# Patient Record
Sex: Male | Born: 1960 | Race: White | Hispanic: No | Marital: Married | Smoking: Former smoker
Health system: Southern US, Community
[De-identification: ages and names within clinical notes are randomized; demographics above are authoritative.]

## PROBLEM LIST (undated history)

## (undated) DIAGNOSIS — E785 Hyperlipidemia, unspecified: Secondary | ICD-10-CM

## (undated) DIAGNOSIS — Z9049 Acquired absence of other specified parts of digestive tract: Secondary | ICD-10-CM

## (undated) DIAGNOSIS — G894 Chronic pain syndrome: Secondary | ICD-10-CM

## (undated) DIAGNOSIS — K219 Gastro-esophageal reflux disease without esophagitis: Secondary | ICD-10-CM

## (undated) DIAGNOSIS — M51379 Other intervertebral disc degeneration, lumbosacral region without mention of lumbar back pain or lower extremity pain: Secondary | ICD-10-CM

## (undated) DIAGNOSIS — M1990 Unspecified osteoarthritis, unspecified site: Secondary | ICD-10-CM

## (undated) DIAGNOSIS — M5137 Other intervertebral disc degeneration, lumbosacral region: Secondary | ICD-10-CM

## (undated) DIAGNOSIS — G8918 Other acute postprocedural pain: Secondary | ICD-10-CM

## (undated) HISTORY — DX: Other intervertebral disc degeneration, lumbosacral region: M51.37

## (undated) HISTORY — DX: Hyperlipidemia, unspecified: E78.5

## (undated) HISTORY — DX: Acquired absence of other specified parts of digestive tract: Z90.49

## (undated) HISTORY — PX: APPENDECTOMY: SHX54

## (undated) HISTORY — DX: Unspecified osteoarthritis, unspecified site: M19.90

## (undated) HISTORY — PX: OTHER SURGICAL HISTORY: SHX169

## (undated) HISTORY — DX: Gastro-esophageal reflux disease without esophagitis: K21.9

## (undated) HISTORY — DX: Other intervertebral disc degeneration, lumbosacral region without mention of lumbar back pain or lower extremity pain: M51.379

## (undated) HISTORY — PX: SPINAL FUSION: SHX223

## (undated) HISTORY — DX: Other acute postprocedural pain: G89.18

## (undated) HISTORY — DX: Chronic pain syndrome: G89.4

## (undated) HISTORY — PX: SPINAL CORD STIMULATOR IMPLANT: SHX2422

---

## 1998-05-19 ENCOUNTER — Other Ambulatory Visit: Admission: RE | Admit: 1998-05-19 | Discharge: 1998-05-19 | Payer: Self-pay | Admitting: Gastroenterology

## 2000-03-07 ENCOUNTER — Inpatient Hospital Stay (HOSPITAL_COMMUNITY): Admission: RE | Admit: 2000-03-07 | Discharge: 2000-03-10 | Payer: Self-pay | Admitting: Neurosurgery

## 2000-03-07 ENCOUNTER — Encounter: Payer: Self-pay | Admitting: Neurosurgery

## 2000-04-06 ENCOUNTER — Ambulatory Visit (HOSPITAL_COMMUNITY): Admission: RE | Admit: 2000-04-06 | Discharge: 2000-04-06 | Payer: Self-pay | Admitting: Neurosurgery

## 2000-04-06 ENCOUNTER — Encounter: Payer: Self-pay | Admitting: Neurosurgery

## 2000-06-13 ENCOUNTER — Encounter: Payer: Self-pay | Admitting: Neurosurgery

## 2000-06-13 ENCOUNTER — Ambulatory Visit (HOSPITAL_COMMUNITY): Admission: RE | Admit: 2000-06-13 | Discharge: 2000-06-13 | Payer: Self-pay | Admitting: Neurosurgery

## 2000-08-22 ENCOUNTER — Ambulatory Visit (HOSPITAL_COMMUNITY): Admission: RE | Admit: 2000-08-22 | Discharge: 2000-08-22 | Payer: Self-pay | Admitting: Neurosurgery

## 2000-08-22 ENCOUNTER — Encounter: Payer: Self-pay | Admitting: Neurosurgery

## 2001-05-23 ENCOUNTER — Encounter: Payer: Self-pay | Admitting: Gastroenterology

## 2002-04-25 ENCOUNTER — Ambulatory Visit (HOSPITAL_COMMUNITY): Admission: RE | Admit: 2002-04-25 | Discharge: 2002-04-25 | Payer: Self-pay

## 2004-03-04 ENCOUNTER — Ambulatory Visit: Payer: Self-pay | Admitting: Family Medicine

## 2004-05-06 ENCOUNTER — Ambulatory Visit: Payer: Self-pay | Admitting: Family Medicine

## 2004-05-13 ENCOUNTER — Ambulatory Visit: Payer: Self-pay | Admitting: Family Medicine

## 2005-02-09 ENCOUNTER — Ambulatory Visit: Payer: Self-pay | Admitting: Family Medicine

## 2005-03-14 ENCOUNTER — Ambulatory Visit: Payer: Self-pay | Admitting: Family Medicine

## 2005-05-12 ENCOUNTER — Ambulatory Visit: Payer: Self-pay | Admitting: Family Medicine

## 2005-05-19 ENCOUNTER — Ambulatory Visit: Payer: Self-pay | Admitting: Family Medicine

## 2005-11-04 ENCOUNTER — Ambulatory Visit: Payer: Self-pay | Admitting: Family Medicine

## 2006-02-15 ENCOUNTER — Encounter (INDEPENDENT_AMBULATORY_CARE_PROVIDER_SITE_OTHER): Payer: Self-pay | Admitting: Specialist

## 2006-02-15 ENCOUNTER — Ambulatory Visit (HOSPITAL_BASED_OUTPATIENT_CLINIC_OR_DEPARTMENT_OTHER): Admission: RE | Admit: 2006-02-15 | Discharge: 2006-02-15 | Payer: Self-pay | Admitting: Otolaryngology

## 2006-06-07 ENCOUNTER — Ambulatory Visit: Payer: Self-pay | Admitting: Family Medicine

## 2006-06-07 LAB — CONVERTED CEMR LAB
ALT: 19 units/L (ref 0–40)
AST: 25 units/L (ref 0–37)
Albumin: 3.8 g/dL (ref 3.5–5.2)
Alkaline Phosphatase: 85 units/L (ref 39–117)
BUN: 19 mg/dL (ref 6–23)
Basophils Absolute: 0 10*3/uL (ref 0.0–0.1)
Basophils Relative: 0.6 % (ref 0.0–1.0)
Bilirubin, Direct: 0.1 mg/dL (ref 0.0–0.3)
CO2: 31 meq/L (ref 19–32)
Calcium: 9 mg/dL (ref 8.4–10.5)
Chloride: 103 meq/L (ref 96–112)
Cholesterol: 198 mg/dL (ref 0–200)
Creatinine, Ser: 0.8 mg/dL (ref 0.4–1.5)
Eosinophils Absolute: 0.1 10*3/uL (ref 0.0–0.6)
Eosinophils Relative: 3 % (ref 0.0–5.0)
GFR calc Af Amer: 135 mL/min
GFR calc non Af Amer: 112 mL/min
Glucose, Bld: 95 mg/dL (ref 70–99)
HCT: 37.5 % — ABNORMAL LOW (ref 39.0–52.0)
HDL: 35.2 mg/dL — ABNORMAL LOW (ref >39.0)
Hemoglobin: 13.1 g/dL (ref 13.0–17.0)
LDL Cholesterol: 137 mg/dL — ABNORMAL HIGH (ref 0–99)
Lymphocytes Relative: 28.4 % (ref 12.0–46.0)
MCHC: 35 g/dL (ref 30.0–36.0)
MCV: 83.1 fL (ref 78.0–100.0)
Monocytes Absolute: 0.3 10*3/uL (ref 0.2–0.7)
Monocytes Relative: 7.7 % (ref 3.0–11.0)
Neutro Abs: 2.5 10*3/uL (ref 1.4–7.7)
Neutrophils Relative %: 60.3 % (ref 43.0–77.0)
Platelets: 181 10*3/uL (ref 150–400)
Potassium: 3.6 meq/L (ref 3.5–5.1)
RBC: 4.51 M/uL (ref 4.22–5.81)
RDW: 12.9 % (ref 11.5–14.6)
Sodium: 142 meq/L (ref 135–145)
TSH: 2.9 microintl units/mL (ref 0.35–5.50)
Total Bilirubin: 0.6 mg/dL (ref 0.3–1.2)
Total CHOL/HDL Ratio: 5.6
Total Protein: 6.5 g/dL (ref 6.0–8.3)
Triglycerides: 127 mg/dL (ref 0–149)
VLDL: 25 mg/dL (ref 0–40)
WBC: 4.1 10*3/uL — ABNORMAL LOW (ref 4.5–10.5)

## 2006-06-12 ENCOUNTER — Ambulatory Visit: Payer: Self-pay | Admitting: Family Medicine

## 2006-09-07 ENCOUNTER — Encounter: Admission: RE | Admit: 2006-09-07 | Discharge: 2006-09-07 | Payer: Self-pay | Admitting: Otolaryngology

## 2007-03-07 ENCOUNTER — Ambulatory Visit: Payer: Self-pay | Admitting: Gastroenterology

## 2007-03-13 ENCOUNTER — Ambulatory Visit (HOSPITAL_COMMUNITY): Admission: RE | Admit: 2007-03-13 | Discharge: 2007-03-13 | Payer: Self-pay | Admitting: Gastroenterology

## 2007-03-16 ENCOUNTER — Ambulatory Visit: Payer: Self-pay | Admitting: Gastroenterology

## 2007-03-27 DIAGNOSIS — G8929 Other chronic pain: Secondary | ICD-10-CM | POA: Insufficient documentation

## 2007-03-27 DIAGNOSIS — K219 Gastro-esophageal reflux disease without esophagitis: Secondary | ICD-10-CM | POA: Insufficient documentation

## 2007-03-27 DIAGNOSIS — R1310 Dysphagia, unspecified: Secondary | ICD-10-CM | POA: Insufficient documentation

## 2007-03-27 DIAGNOSIS — M5442 Lumbago with sciatica, left side: Secondary | ICD-10-CM

## 2007-03-27 DIAGNOSIS — J309 Allergic rhinitis, unspecified: Secondary | ICD-10-CM | POA: Insufficient documentation

## 2007-03-27 DIAGNOSIS — M545 Low back pain, unspecified: Secondary | ICD-10-CM | POA: Insufficient documentation

## 2007-04-30 DIAGNOSIS — J069 Acute upper respiratory infection, unspecified: Secondary | ICD-10-CM | POA: Insufficient documentation

## 2007-05-04 ENCOUNTER — Ambulatory Visit: Payer: Self-pay | Admitting: Family Medicine

## 2007-11-20 ENCOUNTER — Ambulatory Visit: Payer: Self-pay | Admitting: Family Medicine

## 2007-11-20 DIAGNOSIS — L259 Unspecified contact dermatitis, unspecified cause: Secondary | ICD-10-CM | POA: Insufficient documentation

## 2007-12-07 ENCOUNTER — Telehealth: Payer: Self-pay | Admitting: Family Medicine

## 2007-12-24 ENCOUNTER — Ambulatory Visit: Payer: Self-pay | Admitting: Family Medicine

## 2007-12-24 LAB — CONVERTED CEMR LAB
ALT: 16 units/L (ref 0–53)
AST: 19 units/L (ref 0–37)
Albumin: 4.2 g/dL (ref 3.5–5.2)
Alkaline Phosphatase: 74 units/L (ref 39–117)
BUN: 24 mg/dL — ABNORMAL HIGH (ref 6–23)
Basophils Absolute: 0 10*3/uL (ref 0.0–0.1)
Basophils Relative: 0.7 % (ref 0.0–3.0)
Bilirubin Urine: NEGATIVE
Bilirubin, Direct: 0.1 mg/dL (ref 0.0–0.3)
Blood in Urine, dipstick: NEGATIVE
CO2: 34 meq/L — ABNORMAL HIGH (ref 19–32)
Calcium: 9.1 mg/dL (ref 8.4–10.5)
Chloride: 110 meq/L (ref 96–112)
Cholesterol: 218 mg/dL (ref 0–200)
Creatinine, Ser: 0.9 mg/dL (ref 0.4–1.5)
Direct LDL: 172.8 mg/dL
Eosinophils Absolute: 0.1 10*3/uL (ref 0.0–0.7)
Eosinophils Relative: 2 % (ref 0.0–5.0)
GFR calc Af Amer: 117 mL/min
GFR calc non Af Amer: 97 mL/min
Glucose, Bld: 101 mg/dL — ABNORMAL HIGH (ref 70–99)
Glucose, Urine, Semiquant: NEGATIVE
HCT: 41.2 % (ref 39.0–52.0)
HDL: 43.9 mg/dL (ref >39.0)
Hemoglobin: 14 g/dL (ref 13.0–17.0)
Lymphocytes Relative: 31.2 % (ref 12.0–46.0)
MCHC: 34 g/dL (ref 30.0–36.0)
MCV: 89.1 fL (ref 78.0–100.0)
Monocytes Absolute: 0.2 10*3/uL (ref 0.1–1.0)
Monocytes Relative: 5.2 % (ref 3.0–12.0)
Neutro Abs: 2.8 10*3/uL (ref 1.4–7.7)
Neutrophils Relative %: 60.9 % (ref 43.0–77.0)
Nitrite: NEGATIVE
Platelets: 225 10*3/uL (ref 150–400)
Potassium: 3.9 meq/L (ref 3.5–5.1)
Protein, U semiquant: NEGATIVE
RBC: 4.62 M/uL (ref 4.22–5.81)
RDW: 13.3 % (ref 11.5–14.6)
Sodium: 146 meq/L — ABNORMAL HIGH (ref 135–145)
Specific Gravity, Urine: 1.025
TSH: 3.73 microintl units/mL (ref 0.35–5.50)
Total Bilirubin: 0.4 mg/dL (ref 0.3–1.2)
Total CHOL/HDL Ratio: 5
Total Protein: 7 g/dL (ref 6.0–8.3)
Triglycerides: 88 mg/dL (ref 0–149)
Urobilinogen, UA: 0.2
VLDL: 18 mg/dL (ref 0–40)
WBC Urine, dipstick: NEGATIVE
WBC: 4.5 10*3/uL (ref 4.5–10.5)
pH: 5.5

## 2008-01-02 ENCOUNTER — Ambulatory Visit: Payer: Self-pay | Admitting: Family Medicine

## 2008-01-02 DIAGNOSIS — E785 Hyperlipidemia, unspecified: Secondary | ICD-10-CM | POA: Insufficient documentation

## 2008-01-14 ENCOUNTER — Telehealth: Payer: Self-pay | Admitting: *Deleted

## 2008-02-25 ENCOUNTER — Ambulatory Visit: Payer: Self-pay | Admitting: Family Medicine

## 2008-02-25 LAB — CONVERTED CEMR LAB
ALT: 23 U/L (ref 0–53)
AST: 26 U/L (ref 0–37)
Albumin: 4.1 g/dL (ref 3.5–5.2)
Alkaline Phosphatase: 61 U/L (ref 39–117)
Bilirubin, Direct: 0.1 mg/dL (ref 0.0–0.3)
Cholesterol: 148 mg/dL (ref 0–200)
HDL: 32.3 mg/dL — ABNORMAL LOW (ref >39.0)
LDL Cholesterol: 82 mg/dL (ref 0–99)
Total Bilirubin: 0.5 mg/dL (ref 0.3–1.2)
Total CHOL/HDL Ratio: 4.6
Total Protein: 6.7 g/dL (ref 6.0–8.3)
Triglycerides: 170 mg/dL — ABNORMAL HIGH (ref 0–149)
VLDL: 34 mg/dL (ref 0–40)

## 2008-03-03 ENCOUNTER — Ambulatory Visit: Payer: Self-pay | Admitting: Family Medicine

## 2008-08-11 ENCOUNTER — Emergency Department (HOSPITAL_COMMUNITY): Admission: EM | Admit: 2008-08-11 | Discharge: 2008-08-11 | Payer: Self-pay | Admitting: Emergency Medicine

## 2008-08-14 ENCOUNTER — Ambulatory Visit: Payer: Self-pay | Admitting: Family Medicine

## 2009-01-09 ENCOUNTER — Ambulatory Visit: Payer: Self-pay | Admitting: Family Medicine

## 2009-01-09 LAB — CONVERTED CEMR LAB
ALT: 22 units/L (ref 0–53)
AST: 28 units/L (ref 0–37)
Albumin: 4.3 g/dL (ref 3.5–5.2)
Alkaline Phosphatase: 65 units/L (ref 39–117)
BUN: 18 mg/dL (ref 6–23)
Basophils Absolute: 0 10*3/uL (ref 0.0–0.1)
Basophils Relative: 0.5 % (ref 0.0–3.0)
Bilirubin Urine: NEGATIVE
Bilirubin, Direct: 0 mg/dL (ref 0.0–0.3)
Blood in Urine, dipstick: NEGATIVE
CO2: 30 meq/L (ref 19–32)
Calcium: 9.3 mg/dL (ref 8.4–10.5)
Chloride: 104 meq/L (ref 96–112)
Cholesterol: 132 mg/dL (ref 0–200)
Creatinine, Ser: 0.9 mg/dL (ref 0.4–1.5)
Eosinophils Absolute: 0.1 10*3/uL (ref 0.0–0.7)
Eosinophils Relative: 2.1 % (ref 0.0–5.0)
GFR calc non Af Amer: 96.08 mL/min (ref >60)
Glucose, Bld: 111 mg/dL — ABNORMAL HIGH (ref 70–99)
Glucose, Urine, Semiquant: NEGATIVE
HCT: 38.8 % — ABNORMAL LOW (ref 39.0–52.0)
HDL: 35.5 mg/dL — ABNORMAL LOW (ref >39.00)
Hemoglobin: 13.2 g/dL (ref 13.0–17.0)
LDL Cholesterol: 85 mg/dL (ref 0–99)
Lymphocytes Relative: 29.1 % (ref 12.0–46.0)
Lymphs Abs: 1.2 10*3/uL (ref 0.7–4.0)
MCHC: 33.9 g/dL (ref 30.0–36.0)
MCV: 86.9 fL (ref 78.0–100.0)
Monocytes Absolute: 0.3 10*3/uL (ref 0.1–1.0)
Monocytes Relative: 6.9 % (ref 3.0–12.0)
Neutro Abs: 2.4 10*3/uL (ref 1.4–7.7)
Neutrophils Relative %: 61.4 % (ref 43.0–77.0)
Nitrite: NEGATIVE
Platelets: 168 10*3/uL (ref 150.0–400.0)
Potassium: 4.5 meq/L (ref 3.5–5.1)
Protein, U semiquant: NEGATIVE
RBC: 4.47 M/uL (ref 4.22–5.81)
RDW: 12.8 % (ref 11.5–14.6)
Sodium: 142 meq/L (ref 135–145)
Specific Gravity, Urine: 1.02
TSH: 1.04 microintl units/mL (ref 0.35–5.50)
Total Bilirubin: 0.7 mg/dL (ref 0.3–1.2)
Total CHOL/HDL Ratio: 4
Total Protein: 6.9 g/dL (ref 6.0–8.3)
Triglycerides: 60 mg/dL (ref 0.0–149.0)
Urobilinogen, UA: 1
VLDL: 12 mg/dL (ref 0.0–40.0)
WBC Urine, dipstick: NEGATIVE
WBC: 4 10*3/uL — ABNORMAL LOW (ref 4.5–10.5)
pH: 6.5

## 2009-01-16 ENCOUNTER — Ambulatory Visit: Payer: Self-pay | Admitting: Family Medicine

## 2009-01-22 ENCOUNTER — Telehealth: Payer: Self-pay | Admitting: Family Medicine

## 2010-01-12 ENCOUNTER — Ambulatory Visit: Payer: Self-pay | Admitting: Family Medicine

## 2010-01-12 LAB — CONVERTED CEMR LAB
ALT: 17 units/L (ref 0–53)
AST: 22 units/L (ref 0–37)
Albumin: 4.2 g/dL (ref 3.5–5.2)
Alkaline Phosphatase: 69 units/L (ref 39–117)
BUN: 18 mg/dL (ref 6–23)
Basophils Absolute: 0 10*3/uL (ref 0.0–0.1)
Basophils Relative: 0.8 % (ref 0.0–3.0)
Bilirubin Urine: NEGATIVE
Bilirubin, Direct: 0.1 mg/dL (ref 0.0–0.3)
Blood in Urine, dipstick: NEGATIVE
CO2: 32 meq/L (ref 19–32)
Calcium: 9.4 mg/dL (ref 8.4–10.5)
Chloride: 106 meq/L (ref 96–112)
Cholesterol: 178 mg/dL (ref 0–200)
Creatinine, Ser: 0.8 mg/dL (ref 0.4–1.5)
Eosinophils Absolute: 0.1 10*3/uL (ref 0.0–0.7)
Eosinophils Relative: 2.1 % (ref 0.0–5.0)
GFR calc non Af Amer: 103.59 mL/min (ref >60)
Glucose, Bld: 96 mg/dL (ref 70–99)
Glucose, Urine, Semiquant: NEGATIVE
HCT: 39 % (ref 39.0–52.0)
HDL: 35.7 mg/dL — ABNORMAL LOW (ref >39.00)
Hemoglobin: 13.3 g/dL (ref 13.0–17.0)
LDL Cholesterol: 128 mg/dL — ABNORMAL HIGH (ref 0–99)
Lymphocytes Relative: 31.3 % (ref 12.0–46.0)
Lymphs Abs: 1.1 10*3/uL (ref 0.7–4.0)
MCHC: 34.2 g/dL (ref 30.0–36.0)
MCV: 86.9 fL (ref 78.0–100.0)
Monocytes Absolute: 0.3 10*3/uL (ref 0.1–1.0)
Monocytes Relative: 8.1 % (ref 3.0–12.0)
Neutro Abs: 2.1 10*3/uL (ref 1.4–7.7)
Neutrophils Relative %: 57.7 % (ref 43.0–77.0)
Nitrite: NEGATIVE
PSA: 0.52 ng/mL (ref 0.10–4.00)
Platelets: 171 10*3/uL (ref 150.0–400.0)
Potassium: 4.3 meq/L (ref 3.5–5.1)
Protein, U semiquant: NEGATIVE
RBC: 4.48 M/uL (ref 4.22–5.81)
RDW: 13.7 % (ref 11.5–14.6)
Sodium: 142 meq/L (ref 135–145)
Specific Gravity, Urine: 1.025
TSH: 1.26 microintl units/mL (ref 0.35–5.50)
Total Bilirubin: 0.5 mg/dL (ref 0.3–1.2)
Total CHOL/HDL Ratio: 5
Total Protein: 6.5 g/dL (ref 6.0–8.3)
Triglycerides: 73 mg/dL (ref 0.0–149.0)
Urobilinogen, UA: 0.2
VLDL: 14.6 mg/dL (ref 0.0–40.0)
WBC Urine, dipstick: NEGATIVE
WBC: 3.6 10*3/uL — ABNORMAL LOW (ref 4.5–10.5)
pH: 6.5

## 2010-01-18 ENCOUNTER — Encounter: Payer: Self-pay | Admitting: Family Medicine

## 2010-01-18 ENCOUNTER — Ambulatory Visit: Payer: Self-pay | Admitting: Family Medicine

## 2010-04-29 NOTE — Assessment & Plan Note (Signed)
Summary: gout in toe/mhf   Vital Signs:  Patient profile:   50 year old male Height:      71 inches Weight:      230 pounds BMI:     32.19 Temp:     98.1 degrees F oral BP sitting:   110 / 80  (left arm) Cuff size:   regular  Vitals Entered By: Kern Reap CMA (Aug 14, 2008 12:41 PM)  Reason for Visit gout  History of Present Illness: Lance Roman is a 50 y/o male  who is evaluation of pain in his left great toe.  On May 13 he awoke with pain in his left great toe.  He thinks he stubbed it during the night.  By May, the 18th his pain was worse he couldn't stand the sheets to touch.  It.  A subsequent to urgent care x-rays show no fracture.  Uric acid level was drawn, which comes back 6.4.  He was given indomethacin 50 mg 3 times a day with food and says his pain is about 50 to 75% diminished.  Allergies (verified): No Known Drug Allergies  Past History:  Past medical, surgical, family and social histories (including risk factors) reviewed, and no changes noted (except as noted below).  Past Medical History:    Reviewed history from 01/02/2008 and no changes required:    chronic pain syndrome    osteoarthritis    allergic rhinitis    gerd    appendectomy    lumbosacral degenerative disease    ENT surgery, benign polyp, right maxillary sinus    Hyperlipidemia  Past Surgical History:    Reviewed history from 11/20/2007 and no changes required:    Appendectomy    repairs of bilateral ankle fractures  Family History:    Reviewed history from 05/04/2007 and no changes required:       father and mother both alive and well       two brothers in good health.  No problems  Social History:    Reviewed history from 05/04/2007 and no changes required:       Occupation:       Married       Former Smoker       Alcohol use-no       Drug use-no       Regular exercise-no  Review of Systems      See HPI  Physical Exam  General:  Well-developed,well-nourished,in no acute  distress; alert,appropriate and cooperative throughout examination Msk:  exam normal, except for swelling and redness proximal joint, left great Pulses:  R and L carotid,radial,femoral,dorsalis pedis and posterior tibial pulses are full and equal bilaterally Extremities:  No clubbing, cyanosis, edema, or deformity noted with normal full range of motion of all joints.   Neurologic:  No cranial nerve deficits noted. Station and gait are normal. Plantar reflexes are down-going bilaterally. DTRs are symmetrical throughout. Sensory, motor and coordinative functions appear intact.   Impression & Recommendations:  Problem # 1:  GOUT, UNSPECIFIED (ICD-274.9) Assessment New  Orders: Prescription Created Electronically [id number]  Complete Medication List: 1)  Nexium 40 Mg Cpdr (Esomeprazole magnesium) .... Once daily 2)  Duragesic-75 75 Mcg/hr Pt72 (Fentanyl) .... As dir 3)  Oxyir 5 Mg Caps (Oxycodone hcl) .... Two q8h 4)  Celebrex 200 Mg Caps (Celecoxib) .... Two times a day 5)  Zyrtec Allergy 10 Mg Tabs (Cetirizine hcl) 6)  Claritin-d 12 Hour 5-120 Mg Xr12h-tab (Loratadine-pseudoephedrine) 7)  Flonase 50 Mcg/act Susp (Fluticasone propionate)  8)  Prilosec 10 Mg Cpdr (Omeprazole) 9)  Zocor 20 Mg Tabs (Simvastatin) .Marland Kitchen.. 1 tab @ bedtime 10)  Zovirax 200 Mg Caps (Acyclovir) .... By mouth every 4 hours for ten days for first episode and one tab for  5 days for recurrent episodes 11)  Indomethacin 50 Mg Caps (Indomethacin) .... Take 1 tablet by mouth three times a day  Patient Instructions: 1)   continue the Indocin 3 times a day with food, and to the pain completely goes away.  Return p.r.n. Prescriptions: INDOMETHACIN 50 MG CAPS (INDOMETHACIN) Take 1 tablet by mouth three times a day  #50 x 1   Entered and Authorized by:   Roderick Pee MD   Signed by:   Roderick Pee MD on 08/14/2008   Method used:   [facility] to        [facility] [address] [phone]* (retail)       [address]        [place], [place]  [address]       Ph: [phone] or [phone]       Fax: [phone]   RxID:   [id number]

## 2010-04-29 NOTE — Procedures (Signed)
Summary: Gastroenterology EGD  Gastroenterology EGD   Imported By: Darcey Nora RN 04/06/2007 08:04:14  _____________________________________________________________________  External Attachment:    Type:   Image     Comment:   External Document

## 2010-04-29 NOTE — Progress Notes (Signed)
Summary: rx  Phone Note Call from Patient Call back at [phone]   Caller: pt live Call For: [facility] Summary of Call: forgot to rx for zovriax.  Please print rx and he will mail in.   Initial call taken by: Roselle Locus,  January 14, 2008 8:38 AM  Follow-up for Phone Call        rx ready left message on machine for patient  Follow-up by: Kern Reap CMA,  January 14, 2008 10:01 AM    New/Updated Medications: ZOVIRAX 200 MG CAPS (ACYCLOVIR) by mouth every 4 hours for ten days for first episode and one tab for  5 days for recurrent episodes   Prescriptions: ZOVIRAX 200 MG CAPS (ACYCLOVIR) by mouth every 4 hours for ten days for first episode and one tab for  5 days for recurrent episodes  #90 x 3   Entered by:   Kern Reap CMA   Authorized by:   Roderick Pee MD   Signed by:   Kern Reap CMA on 01/14/2008   Method used:   Print then Give to Patient   RxID:   [id number]

## 2010-04-29 NOTE — Assessment & Plan Note (Signed)
Summary: cpx/njr   Vital Signs:  Patient Profile:   50 Years Old Male Height:     71 inches Weight:      236 pounds Temp:     98.3 degrees F oral Pulse rate:   64 / minute Pulse rhythm:   regular BP sitting:   120 / 80  (left arm) Cuff size:   regular  Vitals Entered By: Kern Reap CMA (January 02, 2008 9:45 AM)                 Chief Complaint:  cpx.  History of Present Illness: Lance Roman is a 50 year old male, who comes in today for physical examination because of underlying allergic rhinitis.  Reflux esophagitis.  Last year.  He had ENT surgery by Dr. Pollyann Kennedy.  They found a polyp in his right maxillary sinus.  Since that time.  He still has allergy symptoms sneezing et Karie Soda.  He has no asthma.  This week.  He is getting rid of his wood stove and getting central heat and air conditioning.  This may have been a big contributor to his allergic rhinitis.  He would also like to see an allergist for further examination  Incursion to wait until the central system is in for a couple months  he also has reflux esophagitis two with Nexium 40 mg daily.  He has chronic pain from her previous back surgery and gets his pain medications at the pain clinic.  Last tetanus booster was 2004, we will give him a flu shot today    Current Allergies: No known allergies   Past Medical History:    Reviewed history from 11/20/2007 and no changes required:       chronic pain syndrome       osteoarthritis       allergic rhinitis       gerd       appendectomy       lumbosacral degenerative disease       ENT surgery, benign polyp, right maxillary sinus       Hyperlipidemia   Family History:    Reviewed history from 05/04/2007 and no changes required:       father and mother both alive and well       two brothers in good health.  No problems  Social History:    Reviewed history from 05/04/2007 and no changes required:       Occupation:       Married       Former Smoker       Alcohol  use-no       Drug use-no       Regular exercise-no    Review of Systems      See HPI   Physical Exam  General:     Well-developed,well-nourished,in no acute distress; alert,appropriate and cooperative throughout examination Head:     Normocephalic and atraumatic without obvious abnormalities. No apparent alopecia or balding. Eyes:     No corneal or conjunctival inflammation noted. EOMI. Perrla. Funduscopic exam benign, without hemorrhages, exudates or papilledema. Vision grossly normal. Ears:     External ear exam shows no significant lesions or deformities.  Otoscopic examination reveals clear canals, tympanic membranes are intact bilaterally without bulging, retraction, inflammation or discharge. Hearing is grossly normal bilaterally. Nose:     External nasal examination shows no deformity or inflammation. Nasal mucosa are pink and moist without lesions or exudates. Mouth:     Oral mucosa and oropharynx without  lesions or exudates.  Teeth in good repair. Neck:     No deformities, masses, or tenderness noted. Chest Wall:     No deformities, masses, tenderness or gynecomastia noted. Breasts:     No masses or gynecomastia noted Lungs:     Normal respiratory effort, chest expands symmetrically. Lungs are clear to auscultation, no crackles or wheezes. Heart:     Normal rate and regular rhythm. S1 and S2 normal without gallop, murmur, click, rub or other extra sounds. Abdomen:     Bowel sounds positive,abdomen soft and non-tender without masses, organomegaly or hernias noted. Rectal:     No external abnormalities noted. Normal sphincter tone. No rectal masses or tenderness. Genitalia:     Testes bilaterally descended without nodularity, tenderness or masses. No scrotal masses or lesions. No penis lesions or urethral discharge. Prostate:     Prostate gland firm and smooth, no enlargement, nodularity, tenderness, mass, asymmetry or induration. Msk:     No deformity or scoliosis  noted of thoracic or lumbar spine.   Pulses:     R and L carotid,radial,femoral,dorsalis pedis and posterior tibial pulses are full and equal bilaterally Extremities:     No clubbing, cyanosis, edema, or deformity noted with normal full range of motion of all joints.   Neurologic:     No cranial nerve deficits noted. Station and gait are normal. Plantar reflexes are down-going bilaterally. DTRs are symmetrical throughout. Sensory, motor and coordinative functions appear intact. Skin:     Intact without suspicious lesions or rashes Cervical Nodes:     No lymphadenopathy noted Axillary Nodes:     No palpable lymphadenopathy Inguinal Nodes:     No significant adenopathy Psych:     Cognition and judgment appear intact. Alert and cooperative with normal attention span and concentration. No apparent delusions, illusions, hallucinations    Impression & Recommendations:  Problem # 1:  ALLERGIC RHINITIS (ICD-477.9) Assessment: Unchanged         Flu Vaccine Consent Questions     Do you have a history of severe allergic reactions to this vaccine? no    Any prior history of allergic reactions to egg and/or gelatin? no    Do you have a sensitivity to the preservative Thimersol? no    Do you have a past history of Guillan-Barre Syndrome? no    Do you currently have an acute febrile illness? no    Have you ever had a severe reaction to latex? no    Vaccine information given and explained to patient? yes    Are you currently pregnant? no    Lot Number:[id number]   Site Given  Right  Deltoid IM  His updated medication list for this problem includes:    Zyrtec Allergy 10 Mg Tabs (Cetirizine hcl)    Flonase 50 Mcg/act Susp (Fluticasone propionate)   Problem # 2:  GERD (ICD-530.81) Assessment: Improved  His updated medication list for this problem includes:    Nexium 40 Mg Cpdr (Esomeprazole magnesium) ..... Once daily    Prilosec 10 Mg Cpdr (Omeprazole)   Problem # 3:  LOW BACK  PAIN, CHRONIC (ICD-724.2) Assessment: Unchanged  His updated medication list for this problem includes:    Duragesic-75 75 Mcg/hr Pt72 (Fentanyl) .Marland Kitchen... As dir    Oxyir 5 Mg Caps (Oxycodone hcl) .Marland Kitchen..Marland Kitchen Two q8h    Celebrex 200 Mg Caps (Celecoxib) .Marland Kitchen..Marland Kitchen Two times a day   Problem # 4:  HYPERLIPIDEMIA (ICD-272.4) Assessment: New  His updated medication list for this  problem includes:    Zocor 20 Mg Tabs (Simvastatin) .Marland Kitchen... 1 tab @ bedtime   Complete Medication List: 1)  Nexium 40 Mg Cpdr (Esomeprazole magnesium) .... Once daily 2)  Duragesic-75 75 Mcg/hr Pt72 (Fentanyl) .... As dir 3)  Oxyir 5 Mg Caps (Oxycodone hcl) .... Two q8h 4)  Celebrex 200 Mg Caps (Celecoxib) .... Two times a day 5)  Zyrtec Allergy 10 Mg Tabs (Cetirizine hcl) 6)  Claritin-d 12 Hour 5-120 Mg Xr12h-tab (Loratadine-pseudoephedrine) 7)  Flonase 50 Mcg/act Susp (Fluticasone propionate) 8)  Prilosec 10 Mg Cpdr (Omeprazole) 9)  Zocor 20 Mg Tabs (Simvastatin) .Marland Kitchen.. 1 tab @ bedtime  Other Orders: Admin 1st Vaccine (16109) Flu Vaccine 73yrs + (60454) EKG w/ Interpretation (93000)   Patient Instructions: 1)  Please schedule a follow-up appointment in 1 year. 2)  Take an Aspirin every day. 3)  [facility] resource 4)  takes Zocor 20 mg a day at bedtime, and a aspirin tablet. 5)  Please schedule a follow-up appointment in 2 months. 6)  Hepatic Panel prior to visit, ICD-9: 7)  Lipid Panel prior to visit, ICD-9:   Prescriptions: FLONASE 50 MCG/ACT SUSP (FLUTICASONE PROPIONATE)   #3 x 3   Entered and Authorized by:   Roderick Pee MD   Signed by:   Roderick Pee MD on 01/02/2008   Method used:   Print then Give to Patient   RxID:   [id number] NEXIUM 40 MG  CPDR (ESOMEPRAZOLE MAGNESIUM) once daily  #100 x 3   Entered and Authorized by:   Roderick Pee MD   Signed by:   Roderick Pee MD on 01/02/2008   Method used:   Print then Give to Patient    RxID:   [id number] ZOCOR 20 MG TABS (SIMVASTATIN) 1 tab @ bedtime  #100 x 3   Entered and Authorized by:   Roderick Pee MD   Signed by:   Roderick Pee MD on 01/02/2008   Method used:   Print then Give to Patient   RxID:   [id number] NEXIUM 40 MG  CPDR (ESOMEPRAZOLE MAGNESIUM) once daily  #100 x 3   Entered and Authorized by:   Roderick Pee MD   Signed by:   Roderick Pee MD on 01/02/2008   Method used:   [facility]        [facility] [address] [phone]* (retail)       [address]       [place], [place]  [address]       Ph: [phone] or [phone]       Fax: [phone]   RxID:   [id number]  ]  Tetanus/Td Immunization History:    Tetanus/Td # 1:  Historical (01/27/2003)

## 2010-04-29 NOTE — Assessment & Plan Note (Signed)
Summary: sinuses/ccm  Medications Added NEXIUM 40 MG  CPDR (ESOMEPRAZOLE MAGNESIUM) once daily DURAGESIC-75 75 MCG/HR  PT72 (FENTANYL) as dir OXYIR 5 MG  CAPS (OXYCODONE HCL) two q8h CELEBREX 200 MG  CAPS (CELECOXIB) two times a day HYCODAN 5-1.5 MG/5ML  SYRP (HYDROCODONE-HOMATROPINE) 1 or 2 tsps. at bedtime      Allergies Added: NKDA  Vital Signs:  Patient Profile:   50 Years Old Male Weight:      238 pounds Temp:     98.6 degrees F BP sitting:   126 / 80  Vitals Entered By: Sindy Guadeloupe RN (May 04, 2007 9:27 AM)                 Chief Complaint:  head congestion x 2weeks--ears stoopping up.  History of Present Illness: Lance Roman is a 50 year old male, who comes in today with a 3 day history of head congestion, runny nose, and nonproductive cough.  He said no, fever, chills, nausea, and little diarrhea.  Last night, but it stopped.  He has stopped his Zyrtec and nasal spray.  Current Allergies (reviewed today): No known allergies   Past Medical History:    Reviewed history and no changes required:       chronic pain syndrome       osteoarthritis       allergic rhinitis       gerd       appendectomy       lumbosacral degenerative disease       fractured left and right ankle surgical repair   Family History:    Reviewed history and no changes required:       father and mother both alive and well       two brothers in good health.  No problems  Social History:    Reviewed history and no changes required:       Occupation:       Married       Former Smoker       Alcohol use-no       Drug use-no       Regular exercise-no   Risk Factors:  Tobacco use:  quit    Year quit:  97 Passive smoke exposure:  no Drug use:  no Alcohol use:  no Exercise:  no   Review of Systems      See HPI   Physical Exam  General:     Well-developed,well-nourished,in no acute distress; alert,appropriate and cooperative throughout examination Head:     Normocephalic  and atraumatic without obvious abnormalities. No apparent alopecia or balding. Eyes:     No corneal or conjunctival inflammation noted. EOMI. Perrla. Funduscopic exam benign, without hemorrhages, exudates or papilledema. Vision grossly normal. Ears:     External ear exam shows no significant lesions or deformities.  Otoscopic examination reveals clear canals, tympanic membranes are intact bilaterally without bulging, retraction, inflammation or discharge. Hearing is grossly normal bilaterally. Nose:     External nasal examination shows no deformity or inflammation. Nasal mucosa are pink and moist without lesions or exudates. Mouth:     Oral mucosa and oropharynx without lesions or exudates.  Teeth in good repair. Neck:     No deformities, masses, or tenderness noted. Lungs:     Normal respiratory effort, chest expands symmetrically. Lungs are clear to auscultation, no crackles or wheezes.    Impression & Recommendations:  Problem # 1:  VIRAL URI (ICD-465.9) Assessment: New  His updated medication list for  this problem includes:    Celebrex 200 Mg Caps (Celecoxib) .Marland Kitchen..Marland Kitchen Two times a day    Hycodan 5-1.5 Mg/63ml Syrp (Hydrocodone-homatropine) .Marland Kitchen... 1 or 2 tsps. at bedtime   Complete Medication List: 1)  Nexium 40 Mg Cpdr (Esomeprazole magnesium) .... Once daily 2)  Duragesic-75 75 Mcg/hr Pt72 (Fentanyl) .... As dir 3)  Oxyir 5 Mg Caps (Oxycodone hcl) .... Two q8h 4)  Celebrex 200 Mg Caps (Celecoxib) .... Two times a day 5)  Hycodan 5-1.5 Mg/3ml Syrp (Hydrocodone-homatropine) .Marland Kitchen.. 1 or 2 tsps. at bedtime   Patient Instructions: 1)  drinks 40 ounces of water a day, when a vaporizer or humidifier in her bedroom at night, take one or 2 teaspoons of the Hycodan cough syrup at bedtime.  You might also want to restart the Flonase nasal spray because of your underlying allergic rhinitis.    Prescriptions: HYCODAN 5-1.5 MG/5ML  SYRP (HYDROCODONE-HOMATROPINE) 1 or 2 tsps. at bedtime  #8 0z.  x 1   Entered and Authorized by:   Roderick Pee MD   Signed by:   Roderick Pee MD on 05/04/2007   Method used:   Print then Give to Patient   RxID:   [id number]  ]

## 2010-04-29 NOTE — Assessment & Plan Note (Signed)
Summary: cpx/njr   Vital Signs:  Patient profile:   50 year old male Height:      69.25 inches Weight:      237 pounds Temp:     98.3 degrees F oral BP sitting:   120 / 80  (left arm) Cuff size:   regular  Vitals Entered By: Kern Reap CMA (AAMA) (January 16, 2009 4:00 PM)  Reason for Visit cpx  History of Present Illness:  Lance Roman is a 50 year old male, nonsmoker, who comes in today for evaluation of multiple issues. he takes Nexium 40 mg daily for reflux esophagitis doing well.  He uses Zyrtec, and steroid nasal spray, and two other nasal sprays via allergy and immunology for his allergic rhinitis, but it doesn't seem to help.  He said surgery by Dr. Pollyann Kennedy at [facility] per [facility].  He continues to have sinus drainage.  He takes Zocor 20 mg nightly for hyperlipidemia with this, our goal with an LDL of 85.  No side effects from medication.  He takes acyclovir 200 mg q.i.d., p.r.n.  His pain medications, which are Duragesic patches, and C5 milligrams two tabs nightly and Celebrex are given to him by the pain clinic.  He continues to have soreness in his left great toe.  He taken 3 rounds of indomethacin if not resolved.  The problem.  Tetanus booster 2004, which 2010.  Allergies: No Known Drug Allergies  Past History:  Past medical, surgical, family and social histories (including risk factors) reviewed, and no changes noted (except as noted below).  Past Medical History: Reviewed history from 01/02/2008 and no changes required. chronic pain syndrome osteoarthritis allergic rhinitis gerd appendectomy lumbosacral degenerative disease ENT surgery, benign polyp, right maxillary sinus Hyperlipidemia  Past Surgical History: Reviewed history from 11/20/2007 and no changes required. Appendectomy repairs of bilateral ankle fractures  Family History: Reviewed history from 05/04/2007 and no changes required. father and mother both alive and well two brothers in good health.  No  problems  Social History: Reviewed history from 05/04/2007 and no changes required. Occupation: Married Former Smoker Alcohol use-no Drug use-no Regular exercise-no  Review of Systems      See HPI  Physical Exam  General:  Well-developed,well-nourished,in no acute distress; alert,appropriate and cooperative throughout examination Head:  Normocephalic and atraumatic without obvious abnormalities. No apparent alopecia or balding. Eyes:  No corneal or conjunctival inflammation noted. EOMI. Perrla. Funduscopic exam benign, without hemorrhages, exudates or papilledema. Vision grossly normal. Ears:  External ear exam shows no significant lesions or deformities.  Otoscopic examination reveals clear canals, tympanic membranes are intact bilaterally without bulging, retraction, inflammation or discharge. Hearing is grossly normal bilaterally. Nose:  External nasal examination shows no deformity or inflammation. Nasal mucosa are pink and moist without lesions or exudates. Mouth:  Oral mucosa and oropharynx without lesions or exudates.  Teeth in good repair. Neck:  No deformities, masses, or tenderness noted. Chest Wall:  No deformities, masses, tenderness or gynecomastia noted. Breasts:  No masses or gynecomastia noted Lungs:  Normal respiratory effort, chest expands symmetrically. Lungs are clear to auscultation, no crackles or wheezes. Heart:  Normal rate and regular rhythm. S1 and S2 normal without gallop, murmur, click, rub or other extra sounds. Abdomen:  Bowel sounds positive,abdomen soft and non-tender without masses, organomegaly or hernias noted. Rectal:  No external abnormalities noted. Normal sphincter tone. No rectal masses or tenderness. Genitalia:  Testes bilaterally descended without nodularity, tenderness or masses. No scrotal masses or lesions. No penis lesions or urethral  discharge. Prostate:  Prostate gland firm and smooth, no enlargement, nodularity, tenderness, mass,  asymmetry or induration. Msk:  No deformity or scoliosis noted of thoracic or lumbar spine.   Pulses:  R and L carotid,radial,femoral,dorsalis pedis and posterior tibial pulses are full and equal bilaterally Extremities:  No clubbing, cyanosis, edema, or deformity noted with normal full range of motion of all joints.   Neurologic:  No cranial nerve deficits noted. Station and gait are normal. Plantar reflexes are down-going bilaterally. DTRs are symmetrical throughout. Sensory, motor and coordinative functions appear intact. Skin:  Intact without suspicious lesions or rashes Cervical Nodes:  No lymphadenopathy noted Axillary Nodes:  No palpable lymphadenopathy Inguinal Nodes:  No significant adenopathy Psych:  Cognition and judgment appear intact. Alert and cooperative with normal attention span and concentration. No apparent delusions, illusions, hallucinations   Impression & Recommendations:  Problem # 1:  HYPERLIPIDEMIA (ICD-272.4) Assessment Improved  His updated medication list for this problem includes:    Zocor 20 Mg Tabs (Simvastatin) .Marland Kitchen... 1 tab @ bedtime  Orders: Prescription Created Electronically [id number])  Problem # 2:  HEALTH MAINTENANCE EXAM (ICD-V70.0) Assessment: Unchanged  Orders: Prescription Created Electronically [phone])  Problem # 3:  DYSPHAGIA UNSPECIFIED (ICD-787.20) Assessment: Improved  Problem # 4:  GERD (ICD-530.81) Assessment: Improved  The following medications were removed from the medication list:    Prilosec 10 Mg Cpdr (Omeprazole) His updated medication list for this problem includes:    Nexium 40 Mg Cpdr (Esomeprazole magnesium) ..... Once daily    Dexilant 60 Mg Cpdr (Dexlansoprazole) .Marland Kitchen... At bedtime  Orders: Prescription Created Electronically [phone])  Complete Medication List: 1)  Nexium 40 Mg Cpdr (Esomeprazole magnesium) .... Once daily 2)  Duragesic-75 75 Mcg/hr Pt72 (Fentanyl) .... As dir 3)  Oxyir 5 Mg Caps (Oxycodone hcl) ....  Two q8h 4)  Celebrex 200 Mg Caps (Celecoxib) .... Two times a day 5)  Zyrtec Allergy 10 Mg Tabs (Cetirizine hcl) 6)  Flonase 50 Mcg/act Susp (Fluticasone propionate) .... One spray per nostril two times a day 7)  Zocor 20 Mg Tabs (Simvastatin) .Marland Kitchen.. 1 tab @ bedtime 8)  Zovirax 200 Mg Caps (Acyclovir) .... By mouth every 4 hours for ten days for first episode and one tab for  5 days for recurrent episodes 9)  Indomethacin 50 Mg Caps (Indomethacin) .... Take 1 tablet by mouth three times a day 10)  Astepro 0.15 % Soln (Azelastine hcl) .... At bedtime 11)  Dexilant 60 Mg Cpdr (Dexlansoprazole) .... At bedtime 12)  Aspirin 325 Mg Tabs (Aspirin) .... At bedtime  Patient Instructions: 1)  Please schedule a follow-up appointment in 1 year 2)  call [facility] orthopedics, and make an appointment to see Dr. Dyanne Iha.  The foot specialist for evaluation of pain in that left great toe pain Prescriptions: FLONASE 50 MCG/ACT SUSP (FLUTICASONE PROPIONATE) one spray per nostril two times a day  #3 x 3   Entered by:   Kern Reap CMA (AAMA)   Authorized by:   Roderick Pee MD   Signed by:   Kern Reap CMA (AAMA) on 01/22/2009   Method used:   Electronically to        [facility]* (mail-order)       [address] [place], [place]  [address]       Ph: [phone]       Fax: [phone]   RxID:   [id number] ZOVIRAX 200 MG CAPS (ACYCLOVIR) by mouth every  4 hours for ten days for first episode and one tab for  5 days for recurrent episodes  #100 x 3   Entered and Authorized by:   Roderick Pee MD   Signed by:   Roderick Pee MD on 01/16/2009   Method used:   Print then Give to Patient   RxID:   [id number] ZOCOR 20 MG TABS (SIMVASTATIN) 1 tab @ bedtime  #100 x 3   Entered and Authorized by:   Roderick Pee MD   Signed by:   Roderick Pee MD on 01/16/2009   Method used:   Print then Give to Patient   RxID:   [id number] XBMWUXL 50 MCG/ACT SUSP (FLUTICASONE  PROPIONATE)   #3 x 3   Entered and Authorized by:   Roderick Pee MD   Signed by:   Roderick Pee MD on 01/16/2009   Method used:   Print then Give to Patient   RxID:   [id number] NEXIUM 40 MG  CPDR (ESOMEPRAZOLE MAGNESIUM) once daily  #100 x 3   Entered and Authorized by:   Roderick Pee MD   Signed by:   Roderick Pee MD on 01/16/2009   Method used:   Print then Give to Patient   RxID:   [id number]

## 2010-04-29 NOTE — Assessment & Plan Note (Signed)
Summary: poison ivy/mhf   Vital Signs:  Patient Profile:   50 Years Old Male Weight:      227 pounds Temp:     98.3 degrees F oral BP sitting:   114 / 66  (left arm) Cuff size:   large  Vitals Entered By: Alfred Levins, CMA (November 20, 2007 9:38 AM)                 Chief Complaint:  poison ivy.  History of Present Illness: 4 days ago developed an itchy rash on arms, legs, and neck. Using topical Benadryl.     Current Allergies (reviewed today): No known allergies   Past Medical History:    Reviewed history from 05/04/2007 and no changes required:       chronic pain syndrome       osteoarthritis       allergic rhinitis       gerd       appendectomy       lumbosacral degenerative disease  Past Surgical History:    Reviewed history from 03/27/2007 and no changes required:       Appendectomy       repairs of bilateral ankle fractures     Review of Systems  The patient denies anorexia, fever, weight loss, weight gain, vision loss, decreased hearing, hoarseness, chest pain, syncope, dyspnea on exertion, peripheral edema, prolonged cough, headaches, hemoptysis, abdominal pain, melena, hematochezia, severe indigestion/heartburn, hematuria, incontinence, genital sores, muscle weakness, suspicious skin lesions, transient blindness, difficulty walking, depression, unusual weight change, abnormal bleeding, enlarged lymph nodes, angioedema, breast masses, and testicular masses.     Physical Exam  General:     Well-developed,well-nourished,in no acute distress; alert,appropriate and cooperative throughout examination Skin:     red vessicular rash as above    Impression & Recommendations:  Problem # 1:  CONTACT DERMATITIS (ICD-692.9)  His updated medication list for this problem includes:    Sterapred Ds 12 Day 10 Mg Tabs (Prednisone) .Marland Kitchen... As directed  Orders: Depo- Medrol 80mg  (J1040) Admin of Therapeutic Inj  intramuscular or subcutaneous (98119)   Complete  Medication List: 1)  Nexium 40 Mg Cpdr (Esomeprazole magnesium) .... Once daily 2)  Duragesic-75 75 Mcg/hr Pt72 (Fentanyl) .... As dir 3)  Oxyir 5 Mg Caps (Oxycodone hcl) .... Two q8h 4)  Celebrex 200 Mg Caps (Celecoxib) .... Two times a day 5)  Hycodan 5-1.5 Mg/95ml Syrp (Hydrocodone-homatropine) .Marland Kitchen.. 1 or 2 tsps. at bedtime 6)  Sterapred Ds 12 Day 10 Mg Tabs (Prednisone) .... As directed   Patient Instructions: 1)  Please schedule a follow-up appointment as needed.   Prescriptions: STERAPRED DS 12 DAY 10 MG TABS (PREDNISONE) as directed  #1 x 0   Entered and Authorized by:   Nelwyn Salisbury MD   Signed by:   Alfred Levins, CMA on 11/20/2007   Method used:   Electronically to        [facility] [id number]* (retail)       [address]       [place], [place]  [address]       Ph: [phone] or [phone]       Fax: [phone]   RxID:   [id number]  ]  Medication Administration  Injection # 1:    Medication: Depo- Medrol 80mg     Diagnosis: CONTACT DERMATITIS (ICD-692.9)    Route: IM    Site: LUOQ gluteus    Exp  Date: 8/10    Lot #: [id number]    Mfr: Sicor    Patient tolerated injection without complications    Given by: Alfred Levins, CMA (November 20, 2007 10:44 AM)  Orders Added: 1)  Est. Patient Level III [75643] 2)  Depo- Medrol 80mg  [J1040] 3)  Admin of Therapeutic Inj  intramuscular or subcutaneous [32951]

## 2010-04-29 NOTE — Assessment & Plan Note (Signed)
Summary: CPX // RS   Vital Signs:  Patient profile:   50 year old male Height:      69 inches Weight:      236 pounds BMI:     34.98 Temp:     98.3 degrees F oral BP sitting:   124 / 84  (left arm) Cuff size:   regular  Vitals Entered By: Kern Reap CMA Duncan Dull) (January 18, 2010 3:02 PM) CC: cpx Is Patient Diabetic? No   CC:  cpx.  History of Present Illness: Lance Roman is a 50 year old, married male, nonsmoker employed full-time.  Also has chronic pain syndrome, treated at the pain clinic, who comes in today for general physical examination  He is a history of reflux esophagitis, for which he takes Nexium 40 mg daily.  He also has allergic rhinitis, for which he uses Zyrtec 10 mg nightly along with steroid nasal spray and antihistamine nasal spray.  The pain clinic now has him on Duragesic 75 as directed along with OxyContin 5 mg dose two tabs Q8 hours and Celebrex 200 mg b.i.d.  He also takes acyclovir p.r.n. indomethacin 50 mg t.i.d. and one aspirin tablet daily.  He gets routine eye care, dental care, he said foot surgeon.  His left foot.  Denies having more pain in that foot.  Referred to Dr. Toni Arthurs for further evaluation.  Tetanus 2004 seasonal flu shot 2011 at work  Allergies: No Known Drug Allergies  Past History:  Past medical, surgical, family and social histories (including risk factors) reviewed, and no changes noted (except as noted below).  Past Medical History: Reviewed history from 01/02/2008 and no changes required. chronic pain syndrome osteoarthritis allergic rhinitis gerd appendectomy lumbosacral degenerative disease ENT surgery, benign polyp, right maxillary sinus Hyperlipidemia  Past Surgical History: Reviewed history from 11/20/2007 and no changes required. Appendectomy repairs of bilateral ankle fractures  Family History: Reviewed history from 05/04/2007 and no changes required. father and mother both alive and well two brothers in good  health.  No problems  Social History: Reviewed history from 05/04/2007 and no changes required. Occupation: Married Former Smoker Alcohol use-no Drug use-no Regular exercise-no  Review of Systems      See HPI  Physical Exam  General:  Well-developed,well-nourished,in no acute distress; alert,appropriate and cooperative throughout examination Head:  Normocephalic and atraumatic without obvious abnormalities. No apparent alopecia or balding. Eyes:  No corneal or conjunctival inflammation noted. EOMI. Perrla. Funduscopic exam benign, without hemorrhages, exudates or papilledema. Vision grossly normal. Ears:  External ear exam shows no significant lesions or deformities.  Otoscopic examination reveals clear canals, tympanic membranes are intact bilaterally without bulging, retraction, inflammation or discharge. Hearing is grossly normal bilaterally. Nose:  External nasal examination shows no deformity or inflammation. Nasal mucosa are pink and moist without lesions or exudates. Mouth:  Oral mucosa and oropharynx without lesions or exudates.  Teeth in good repair. Neck:  No deformities, masses, or tenderness noted. Chest Wall:  No deformities, masses, tenderness or gynecomastia noted. Breasts:  No masses or gynecomastia noted Lungs:  Normal respiratory effort, chest expands symmetrically. Lungs are clear to auscultation, no crackles or wheezes. Heart:  Normal rate and regular rhythm. S1 and S2 normal without gallop, murmur, click, rub or other extra sounds. Abdomen:  Bowel sounds positive,abdomen soft and non-tender without masses, organomegaly or hernias noted. Rectal:  No external abnormalities noted. Normal sphincter tone. No rectal masses or tenderness. Genitalia:  Testes bilaterally descended without nodularity, tenderness or masses. No scrotal masses  or lesions. No penis lesions or urethral discharge. Prostate:  Prostate gland firm and smooth, no enlargement, nodularity, tenderness,  mass, asymmetry or induration. Msk:  No deformity or scoliosis noted of thoracic or lumbar spine.   Pulses:  R and L carotid,radial,femoral,dorsalis pedis and posterior tibial pulses are full and equal bilaterally Extremities:  No clubbing, cyanosis, edema, or deformity noted with normal full range of motion of all joints.   Neurologic:  No cranial nerve deficits noted. Station and gait are normal. Plantar reflexes are down-going bilaterally. DTRs are symmetrical throughout. Sensory, motor and coordinative functions appear intact. Skin:  Intact without suspicious lesions or rashes.............scar left foot.  Previous surgery Cervical Nodes:  No lymphadenopathy noted Axillary Nodes:  No palpable lymphadenopathy Inguinal Nodes:  No significant adenopathy Psych:  Cognition and judgment appear intact. Alert and cooperative with normal attention span and concentration. No apparent delusions, illusions, hallucinations   Impression & Recommendations:  Problem # 1:  HEALTH MAINTENANCE EXAM (ICD-V70.0) Assessment Unchanged  Orders: Prescription Created Electronically [phone]) EKG w/ Interpretation (93000)  Problem # 2:  DYSPHAGIA UNSPECIFIED (ICD-787.20) Assessment: Improved  Orders: Prescription Created Electronically [phone])  Problem # 3:  ALLERGIC RHINITIS (ICD-477.9) Assessment: Improved  His updated medication list for this problem includes:    Zyrtec Allergy 10 Mg Tabs (Cetirizine hcl)    Flonase 50 Mcg/act Susp (Fluticasone propionate) ..... One spray per nostril two times a day    Astepro 0.15 % Soln (Azelastine hcl) .Marland Kitchen... At bedtime  Orders: Prescription Created Electronically [phone])  Problem # 4:  GERD (ICD-530.81) Assessment: Improved  The following medications were removed from the medication list:    Dexilant 60 Mg Cpdr (Dexlansoprazole) .Marland Kitchen... At bedtime His updated medication list for this problem includes:    Nexium 40 Mg Cpdr (Esomeprazole magnesium) ..... Once  daily  Orders: Prescription Created Electronically [phone])  Complete Medication List: 1)  Nexium 40 Mg Cpdr (Esomeprazole magnesium) .... Once daily 2)  Duragesic-75 75 Mcg/hr Pt72 (Fentanyl) .... As dir 3)  Oxyir 5 Mg Caps (Oxycodone hcl) .... Two q8h 4)  Celebrex 200 Mg Caps (Celecoxib) .... Two times a day 5)  Zyrtec Allergy 10 Mg Tabs (Cetirizine hcl) 6)  Flonase 50 Mcg/act Susp (Fluticasone propionate) .... One spray per nostril two times a day 7)  Zovirax 200 Mg Caps (Acyclovir) .... By mouth every 4 hours for ten days for first episode and one tab for  5 days for recurrent episodes 8)  Indomethacin 50 Mg Caps (Indomethacin) .... Take 1 tablet by mouth three times a day 9)  Astepro 0.15 % Soln (Azelastine hcl) .... At bedtime 10)  Aspirin 325 Mg Tabs (Aspirin) .... At bedtime  Patient Instructions: 1)  Please schedule a follow-up appointment in 1 year. 2)  I would recommend Dr. Toni Arthurs, for evaluation of his foot pain Prescriptions: ASTEPRO 0.15 % SOLN (AZELASTINE HCL) at bedtime  #3 units x 4   Entered and Authorized by:   Roderick Pee MD   Signed by:   Roderick Pee MD on 01/18/2010   Method used:   Faxed to ...       [facility] (mail-order)             , [place]         Ph: [phone]       Fax: [phone]   RxID:   [id number] INDOMETHACIN 50 MG CAPS (INDOMETHACIN) Take 1 tablet by mouth three times a day  #100 x 3  Entered and Authorized by:   Roderick Pee MD   Signed by:   Roderick Pee MD on 01/18/2010   Method used:   Faxed to ...       [facility] (mail-order)             , [place]         Ph: [phone]       Fax: [phone]   RxID:   [id number] ZOVIRAX 200 MG CAPS (ACYCLOVIR) by mouth every 4 hours for ten days for first episode and one tab for  5 days for recurrent episodes  #100 x 3   Entered and Authorized by:   Roderick Pee MD   Signed by:   Roderick Pee MD on 01/18/2010   Method used:   Faxed to ...       [facility] (mail-order)              , [place]         Ph: [phone]       Fax: [phone]   RxID:   [id number] FLONASE 50 MCG/ACT SUSP (FLUTICASONE PROPIONATE) one spray per nostril two times a day  #3 x 3   Entered and Authorized by:   Roderick Pee MD   Signed by:   Roderick Pee MD on 01/18/2010   Method used:   Faxed to ...       [facility] (mail-order)             , [place]         Ph: [phone]       Fax: [phone]   RxID:   [id number] NEXIUM 40 MG  CPDR (ESOMEPRAZOLE MAGNESIUM) once daily  #100 x 3   Entered and Authorized by:   Roderick Pee MD   Signed by:   Roderick Pee MD on 01/18/2010   Method used:   Faxed to ...       [facility] (mail-order)             , [place]         Ph: [phone]       Fax: [phone]   RxID:   [id number]    Orders Added: 1)  Prescription Created Electronically [G8553] 2)  Est. Patient 40-64 years [99396] 3)  EKG w/ Interpretation [93000]

## 2010-04-29 NOTE — Assessment & Plan Note (Signed)
Summary: 31monthrov/mm   Vital Signs:  Patient Profile:   50 Years Old Male Height:     71 inches Weight:      244 pounds Temp:     98.6 degrees F oral BP sitting:   120 / 90  (left arm) Cuff size:   regular  Vitals Entered By: Kern Reap CMA (March 03, 2008 9:06 AM)                 Chief Complaint:  follow up labs.  History of Present Illness: Lance Roman is a 50 year old male, who comes in today for evaluation of hyperlipidemia.  At his last physical exam was October his LDL was 172.  He does have a family history of coronary artery disease.  He is a nonsmoker and has had normal blood pressure and blood sugar in the past.  We start him on Zocor 20 mg a day at bedtime.  He is back today for follow-up saying he feels well.  He has no side effects from medication.  LDL dropped from 172 to 82.  Also this total dropped 70 points.    Prior Medication List:  NEXIUM 40 MG  CPDR (ESOMEPRAZOLE MAGNESIUM) once daily DURAGESIC-75 75 MCG/HR  PT72 (FENTANYL) as dir OXYIR 5 MG  CAPS (OXYCODONE HCL) two q8h CELEBREX 200 MG  CAPS (CELECOXIB) two times a day ZYRTEC ALLERGY 10 MG TABS (CETIRIZINE HCL)  CLARITIN-D 12 HOUR 5-120 MG XR12H-TAB (LORATADINE-PSEUDOEPHEDRINE)  FLONASE 50 MCG/ACT SUSP (FLUTICASONE PROPIONATE)  PRILOSEC 10 MG CPDR (OMEPRAZOLE)  ZOCOR 20 MG TABS (SIMVASTATIN) 1 tab @ bedtime ZOVIRAX 200 MG CAPS (ACYCLOVIR) by mouth every 4 hours for ten days for first episode and one tab for  5 days for recurrent episodes   Current Allergies: No known allergies   Past Medical History:    Reviewed history from 01/02/2008 and no changes required:       chronic pain syndrome       osteoarthritis       allergic rhinitis       gerd       appendectomy       lumbosacral degenerative disease       ENT surgery, benign polyp, right maxillary sinus       Hyperlipidemia   Social History:    Reviewed history from 05/04/2007 and no changes required:       Occupation:       Married     Former Smoker       Alcohol use-no       Drug use-no       Regular exercise-no    Review of Systems      See HPI   Physical Exam  General:     Well-developed,well-nourished,in no acute distress; alert,appropriate and cooperative throughout examination    Impression & Recommendations:  Problem # 1:  HYPERLIPIDEMIA (ICD-272.4) Assessment: Improved  His updated medication list for this problem includes:    Zocor 20 Mg Tabs (Simvastatin) .Marland Kitchen... 1 tab @ bedtime   Complete Medication List: 1)  Nexium 40 Mg Cpdr (Esomeprazole magnesium) .... Once daily 2)  Duragesic-75 75 Mcg/hr Pt72 (Fentanyl) .... As dir 3)  Oxyir 5 Mg Caps (Oxycodone hcl) .... Two q8h 4)  Celebrex 200 Mg Caps (Celecoxib) .... Two times a day 5)  Zyrtec Allergy 10 Mg Tabs (Cetirizine hcl) 6)  Claritin-d 12 Hour 5-120 Mg Xr12h-tab (Loratadine-pseudoephedrine) 7)  Flonase 50 Mcg/act Susp (Fluticasone propionate) 8)  Prilosec 10 Mg Cpdr (Omeprazole) 9)  Zocor 20 Mg Tabs (Simvastatin) .Marland Kitchen.. 1 tab @ bedtime 10)  Zovirax 200 Mg Caps (Acyclovir) .... By mouth every 4 hours for ten days for first episode and one tab for  5 days for recurrent episodes   Patient Instructions: 1)  continue current medications return in October 2010 for your annualphysical   ]

## 2010-06-18 ENCOUNTER — Ambulatory Visit (INDEPENDENT_AMBULATORY_CARE_PROVIDER_SITE_OTHER): Payer: BC Managed Care – PPO | Admitting: Internal Medicine

## 2010-06-18 ENCOUNTER — Encounter: Payer: Self-pay | Admitting: Internal Medicine

## 2010-06-18 DIAGNOSIS — M109 Gout, unspecified: Secondary | ICD-10-CM

## 2010-06-18 MED ORDER — METHYLPREDNISOLONE (PAK) 4 MG PO TABS: ORAL_TABLET | ORAL | Status: AC

## 2010-06-18 MED ORDER — METHYLPREDNISOLONE ACETATE 40 MG/ML IJ SUSP
40.0000 mg | Freq: Once | INTRAMUSCULAR | Status: AC
  Administered 2010-06-18: 40 mg via INTRAMUSCULAR

## 2010-06-19 ENCOUNTER — Encounter: Payer: Self-pay | Admitting: Internal Medicine

## 2010-06-19 NOTE — Progress Notes (Signed)
  Subjective:    Patient ID: Lance Roman, male    DOB: September 04, 1960, 50 y.o.   MRN: [id number]  HPI Pt presents to clinic for evaluation of foot pain. Notes intermittent left medial foot pain at base of first toe including recent several day hx of pain at the location. No injury/trauma. States h/o gout with sx's cw previous attacks. Attempted indocin without significant improvement. Takes narcotic pain medication under direction of pain clinic. Walk with a limp.  Reviewed pmh, medications and allergies.    Review of Systems  Constitutional: Negative for fever and chills.  Musculoskeletal: Positive for joint swelling, arthralgias and gait problem.       Objective:   Physical Exam  Nursing note and vitals reviewed. Constitutional: He appears well-developed and well-nourished. No distress.  HENT:  Head: Normocephalic and atraumatic.  Musculoskeletal:       Left medial 1st toe mild erythema, swelling and warmth. +tenderness  Neurological: He is alert.  Skin: Skin is warm and dry. No rash noted. He is not diaphoretic. There is erythema.          Assessment & Plan:

## 2010-06-19 NOTE — Assessment & Plan Note (Signed)
Attempt depomedrol 80mg  IM today and begin medrol dosepak. Followup if no improvement or worsening.

## 2010-07-06 LAB — URIC ACID: Uric Acid, Serum: 6.4 mg/dL (ref 4.0–7.8)

## 2010-08-10 NOTE — Assessment & Plan Note (Signed)
[facility]                         GASTROENTEROLOGY OFFICE NOTE   Lance Roman, REHFELDT                      MRN:          [id number]  DATE:03/07/2007                            DOB:          07-Aug-1960    REASON FOR CONSULTATION:  Dysphagia.   HISTORY OF PRESENT ILLNESS:  Lance Roman is a 50 year old white male that  I have evaluated in the past. He has a history of gastroesophageal  reflux disease with dysphagia. He underwent an upper endoscopy in  February of 2003, which did not show a stricture. He was dilated  empirically with [facility] dilators with relief of his dysphagia. He  generally takes Nexium at bedtime and has recently started Prilosec  b.i.d. in addition to Nexium. He has had frequent burning and  indigestion over the past few months. He notes dysphagia particularly to  pills over the past two years. He only has rare episodes of dysphagia  with solid foods. His problems are generally confined to pills and he  localizes his symptoms to the base of his neck. He relates problems with  mild constipation and intestinal gas. He notes no odynophagia, nausea,  vomiting or weight loss, abdominal pain, melena, or hematochezia.   PAST MEDICAL HISTORY:  1. Gastroesophageal reflux disease.  2. Allergic rhinitis.  3. Chronic lower back pain.  4. Status post appendectomy in 1969.   MEDICATION ALLERGIES:  None known.   CURRENT MEDICATIONS:  1. Nexium 40 mg p.o. q.h.s.  2. Prilosec 40 mg p.o. b.i.d.  3. Zyrtec daily.  4. Flonase two sprays each nostril q.h.s.  5. Oxy IR 10 mg q8 hours p.r.n.  6. Duragesic patch 75 mcg changed q48 hours.  7. Celebrex 200 mg b.i.d.  8. Fish oil one daily.   SOCIAL HISTORY:  Per the handwritten form.   REVIEW OF SYSTEMS:  Per the handwritten form.   PHYSICAL EXAMINATION:  Overweight white male in no acute distress.  Height 5 feet, 8 inches. Weight 237.2 pounds. Blood pressure 118/68,  pulse 70 and regular.  HEENT: Anicteric sclerae. Oropharynx clear.  CHEST: Clear to auscultation bilaterally.  CARDIAC: Regular rate and rhythm without murmurs appreciated.  ABDOMEN: Soft and nontender. Nondistended. Normoactive bowel sounds. No  palpable organomegaly, masses or hernias.  EXTREMITIES: Without clubbing, cyanosis or edema.  NEUROLOGIC: Alert and oriented x3. Grossly nonfocal.   ASSESSMENT/PLAN:  Gastroesophageal reflux disease with dysphagia. His  dysphagia is mainly to pills. Rule out peptic strictures and other  esophageal disorders. Change PPI therapy to Nexium 40 mg p.o. q a.m.  taken 30 minutes before breakfast and Prilosec 40 mg taken 40 minutes  before dinner. Re-intensify all anti-reflux measures. Risks, benefits  and alternatives to upper endoscopy with possible biopsy and possible  dilation discussed with the patient and he consents to proceed. This  will be scheduled electively.     Lance Lick. Russella Dar, MD, [facility]  Electronically Signed    MTS/MedQ  DD: 03/08/2007  DT: 03/08/2007  Job #: [id number]   cc:   Tinnie Gens A. Tawanna Cooler, MD

## 2010-08-13 NOTE — Op Note (Signed)
[place]  Patient:    Lance Roman, Lance Roman                      MRN: [id number] Proc. Date: 03/07/00 Adm. Date:  [id number] Attending:  Donn Pierini                           Operative Report  SERVICE:  Neurosurgery.  PREOPERATIVE DIAGNOSES:  L4-5 and L5-S1 degenerative disk disease with chronic intractable back pain.  POSTOPERATIVE DIAGNOSES:  L4-5 and L5-S1 degenerative disk disease with intractable back pain.  PROCEDURE: 1. L4-5 and L5-S1 decompressive laminectomies with bilateral L4, L5, and S1    foraminotomies. 2. L4-5 and L5-S1 posterior lumbar antibody fusion utilizing tangent wedges    and local autograft. 3. Posterolateral fusion with segmental pedicle screw instrumentation from L4    to S1 utilizing local autografts.  SURGEON:  Julio Sicks, M.D.  ASSISTANT:  Reinaldo Meeker, M.D.  ANESTHESIA:  General endotracheal.  INDICATIONS:  [name] is a 50 year old white male with a history of chronic intractable back and left lower extremity pain which has failed all efforts of conservative management.  In 1999, the patient underwent lumbar discography which demonstrated annular disruption at the L4-5 and L5-S1 level.  The patient was referred to the [facility], where he underwent IDET at both levels.  The patient had transient improvement for approximately 3-6 months, however, his symptoms have now returned, and they are just as bad if not worse than ever.  At this point the patients only foreseeable option is a lumbar fusion.  He has been recommended fusion by a number of doctors, he comes to me for this procedure to be performed.  We have discussed the risks and benefits with a great deal of emphasis on the possibility of non-benefit.  The patient wishes to proceed.  We will proceed with a 2 level decompression and fusion surgery.  DESCRIPTION OF PROCEDURE:  The patient was taken to the operating room  and placed on the operating table in the supine position.  After an adequate level of anesthesia was achieved, the patient was positioned prone on the Wilson frame and appropriately padded.  The patients lumbar region was shaved and prepped sterilely.  A 10 blade is used to make a linear skin incision extending from L3 down to S1.  This is carried down sharply in the midline.  A subperiosteal dissection was performed bilaterally exposing the lamina and facet joints of L3, L4, L5 and S1.  The transverse processes of L4 and L5 as well as the ala of the sacrum were then exposed bilaterally.  A deep self-retaining retractor was placed.  Intraoperative fluoroscopy was used and the level was confirmed.  Decompressive laminectomy was then performed at the L4, L5 and superior aspect of the S1 levels.  All bone was used for later use as an autograft.  A wide facetectomy was then performed at the L4-5 and L5-S1 levels.  Once again all bone was saved.  The ligamentum of flavum was then elevated and resected in a piecemeal fashion.  The underlying thecal sac and exiting L4, L5, and S1 nerve roots were identified and decompressed well out into their neuroforamen.  At this point attention was then placed at the disk space.  Diskectomies were performed at the L4-5 and L5-S1 levels bilaterally.  After aggressive diskectomy had been performed starting first at the  L4-5 level the disk space was then distracted up to 12 mm.  Starting on the patients left side with the distractor on the right side and the nerve roots and thecal sac protected, the disk space was entered with a reamer and then subsequently with a 12 mm chisel.  A 12 x 20 mm tangent wedge was then impacted into place under fluoroscopic guidance.  This was found to be well positioned.  The procedure was then repeated on the contralateral side.  Prior to insertion of the second wedge, morcellized autograft was packed in the interspace. Images  with intraoperative fluoroscopy revealed good position of the bone graft at the proper optimum level with normal alignment of the spine.  The procedure was then repeated at the L5-S1 level utilizing 10 x 22 mm wedges bilaterally.  Once again intraoperative fluoroscopy revealed good position of bone grafts and autograft at the proper optimum level with normal alignment of the spine.  Attention was then placed at placing segmental pedicle screws for instrumentation.  The pedicles of L4, L5 and S1 were identified bilaterally. Superficial bone was removed overlying the pedicles using the high speed drill.  The pedicles were then probed utilizing a pedicle awl.  Each pedicle track was then tapped using a 5.25 mm tap.  All maneuvers were performed under fluoroscopic guidance.  Between each maneuver the holes were probed with a blunt probe to ensure placement solidly within the pedicle.  At the L4 and L5 levels bilaterally 6.75 x 40 mm ______ variable headed screws were placed.  At the S1 level a 6.75 x 35 mm ______ screws were placed bilaterally.  A 80 mm section of titanium rod was then contoured appropriately bilaterally. It was then placed over the screw heads at L4, L5 and S1.  This was then locked down to the rod, by placing locking caps and the caps were tightened in a sequential fashion to place the construct under compression.  The spinal canal was inspected and found to be well decompressed.  There was no evidence of any compression of the nerve rods.   A blunt probe was passed easily along the course of the nerve roots.  Final images with fluoroscopy revealed good position of the bone graft and hardware at the proper optimum level in both the AP and lateral planes.  The wound was irrigated copiously with antibiotic solution.  The transverse processes and sacral ala were decorticated using the high-speed drill bilaterally.  Morcellized autograft was packed  posterolaterally.  Gelfoam was placed over the thecal sac.  A medium Hemovac drain was left in the epidural space.  The wound was then closed in layers.  Steri-Strips were  applied to the surface.   There were no apparent complications.  The patient tolerated the procedure well and he returned to the recovery room postoperatively. DD:  03/07/00 TD:  03/07/00 Job: [id number]

## 2010-08-13 NOTE — Op Note (Signed)
NAME:  Lance Roman, Lance Roman               ACCOUNT NO.:  [id number]   MEDICAL RECORD NO.:  [id number]          PATIENT TYPE:  AMB   LOCATION:  DSC                          FACILITY:  [facility]   PHYSICIAN:  Jefry H. Pollyann Kennedy, MD     DATE OF BIRTH:  05-11-1960   DATE OF PROCEDURE:  02/15/2006  DATE OF DISCHARGE:                               OPERATIVE REPORT   PREOP DIAGNOSIS:  Inverting papilloma right maxillary sinus.   POSTOPERATIVE DIAGNOSIS:  Inverting papilloma right maxillary sinus.   PROCEDURE:  Caldwell-Luc with endoscopic assistance for removal of  maxillary sinus mass.   SURGEON:  Jefry H. Pollyann Kennedy, MD   ANESTHESIA:  General endotracheal anesthesia was used.   COMPLICATIONS:  None.   FINDINGS:  Diffuse papillomatous-type growth throughout the antrum,  mainly inferior and lateral aspect of the antrum.  There was no bony  invasion or any bony dehiscence in these two areas or in the orbital  floor.  There was no extension into the ethmoid cavity.   REFERRING PHYSICIAN:  Dr. Alonza Smoker .   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  Office follow-up revealed increased tissue  swelling within the maxillary sinus; that was of concern for recurrence.  Risks, benefits, alternatives, and complications of the procedure were  explained to the patient, seemed to understand, and agreed to surgery.   DESCRIPTION OF PROCEDURE:  The patient was taken to the operating room,  placed on the operating table in the supine position.  Following  induction of general endotracheal anesthesia, the face was prepped and  draped in the standard fashion.  Afrin spray was used preoperatively in  the nasal cavities.  Then 1% Xylocaine with epinephrine was infiltrated  into the middle and inferior turbinates, and the lateral nasal wall, as  well as the right canine fossa.  Endoscopic  inspection of the antrostomy  revealed an open antrostomy with a soft tissue mass within the antrum.  Multiple biopsies were taken and sent for frozen section evaluation.  Frozen section revealed benign appearing polyps with one area that was  suspicious for inverting papilloma.  With the 30-degree endoscope, I was  able to visualize more laterally in the sinus, but was not able to  retrieve that tissue as well; but that tissue was very suspicious  looking for a papilloma.   Decision was then made to perform the Caldwell-Luc approach.  Electrocautery was used to incise the mucosa of the canine fossa down to  the periosteum.  Periosteum was elevated, exposing the face of the  maxilla.  A 4-mm osteotome was used to enter through the bone and  Kerrison rongeurs were used to enlarge the opening superiorly staying  below the inferior orbital nerve medially, staying away from the  piriform aperture, and inferiorly staying away from the roots of the  teeth.  Laterally I was able to go all the way to the lateral buttress.   I was unable to enter the sinus; and using direct vision and endoscopic  assistance,  I was able to clean out all of the abnormal tissue and  remove all of the lining from the sinus.  There did not appear to be any  growth on the medial wall so the inferior turbinate was not removed.  The antrostomy was enlarged, anteriorly and posteriorly, to facilitate  later inspection and cleaning.  The middle turbinate was resected as  well, also to facilitate later inspection of the cavity. A separate  specimen was sent from the lateral extent of the antrum to rule out  papilloma in that area.  After adequate dissection was completed the  mucosal incision in the oral cavity was reapproximated with a running  interlocking 3-0 Vicryl suture.  The sinus was packed from the nasal  side with a Adaptic gauze coated with bacitracin.  A long Merocel was  placed in the nasal cavity and inflated  with local anesthetic solution.  The pharynx was suctioned of blood and secretions.  The patient was then  awakened, extubated, and transferred to recovery in stable condition.      Jefry H. Pollyann Kennedy, MD  Electronically Signed     JHR/MEDQ  D:  02/15/2006  T:  02/15/2006  Job:  [id number]   cc:   Tinnie Gens A. Tawanna Cooler, MD

## 2010-11-02 ENCOUNTER — Ambulatory Visit (INDEPENDENT_AMBULATORY_CARE_PROVIDER_SITE_OTHER): Payer: BC Managed Care – PPO | Admitting: Family Medicine

## 2010-11-02 ENCOUNTER — Encounter: Payer: Self-pay | Admitting: Family Medicine

## 2010-11-02 DIAGNOSIS — G43919 Migraine, unspecified, intractable, without status migrainosus: Secondary | ICD-10-CM

## 2010-11-02 DIAGNOSIS — R197 Diarrhea, unspecified: Secondary | ICD-10-CM

## 2010-11-02 MED ORDER — HYOSCYAMINE SULFATE 0.125 MG SL SUBL: 0.1250 mg | SUBLINGUAL_TABLET | SUBLINGUAL | Status: AC | PRN

## 2010-11-02 MED ORDER — PREDNISONE 20 MG PO TABS: ORAL_TABLET | ORAL | Status: DC

## 2010-11-02 NOTE — Patient Instructions (Signed)
Rest at home.  Clear liquid diet.  Levsin one every 4 to 6 hours as needed for cramps.  Once the diarrhea has stopped and then begin the prednisone as outlined to stop the cluster migraine.  If the abdominal cramping or diarrhea.  Does not improve in the next two to 3 days and call GI for further evaluation

## 2010-11-02 NOTE — Progress Notes (Signed)
  Subjective:    Patient ID: Lance Roman, male    DOB: 05-Apr-1960, 50 y.o.   MRN: [id number]  HPI Lance Roman is a 50 year old male, nonsmoker, who comes in today for evaluation of two problems.  For the last 4, days he's had diarrhea.  Nausea no vomiting, or fever .  Since midnight.  He said 6 bowel movements.  Review of systems otherwise negative.  GI-wise.  Also for the last 4 days has had a constant migraine headache.  He takes over-the-counter medication to no avail.  Personal and family history negative for GI disease   Review of Systems General GI and neurologic review of systems otherwise negative   Objective:   Physical Exam  Well-developed well-nourished, male in no acute distress.  Examination the abdomen shows the abdomen is flat.  The bowel sounds are normal.  Diffuse abdominal tenderness.  No rebound nor masses.      Assessment & Plan:  Viral gastroenteritis.  Plan bed rest, clear liquids.  GI consult is symptoms persist.  Migraine headache cluster type once diarrhea stops take a short course of prednisone

## 2011-03-03 ENCOUNTER — Other Ambulatory Visit (INDEPENDENT_AMBULATORY_CARE_PROVIDER_SITE_OTHER): Payer: BC Managed Care – PPO

## 2011-03-03 DIAGNOSIS — Z0000 Encounter for general adult medical examination without abnormal findings: Secondary | ICD-10-CM

## 2011-03-03 LAB — CBC WITH DIFFERENTIAL/PLATELET
Basophils Absolute: 0 10*3/uL (ref 0.0–0.1)
Basophils Relative: 0.6 % (ref 0.0–3.0)
Eosinophils Absolute: 0.1 10*3/uL (ref 0.0–0.7)
Eosinophils Relative: 3.2 % (ref 0.0–5.0)
HCT: 38.5 % — ABNORMAL LOW (ref 39.0–52.0)
Hemoglobin: 13.1 g/dL (ref 13.0–17.0)
Lymphocytes Relative: 33.6 % (ref 12.0–46.0)
Lymphs Abs: 1.2 10*3/uL (ref 0.7–4.0)
MCHC: 34 g/dL (ref 30.0–36.0)
MCV: 86.7 fl (ref 78.0–100.0)
Monocytes Absolute: 0.3 10*3/uL (ref 0.1–1.0)
Monocytes Relative: 7.2 % (ref 3.0–12.0)
Neutro Abs: 2.1 10*3/uL (ref 1.4–7.7)
Neutrophils Relative %: 55.4 % (ref 43.0–77.0)
Platelets: 167 10*3/uL (ref 150.0–400.0)
RBC: 4.44 Mil/uL (ref 4.22–5.81)
RDW: 14.4 % (ref 11.5–14.6)
WBC: 3.7 10*3/uL — ABNORMAL LOW (ref 4.5–10.5)

## 2011-03-03 LAB — BASIC METABOLIC PANEL
BUN: 20 mg/dL (ref 6–23)
CO2: 28 mEq/L (ref 19–32)
Calcium: 8.9 mg/dL (ref 8.4–10.5)
Chloride: 104 mEq/L (ref 96–112)
Creatinine, Ser: 0.9 mg/dL (ref 0.4–1.5)
GFR: 100.34 mL/min (ref >60.00)
Glucose, Bld: 102 mg/dL — ABNORMAL HIGH (ref 70–99)
Potassium: 4.1 mEq/L (ref 3.5–5.1)
Sodium: 141 mEq/L (ref 135–145)

## 2011-03-03 LAB — POCT URINALYSIS DIPSTICK
Bilirubin, UA: NEGATIVE
Blood, UA: NEGATIVE
Glucose, UA: NEGATIVE
Leukocytes, UA: NEGATIVE
Nitrite, UA: NEGATIVE
Protein, UA: NEGATIVE
Spec Grav, UA: 1.025
Urobilinogen, UA: 0.2
pH, UA: 6

## 2011-03-03 LAB — LIPID PANEL
Cholesterol: 171 mg/dL (ref 0–200)
HDL: 38.7 mg/dL — ABNORMAL LOW (ref >39.00)
LDL Cholesterol: 117 mg/dL — ABNORMAL HIGH (ref 0–99)
Total CHOL/HDL Ratio: 4
Triglycerides: 78 mg/dL (ref 0.0–149.0)
VLDL: 15.6 mg/dL (ref 0.0–40.0)

## 2011-03-03 LAB — HEPATIC FUNCTION PANEL
ALT: 18 U/L (ref 0–53)
AST: 23 U/L (ref 0–37)
Albumin: 4.2 g/dL (ref 3.5–5.2)
Alkaline Phosphatase: 69 U/L (ref 39–117)
Bilirubin, Direct: 0 mg/dL (ref 0.0–0.3)
Total Bilirubin: 0.3 mg/dL (ref 0.3–1.2)
Total Protein: 6.7 g/dL (ref 6.0–8.3)

## 2011-03-03 LAB — TSH: TSH: 1.69 u[IU]/mL (ref 0.35–5.50)

## 2011-03-10 ENCOUNTER — Encounter: Payer: Self-pay | Admitting: Family Medicine

## 2011-03-10 ENCOUNTER — Ambulatory Visit (INDEPENDENT_AMBULATORY_CARE_PROVIDER_SITE_OTHER): Payer: BC Managed Care – PPO | Admitting: Family Medicine

## 2011-03-10 DIAGNOSIS — B009 Herpesviral infection, unspecified: Secondary | ICD-10-CM

## 2011-03-10 DIAGNOSIS — A609 Anogenital herpesviral infection, unspecified: Secondary | ICD-10-CM

## 2011-03-10 DIAGNOSIS — K219 Gastro-esophageal reflux disease without esophagitis: Secondary | ICD-10-CM

## 2011-03-10 DIAGNOSIS — R1310 Dysphagia, unspecified: Secondary | ICD-10-CM

## 2011-03-10 DIAGNOSIS — J309 Allergic rhinitis, unspecified: Secondary | ICD-10-CM

## 2011-03-10 MED ORDER — AZELASTINE HCL 0.15 % NA SOLN: 1.0000 | Freq: Every day | NASAL | Status: DC

## 2011-03-10 MED ORDER — ESOMEPRAZOLE MAGNESIUM 40 MG PO CPDR: 40.0000 mg | DELAYED_RELEASE_CAPSULE | Freq: Three times a day (TID) | ORAL | Status: DC

## 2011-03-10 MED ORDER — FLUTICASONE PROPIONATE 50 MCG/ACT NA SUSP: 2.0000 | Freq: Two times a day (BID) | NASAL | Status: DC

## 2011-03-10 MED ORDER — ACYCLOVIR 200 MG PO CAPS: 200.0000 mg | ORAL_CAPSULE | Freq: Every day | ORAL | Status: DC

## 2011-03-10 NOTE — Progress Notes (Signed)
  Subjective:    Patient ID: Lance Roman, male    DOB: 03/14/1961, 50 y.o.   MRN: [id number]  HPI Lance Roman is a 50 year old male, nonsmoker, who comes in today for general physical examination because of a history of HSV, allergic rhinitis, reflux, esophagitis, chronic pain syndrome.  We treat all his medical problems except for the chronic pain.  He goes to the pain clinic.  Medicines reviewed and there been no changes.  He gets routine eye care, dental care, tetanus, 2004, seasonal flu shot 2012.   Review of Systems  Constitutional: Negative.   HENT: Negative.   Eyes: Negative.   Respiratory: Negative.   Cardiovascular: Negative.   Gastrointestinal: Negative.   Genitourinary: Negative.   Musculoskeletal: Negative.   Skin: Negative.   Neurological: Negative.   Hematological: Negative.   Psychiatric/Behavioral: Negative.        Objective:   Physical Exam  Constitutional: He is oriented to person, place, and time. He appears well-developed and well-nourished.  HENT:  Head: Normocephalic and atraumatic.  Right Ear: External ear normal.  Left Ear: External ear normal.  Nose: Nose normal.  Mouth/Throat: Oropharynx is clear and moist.  Eyes: Conjunctivae and EOM are normal. Pupils are equal, round, and reactive to light.  Neck: Normal range of motion. Neck supple. No JVD present. No tracheal deviation present. No thyromegaly present.  Cardiovascular: Normal rate, regular rhythm, normal heart sounds and intact distal pulses.  Exam reveals no gallop and no friction rub.   No murmur heard. Pulmonary/Chest: Effort normal and breath sounds normal. No stridor. No respiratory distress. He has no wheezes. He has no rales. He exhibits no tenderness.  Abdominal: Soft. Bowel sounds are normal. He exhibits no distension and no mass. There is no tenderness. There is no rebound and no guarding.  Genitourinary: Rectum normal, prostate normal and penis normal. Guaiac negative stool. No penile  tenderness.  Musculoskeletal: Normal range of motion. He exhibits no edema and no tenderness.  Lymphadenopathy:    He has no cervical adenopathy.  Neurological: He is alert and oriented to person, place, and time. He has normal reflexes. No cranial nerve deficit. He exhibits normal muscle tone.  Skin: Skin is warm and dry. No rash noted. No erythema. No pallor.  Psychiatric: He has a normal mood and affect. His behavior is normal. Judgment and thought content normal.          Assessment & Plan:  Healthy male.  History recurrent HSV Zovirax p.r.n.  Allergic rhinitis continues nasal spray and Flonase.  Reflux esophagitis.  Continue Nexium.  Chronic pain followed in the pain clinic.  Occasional joint pain, responsive to prednisone with normal uric acid level

## 2011-03-10 NOTE — Patient Instructions (Signed)
Continue her current medications.  Follow-up in one year, sooner if any problem

## 2011-04-12 ENCOUNTER — Other Ambulatory Visit: Payer: Self-pay | Admitting: *Deleted

## 2011-04-12 DIAGNOSIS — R1310 Dysphagia, unspecified: Secondary | ICD-10-CM

## 2011-04-12 DIAGNOSIS — K219 Gastro-esophageal reflux disease without esophagitis: Secondary | ICD-10-CM

## 2011-04-12 MED ORDER — ESOMEPRAZOLE MAGNESIUM 40 MG PO CPDR: 40.0000 mg | DELAYED_RELEASE_CAPSULE | Freq: Three times a day (TID) | ORAL | Status: DC

## 2011-04-12 NOTE — Telephone Encounter (Signed)
Patient requesting refill of nexium

## 2011-08-16 ENCOUNTER — Encounter: Payer: Self-pay | Admitting: Gastroenterology

## 2012-07-12 ENCOUNTER — Encounter: Payer: Self-pay | Admitting: Gastroenterology

## 2014-12-30 ENCOUNTER — Other Ambulatory Visit (INDEPENDENT_AMBULATORY_CARE_PROVIDER_SITE_OTHER): Payer: BLUE CROSS/BLUE SHIELD

## 2014-12-30 ENCOUNTER — Ambulatory Visit (INDEPENDENT_AMBULATORY_CARE_PROVIDER_SITE_OTHER): Payer: BLUE CROSS/BLUE SHIELD | Admitting: Internal Medicine

## 2014-12-30 ENCOUNTER — Encounter: Payer: Self-pay | Admitting: Internal Medicine

## 2014-12-30 VITALS — BP 128/62 | HR 56 | Temp 98.4°F | Resp 16 | Ht 70.0 in | Wt 200.8 lb

## 2014-12-30 DIAGNOSIS — G8929 Other chronic pain: Secondary | ICD-10-CM

## 2014-12-30 DIAGNOSIS — Z23 Encounter for immunization: Secondary | ICD-10-CM

## 2014-12-30 DIAGNOSIS — M545 Low back pain: Secondary | ICD-10-CM

## 2014-12-30 DIAGNOSIS — Z0000 Encounter for general adult medical examination without abnormal findings: Secondary | ICD-10-CM | POA: Diagnosis not present

## 2014-12-30 DIAGNOSIS — Z79899 Other long term (current) drug therapy: Secondary | ICD-10-CM | POA: Insufficient documentation

## 2014-12-30 LAB — COMPREHENSIVE METABOLIC PANEL
ALT: 12 U/L (ref 0–53)
AST: 17 U/L (ref 0–37)
Albumin: 4.4 g/dL (ref 3.5–5.2)
Alkaline Phosphatase: 78 U/L (ref 39–117)
BUN: 20 mg/dL (ref 6–23)
CO2: 29 mEq/L (ref 19–32)
Calcium: 9.8 mg/dL (ref 8.4–10.5)
Chloride: 103 mEq/L (ref 96–112)
Creatinine, Ser: 0.88 mg/dL (ref 0.40–1.50)
GFR: 96.24 mL/min (ref >60.00)
Glucose, Bld: 108 mg/dL — ABNORMAL HIGH (ref 70–99)
Potassium: 4.9 mEq/L (ref 3.5–5.1)
Sodium: 142 mEq/L (ref 135–145)
Total Bilirubin: 0.4 mg/dL (ref 0.2–1.2)
Total Protein: 7 g/dL (ref 6.0–8.3)

## 2014-12-30 LAB — HEPATITIS C ANTIBODY: HCV Ab: NEGATIVE

## 2014-12-30 LAB — LIPID PANEL
Cholesterol: 200 mg/dL (ref 0–200)
HDL: 55.6 mg/dL (ref >39.00)
LDL Cholesterol: 128 mg/dL — ABNORMAL HIGH (ref 0–99)
NonHDL: 144.09
Total CHOL/HDL Ratio: 4
Triglycerides: 78 mg/dL (ref 0.0–149.0)
VLDL: 15.6 mg/dL (ref 0.0–40.0)

## 2014-12-30 NOTE — Progress Notes (Signed)
   Subjective:    Patient ID: Lance Roman, male    DOB: 01-Apr-1960, 54 y.o.   MRN: [id number]  HPI The patient is a new 54 YO man coming in for wellness. No concerns. Has chronic pain in his back and goes to pain clinic for management of that.   PMH, [facility], social history reviewed and updated.   Review of Systems  Constitutional: Negative for fever, activity change, appetite change, fatigue and unexpected weight change.  HENT: Negative.   Eyes: Negative.   Respiratory: Negative for cough, chest tightness, shortness of breath and wheezing.   Cardiovascular: Negative for chest pain, palpitations and leg swelling.  Gastrointestinal: Negative for nausea, vomiting, abdominal pain, diarrhea, blood in stool and abdominal distention.  Musculoskeletal: Positive for back pain. Negative for myalgias and arthralgias.  Skin: Negative.   Neurological: Negative.   Psychiatric/Behavioral: Negative.       Objective:   Physical Exam  Constitutional: He is oriented to person, place, and time. He appears well-developed and well-nourished.  HENT:  Head: Normocephalic and atraumatic.  Right Ear: External ear normal.  Left Ear: External ear normal.  Eyes: EOM are normal.  Neck: Normal range of motion.  Cardiovascular: Normal rate and regular rhythm.   Pulmonary/Chest: Effort normal and breath sounds normal. No respiratory distress. He has no wheezes. He has no rales.  Abdominal: Soft. Bowel sounds are normal. He exhibits no distension. There is no tenderness. There is no rebound.  Musculoskeletal: He exhibits no edema.  Neurological: He is alert and oriented to person, place, and time.  Skin: Skin is warm and dry.  Psychiatric: He has a normal mood and affect.   Filed Vitals:   12/30/14 0806  BP: 128/62  Pulse: 56  Temp: 98.4 F (36.9 C)  TempSrc: Oral  Resp: 16  Height:  (1.778 m)  Weight: 200 lb 12.8 oz (91.082 kg)  SpO2: 98%      Assessment & Plan:  Tdap and flu shot given at  visit.

## 2014-12-30 NOTE — Assessment & Plan Note (Signed)
Checking labs today, exercises. Former smoker and counseled on staying smoke free. Tdap and flu shot given at visit. Cologuard ordered today since he does not want colonoscopy. Agrees to hep c screening but does not want HIV screening.

## 2014-12-30 NOTE — Progress Notes (Signed)
Pre visit review using our clinic review tool, if applicable. No additional management support is needed unless otherwise documented below in the visit note. 

## 2014-12-30 NOTE — Patient Instructions (Signed)
We are checking the labs today and have given you the tetanus and the flu shot.   We will get the cologuard which should come in the mail. We will call you back about the results (it generally takes a couple of weeks to process).  Come back in about 1 year for a check up.  Health Maintenance A healthy lifestyle and preventative care can promote health and wellness.  Maintain regular health, dental, and eye exams.  Eat a healthy diet. Foods like vegetables, fruits, whole grains, low-fat dairy products, and lean protein foods contain the nutrients you need and are low in calories. Decrease your intake of foods high in solid fats, added sugars, and salt. Get information about a proper diet from your health care provider, if necessary.  Regular physical exercise is one of the most important things you can do for your health. Most adults should get at least 150 minutes of moderate-intensity exercise (any activity that increases your heart rate and causes you to sweat) each week. In addition, most adults need muscle-strengthening exercises on 2 or more days a week.   Maintain a healthy weight. The body mass index (BMI) is a screening tool to identify possible weight problems. It provides an estimate of body fat based on height and weight. Your health care provider can find your BMI and can help you achieve or maintain a healthy weight. For males 20 years and older:  A BMI below 18.5 is considered underweight.  A BMI of 18.5 to 24.9 is normal.  A BMI of 25 to 29.9 is considered overweight.  A BMI of 30 and above is considered obese.  Maintain normal blood lipids and cholesterol by exercising and minimizing your intake of saturated fat. Eat a balanced diet with plenty of fruits and vegetables. Blood tests for lipids and cholesterol should begin at age 72 and be repeated every 5 years. If your lipid or cholesterol levels are high, you are over age 80, or you are at high risk for heart disease, you  may need your cholesterol levels checked more frequently.Ongoing high lipid and cholesterol levels should be treated with medicines if diet and exercise are not working.  If you smoke, find out from your health care provider how to quit. If you do not use tobacco, do not start.  Lung cancer screening is recommended for adults aged 55-80 years who are at high risk for developing lung cancer because of a history of smoking. A yearly low-dose CT scan of the lungs is recommended for people who have at least a 30-pack-year history of smoking and are current smokers or have quit within the past 15 years. A pack year of smoking is smoking an average of 1 pack of cigarettes a day for 1 year (for example, a 30-pack-year history of smoking could mean smoking 1 pack a day for 30 years or 2 packs a day for 15 years). Yearly screening should continue until the smoker has stopped smoking for at least 15 years. Yearly screening should be stopped for people who develop a health problem that would prevent them from having lung cancer treatment.  If you choose to drink alcohol, do not have more than 2 drinks per day. One drink is considered to be 12 oz (360 mL) of beer, 5 oz (150 mL) of wine, or 1.5 oz (45 mL) of liquor.  Avoid the use of street drugs. Do not share needles with anyone. Ask for help if you need support or instructions about  stopping the use of drugs.  High blood pressure causes heart disease and increases the risk of stroke. Blood pressure should be checked at least every 1-2 years. Ongoing high blood pressure should be treated with medicines if weight loss and exercise are not effective.  If you are 53-85 years old, ask your health care provider if you should take aspirin to prevent heart disease.  Diabetes screening involves taking a blood sample to check your fasting blood sugar level. This should be done once every 3 years after age 53 if you are at a normal weight and without risk factors for  diabetes. Testing should be considered at a younger age or be carried out more frequently if you are overweight and have at least 1 risk factor for diabetes.  Colorectal cancer can be detected and often prevented. Most routine colorectal cancer screening begins at the age of 66 and continues through age 63. However, your health care provider may recommend screening at an earlier age if you have risk factors for colon cancer. On a yearly basis, your health care provider may provide home test kits to check for hidden blood in the stool. A small camera at the end of a tube may be used to directly examine the colon (sigmoidoscopy or colonoscopy) to detect the earliest forms of colorectal cancer. Talk to your health care provider about this at age 49 when routine screening begins. A direct exam of the colon should be repeated every 5-10 years through age 21, unless early forms of precancerous polyps or small growths are found.  People who are at an increased risk for hepatitis B should be screened for this virus. You are considered at high risk for hepatitis B if:  You were born in a country where hepatitis B occurs often. Talk with your health care provider about which countries are considered high risk.  Your parents were born in a high-risk country and you have not received a shot to protect against hepatitis B (hepatitis B vaccine).  You have HIV or AIDS.  You use needles to inject street drugs.  You live with, or have sex with, someone who has hepatitis B.  You are a man who has sex with other men (MSM).  You get hemodialysis treatment.  You take certain medicines for conditions like cancer, organ transplantation, and autoimmune conditions.  Hepatitis C blood testing is recommended for all people born from [birth year] through 1964 and any individual with known risk factors for hepatitis C.  Healthy men should no longer receive prostate-specific antigen (PSA) blood tests as part of routine cancer  screening. Talk to your health care provider about prostate cancer screening.  Testicular cancer screening is not recommended for adolescents or adult males who have no symptoms. Screening includes self-exam, a health care provider exam, and other screening tests. Consult with your health care provider about any symptoms you have or any concerns you have about testicular cancer.  Practice safe sex. Use condoms and avoid high-risk sexual practices to reduce the spread of sexually transmitted infections (STIs).  You should be screened for STIs, including gonorrhea and chlamydia if:  You are sexually active and are younger than 24 years.  You are older than 24 years, and your health care provider tells you that you are at risk for this type of infection.  Your sexual activity has changed since you were last screened, and you are at an increased risk for chlamydia or gonorrhea. Ask your health care provider if you are  at risk.  If you are at risk of being infected with HIV, it is recommended that you take a prescription medicine daily to prevent HIV infection. This is called pre-exposure prophylaxis (PrEP). You are considered at risk if:  You are a man who has sex with other men (MSM).  You are a heterosexual man who is sexually active with multiple partners.  You take drugs by injection.  You are sexually active with a partner who has HIV.  Talk with your health care provider about whether you are at high risk of being infected with HIV. If you choose to begin PrEP, you should first be tested for HIV. You should then be tested every 3 months for as long as you are taking PrEP.  Use sunscreen. Apply sunscreen liberally and repeatedly throughout the day. You should seek shade when your shadow is shorter than you. Protect yourself by wearing long sleeves, pants, a wide-brimmed hat, and sunglasses year round whenever you are outdoors.  Tell your health care provider of new moles or changes in  moles, especially if there is a change in shape or color. Also, tell your health care provider if a mole is larger than the size of a pencil eraser.  A one-time screening for abdominal aortic aneurysm (AAA) and surgical repair of large AAAs by ultrasound is recommended for men aged 65-75 years who are current or former smokers.  Stay current with your vaccines (immunizations). Document Released: 09/10/2007 Document Revised: 03/19/2013 Document Reviewed: 08/09/2010 [facility] Patient Information 2015 [place], [place]. This information is not intended to replace advice given to you by your health care provider. Make sure you discuss any questions you have with your health care provider.

## 2014-12-30 NOTE — Assessment & Plan Note (Signed)
Seeing pain management

## 2015-02-09 LAB — COLOGUARD: Cologuard: NEGATIVE

## 2015-02-23 ENCOUNTER — Encounter: Payer: Self-pay | Admitting: Internal Medicine

## 2015-02-23 ENCOUNTER — Ambulatory Visit (INDEPENDENT_AMBULATORY_CARE_PROVIDER_SITE_OTHER): Payer: BLUE CROSS/BLUE SHIELD | Admitting: Internal Medicine

## 2015-02-23 VITALS — BP 130/80 | HR 64 | Wt 199.0 lb

## 2015-02-23 DIAGNOSIS — L723 Sebaceous cyst: Secondary | ICD-10-CM

## 2015-02-23 DIAGNOSIS — L089 Local infection of the skin and subcutaneous tissue, unspecified: Secondary | ICD-10-CM

## 2015-02-23 MED ORDER — DOXYCYCLINE HYCLATE 100 MG PO TABS: 100.0000 mg | ORAL_TABLET | Freq: Two times a day (BID) | ORAL | Status: DC

## 2015-02-23 MED ORDER — SILVER SULFADIAZINE 1 % EX CREA: 1.0000 "application " | TOPICAL_CREAM | Freq: Two times a day (BID) | CUTANEOUS | Status: DC

## 2015-02-23 NOTE — Patient Instructions (Signed)
   Wound instructions : change dressing once a day or twice a day is needed. Change dressing after  shower in the morning.  Pat dry the wound with gauze. Pull out one inch of packing everyday and cut it off. Re-dress wound with antibiotic ointment and Telfa pad or a Band-Aid of appropriate size.   Please contact us if you notice a recollection of pus in the abscess fever and chills increased pain redness red streaks near the abscess increased swelling in the area.     Please contact us if you notice a recollection of pus in the abscess fever and chills increased pain redness red streaks near the abscess increased swelling in the area.

## 2015-02-23 NOTE — Progress Notes (Signed)
Subjective:  Patient ID: Lance Roman, male    DOB: January 26, 1961  Age: 54 y.o. MRN: [id number]  CC: No chief complaint on file.   HPI SHANTI EICHEL presents for a painful swelling on the lower back x several days  Outpatient Prescriptions Prior to Visit  Medication Sig Dispense Refill  . acyclovir (ZOVIRAX) 200 MG capsule Take 1 capsule (200 mg total) by mouth 5 (five) times daily. 50 capsule 4  . Azelastine HCl (ASTEPRO) 0.15 % SOLN Place 1 Act into the nose at bedtime. 30 mL 11  . celecoxib (CELEBREX) 200 MG capsule Take 200 mg by mouth 2 (two) times daily.      Marland Kitchen esomeprazole (NEXIUM) 40 MG capsule Take 1 capsule (40 mg total) by mouth every 8 (eight) hours. 100 capsule 3  . fentaNYL (DURAGESIC) 75 MCG/HR Place 1 patch onto the skin. As directed     . fluticasone (FLONASE) 50 MCG/ACT nasal spray Place 2 sprays into the nose 2 (two) times daily. 16 g 11  . Oxycodone HCl 10 MG TABS TK 1 T PO  Q 8 H  0   No facility-administered medications prior to visit.    ROS Review of Systems  Constitutional: Negative for appetite change, fatigue and unexpected weight change.  HENT: Negative for congestion, nosebleeds, sneezing, sore throat and trouble swallowing.   Eyes: Negative for itching and visual disturbance.  Respiratory: Negative for cough.   Cardiovascular: Negative for chest pain, palpitations and leg swelling.  Gastrointestinal: Negative for nausea, diarrhea, blood in stool and abdominal distention.  Genitourinary: Negative for frequency and hematuria.  Musculoskeletal: Negative for back pain, joint swelling, gait problem and neck pain.  Skin: Negative for rash.  Neurological: Negative for dizziness, tremors, speech difficulty and weakness.  Psychiatric/Behavioral: Negative for sleep disturbance, dysphoric mood and agitation. The patient is not nervous/anxious.     Objective:  BP 130/80 mmHg  Pulse 64  Wt 199 lb (90.266 kg)  SpO2 97%  BP Readings from Last 3  Encounters:  02/23/15 130/80  12/30/14 128/62  03/10/11 110/74    Wt Readings from Last 3 Encounters:  02/23/15 199 lb (90.266 kg)  12/30/14 200 lb 12.8 oz (91.082 kg)  03/10/11 220 lb (99.791 kg)    Physical Exam  Constitutional: He is oriented to person, place, and time. He appears well-developed. No distress.  NAD  HENT:  Mouth/Throat: Oropharynx is clear and moist.  Eyes: Conjunctivae are normal. Pupils are equal, round, and reactive to light.  Neck: Normal range of motion. No JVD present. No thyromegaly present.  Cardiovascular: Normal rate, regular rhythm, normal heart sounds and intact distal pulses.  Exam reveals no gallop and no friction rub.   No murmur heard. Pulmonary/Chest: Effort normal and breath sounds normal. No respiratory distress. He has no wheezes. He has no rales. He exhibits no tenderness.  Abdominal: Soft. Bowel sounds are normal. He exhibits no distension and no mass. There is no tenderness. There is no rebound and no guarding.  Musculoskeletal: Normal range of motion. He exhibits tenderness. He exhibits no edema.  Lymphadenopathy:    He has no cervical adenopathy.  Neurological: He is alert and oriented to person, place, and time. He has normal reflexes. No cranial nerve deficit. He exhibits normal muscle tone. He displays a negative Romberg sign. Coordination and gait normal.  Skin: Skin is warm and dry. No rash noted. There is erythema.  Psychiatric: He has a normal mood and affect. His behavior is normal.  Judgment and thought content normal.   3x4 cm eryth tender swelling on the central lower back    Procedure note:  Incision and Drainage of an Abscess   Indication : a localized collection of pus that is tender and not spontaneously resolving.    Risks including unsuccessful procedure , possible need for a repeat procedure due to pus accumulation, scar formation, and others as well as benefits were explained to the patient in detail. Written  consent was obtained/signed.    The patient was placed in a decubitus position. The area of an abscess was prepped with povidone-iodine and draped in a sterile fashion. Local anesthesia with   2    cc of 2% lidocaine and epinephrine  was administered.  1.5 cm incision with #11strait blade was made. About 3 cc of purulent material was expressed. The abscess cavity was explored with a sterile hemostat and the walled- off pockets and septae were broken down bluntly. The cavity was irrigated with the rest of the anesthetic in the syringe and packed with 3 inches of  the iodoform gauze.   The wound was dressed with Silvadineand Telfa pad.  Tolerated well. Complications: None.   Wound instructions provided.   Wound instructions : change dressing once a day or twice a day is needed. Change dressing after  shower in the morning.  Pat dry the wound with gauze. Pull out one inch of packing everyday and cut it off. Re-dress wound with antibiotic ointment and Telfa pad or a Band-Aid of appropriate size.   Please contact us if you notice a recollection of pus in the abscess fever and chills increased pain redness red streaks near the abscess increased swelling in the area.     Please contact us if you notice a recollection of pus in the abscess fever and chills increased pain redness red streaks near the abscess increased swelling in the area.     Lab Results  Component Value Date   WBC 3.7* 03/03/2011   HGB 13.1 03/03/2011   HCT 38.5* 03/03/2011   PLT 167.0 03/03/2011   GLUCOSE 108* 12/30/2014   CHOL 200 12/30/2014   TRIG 78.0 12/30/2014   HDL 55.60 12/30/2014   LDLDIRECT 172.8 12/24/2007   LDLCALC 128* 12/30/2014   ALT 12 12/30/2014   AST 17 12/30/2014   NA 142 12/30/2014   K 4.9 12/30/2014   CL 103 12/30/2014   CREATININE 0.88 12/30/2014   BUN 20 12/30/2014   CO2 29 12/30/2014   TSH 1.69 03/03/2011   PSA 0.52 01/12/2010    No results found.  Assessment & Plan:   There are no  diagnoses linked to this encounter. I am having Mr. Pocius start on silver sulfADIAZINE and doxycycline. I am also having him maintain his celecoxib, fentaNYL, acyclovir, Azelastine HCl, fluticasone, esomeprazole, and Oxycodone HCl.  Meds ordered this encounter  Medications  . silver sulfADIAZINE (SILVADENE) 1 % cream    Sig: Apply 1 application topically 2 (two) times daily. With dressing changes    Dispense:  20 g    Refill:  0  . doxycycline (VIBRA-TABS) 100 MG tablet    Sig: Take 1 tablet (100 mg total) by mouth 2 (two) times daily.    Dispense:  20 tablet    Refill:  0     Follow-up: No Follow-up on file.  Sonda PrimesAlex Plotnikov, MD

## 2015-02-23 NOTE — Progress Notes (Signed)
Pre visit review using our clinic review tool, if applicable. No additional management support is needed unless otherwise documented below in the visit note. 

## 2015-02-23 NOTE — Assessment & Plan Note (Signed)
See procedure Silvadene cream Doxy x 10 d

## 2015-06-29 DIAGNOSIS — H40013 Open angle with borderline findings, low risk, bilateral: Secondary | ICD-10-CM | POA: Diagnosis not present

## 2015-06-29 DIAGNOSIS — H35412 Lattice degeneration of retina, left eye: Secondary | ICD-10-CM | POA: Diagnosis not present

## 2015-08-07 ENCOUNTER — Encounter: Payer: Self-pay | Admitting: Geriatric Medicine

## 2015-09-09 ENCOUNTER — Ambulatory Visit (INDEPENDENT_AMBULATORY_CARE_PROVIDER_SITE_OTHER): Payer: BLUE CROSS/BLUE SHIELD | Admitting: Family

## 2015-09-09 ENCOUNTER — Encounter: Payer: Self-pay | Admitting: Family

## 2015-09-09 VITALS — BP 110/80 | HR 56 | Temp 98.0°F | Ht 70.0 in | Wt 208.2 lb

## 2015-09-09 DIAGNOSIS — B353 Tinea pedis: Secondary | ICD-10-CM | POA: Diagnosis not present

## 2015-09-09 MED ORDER — CLOTRIMAZOLE 1 % EX OINT: 1.0000 "application " | TOPICAL_OINTMENT | Freq: Two times a day (BID) | CUTANEOUS | Status: DC

## 2015-09-09 NOTE — Progress Notes (Signed)
Subjective:    Patient ID: Lance Roman, male    DOB: 09/23/1960, 54 y.o.   MRN: [id number]   Lance Roman is a 55 y.o. male who presents today for an acute visit.    HPI Comments: Patient here for evaluation of right foot pain located between the fourth and fifth toe for past couple of weeks, worsening. Has been taking ibuprofen and applying over-the-counter athlete's foot cream with little improvement. He wears steel-toe work boots and states his feet sweat during the day. No injury.   History of gout.  Past Medical History  Diagnosis Date  . Chronic pain syndrome   . Osteoarthritis   . Allergic rhinitis   . GERD (gastroesophageal reflux disease)   . S/P appendectomy   . DDD (degenerative disc disease), lumbosacral   . Hyperlipidemia    Allergies: Review of patient's allergies indicates no known allergies. Current Outpatient Prescriptions on File Prior to Visit  Medication Sig Dispense Refill  . Azelastine HCl (ASTEPRO) 0.15 % SOLN Place 1 Act into the nose at bedtime. 30 mL 11  . celecoxib (CELEBREX) 200 MG capsule Take 200 mg by mouth 2 (two) times daily.      Marland Kitchen esomeprazole (NEXIUM) 40 MG capsule Take 1 capsule (40 mg total) by mouth every 8 (eight) hours. 100 capsule 3  . fluticasone (FLONASE) 50 MCG/ACT nasal spray Place 2 sprays into the nose 2 (two) times daily. 16 g 11  . Oxycodone HCl 10 MG TABS TK 1 T PO  Q 8 H  0  . acyclovir (ZOVIRAX) 200 MG capsule Take 1 capsule (200 mg total) by mouth 5 (five) times daily. (Patient not taking: Reported on 09/09/2015) 50 capsule 4  . fentaNYL (DURAGESIC) 75 MCG/HR Place 1 patch onto the skin. As directed     . silver sulfADIAZINE (SILVADENE) 1 % cream Apply 1 application topically 2 (two) times daily. With dressing changes (Patient not taking: Reported on 09/09/2015) 20 g 0   No current facility-administered medications on file prior to visit.    Social History  Substance Use Topics  . Smoking status: Former Games developer  .  Smokeless tobacco: None  . Alcohol Use: No    Review of Systems  Constitutional: Negative for fever and chills.  Respiratory: Negative for cough.   Cardiovascular: Negative for chest pain and palpitations.  Gastrointestinal: Negative for nausea and vomiting.  Skin: Positive for rash.      Objective:    BP 110/80 mmHg  Pulse 56  Temp(Src) 98 F (36.7 C) (Oral)  Ht  (1.778 m)  Wt 208 lb 3 oz (94.433 kg)  BMI 29.87 kg/m2  SpO2 98%   Physical Exam  Constitutional: He appears well-developed and well-nourished.  Cardiovascular: Regular rhythm and normal heart sounds.   Pulmonary/Chest: Effort normal and breath sounds normal. No respiratory distress. He has no wheezes. He has no rhonchi. He has no rales.  Lymphadenopathy:       Head (left side): No submandibular and no preauricular adenopathy present.  Neurological: He is alert.  Skin: Skin is warm and dry.     Psychiatric: He has a normal mood and affect. His speech is normal and behavior is normal.  Vitals reviewed.      Assessment & Plan:  1. Tinea pedis, right Suspect tinea pedis. Trial of topical antifungal.   .- Clotrimazole 1 % OINT; Apply 1 application topically 2 (two) times daily.  Dispense: 1 Tube; Refill: 1  I have discontinued Lance Roman's doxycycline. I am also having him maintain his celecoxib, fentaNYL, acyclovir, Azelastine HCl, fluticasone, esomeprazole, Oxycodone HCl, silver sulfADIAZINE, fentaNYL, fentaNYL, and olopatadine.   Meds ordered this encounter  Medications  . fentaNYL (DURAGESIC - DOSED MCG/HR) 12 MCG/HR    Sig: UNWRAP AND PLACE 1 PATCH ON SKIN AND CHANGE Q 48 H    Refill:  0  . fentaNYL (DURAGESIC - DOSED MCG/HR) 50 MCG/HR    Sig: UNWRAP AND PLACE ONE PATCH ON SKIN AND CHANGE Q 48 H    Refill:  0  . olopatadine (PATANOL) 0.1 % ophthalmic solution    Sig:      Start medications as prescribed and explained to patient on After Visit Summary ( AVS). Risks, benefits, and  alternatives of the medications and treatment plan prescribed today were discussed, and patient expressed understanding.   Education regarding symptom management and diagnosis given to patient.   Follow-up:Plan follow-up and return precautions given if any worsening symptoms or change in condition.   Continue to follow with Myrlene BrokerElizabeth A Crawford, MD for routine health maintenance.   Darlina RumpfSamuel C Boccio and I agreed with plan.   Rennie PlowmanMargaret Aiyonna Lucado, FNP

## 2015-09-09 NOTE — Patient Instructions (Signed)

## 2015-09-09 NOTE — Progress Notes (Signed)
Pre visit review using our clinic review tool, if applicable. No additional management support is needed unless otherwise documented below in the visit note. 

## 2015-09-10 ENCOUNTER — Encounter: Payer: Self-pay | Admitting: Family

## 2015-12-31 ENCOUNTER — Encounter: Payer: BLUE CROSS/BLUE SHIELD | Admitting: Internal Medicine

## 2016-01-05 DIAGNOSIS — H40023 Open angle with borderline findings, high risk, bilateral: Secondary | ICD-10-CM | POA: Diagnosis not present

## 2016-01-05 DIAGNOSIS — H25013 Cortical age-related cataract, bilateral: Secondary | ICD-10-CM | POA: Diagnosis not present

## 2016-01-05 DIAGNOSIS — H35432 Paving stone degeneration of retina, left eye: Secondary | ICD-10-CM | POA: Diagnosis not present

## 2016-01-05 DIAGNOSIS — H2513 Age-related nuclear cataract, bilateral: Secondary | ICD-10-CM | POA: Diagnosis not present

## 2016-07-07 DIAGNOSIS — H01029 Squamous blepharitis unspecified eye, unspecified eyelid: Secondary | ICD-10-CM | POA: Diagnosis not present

## 2016-07-07 DIAGNOSIS — H40023 Open angle with borderline findings, high risk, bilateral: Secondary | ICD-10-CM | POA: Diagnosis not present

## 2016-12-02 ENCOUNTER — Ambulatory Visit (INDEPENDENT_AMBULATORY_CARE_PROVIDER_SITE_OTHER): Payer: BLUE CROSS/BLUE SHIELD | Admitting: Nurse Practitioner

## 2016-12-02 ENCOUNTER — Encounter: Payer: Self-pay | Admitting: Nurse Practitioner

## 2016-12-02 VITALS — BP 130/90 | HR 63 | Temp 98.3°F | Ht 70.0 in | Wt 190.0 lb

## 2016-12-02 DIAGNOSIS — G8929 Other chronic pain: Secondary | ICD-10-CM

## 2016-12-02 DIAGNOSIS — M545 Low back pain: Secondary | ICD-10-CM

## 2016-12-02 MED ORDER — MORPHINE SULFATE 15 MG PO TABS: 15.0000 mg | ORAL_TABLET | Freq: Four times a day (QID) | ORAL | 0 refills | Status: DC | PRN

## 2016-12-02 MED ORDER — MORPHINE SULFATE ER 30 MG PO TBCR: 30.0000 mg | EXTENDED_RELEASE_TABLET | Freq: Two times a day (BID) | ORAL | 0 refills | Status: DC

## 2016-12-02 NOTE — Patient Instructions (Signed)
You will need to get further refill from Pain clinic.  Contact case manager and/or [facility] for another appointment prior to running out of medication.

## 2016-12-02 NOTE — Progress Notes (Signed)
Subjective:  Patient ID: Lance Roman, male    DOB: [birth year]  Age: 56 y.o. MRN: [id number]  CC: Follow-up (medication consult:morphine sul 15mg , morphine 30 mg er/ has chronic back pain--old doc gave theses med--went to see the new back doctor yesterday refuse to see pt- social worker will call with appt with another doctor. )   HPI  Indication for chronic opioid: back pain related to work injury. Rate Pain (1-10 scale): 10/10without medication Medication and dose: MS contin 30mg  and Morphine IR 15mg  # pills per month: 90 and 120 respectively, last dispense 10/30/2016 Date narcotic database last reviewed (include red flags): 12/02/2016, no red flags. Pharm[place] [facility], [place].  He needs medication refilled because [facility]2016. He is waiting for case manager with Worker's Comp to schedule appt with another pain clinic. States he has medication for today only, and has not yet heard from case Production designer, theatre/television/filmmanager.  Outpatient Medications Prior to Visit  Medication Sig Dispense Refill  . esomeprazole (NEXIUM) 40 MG capsule Take 1 capsule (40 mg total) by mouth every 8 (eight) hours. 100 capsule 3  . olopatadine (PATANOL) 0.1 % ophthalmic solution     . celecoxib (CELEBREX) 200 MG capsule Take 200 mg by mouth 2 (two) times daily.      Marland Kitchen. acyclovir (ZOVIRAX) 200 MG capsule Take 1 capsule (200 mg total) by mouth 5 (five) times daily. (Patient not taking: Reported on 09/09/2015) 50 capsule 4  . Azelastine HCl (ASTEPRO) 0.15 % SOLN Place 1 Act into the nose at bedtime. (Patient not taking: Reported on 12/02/2016) 30 mL 11  . Clotrimazole 1 % OINT Apply 1 application topically 2 (two) times daily. (Patient not taking: Reported on 12/02/2016) 1 Tube 1  . fentaNYL (DURAGESIC - DOSED MCG/HR) 12 MCG/HR UNWRAP AND PLACE 1 PATCH ON SKIN AND CHANGE Q 48 H  0  . fentaNYL (DURAGESIC - DOSED MCG/HR) 50 MCG/HR UNWRAP AND PLACE ONE PATCH ON SKIN AND CHANGE Q 48 H  0  .  fentaNYL (DURAGESIC) 75 MCG/HR Place 1 patch onto the skin. As directed     . fluticasone (FLONASE) 50 MCG/ACT nasal spray Place 2 sprays into the nose 2 (two) times daily. (Patient not taking: Reported on 12/02/2016) 16 g 11  . Oxycodone HCl 10 MG TABS TK 1 T PO  Q 8 H  0  . silver sulfADIAZINE (SILVADENE) 1 % cream Apply 1 application topically 2 (two) times daily. With dressing changes (Patient not taking: Reported on 09/09/2015) 20 g 0   No facility-administered medications prior to visit.     ROS See HPI  Objective:  BP 130/90   Pulse 63   Temp 98.3 F (36.8 C)   Ht 5\' 10"  (1.778 m)   Wt 190 lb (86.2 kg)   SpO2 99%   BMI 27.26 kg/m   BP Readings from Last 3 Encounters:  12/02/16 130/90  09/09/15 110/80  02/23/15 130/80    Wt Readings from Last 3 Encounters:  12/02/16 190 lb (86.2 kg)  09/09/15 208 lb 3 oz (94.4 kg)  02/23/15 199 lb (90.3 kg)    Physical Exam  Constitutional: He is oriented to person, place, and time. No distress.  Cardiovascular: Normal rate.   Pulmonary/Chest: Effort normal.  Neurological: He is alert and oriented to person, place, and time.  Psychiatric: He has a normal mood and affect. His behavior is normal.  Vitals reviewed.   Lab Results  Component Value Date  WBC 3.7 (L) 03/03/2011   HGB 13.1 03/03/2011   HCT 38.5 (L) 03/03/2011   PLT 167.0 03/03/2011   GLUCOSE 108 (H) 12/30/2014   CHOL 200 12/30/2014   TRIG 78.0 12/30/2014   HDL 55.60 12/30/2014   LDLDIRECT 172.8 12/24/2007   LDLCALC 128 (H) 12/30/2014   ALT 12 12/30/2014   AST 17 12/30/2014   NA 142 12/30/2014   K 4.9 12/30/2014   CL 103 12/30/2014   CREATININE 0.88 12/30/2014   BUN 20 12/30/2014   CO2 29 12/30/2014   TSH 1.69 03/03/2011   PSA 0.52 01/12/2010    No results found.  Assessment & Plan:   Lance Roman was seen today for follow-up.  Diagnoses and all orders for this visit:  Chronic bilateral low back pain, with sciatica presence unspecified -     morphine  (MS CONTIN) 30 MG 12 hr tablet; Take 1 tablet (30 mg total) by mouth every 12 (twelve) hours. -     morphine (MSIR) 15 MG tablet; Take 1 tablet (15 mg total) by mouth every 6 (six) hours as needed for severe pain.   I have discontinued Lance Roman's celecoxib, fentaNYL, acyclovir, Azelastine HCl, fluticasone, Oxycodone HCl, silver sulfADIAZINE, fentaNYL, fentaNYL, and Clotrimazole. I have also changed his morphine and morphine. Additionally, I am having him maintain his esomeprazole and olopatadine.  Meds ordered this encounter  Medications  . DISCONTD: morphine (MSIR) 15 MG tablet    Sig: TK 1 T PO QID PRN    Refill:  0  . DISCONTD: morphine (MS CONTIN) 30 MG 12 hr tablet    Sig: TK 1 T PO Q 8 H    Refill:  0  . morphine (MS CONTIN) 30 MG 12 hr tablet    Sig: Take 1 tablet (30 mg total) by mouth every 12 (twelve) hours.    Dispense:  14 tablet    Refill:  0    Order Specific Question:   Supervising Provider    Answer:   Tresa Garter [[id number]]  . morphine (MSIR) 15 MG tablet    Sig: Take 1 tablet (15 mg total) by mouth every 6 (six) hours as needed for severe pain.    Dispense:  28 tablet    Refill:  0    Order Specific Question:   Supervising Provider    Answer:   Tresa Garter [[id number]]    Follow-up: No Follow-up on file.  Alysia Penna, NP

## 2016-12-08 ENCOUNTER — Encounter: Payer: Self-pay | Admitting: Internal Medicine

## 2016-12-08 ENCOUNTER — Ambulatory Visit (INDEPENDENT_AMBULATORY_CARE_PROVIDER_SITE_OTHER): Payer: BLUE CROSS/BLUE SHIELD | Admitting: Internal Medicine

## 2016-12-08 VITALS — BP 130/80 | HR 55 | Temp 98.0°F | Ht 70.0 in | Wt 192.0 lb

## 2016-12-08 DIAGNOSIS — G8929 Other chronic pain: Secondary | ICD-10-CM

## 2016-12-08 DIAGNOSIS — Z23 Encounter for immunization: Secondary | ICD-10-CM

## 2016-12-08 DIAGNOSIS — M545 Low back pain: Secondary | ICD-10-CM

## 2016-12-08 MED ORDER — MORPHINE SULFATE 15 MG PO TABS: 15.0000 mg | ORAL_TABLET | Freq: Four times a day (QID) | ORAL | 0 refills | Status: DC | PRN

## 2016-12-08 MED ORDER — MORPHINE SULFATE ER 30 MG PO TBCR: 30.0000 mg | EXTENDED_RELEASE_TABLET | Freq: Two times a day (BID) | ORAL | 0 refills | Status: DC

## 2016-12-08 NOTE — Progress Notes (Signed)
   Subjective:    Patient ID: Lance Roman, male    DOB: [birth year], 56 y.o.   MRN: [id number]  HPI The patient is a 56 YO man coming in for pain management problem. His pain management specialist was not covered by workman's comp and they are setting him up with another. He has run out of the 5 day supply given at the last visit and wonders if we could prescribe until he is in with them. Denies overuse, sedation. No dose changes in some time. Denies new or worsening pain. 10/10 pain without his medication.   Review of Systems  Constitutional: Negative for activity change, appetite change, fatigue, fever and unexpected weight change.  Respiratory: Negative.   Cardiovascular: Negative.   Gastrointestinal: Negative.   Musculoskeletal: Positive for arthralgias, back pain and myalgias. Negative for gait problem.  Skin: Negative.   Neurological: Negative.       Objective:   Physical Exam  Constitutional: He is oriented to person, place, and time. He appears well-developed and well-nourished.  HENT:  Head: Normocephalic and atraumatic.  Eyes: EOM are normal.  Neck: Normal range of motion.  Cardiovascular: Normal rate and regular rhythm.   Pulmonary/Chest: Effort normal and breath sounds normal.  Abdominal: Soft.  Musculoskeletal: Normal range of motion.  Neurological: He is alert and oriented to person, place, and time.   Vitals:   12/08/16 1054  BP: 130/80  Pulse: (!) 55  Temp: 98 F (36.7 C)  TempSrc: Oral  SpO2: 100%  Weight: 192 lb (87.1 kg)  Height: 5\' 10"  (1.778 m)      Assessment & Plan:  Flu shot given at visit.

## 2016-12-08 NOTE — Patient Instructions (Signed)
We have given you the medicine today to cover you until you get in to the pain medicine doctor.

## 2016-12-08 NOTE — Assessment & Plan Note (Signed)
Rx for morphine IR and mscontin for 1 month supply until he can get in with new pain management doctor. No red flags and [place] narcotic registry without red flags. Stable dosing.

## 2016-12-11 NOTE — Progress Notes (Signed)
Patient's Name: Lance Roman  MRN: [id number]  Referring Provider: [name], MD  DOB: 09/08/1960  PCP: Hoyt Koch, MD  DOS: 12/12/2016  Note by: Gaspar Cola, MD  Service setting: Ambulatory outpatient  Specialty: Interventional Pain Management  Location: [facility] (AMB) Pain Management Facility  Visit type: Initial Patient Evaluation  Patient type: New Patient   Primary Reason(s) for Visit: Encounter for initial evaluation of one or more chronic problems (new to examiner) potentially causing chronic pain, and posing a threat to normal musculoskeletal function. (Level of risk: High) CC: Back Pain (lower)  HPI  Lance Roman is a 56 y.o. year old, male patient, who comes today to see [place] for the first time for an initial evaluation of his chronic pain. He has GERD; Chronic low back pain (Primary Area of Pain) (Bilateral) (L>R); Pharmacologic therapy; Infected sebaceous cyst; Disorder of skeletal system; Problems influencing health status; Opiate use (150 MME/day); Long term prescription opiate use; Chronic pain syndrome; Muscle cramps; Long-term current use of opiate analgesic; Chronic lower extremity pain (Secondary Area of Pain) (Left); and Chronic lumbar radicular pain (L5) (Left) on his problem list. Today he comes in for evaluation of his Back Pain (lower)  Pain Assessment: Location: Lower Back Radiating: left leg, down to the foot Onset: More than a month ago Duration: Chronic pain Quality: Aching Severity: 5 /10 (self-reported pain score)  Note: Reported level is inconsistent with clinical observations. Clinically the patient looks like a 2/10 Information on the proper use of the pain scale provided to the patient today Effect on ADL: it usually doesn't effect daily activities Timing: Constant Modifying factors: exercise, rest, medications  Onset and Duration: Sudden and Date of onset: 08-07-1998 Cause of pain: Worker's Compensation Injury Severity: No change since  onset, NAS-11 at its worse: 10/10, NAS-11 at its best: 1/10, NAS-11 now: 7/10 and NAS-11 on the average: 2/10 Timing: Morning, Not influenced by the time of the day and After a period of immobility Aggravating Factors: Kneeling, Lifiting, Prolonged sitting and Prolonged standing Alleviating Factors: Stretching, Lying down, Medications, Resting and Standing Associated Problems: Fatigue, Numbness, Spasms, Weakness, Pain that wakes patient up and Pain that does not allow patient to sleep Quality of Pain: Aching, Agonizing, Cramping, Exhausting, Tender, Throbbing and Tingling Previous Examinations or Tests: CT scan, Discogram, Nerve block, X-rays, Nerve conduction test, Neurological evaluation, Neurosurgical evaluation, Orthopedic evaluation, Chiropractic evaluation and Psychiatric evaluation Previous Treatments: Narcotic medications, Physical Therapy, Spinal cord stimulator, Stretching exercises and Trigger point injections  The patient comes into the clinics today for the first time for a chronic pain management evaluation. According to the patient his primary pain is that of the lower back with the left side being worse than the right. He indicates having had one back surgery by Dr. Deri Fuelling, around October 2000. At the time his symptoms included low back pain and left lower extremity pain all the way down to his heel but at the time it was not getting into his foot. He indicates that the surgery did not help his pain at all. He admits to having participated in physical therapy on a daily basis for approximately 2 weeks after the surgery and this type of exercising seems to have helped. He denies any recent x-rays or MRIs that he indicates having had nerve blocks done before and after the surgery. He mentioned having had some blocks done by Dr. Darryl Nestle.  His secondary area of pain is that of the left lower extremity with  the pain going down to the heel. This pain starts in the back of the leg and  travels to the lateral portion of the as it goes down following what seems to be a dermatomal distribution. He does indicate that the pain occasionally will go to the top of the foot and the area between the big toe and the second toe in what seems to be an L5 dermatomal distribution. He denies any lower extremity surgery, nerve blocks, recent x-rays, physical therapy, but does indicate having had some nerve conduction test at [facility] neurological before the surgery.  Currently the patient describes treating his pain using Celebrex, extended release morphine 30 mg 3 times a day, immediate release morphine 15 mg 4 times a day, and he indicates that he has been on this particular regimen for approximately 4 months. However, the patient also indicates that he has been on opioids for over 20 years, starting before his low back surgery. He also indicates that he has taken those opioids continuously and has never had a "drug holiday" in the past 20 years.  The patient describes having being sent here due to the fact that [facility] closed their doors in [place] and he was referred here. He also indicates having a nonfunctional lumbar spinal cord stimulator that was initially placed by Dr. Darryl Nestle. According to the patient the first one did not provide him with any significant relief of the pain and this was subsequently replaced by a second one that at this point is completely nonfunctional and he also indicates that he cannot have an MRI due to the implant. In addition to have pain in a patient no [employer], he also describes to have gone to a another pain clinic in [facility]. He describes having had multiple procedures including an idea that by Dr. Darryl Nestle which apparently helped for a short period of time. He also having indicates having had several nerve blocks that seemed to help for a couple weeks but the pain always returned. One is his biggest problems is muscle spasms and muscle pain.  Today I took  the time to provide the patient with information regarding my pain practice. The patient was informed that my practice is divided into two sections: an interventional pain management section, as well as a completely separate and distinct medication management section. I explained that I have procedure days for my interventional therapies, and evaluation days for follow-ups and medication management. Because of the amount of documentation required during both, they are kept separated. This means that there is the possibility that he may be scheduled for a procedure on one day, and medication management the next. I have also informed him that because of staffing and facility limitations, I no longer take patients for medication management only. To illustrate the reasons for this, I gave the patient the example of surgeons, and how inappropriate it would be to refer a patient to his/her care, just to write for the post-surgical antibiotics on a surgery done by a different surgeon.   Because interventional pain management is my board-certified specialty, the patient was informed that joining my practice means that they are open to any and all interventional therapies. I made it clear that this does not mean that they will be forced to have any procedures done. What this means is that I believe interventional therapies to be essential part of the diagnosis and proper management of chronic pain conditions. Therefore, patients not interested in these interventional alternatives will be better  served under the care of a different practitioner.  The patient was also made aware of my Comprehensive Pain Management Safety Guidelines where by joining my practice, they limit all of their nerve blocks and joint injections to those done by our practice, for as long as we are retained to manage their care.   Historic Controlled Substance Pharmacotherapy Review  PMP and historical list of controlled substances: Morphine IR 15  mg; morphine ER 30 mg; Nucynta ER 150 mg; oxycodone 10 mg; fentanyl 50 g, 12 g, 75 g; oxycodone 5 mg Highest opioid analgesic regimen found: Duragesic 75 g per hour + oxycodone IR 10 mg every 8 hours (315 MME/day)  Most recent opioid analgesic: Morphine ER 30 mg every 8 hours + morphine IR 15 mg every 6 hours (150 MME/day)  Current opioid analgesics: Morphine ER 30 mg every 8 hours + morphine IR 15 mg every 6 hours (150 MME/day)   Highest recorded MME/day: 315 mg/day MME/day: 150 mg/day Medications: The patient did not bring the medication(s) to the appointment, as requested in our "New Patient Package" Pharmacodynamics: Desired effects: Analgesia: The patient reports >50% benefit. Reported improvement in function: The patient reports medication allows him to accomplish basic ADLs. Clinically meaningful improvement in function (CMIF): Sustained CMIF goals met Perceived effectiveness: Described as relatively effective, allowing for increase in activities of daily living (ADL) Undesirable effects: Side-effects or Adverse reactions: None reported Historical Monitoring: The patient  reports that he does not use drugs. List of all UDS Test(s): No results found for: MDMA, COCAINSCRNUR, PCPSCRNUR, PCPQUANT, CANNABQUANT, THCU, [place] List of all Serum Drug Screening Test(s):  No results found for: AMPHSCRSER, BARBSCRSER, BENZOSCRSER, COCAINSCRSER, PCPSCRSER, PCPQUANT, THCSCRSER, CANNABQUANT, OPIATESCRSER, OXYSCRSER, PROPOXSCRSER Historical Background Evaluation: [place] PDMP: Six (6) year initial data search conducted.             [place] Department of public safety, offender search: Editor, commissioning Information) Non-contributory Risk Assessment Profile: Aberrant behavior: None observed or detected today Risk factors for fatal opioid overdose: None identified today Fatal overdose hazard ratio (HR): 2.04 for doses equal to, or higher than 100 MME/day Non-fatal overdose hazard ratio (HR): 2.88 for doses equal to, or  higher than 200 MME/day Risk of opioid abuse or dependence: 0.7-3.0% with doses ? 36 MME/day and 6.1-26% with doses ? 120 MME/day. Substance use disorder (SUD) risk level: Pending results of Medical Psychology Evaluation for SUD Opioid risk tool (ORT) (Total Score): 0     Opioid Risk Tool - 12/12/16 1041      Family History of Substance Abuse   Alcohol Negative   Illegal Drugs Negative   Rx Drugs Negative     Personal History of Substance Abuse   Alcohol Negative   Illegal Drugs Negative   Rx Drugs Negative     Age   Age between 24-45 years  No     History of Preadolescent Sexual Abuse   History of Preadolescent Sexual Abuse Negative or Male     Psychological Disease   Psychological Disease Negative   Depression Negative     Total Score   Opioid Risk Tool Scoring 0   Opioid Risk Interpretation Low Risk     ORT Scoring interpretation table:  Score <3 = Low Risk for SUD  Score between 4-7 = Moderate Risk for SUD  Score >8 = High Risk for Opioid Abuse   PHQ-2 Depression Scale:  Total score: 0  PHQ-2 Scoring interpretation table: (Score and probability of major depressive disorder)  Score 0 =  No depression  Score 1 = 15.4% Probability  Score 2 = 21.1% Probability  Score 3 = 38.4% Probability  Score 4 = 45.5% Probability  Score 5 = 56.4% Probability  Score 6 = 78.6% Probability   PHQ-9 Depression Scale:  Total score: 0  PHQ-9 Scoring interpretation table:  Score 0-4 = No depression  Score 5-9 = Mild depression  Score 10-14 = Moderate depression  Score 15-19 = Moderately severe depression  Score 20-27 = Severe depression (2.4 times higher risk of SUD and 2.89 times higher risk of overuse)   Pharmacologic Plan: Pending ordered tests and/or consults            Initial impression: Pending review of available data and ordered tests.  Meds   Current Outpatient Prescriptions:  .  celecoxib (CELEBREX) 200 MG capsule, Take 200 mg by mouth 2 (two) times daily., Disp:  , Rfl:  .  esomeprazole (NEXIUM) 40 MG capsule, Take 1 capsule (40 mg total) by mouth every 8 (eight) hours., Disp: 100 capsule, Rfl: 3 .  morphine (MS CONTIN) 30 MG 12 hr tablet, Take 1 tablet (30 mg total) by mouth every 12 (twelve) hours., Disp: 90 tablet, Rfl: 0 .  morphine (MSIR) 15 MG tablet, Take 1 tablet (15 mg total) by mouth every 6 (six) hours as needed for severe pain., Disp: 120 tablet, Rfl: 0 .  olopatadine (PATANOL) 0.1 % ophthalmic solution, , Disp: , Rfl:   Imaging Review  Thoracic Imaging: Thoracic DG 2-3 views:  Results for orders placed in visit on 04/25/02  DG Thoracic Spine 1 View   Narrative FINDINGS * CORRECTED ORDER/REFERRING PHYSICIAN ** DR. Nicole Kindred MELOY  [address] [place] [address]       Adrian [place], [place] CLINICAL DATA:  SPINAL CORD STIMULATOR LEAD PLACEMENT. TWO VIEW, THORACIC SPINE THERE ARE TWO LEADS SEEN DORSALLY LOCATED WITHIN THE SPINAL CANAL.  THESE EXTEND TO THE T9 VERTEBRAL LEVEL.  THE THORACIC VERTEBRAE SHOW MINOR DEGENERATIVE CHANGE BUT OTHERWISE HAVE A NORMAL APPEARANCE. IMPRESSION SPINAL CORD STIMULATOR WITH LEADS PRESENT WITHIN THE SPINAL CANAL DORSALLY AS DISCUSSED ABOVE. OTHERWISE, NORMAL VIEWS OF THE THORACIC SPINE ASIDE FROM MINOR DEGENERATIVE CHANGE.   Lumbosacral Imaging: Lumbar DG 2-3 views:  Results for orders placed in visit on 08/22/00  DG Lumbar Spine 2-3 Views   Narrative FINDINGS CLINICAL DATA:  STATUS-POST L4-5 FUSION, NOW FOR CHECK-UP. LUMBAR SPINE, TWO VIEWS: COMPARISON IS MADE TO June 13, 2000. THE PATIENT IS STATUS-POST POSTERIOR FUSION FROM L-4 THROUGH S-1. THE DISC SPACES REMAIN IN PLACE. THERE IS NORMAL ALIGNMENT. NO HARDWARE COMPLICATING FEATURE IS SEEN. NO CHANGE SINCE THE PRIOR EXAM. IMPRESSION NO SIGNIFICANT CHANGE IN THE POST-OP APPEARANCE OF THE LUMBOSACRAL SPINE.   Complexity Note: Imaging results reviewed. Results discussed using Layman's terms.               ROS  Cardiovascular History: No reported cardiovascular signs  or symptoms such as High blood pressure, coronary artery disease, abnormal heart rate or rhythm, heart attack, blood thinner therapy or heart weakness and/or failure Pulmonary or Respiratory History: Snoring  Neurological History: No reported neurological signs or symptoms such as seizures, abnormal skin sensations, urinary and/or fecal incontinence, being born with an abnormal open spine and/or a tethered spinal cord Review of Past Neurological Studies: No results found for this or any previous visit. Psychological-Psychiatric History: No reported psychological or psychiatric signs or symptoms such as difficulty sleeping, anxiety, depression, delusions or hallucinations (schizophrenial), mood swings (bipolar disorders) or suicidal ideations or attempts  Gastrointestinal History: No reported gastrointestinal signs or symptoms such as vomiting or evacuating blood, reflux, heartburn, alternating episodes of diarrhea and constipation, inflamed or scarred liver, or pancreas or irrregular and/or infrequent bowel movements Genitourinary History: No reported renal or genitourinary signs or symptoms such as difficulty voiding or producing urine, peeing blood, non-functioning kidney, kidney stones, difficulty emptying the bladder, difficulty controlling the flow of urine, or chronic kidney disease Hematological History: No reported hematological signs or symptoms such as prolonged bleeding, low or poor functioning platelets, bruising or bleeding easily, hereditary bleeding problems, low energy levels due to low hemoglobin or being anemic Endocrine History: No reported endocrine signs or symptoms such as high or low blood sugar, rapid heart rate due to high thyroid levels, obesity or weight gain due to slow thyroid or thyroid disease Rheumatologic History: No reported rheumatological signs and symptoms such as fatigue, joint pain, tenderness, swelling, redness, heat, stiffness, decreased range of motion, with or  without associated rash Musculoskeletal History: Negative for myasthenia gravis, muscular dystrophy, multiple sclerosis or malignant hyperthermia Work History: Working full time  Allergies  Lance Roman has No Known Allergies.  Laboratory Chemistry  Inflammation Markers (CRP: Acute Phase) (ESR: Chronic Phase) Lab Results  Component Value Date   CRP 2.5 12/12/2016   ESRSEDRATE 10 12/12/2016                 Renal Function Markers Lab Results  Component Value Date   BUN 20 12/12/2016   CREATININE 0.74 (L) 12/12/2016   GFRAA 120 12/12/2016   GFRNONAA 104 12/12/2016                 Hepatic Function Markers Lab Results  Component Value Date   AST 21 12/12/2016   ALT 12 12/30/2014   ALBUMIN 4.5 12/12/2016   ALKPHOS 66 12/12/2016   HCVAB NEGATIVE 12/30/2014                 Electrolytes Lab Results  Component Value Date   NA 140 12/12/2016   K 4.5 12/12/2016   CL 99 12/12/2016   CALCIUM 9.7 12/12/2016   MG 1.9 12/12/2016                 Neuropathy Markers Lab Results  Component Value Date   GBTDVVOH60 737 12/12/2016                 Bone Pathology Markers Lab Results  Component Value Date   ALKPHOS 66 12/12/2016   25OHVITD1 WILL FOLLOW 12/12/2016   25OHVITD2 WILL FOLLOW 12/12/2016   25OHVITD3 WILL FOLLOW 12/12/2016   CALCIUM 9.7 12/12/2016                 Coagulation Parameters Lab Results  Component Value Date   PLT 167.0 03/03/2011                 Cardiovascular Markers Lab Results  Component Value Date   HGB 13.1 03/03/2011   HCT 38.5 (L) 03/03/2011                 Note: Lab results reviewed.  [place]  Drug: Lance Roman  reports that he does not use drugs. Alcohol:  reports that he drinks alcohol. Tobacco:  reports that he has quit smoking. He uses smokeless tobacco. Medical:  has a past medical history of Allergic rhinitis; Chronic pain syndrome; DDD (degenerative disc disease), lumbosacral; GERD (gastroesophageal reflux disease); Hyperlipidemia;  Osteoarthritis; and S/P appendectomy. Family: family history includes Diabetes in his  maternal grandmother; Heart disease in his father.  Past Surgical History:  Procedure Laterality Date  . APPENDECTOMY    . repairs of bilateral ankle fractures    . SPINAL CORD STIMULATOR IMPLANT N/A   . SPINAL FUSION N/A    Active Ambulatory Problems    Diagnosis Date Noted  . GERD 03/27/2007  . Chronic low back pain (Primary Area of Pain) (Bilateral) (L>R) 03/27/2007  . Pharmacologic therapy 12/30/2014  . Infected sebaceous cyst 02/23/2015  . Disorder of skeletal system 12/12/2016  . Problems influencing health status 12/12/2016  . Opiate use (150 MME/day) 12/12/2016  . Long term prescription opiate use 12/12/2016  . Chronic pain syndrome 12/12/2016  . Muscle cramps 12/12/2016  . Long-term current use of opiate analgesic 12/12/2016  . Chronic lower extremity pain (Secondary Area of Pain) (Left) 12/12/2016  . Chronic lumbar radicular pain (L5) (Left) 12/12/2016   Resolved Ambulatory Problems    Diagnosis Date Noted  . HYPERLIPIDEMIA 01/02/2008  . VIRAL URI 04/30/2007  . ALLERGIC RHINITIS 03/27/2007  . CONTACT DERMATITIS 11/20/2007  . DYSPHAGIA UNSPECIFIED 03/27/2007  . Diarrhea 11/02/2010  . Headache, migraine, intractable 11/02/2010   Past Medical History:  Diagnosis Date  . Allergic rhinitis   . Chronic pain syndrome   . DDD (degenerative disc disease), lumbosacral   . GERD (gastroesophageal reflux disease)   . Hyperlipidemia   . Osteoarthritis   . S/P appendectomy    Constitutional Exam  General appearance: Well nourished, well developed, and well hydrated. In no apparent acute distress Vitals:   12/12/16 1033  BP: 118/77  Pulse: 65  Resp: 16  Temp: 98.8 F (37.1 C)  TempSrc: Oral  SpO2: 100%  Weight: 192 lb (87.1 kg)  Height: _0  (1.778 m)   BMI Assessment: Estimated body mass index is 27.55 kg/m as calculated from the following:   Height as of this encounter: 5'  10" (1.778 m).   Weight as of this encounter: 192 lb (87.1 kg).  BMI interpretation table: BMI level Category Range association with higher incidence of chronic pain  <18 kg/m2 Underweight   18.5-24.9 kg/m2 Ideal body weight   25-29.9 kg/m2 Overweight Increased incidence by 20%  30-34.9 kg/m2 Obese (Class I) Increased incidence by 68%  35-39.9 kg/m2 Severe obesity (Class II) Increased incidence by 136%  >40 kg/m2 Extreme obesity (Class III) Increased incidence by 254%   BMI Readings from Last 4 Encounters:  12/12/16 27.55 kg/m  12/08/16 27.55 kg/m  12/02/16 27.26 kg/m  09/09/15 29.87 kg/m   Wt Readings from Last 4 Encounters:  12/12/16 192 lb (87.1 kg)  12/08/16 192 lb (87.1 kg)  12/02/16 190 lb (86.2 kg)  09/09/15 208 lb 3 oz (94.4 kg)  Psych/Mental status: Alert, oriented x 3 (person, place, & time)       Eyes: PERLA Respiratory: No evidence of acute respiratory distress  Cervical Spine Area Exam  Skin & Axial Inspection: No masses, redness, edema, swelling, or associated skin lesions Alignment: Symmetrical Functional ROM: Unrestricted ROM      Stability: No instability detected Muscle Tone/Strength: Functionally intact. No obvious neuro-muscular anomalies detected. Sensory (Neurological): Unimpaired Palpation: No palpable anomalies              Upper Extremity (UE) Exam    Side: Right upper extremity  Side: Left upper extremity  Skin & Extremity Inspection: Skin color, temperature, and hair growth are WNL. No peripheral edema or cyanosis. No masses, redness, swelling, asymmetry, or associated skin lesions. No contractures.  Skin & Extremity Inspection: Skin color, temperature, and hair growth are WNL. No peripheral edema or cyanosis. No masses, redness, swelling, asymmetry, or associated skin lesions. No contractures.  Functional ROM: Unrestricted ROM          Functional ROM: Unrestricted ROM          Muscle Tone/Strength: Functionally intact. No obvious neuro-muscular  anomalies detected.  Muscle Tone/Strength: Functionally intact. No obvious neuro-muscular anomalies detected.  Sensory (Neurological): Unimpaired          Sensory (Neurological): Unimpaired          Palpation: No palpable anomalies              Palpation: No palpable anomalies              Specialized Test(s): Deferred         Specialized Test(s): Deferred          Thoracic Spine Area Exam  Skin & Axial Inspection: No masses, redness, or swelling Alignment: Symmetrical Functional ROM: Unrestricted ROM Stability: No instability detected Muscle Tone/Strength: Functionally intact. No obvious neuro-muscular anomalies detected. Sensory (Neurological): Unimpaired Muscle strength & Tone: No palpable anomalies  Lumbar Spine Area Exam  Skin & Axial Inspection: Well healed scar from previous spine surgery detected Alignment: Symmetrical Functional ROM: Decreased ROM      Stability: No instability detected Muscle Tone/Strength: Functionally intact. No obvious neuro-muscular anomalies detected. Sensory (Neurological): Movement-associated pain Palpation: Complains of area being tender to palpation       Provocative Tests: Lumbar Hyperextension and rotation test: Positive bilaterally for facet joint pain. Lumbar Lateral bending test: evaluation deferred today       Patrick's Maneuver: evaluation deferred today                    Gait & Posture Assessment  Ambulation: Unassisted Gait: Relatively normal for age and body habitus Posture: WNL   Lower Extremity Exam    Side: Right lower extremity  Side: Left lower extremity  Skin & Extremity Inspection: Skin color, temperature, and hair growth are WNL. No peripheral edema or cyanosis. No masses, redness, swelling, asymmetry, or associated skin lesions. No contractures.  Skin & Extremity Inspection: Skin color, temperature, and hair growth are WNL. No peripheral edema or cyanosis. No masses, redness, swelling, asymmetry, or associated skin lesions. No  contractures.  Functional ROM: Unrestricted ROM          Functional ROM: Unrestricted ROM          Muscle Tone/Strength: Able to Toe-walk & Heel-walk without problems  Muscle Tone/Strength: Able to Toe-walk & Heel-walk without problems  Sensory (Neurological): Unimpaired  Sensory (Neurological): Unimpaired  Palpation: No palpable anomalies  Palpation: No palpable anomalies   Assessment  Primary Diagnosis & Pertinent Problem List: The primary encounter diagnosis was Chronic pain syndrome. Diagnoses of Chronic bilateral low back pain with left-sided sciatica, Chronic pain of left lower extremity, Chronic radicular pain of lower extremity, Disorder of skeletal system, Problems influencing health status, Pharmacologic therapy, Muscle cramps, Long-term current use of opiate analgesic, Long term prescription opiate use, and Opiate use were also pertinent to this visit.  Visit Diagnosis (New problems to examiner): 1. Chronic pain syndrome   2. Chronic bilateral low back pain with left-sided sciatica   3. Chronic pain of left lower extremity   4. Chronic radicular pain of lower extremity   5. Disorder of skeletal system   6. Problems influencing health status   7. Pharmacologic therapy  8. Muscle cramps   9. Long-term current use of opiate analgesic   10. Long term prescription opiate use   11. Opiate use    Plan of Care (Initial workup plan)  Note: Lance Roman was reminded that as per protocol, today's visit has been an evaluation only. We have not taken over the patient's controlled substance management.  Problem-specific plan: No problem-specific Assessment & Plan notes found for this encounter.   Lab Orders     Compliance Drug Analysis, Ur     Comp. Metabolic Panel (12)     C-reactive protein     Sedimentation rate     Magnesium     25-Hydroxyvitamin D Lcms D2+D3     Vitamin B12  Imaging Orders     DG Lumbar Spine Complete W/Bend  Referral Orders     Ambulatory referral to  Psychology Procedure Orders    No procedure(s) ordered today   Pharmacotherapy (current): Medications ordered:  No orders of the defined types were placed in this encounter.  Medications administered during this visit: Lance Roman had no medications administered during this visit.   Pharmacological management options:  Opioid Analgesics: The patient was informed that there is no guarantee that he would be a candidate for opioid analgesics. The decision will be made following CDC guidelines. This decision will be based on the results of diagnostic studies, as well as Lance Roman's risk profile. The patient was rating form that should he join our practice, part of the plan will include to taper his opioids down until we can stop them for at least a period of 2 weeks for the purpose of a "Drug Holiday" to eliminate his opioid tolerance.  Membrane stabilizer: To be determined at a later time  Muscle relaxant: To be determined at a later time  NSAID: To be determined at a later time  Other analgesic(s): To be determined at a later time   Interventional management options: Lance Roman was informed that there is no guarantee that he would be a candidate for interventional therapies. The decision will be based on the results of diagnostic studies, as well as Lance Roman's risk profile.  Procedure(s) under consideration:  Removal of nonfunctional spinal cord stimulator followed by an MRI of the lumbar spine with and without contrast. Diagnostic bilateral lumbar facet block  Possible bilateral lumbar facet RFA Diagnostic caudal epidural steroid injection plus diagnostic epidurogram  Possible Racz procedure    Provider-requested follow-up: Return in about 3 weeks (around 01/02/2017) for F/U eval with Dr. Dossie Arbour after test completion.  Future Appointments Date Time Provider [place]  01/09/2017 9:15 AM Milinda Pointer, MD [facility] None    Primary Care Physician: Hoyt Koch,  MD Location: [facility] Note by: Gaspar Cola, MD Date: 12/12/2016; Time: 12:01 PM

## 2016-12-12 ENCOUNTER — Ambulatory Visit
Admission: RE | Admit: 2016-12-12 | Discharge: 2016-12-12 | Disposition: A | Discharge status: Home / Self Care | Payer: Worker's Compensation | Source: Ambulatory Visit | Attending: Pain Medicine | Admitting: Pain Medicine

## 2016-12-12 ENCOUNTER — Encounter: Payer: Self-pay | Admitting: Pain Medicine

## 2016-12-12 ENCOUNTER — Ambulatory Visit: Payer: Worker's Compensation | Attending: Pain Medicine | Admitting: Pain Medicine

## 2016-12-12 VITALS — BP 118/77 | HR 65 | Temp 98.8°F | Resp 16 | Ht 70.0 in | Wt 192.0 lb

## 2016-12-12 DIAGNOSIS — M5136 Other intervertebral disc degeneration, lumbar region: Secondary | ICD-10-CM | POA: Diagnosis not present

## 2016-12-12 DIAGNOSIS — M4327 Fusion of spine, lumbosacral region: Secondary | ICD-10-CM | POA: Diagnosis not present

## 2016-12-12 DIAGNOSIS — K219 Gastro-esophageal reflux disease without esophagitis: Secondary | ICD-10-CM | POA: Insufficient documentation

## 2016-12-12 DIAGNOSIS — M5410 Radiculopathy, site unspecified: Secondary | ICD-10-CM

## 2016-12-12 DIAGNOSIS — M899 Disorder of bone, unspecified: Secondary | ICD-10-CM | POA: Diagnosis not present

## 2016-12-12 DIAGNOSIS — M79605 Pain in left leg: Secondary | ICD-10-CM | POA: Diagnosis not present

## 2016-12-12 DIAGNOSIS — Z9889 Other specified postprocedural states: Secondary | ICD-10-CM | POA: Insufficient documentation

## 2016-12-12 DIAGNOSIS — Z87891 Personal history of nicotine dependence: Secondary | ICD-10-CM | POA: Diagnosis not present

## 2016-12-12 DIAGNOSIS — E785 Hyperlipidemia, unspecified: Secondary | ICD-10-CM | POA: Insufficient documentation

## 2016-12-12 DIAGNOSIS — M4320 Fusion of spine, site unspecified: Secondary | ICD-10-CM | POA: Diagnosis not present

## 2016-12-12 DIAGNOSIS — Z8249 Family history of ischemic heart disease and other diseases of the circulatory system: Secondary | ICD-10-CM | POA: Diagnosis not present

## 2016-12-12 DIAGNOSIS — M5442 Lumbago with sciatica, left side: Principal | ICD-10-CM

## 2016-12-12 DIAGNOSIS — L723 Sebaceous cyst: Secondary | ICD-10-CM | POA: Diagnosis not present

## 2016-12-12 DIAGNOSIS — G8929 Other chronic pain: Secondary | ICD-10-CM

## 2016-12-12 DIAGNOSIS — Z789 Other specified health status: Secondary | ICD-10-CM | POA: Diagnosis not present

## 2016-12-12 DIAGNOSIS — Z833 Family history of diabetes mellitus: Secondary | ICD-10-CM | POA: Diagnosis not present

## 2016-12-12 DIAGNOSIS — G894 Chronic pain syndrome: Secondary | ICD-10-CM | POA: Diagnosis not present

## 2016-12-12 DIAGNOSIS — Z79891 Long term (current) use of opiate analgesic: Secondary | ICD-10-CM | POA: Insufficient documentation

## 2016-12-12 DIAGNOSIS — M5137 Other intervertebral disc degeneration, lumbosacral region: Secondary | ICD-10-CM | POA: Diagnosis not present

## 2016-12-12 DIAGNOSIS — R252 Cramp and spasm: Secondary | ICD-10-CM

## 2016-12-12 DIAGNOSIS — Z79899 Other long term (current) drug therapy: Secondary | ICD-10-CM

## 2016-12-12 DIAGNOSIS — F1190 Opioid use, unspecified, uncomplicated: Secondary | ICD-10-CM | POA: Diagnosis not present

## 2016-12-12 DIAGNOSIS — J309 Allergic rhinitis, unspecified: Secondary | ICD-10-CM | POA: Insufficient documentation

## 2016-12-12 NOTE — Progress Notes (Signed)
Nursing Pain Medication Assessment:  Safety precautions to be maintained throughout the outpatient stay will include: orient to surroundings, keep bed in low position, maintain call bell within reach at all times, provide assistance with transfer out of bed and ambulation.  Medication Inspection Compliance: Pill count conducted under aseptic conditions, in front of the patient. Neither the pills nor the bottle was removed from the patient's sight at any time. Once count was completed pills were immediately returned to the patient in their original bottle.  Medication #1: Morphine ER (MSContin) Pill/Patch Count: 75 of 90 pills remain Pill/Patch Appearance: Markings consistent with prescribed medication Bottle Appearance: Standard pharmacy container. Clearly labeled. Filled Date: 09/13 / 2018 Last Medication intake:  Today  Medication #2: Morphine IR Pill/Patch Count: 105 of 120 pills remain Pill/Patch Appearance: Markings consistent with prescribed medication Bottle Appearance: Standard pharmacy container. Clearly labeled. Filled Date:09/13 / 2018 Last Medication intake:  Today

## 2016-12-12 NOTE — Patient Instructions (Signed)
____________________________________________________________________________________________  Pain Scale  Introduction: The pain score used by this practice is the Verbal Numerical Rating Scale (VNRS-11). This is an 11-point scale. It is for adults and children 10 years or older. There are significant differences in how the pain score is reported, used, and applied. Forget everything you learned in the past and learn this scoring system.  General Information: The scale should reflect your current level of pain. Unless you are specifically asked for the level of your worst pain, or your average pain. If you are asked for one of these two, then it should be understood that it is over the past 24 hours.  Basic Activities of Daily Living (ADL): Personal hygiene, dressing, eating, transferring, and using restroom.  Instructions: Most patients tend to report their level of pain as a combination of two factors, their physical pain and their psychosocial pain. This last one is also known as suffering and it is reflection of how physical pain affects you socially and psychologically. From now on, report them separately. From this point on, when asked to report your pain level, report only your physical pain. Use the following table for reference.  Pain Clinic Pain Levels (0-5/10)  Pain Level Score  Description  No Pain 0   Mild pain 1 Nagging, annoying, but does not interfere with basic activities of daily living (ADL). Patients are able to eat, bathe, get dressed, toileting (being able to get on and off the toilet and perform personal hygiene functions), transfer (move in and out of bed or a chair without assistance), and maintain continence (able to control bladder and bowel functions). Blood pressure and heart rate are unaffected. A normal heart rate for a healthy adult ranges from 60 to 100 bpm (beats per minute).   Mild to moderate pain 2 Noticeable and distracting. Impossible to hide from other  people. More frequent flare-ups. Still possible to adapt and function close to normal. It can be very annoying and may have occasional stronger flare-ups. With discipline, patients may get used to it and adapt.   Moderate pain 3 Interferes significantly with activities of daily living (ADL). It becomes difficult to feed, bathe, get dressed, get on and off the toilet or to perform personal hygiene functions. Difficult to get in and out of bed or a chair without assistance. Very distracting. With effort, it can be ignored when deeply involved in activities.   Moderately severe pain 4 Impossible to ignore for more than a few minutes. With effort, patients may still be able to manage work or participate in some social activities. Very difficult to concentrate. Signs of autonomic nervous system discharge are evident: dilated pupils (mydriasis); mild sweating (diaphoresis); sleep interference. Heart rate becomes elevated (>115 bpm). Diastolic blood pressure (lower number) rises above 100 mmHg. Patients find relief in laying down and not moving.   Severe pain 5 Intense and extremely unpleasant. Associated with frowning face and frequent crying. Pain overwhelms the senses.  Ability to do any activity or maintain social relationships becomes significantly limited. Conversation becomes difficult. Pacing back and forth is common, as getting into a comfortable position is nearly impossible. Pain wakes you up from deep sleep. Physical signs will be obvious: pupillary dilation; increased sweating; goosebumps; brisk reflexes; cold, clammy hands and feet; nausea, vomiting or dry heaves; loss of appetite; significant sleep disturbance with inability to fall asleep or to remain asleep. When persistent, significant weight loss is observed due to the complete loss of appetite and sleep deprivation.  Blood  pressure and heart rate becomes significantly elevated. Caution: If elevated blood pressure triggers a pounding headache  associated with blurred vision, then the patient should immediately seek attention at an urgent or emergency care unit, as these may be signs of an impending stroke.    Emergency Department Pain Levels (6-10/10)  Emergency Room Pain 6 Severely limiting. Requires emergency care and should not be seen or managed at an outpatient pain management facility. Communication becomes difficult and requires great effort. Assistance to reach the emergency department may be required. Facial flushing and profuse sweating along with potentially dangerous increases in heart rate and blood pressure will be evident.   Distressing pain 7 Self-care is very difficult. Assistance is required to transport, or use restroom. Assistance to reach the emergency department will be required. Tasks requiring coordination, such as bathing and getting dressed become very difficult.   Disabling pain 8 Self-care is no longer possible. At this level, pain is disabling. The individual is unable to do even the most basic activities such as walking, eating, bathing, dressing, transferring to a bed, or toileting. Fine motor skills are lost. It is difficult to think clearly.   Incapacitating pain 9 Pain becomes incapacitating. Thought processing is no longer possible. Difficult to remember your own name. Control of movement and coordination are lost.   The worst pain imaginable 10 At this level, most patients pass out from pain. When this level is reached, collapse of the autonomic nervous system occurs, leading to a sudden drop in blood pressure and heart rate. This in turn results in a temporary and dramatic drop in blood flow to the brain, leading to a loss of consciousness. Fainting is one of the bodys self defense mechanisms. Passing out puts the brain in a calmed state and causes it to shut down for a while, in order to begin the healing process.    Summary: 1. Refer to this scale when providing [place] with your pain level. 2. Be  accurate and careful when reporting your pain level. This will help with your care. 3. Over-reporting your pain level will lead to loss of credibility. 4. Even a level of 1/10 means that there is pain and will be treated at our facility. 5. High, inaccurate reporting will be documented as Symptom Exaggeration, leading to loss of credibility and suspicions of possible secondary gains such as obtaining more narcotics, or wanting to appear disabled, for fraudulent reasons. 6. Only pain levels of 5 or below will be seen at our facility. 7. Pain levels of 6 and above will be sent to the Emergency Department and the appointment cancelled. ____________________________________________________________________________________________   ____________________________________________________________________________________________  Appointment Policy Summary  It is our goal and responsibility to provide the medical community with assistance in the evaluation and management of patients with chronic pain. Unfortunately our resources are limited. Because we do not have an unlimited amount of time, or available appointments, we are required to closely monitor and manage their use. The following rules exist to maximize their use:  Patient's responsibilities: 1. Punctuality:  At what time should I arrive? You should be physically present in our office 30 minutes before your scheduled appointment. Your scheduled appointment is with your assigned healthcare provider. However, it takes 5-10 minutes to be "checked-in", and another 15 minutes for the nurses to do the admission. If you arrive to our office at the time you were given for your appointment, you will end up being at least 20-25 minutes late to your appointment with the  provider. 2. Tardiness:  What happens if I arrive only a few minutes after my scheduled appointment time? You will need to reschedule your appointment. The cutoff is your appointment time.  This is why it is so important that you arrive at least 30 minutes before that appointment. If you have an appointment scheduled for 10:00 AM and you arrive at 10:01, you will be required to reschedule your appointment.  3. Plan ahead:  Always assume that you will encounter traffic on your way in. Plan for it. If you are dependent on a driver, make sure they understand these rules and the need to arrive early. 4. Other appointments and responsibilities:  Avoid scheduling any other appointments before or after your pain clinic appointments.  5. Be prepared:  Write down everything that you need to discuss with your healthcare provider and give this information to the admitting nurse. Write down the medications that you will need refilled. Bring your pills and bottles (even the empty ones), to all of your appointments, except for those where a procedure is scheduled. 6. No children or pets:  Find someone to take care of them. It is not appropriate to bring them in. 7. Scheduling changes:  We request "advanced notification" of any changes or cancellations. 8. Advanced notification:  Defined as a time period of more than 24 hours prior to the originally scheduled appointment. This allows for the appointment to be offered to other patients. 9. Rescheduling:  When a visit is rescheduled, it will require the cancellation of the original appointment. For this reason they both fall within the category of "Cancellations".  10. Cancellations:  They require advanced notification. Any cancellation less than 24 hours before the  appointment will be recorded as a "No Show". 11. No Show:  Defined as an unkept appointment where the patient failed to notify or declare to the practice their intention or inability to keep the appointment.  Corrective process for repeat offenders:  1. Tardiness: Three (3) episodes of rescheduling due to late arrivals will be recorded as one (1) "No Show". 2. Cancellation or  reschedule: Three (3) cancellations or rescheduling will be recorded as one (1) "No Show". 3. "No Shows": Three (3) "No Shows" within a 12 month period will result in discharge from the practice.  ____________________________________________________________________________________________  ____________________________________________________________________________________________  DRUG HOLIDAYS  Definitions Tolerance: defined as the progressively decreased responsiveness to a drug. Occurs when the drug is used repeatedly and the body adapts to the continued presence of the drug. As a result, a larger dose of the drug is needed to achieve the effect originally obtained by a smaller dose. It is thought to be due to the formation of excess opioid receptors.  Drug Holiday: is when a patient stops taking a medication(s) for a period of time; anywhere from a few days to several weeks.  Withdrawals: refers to the wide range of symptoms that occur after stopping or dramatically reducing opiate drugs after heavy and prolonged use. Withdrawal symptoms do not occur to patients that use low dose opioids, or those who take the medication sporadically. Contrary to benzodiazepine (example: Valium, Xanax, etc.) or alcohol withdrawals (Delirium Tremens), opioid withdrawals are not lethal. Withdrawals are the physical manifestation of the body getting rid of the excess receptors.  Purpose To eliminate tolerance.  Duration of Holiday 14 consecutive days. (2 weeks)  Expected Symptoms Early symptoms of withdrawal include:  Agitation  Anxiety  Muscle aches  Increased tearing  Insomnia  Runny nose  Sweating  Yawning  Late symptoms of withdrawal include:  Abdominal cramping  Diarrhea  Dilated pupils  Goose bumps  Nausea  Vomiting  Opioid withdrawal reactions are very uncomfortable but are not life-threatening. Symptoms usually start within 12 hours of last opioid dose and within 30  hours of last methadone exposure.  Duration of Symptoms 48 to 72 hours for short acting medications and 2 to 14 days for methadone.  Treatment  Clonidine (Catapres) or tizanidine (Zanaflex) for agitation, sweating, tearing, runny nose.  Promethazine (Phenergan) for nausea, vomiting.  NSAIDs for pain.  Benefits  Improved effectiveness of opioids.  Decreased opioid dose needed to achieve benefits.  Improved pain with lesser dose. ____________________________________________________________________________________________

## 2016-12-13 ENCOUNTER — Telehealth: Payer: Self-pay

## 2016-12-13 NOTE — Telephone Encounter (Signed)
The workers Designer, industrial/product needs a written order for med pysc eval and labs and xrays. I have to fax them to her. Thanks

## 2016-12-16 LAB — COMP. METABOLIC PANEL (12)
AST: 21 IU/L (ref 0–40)
Albumin/Globulin Ratio: 2 (ref 1.2–2.2)
Albumin: 4.5 g/dL (ref 3.5–5.5)
Alkaline Phosphatase: 66 IU/L (ref 39–117)
BUN/Creatinine Ratio: 27 — ABNORMAL HIGH (ref 9–20)
BUN: 20 mg/dL (ref 6–24)
Bilirubin Total: 0.2 mg/dL (ref 0.0–1.2)
Calcium: 9.7 mg/dL (ref 8.7–10.2)
Chloride: 99 mmol/L (ref 96–106)
Creatinine, Ser: 0.74 mg/dL — ABNORMAL LOW (ref 0.76–1.27)
GFR calc Af Amer: 120 mL/min/{1.73_m2} (ref >59)
GFR calc non Af Amer: 104 mL/min/{1.73_m2} (ref >59)
Globulin, Total: 2.2 g/dL (ref 1.5–4.5)
Glucose: 92 mg/dL (ref 65–99)
Potassium: 4.5 mmol/L (ref 3.5–5.2)
Sodium: 140 mmol/L (ref 134–144)
Total Protein: 6.7 g/dL (ref 6.0–8.5)

## 2016-12-16 LAB — 25-HYDROXY VITAMIN D LCMS D2+D3
25-Hydroxy, Vitamin D-2: <1 ng/mL
25-Hydroxy, Vitamin D-3: 43 ng/mL
25-Hydroxy, Vitamin D: 44 ng/mL

## 2016-12-16 LAB — COMPLIANCE DRUG ANALYSIS, UR

## 2016-12-16 LAB — MAGNESIUM: Magnesium: 1.9 mg/dL (ref 1.6–2.3)

## 2016-12-16 LAB — SEDIMENTATION RATE: Sed Rate: 10 mm/hr (ref 0–30)

## 2016-12-16 LAB — VITAMIN B12: Vitamin B-12: 632 pg/mL (ref 232–1245)

## 2016-12-16 LAB — C-REACTIVE PROTEIN: CRP: 2.5 mg/L (ref 0.0–4.9)

## 2017-01-08 NOTE — Progress Notes (Addendum)
Patient's Name: Lance Roman  MRN: [id number]  Referring Provider: Hoyt Koch, *  DOB: 11-19-1960  PCP: Hoyt Koch, MD  DOS: 01/09/2017  Note by: Gaspar Cola, MD  Service setting: Ambulatory outpatient  Specialty: Interventional Pain Management  Location: [facility] (AMB) Pain Management Facility    Patient type: Established   Primary Reason(s) for Visit: Encounter for evaluation before starting new chronic pain management plan of care (Level of risk: moderate) CC: Back Pain (lower)  HPI  Lance Roman is a 56 y.o. year old, male patient, who comes today for a follow-up evaluation to review the test results and decide on a treatment plan. He has GERD; Chronic low back pain (Primary Area of Pain) (Bilateral) (L>R); Pharmacologic therapy; Infected sebaceous cyst; Disorder of skeletal system; Problems influencing health status; Opiate use (150 MME/day); Long term prescription opiate use; Chronic pain syndrome; Muscle cramps; Long-term current use of opiate analgesic; Chronic lower extremity pain (Secondary Area of Pain) (Left); Chronic lumbar radicular pain (L5) (Left); Retrolisthesis (4-5 mm) (L3 over L4) (L2 over L3; Failed back surgical syndrome (Fusion L4-S1); Lumbar facet hypertrophy (Bilateral); Lumbar facet syndrome (Bilateral) (L>R); Spinal cord stimulator dysfunction (fractured leads); Lumbar facet osteoarthritis (Bilateral); and Osteoarthritis of lumbar spine on his problem list. His primarily concern today is the Back Pain (lower)  Pain Assessment: Location:   Back Radiating: entire left leg Onset: More than a month ago Duration: Chronic pain Quality: Sharp Severity: 6 /10 (self-reported pain score)  Note: Reported level is inconsistent with clinical observations. Clinically the patient looks like a 2/10 Information on the proper use of the pain scale provided to the patient today. When using our objective Pain Scale, levels between 6 and 10/10 are said to belong in  an emergency room, as it progressively worsens from a 6/10, described as severely limiting, requiring emergency care not usually available at an outpatient pain management facility. At a 6/10 level, communication becomes difficult and requires great effort. Assistance to reach the emergency department may be required. Facial flushing and profuse sweating along with potentially dangerous increases in heart rate and blood pressure will be evident. Timing: Intermittent Modifying factors: exercise, stretching, medications  Lance Roman comes in today for a follow-up visit after his initial evaluation on 12/13/2016. Today we went over the results of his tests. These were explained in "Layman's terms". During today's appointment we went over my diagnostic impression, as well as the proposed treatment plan.  According to the patient his primary pain is that of the lower back with the left side being worse than the right. He indicates having had one back surgery by Dr. Deri Fuelling, around October 2000. At the time his symptoms included low back pain and left lower extremity pain all the way down to his heel but at the time it was not getting into his foot. He indicates that the surgery did not help his pain at all. He admits to having participated in physical therapy on a daily basis for approximately 2 weeks after the surgery and this type of exercising seems to have helped. He denies any recent x-rays or MRIs that he indicates having had nerve blocks done before and after the surgery. He mentioned having had some blocks done by Dr. Wendee Copp (Pain Physician in [place], [place]). In addition, the patient indicated that Dr. Wendee Copp did his spinal cord stimulator implant around the year 2000 and approximately 6 months later he had to revise it due to fractured  leads. The patient indicates that the spinal cord stimulator has not worked since the year 2004. The patient also added that even when he was  given him stimulation he would go down the leg without any benefit to his back.  His secondary area of pain is that of the left lower extremity with the pain going down to the heel. This pain starts in the back of the leg and travels to the lateral portion of the as it goes down following what seems to be a dermatomal distribution. He does indicate that the pain occasionally will go to the top of the foot and the area between the big toe and the second toe in what seems to be an L5 dermatomal distribution. He denies any lower extremity surgery, nerve blocks, recent x-rays, physical therapy, but does indicate having had some nerve conduction test at [facility] neurological before the surgery.  Currently the patient describes treating his pain using Celebrex, extended release morphine 30 mg 3 times a day, immediate release morphine 15 mg 4 times a day, and he indicates that he has been on this particular regimen for approximately 4 months. However, the patient also indicates that he has been on opioids for over 20 years, starting before his low back surgery. He also indicates that he has taken those opioids continuously and has never had a "drug holiday" in the past 20 years.  The patient describes having being sent here due to the fact that [facility] closed their doors in [place] and he was referred here. He also indicates having a nonfunctional lumbar spinal cord stimulator that was initially placed by Dr. [name]. According to the patient the first one did not provide him with any significant relief of the pain and this was subsequently replaced by a second one that at this point is completely nonfunctional and he also indicates that he cannot have an MRI due to the implant. In addition to have pain in a patient no [employer], he also describes to have gone to a another pain clinic in [facility]. He describes having had multiple procedures including an idea that by Dr. Darryl Nestle which apparently helped for  a short period of time. He also having indicates having had several nerve blocks that seemed to help for a couple weeks but the pain always returned. One is his biggest problems is muscle spasms and muscle pain.  In considering the treatment plan options, Lance Roman was reminded that I no longer take patients for medication management only. I asked him to let me know if he had no intention of taking advantage of the interventional therapies, so that we could make arrangements to provide this space to someone interested. I also made it clear that undergoing interventional therapies for the purpose of getting pain medications is very inappropriate on the part of a patient, and it will not be tolerated in this practice. This type of behavior would suggest true addiction and therefore it requires referral to an addiction specialist.   Further details on both, my assessment(s), as well as the proposed treatment plan, please see below. Today the patient was clearly informed that should he decide to join the practice, the goal is going to be to decrease his morphine use by slowly tapering the opioids down until we can do a "Drug Holiday".  Topics covered today: the appropriate use of the pain scale, opioid tolerance, "Drug Holidays", Lance Roman primary cause of pain, the results of his recent test(s), the significance of  each one oth the test(s) anomalies and it's corresponding characteristic pain pattern(s), the treatment plan, treatment alternatives, the risks and possible complications of proposed treatment, medication side effects, the opioid analgesic risks and possible complications, the medication agreement and the need to collect and read the AVS material.  Controlled Substance Pharmacotherapy Assessment REMS (Risk Evaluation and Mitigation Strategy)  Analgesic: Morphine ER 30 mg every 8 hours + morphine IR 15 mg every 6 hours (150 MME/day)   Highest recorded MME/day: 315 mg/day MME/day: 150  mg/day Pill Count: None expected due to no prior prescriptions written by our practice. Landis Martins, RN  01/09/2017  9:24 AM  Sign at close encounter Nursing Pain Medication Assessment:  Safety precautions to be maintained throughout the outpatient stay will include: orient to surroundings, keep bed in low position, maintain call bell within reach at all times, provide assistance with transfer out of bed and ambulation.  Medication Inspection Compliance: Pill count conducted under aseptic conditions, in front of the patient. Neither the pills nor the bottle was removed from the patient's sight at any time. Once count was completed pills were immediately returned to the patient in their original bottle.  Medication #1: Morphine ER (MSContin) Pill/Patch Count: 1 of 90 pills remain Pill/Patch Appearance: Markings consistent with prescribed medication Bottle Appearance: Standard pharmacy container. Clearly labeled. Filled Date:09/13/ 2018 Last Medication intake:  Today  Medication #2: Morphine IR Pill/Patch Count: 2 of 120 pills remain Pill/Patch Appearance: Markings consistent with prescribed medication Bottle Appearance: Standard pharmacy container. Clearly labeled. Filled Date: 09/13 / 2018 Last Medication intake:  Today   Pharmacokinetics: Liberation and absorption (onset of action): WNL Distribution (time to peak effect): WNL Metabolism and excretion (duration of action): WNL         Pharmacodynamics: Desired effects: Analgesia: Lance Roman reports >50% benefit. Functional ability: Patient reports that medication allows him to accomplish basic ADLs Clinically meaningful improvement in function (CMIF): Sustained CMIF goals met Perceived effectiveness: Described as relatively effective, allowing for increase in activities of daily living (ADL) Undesirable effects: Side-effects or Adverse reactions: None reported Monitoring: [place] PMP: Online review of the past 20-monthperiod  previously conducted. Not applicable at this point since we have not taken over the patient's medication management yet. List of all Serum Drug Screening Test(s):  No results found for: AMPHSCRSER, BARBSCRSER, BENZOSCRSER, COCAINSCRSER, PCPSCRSER, THCSCRSER, OPIATESCRSER, [place] PCambriaList of all UDS test(s) done:  Lab Results  Component Value Date   SUMMARY FINAL 12/12/2016   Last UDS on record: Summary  Date Value Ref Range Status  12/12/2016 FINAL  Final    Comment:    ==================================================================== TOXASSURE COMP DRUG ANALYSIS,UR ==================================================================== Test                             Result       Flag       Units Drug Present and Declared for Prescription Verification   Morphine                       >6135        EXPECTED   ng/mg creat   Normorphine                    1126         EXPECTED   ng/mg creat    Potential sources of large amounts of morphine in the absence of    codeine  include administration of morphine or use of heroin.    Normorphine is an expected metabolite of morphine.   Hydromorphone                  447          EXPECTED   ng/mg creat    Hydromorphone may be present as a metabolite of morphine;    concentrations of hydromorphone rarely exceed 5% of the morphine    concentration when this is the source of hydromorphone. Drug Present not Declared for Prescription Verification   Ibuprofen                      PRESENT      UNEXPECTED   Naproxen                       PRESENT      UNEXPECTED ==================================================================== Test                      Result    Flag   Units      Ref Range   Creatinine              163              mg/dL      >=20 ==================================================================== Declared Medications:  The flagging and interpretation on this report are based on the  following declared medications.   Unexpected results may arise from  inaccuracies in the declared medications.  **Note: The testing scope of this panel includes these medications:  Morphine (MSIR)  Morphine (Morphine Sulfate)  **Note: The testing scope of this panel does not include following  reported medications:  Celecoxib  Esomeprazole  Olopatadine ==================================================================== For clinical consultation, please call [phone]. ====================================================================    UDS interpretation: Unexpected findings not considered significantly abnormal.          Medication Assessment Form: Patient introduced to form today Treatment compliance: Treatment may start today if patient agrees with proposed plan. Evaluation of compliance is not applicable at this point Risk Assessment Profile: Aberrant behavior: See initial evaluations. None observed or detected today Comorbid factors increasing risk of overdose: See initial evaluation. No additional risks detected today Medical Psychology Evaluation: Please see scanned results in medical record.     Opioid Risk Tool - 12/12/16 1041      Family History of Substance Abuse   Alcohol Negative   Illegal Drugs Negative   Rx Drugs Negative     Personal History of Substance Abuse   Alcohol Negative   Illegal Drugs Negative   Rx Drugs Negative     Age   Age between 40-45 years  No     History of Preadolescent Sexual Abuse   History of Preadolescent Sexual Abuse Negative or Male     Psychological Disease   Psychological Disease Negative   Depression Negative     Total Score   Opioid Risk Tool Scoring 0   Opioid Risk Interpretation Low Risk     ORT Scoring interpretation table:  Score <3 = Low Risk for SUD  Score between 4-7 = Moderate Risk for SUD  Score >8 = High Risk for Opioid Abuse   Risk Mitigation Strategies:  Patient opioid safety counseling: Completed today. Counseling provided to  patient as per "Patient Counseling Document". Document signed by patient, attesting to counseling and understanding Patient-Prescriber Agreement (PPA): Obtained today.  Controlled substance notification to other providers:  Written and sent today.  Pharmacologic Plan: Today we may be taking over the patient's pharmacological regimen. See below From this point on, Lance Roman medication agreement should be considered active.  Laboratory Chemistry  Inflammation Markers (CRP: Acute Phase) (ESR: Chronic Phase) Lab Results  Component Value Date   CRP 2.5 12/12/2016   ESRSEDRATE 10 12/12/2016                 Renal Function Markers Lab Results  Component Value Date   BUN 20 12/12/2016   CREATININE 0.74 (L) 12/12/2016   GFRAA 120 12/12/2016   GFRNONAA 104 12/12/2016                 Hepatic Function Markers Lab Results  Component Value Date   AST 21 12/12/2016   ALT 12 12/30/2014   ALBUMIN 4.5 12/12/2016   ALKPHOS 66 12/12/2016   HCVAB NEGATIVE 12/30/2014                 Electrolytes Lab Results  Component Value Date   NA 140 12/12/2016   K 4.5 12/12/2016   CL 99 12/12/2016   CALCIUM 9.7 12/12/2016   MG 1.9 12/12/2016                 Neuropathy Markers Lab Results  Component Value Date   XNTZGYFV49 449 12/12/2016                 Bone Pathology Markers Lab Results  Component Value Date   ALKPHOS 66 12/12/2016   25OHVITD1 44 12/12/2016   25OHVITD2 <1.0 12/12/2016   25OHVITD3 43 12/12/2016   CALCIUM 9.7 12/12/2016                 Coagulation Parameters Lab Results  Component Value Date   PLT 167.0 03/03/2011                 Cardiovascular Markers Lab Results  Component Value Date   HGB 13.1 03/03/2011   HCT 38.5 (L) 03/03/2011                 Note: Lab results reviewed.  Recent Diagnostic Imaging Review  Thoracic Imaging: Thoracic DG 2-3 views:  Results for orders placed in visit on 04/25/02  DG Thoracic Spine 1 View   Narrative FINDINGS *  CORRECTED ORDER/REFERRING PHYSICIAN ** DR. Nicole Kindred MELOY  [place] [address]       Adrian [place], [place] CLINICAL DATA:  SPINAL CORD STIMULATOR LEAD PLACEMENT. TWO VIEW, THORACIC SPINE THERE ARE TWO LEADS SEEN DORSALLY LOCATED WITHIN THE SPINAL CANAL.  THESE EXTEND TO THE T9 VERTEBRAL LEVEL.  THE THORACIC VERTEBRAE SHOW MINOR DEGENERATIVE CHANGE BUT OTHERWISE HAVE A NORMAL APPEARANCE. IMPRESSION SPINAL CORD STIMULATOR WITH LEADS PRESENT WITHIN THE SPINAL CANAL DORSALLY AS DISCUSSED ABOVE. OTHERWISE, NORMAL VIEWS OF THE THORACIC SPINE ASIDE FROM MINOR DEGENERATIVE CHANGE.   Lumbosacral Imaging: Lumbar DG 2-3 views:  Results for orders placed in visit on 08/22/00  DG Lumbar Spine 2-3 Views   Narrative FINDINGS CLINICAL DATA:  STATUS-POST L4-5 FUSION, NOW FOR CHECK-UP. LUMBAR SPINE, TWO VIEWS: COMPARISON IS MADE TO June 13, 2000. THE PATIENT IS STATUS-POST POSTERIOR FUSION FROM L-4 THROUGH S-1. THE DISC SPACES REMAIN IN PLACE. THERE IS NORMAL ALIGNMENT. NO HARDWARE COMPLICATING FEATURE IS SEEN. NO CHANGE SINCE THE PRIOR EXAM. IMPRESSION NO SIGNIFICANT CHANGE IN THE POST-OP APPEARANCE OF THE LUMBOSACRAL SPINE.   Lumbar DG Bending views:  Results for orders placed during the hospital encounter of 12/12/16  DG Lumbar Spine Complete W/Bend   Narrative CLINICAL DATA:  56 year old male with chronic lumbar back pain. Prior surgery and spinal stimulator.  EXAM: LUMBAR SPINE - COMPLETE WITH BENDING VIEWS  COMPARISON:  Report of lumbar radiographs 08/22/2000 (no images available).  FINDINGS: Normal lumbar segmentation. Mild retrolisthesis of L3 on L4 measuring 4-5 mm. Lesser retrolisthesis of L2 on L3. Otherwise relatively preserved lumbar lordosis. Previous L4-L5 through L5-S1 fusion and decompression. Solid-appearing arthrodesis. Bilateral transpedicular hardware at those levels appears intact. Mild to moderate associated posterior L3-L4 disc space loss and endplate spurring. Lesser disc  space loss and endplate degeneration elsewhere. Mild to moderate facet hypertrophy at L3-L4, minimal elsewhere.  Posterior approach spinal stimulator device with fractured leads (most notably the right hand side lead near situated between the T12 and L1 spinous processes). The intraspinal component is centered at the T12 level.  SI joints and the sacrum appear to be within normal limits. Visible lower thoracic levels appear intact.  Weight-bearing views in neutral flexion and extension positioning. There is motion at the L3-L4 level, with regressed spondylolisthesis in flexion and exaggerated listhesis in extension. No other abnormal motion identified.  Negative visible bowel gas pattern.  IMPRESSION: 1. Previous L4 through S1 fusion and decompression with solid appearing arthrodesis. Hardware appears intact. 2. 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 3. Spinal stimulator device with fracture of the electrical leads.   Electronically Signed   By: Genevie Ann M.D.   On: 12/12/2016 13:43    Complexity Note: Imaging results reviewed. Results shared with Lance Roman, using Layman's terms. Today I personally and independently reviewed the study images pertinent to Mr. Steedley's problem.       Images reviewed using the PACS system hyperlink. I have personally examined the images and I agree with the reported  findings. I find no additional pain-related pathology to add to the report.            Meds   Current Outpatient Prescriptions:  .  ibuprofen (ADVIL,MOTRIN) 200 MG tablet, Take 200 mg by mouth every 6 (six) hours as needed. 2-3 tablets 2-3 times per day, Disp: , Rfl:  .  Multiple Vitamin (MULTIVITAMIN) capsule, Take 1 capsule by mouth daily., Disp: , Rfl:  .  naproxen (NAPROSYN) 250 MG tablet, Take 250 mg by mouth 3 (three) times daily., Disp: , Rfl:  .  olopatadine  (PATANOL) 0.1 % ophthalmic solution, , Disp: , Rfl:  .  celecoxib (CELEBREX) 200 MG capsule, Take 1 capsule (200 mg total) by mouth 2 (two) times daily., Disp: 60 capsule, Rfl: 0 .  esomeprazole (NEXIUM) 40 MG capsule, Take 1 capsule (40 mg total) by mouth 2 (two) times daily before a meal., Disp: 60 capsule, Rfl: 0 .  morphine (MS CONTIN) 30 MG 12 hr tablet, Take 1 tablet (30 mg total) by mouth every 8 (eight) hours., Disp: 90 tablet, Rfl: 0 .  morphine (MSIR) 15 MG tablet, Take 1 tablet (15 mg total) by mouth every 6 (six) hours as needed for severe pain., Disp: 120 tablet, Rfl: 0  ROS  Constitutional: Denies any fever or chills Gastrointestinal: No reported hemesis, hematochezia, vomiting, or acute GI distress Musculoskeletal: Denies any acute onset joint swelling, redness, loss of ROM, or weakness Neurological: No reported episodes of acute onset apraxia, aphasia, dysarthria, agnosia, amnesia, paralysis, loss of coordination, or loss of consciousness  Allergies  Lance Roman has No Known Allergies.  PFSH  Drug:  Lance Roman  reports that he does not use drugs. Alcohol:  reports that he drinks alcohol. Tobacco:  reports that he has quit smoking. He uses smokeless tobacco. Medical:  has a past medical history of Allergic rhinitis; Chronic pain syndrome; DDD (degenerative disc disease), lumbosacral; GERD (gastroesophageal reflux disease); Hyperlipidemia; Osteoarthritis; and S/P appendectomy. Surgical: Lance Roman  has a past surgical history that includes Appendectomy; repairs of bilateral ankle fractures; Spinal fusion (N/A); and Spinal cord stimulator implant (N/A). Family: family history includes Diabetes in his maternal grandmother; Heart disease in his father.  Constitutional Exam  General appearance: Well nourished, well developed, and well hydrated. In no apparent acute distress Vitals:   01/09/17 0918  BP: 132/80  Pulse: (!) 55  Resp: 16  Temp: 98.3 F (36.8 C)  TempSrc: Oral   SpO2: 100%  Weight: 190 lb (86.2 kg)  Height: 5' 10" (1.778 m)   BMI Assessment: Estimated body mass index is 27.26 kg/m as calculated from the following:   Height as of this encounter: 5' 10" (1.778 m).   Weight as of this encounter: 190 lb (86.2 kg).  BMI interpretation table: BMI level Category Range association with higher incidence of chronic pain  <18 kg/m2 Underweight   18.5-24.9 kg/m2 Ideal body weight   25-29.9 kg/m2 Overweight Increased incidence by 20%  30-34.9 kg/m2 Obese (Class I) Increased incidence by 68%  35-39.9 kg/m2 Severe obesity (Class II) Increased incidence by 136%  >40 kg/m2 Extreme obesity (Class III) Increased incidence by 254%   BMI Readings from Last 4 Encounters:  01/09/17 27.26 kg/m  12/12/16 27.55 kg/m  12/08/16 27.55 kg/m  12/02/16 27.26 kg/m   Wt Readings from Last 4 Encounters:  01/09/17 190 lb (86.2 kg)  12/12/16 192 lb (87.1 kg)  12/08/16 192 lb (87.1 kg)  12/02/16 190 lb (86.2 kg)  Psych/Mental status: Alert, oriented x 3 (person, place, & time)       Eyes: PERLA Respiratory: No evidence of acute respiratory distress  Cervical Spine Area Exam  Skin & Axial Inspection: No masses, redness, edema, swelling, or associated skin lesions Alignment: Symmetrical Functional ROM: Unrestricted ROM      Stability: No instability detected Muscle Tone/Strength: Functionally intact. No obvious neuro-muscular anomalies detected. Sensory (Neurological): Unimpaired Palpation: No palpable anomalies              Upper Extremity (UE) Exam    Side: Right upper extremity  Side: Left upper extremity  Skin & Extremity Inspection: Skin color, temperature, and hair growth are WNL. No peripheral edema or cyanosis. No masses, redness, swelling, asymmetry, or associated skin lesions. No contractures.  Skin & Extremity Inspection: Skin color, temperature, and hair growth are WNL. No peripheral edema or cyanosis. No masses, redness, swelling, asymmetry, or  associated skin lesions. No contractures.  Functional ROM: Unrestricted ROM          Functional ROM: Unrestricted ROM          Muscle Tone/Strength: Functionally intact. No obvious neuro-muscular anomalies detected.  Muscle Tone/Strength: Functionally intact. No obvious neuro-muscular anomalies detected.  Sensory (Neurological): Unimpaired          Sensory (Neurological): Unimpaired          Palpation: No palpable anomalies              Palpation: No palpable anomalies              Specialized Test(s): Deferred         Specialized Test(s): Deferred  Thoracic Spine Area Exam  Skin & Axial Inspection: No masses, redness, or swelling Alignment: Symmetrical Functional ROM: Unrestricted ROM Stability: No instability detected Muscle Tone/Strength: Functionally intact. No obvious neuro-muscular anomalies detected. Sensory (Neurological): Unimpaired Muscle strength & Tone: No palpable anomalies  Lumbar Spine Area Exam  Skin & Axial Inspection: Well healed scar from previous spine surgery detected Alignment: Symmetrical Functional ROM: Decreased ROM      Stability: No instability detected Muscle Tone/Strength: Functionally intact. No obvious neuro-muscular anomalies detected. Sensory (Neurological): Movement-associated pain Palpation: Complains of area being tender to palpation       Provocative Tests: Lumbar Hyperextension and rotation test: Positive bilaterally for facet joint pain. Lumbar Lateral bending test: evaluation deferred today       Patrick's Maneuver: evaluation deferred today                    Gait & Posture Assessment  Ambulation: Unassisted Gait: Relatively normal for age and body habitus Posture: WNL   Lower Extremity Exam    Side: Right lower extremity  Side: Left lower extremity  Skin & Extremity Inspection: Skin color, temperature, and hair growth are WNL. No peripheral edema or cyanosis. No masses, redness, swelling, asymmetry, or associated skin lesions. No  contractures.  Skin & Extremity Inspection: Skin color, temperature, and hair growth are WNL. No peripheral edema or cyanosis. No masses, redness, swelling, asymmetry, or associated skin lesions. No contractures.  Functional ROM: Unrestricted ROM          Functional ROM: Unrestricted ROM          Muscle Tone/Strength: Able to Toe-walk & Heel-walk without problems  Muscle Tone/Strength: Able to Toe-walk & Heel-walk without problems  Sensory (Neurological): Unimpaired  Sensory (Neurological): Unimpaired  Palpation: No palpable anomalies  Palpation: No palpable anomalies   Assessment & Plan  Primary Diagnosis & Pertinent Problem List: The primary encounter diagnosis was Chronic low back pain (Primary Area of Pain) (Bilateral) (L>R). Diagnoses of Lumbar facet syndrome (Bilateral) (L>R), Lumbar facet osteoarthritis (Bilateral), Lumbar facet hypertrophy (Bilateral), Retrolisthesis (4-5 mm) (L3 over L4) (L2 over L3, Osteoarthritis of lumbar spine, Spinal cord stimulator dysfunction (fractured leads), Failed back surgical syndrome (Fusion L4-S1), Chronic lower extremity pain (Secondary Area of Pain) (Left), Chronic lumbar radicular pain (L5) (Left), Chronic pain syndrome, Long term prescription opiate use, Opiate use (150 MME/day), and Gastroesophageal reflux disease without esophagitis were also pertinent to this visit.  Visit Diagnosis: 1. Chronic low back pain (Primary Area of Pain) (Bilateral) (L>R)   2. Lumbar facet syndrome (Bilateral) (L>R)   3. Lumbar facet osteoarthritis (Bilateral)   4. Lumbar facet hypertrophy (Bilateral)   5. Retrolisthesis (4-5 mm) (L3 over L4) (L2 over L3   6. Osteoarthritis of lumbar spine   7. Spinal cord stimulator dysfunction (fractured leads)   8. Failed back surgical syndrome (Fusion L4-S1)   9. Chronic lower extremity pain (Secondary Area of Pain) (Left)   10. Chronic lumbar radicular pain (L5) (Left)   11. Chronic pain syndrome   12. Long term prescription opiate  use   13. Opiate use (150 MME/day)   14. Gastroesophageal reflux disease without esophagitis    Problems updated and reviewed during this visit: Problem  Retrolisthesis (4-5 mm) (L3 over L4) (L2 over L3  Failed back surgical syndrome (Fusion L4-S1)  Lumbar facet hypertrophy (Bilateral)  Lumbar facet syndrome (Bilateral) (L>R)  Spinal cord stimulator dysfunction (fractured leads)   Posterior approach spinal stimulator device with fractured leads (most notably the right  hand side lead near situated between the T12 and L1 spinous processes). The intraspinal component is centered at the T12 level. Not working since 2004. Surgery done twice (Oct 2001, due to broken leads, about 6 months after initial placement.) (Implant by Orland Penman, MD - both surgeries)   Lumbar facet osteoarthritis (Bilateral)  Osteoarthritis of lumbar spine   Time Note: Greater than 50% of the 40 minute(s) of face-to-face time spent with Lance Roman, was spent in counseling/coordination of care regarding: the appropriate use of the pain scale, opioid tolerance, "Drug Holidays", Lance Roman primary cause of pain, the results of his recent test(s), the significance of each one oth the test(s) anomalies and it's corresponding characteristic pain pattern(s), the treatment plan, treatment alternatives, the risks and possible complications of proposed treatment, medication side effects, the opioid analgesic risks and possible complications, the medication agreement and the need to collect and read the AVS material.  Plan of Care  Pharmacotherapy (Medications Ordered): Meds ordered this encounter  Medications  . morphine (MSIR) 15 MG tablet    Sig: Take 1 tablet (15 mg total) by mouth every 6 (six) hours as needed for severe pain.    Dispense:  120 tablet    Refill:  0    Fill one day early if pharmacy is closed on scheduled refill date. Do not fill until: 01/09/17 To last until: 02/08/17  . morphine (MS CONTIN) 30 MG 12 hr  tablet    Sig: Take 1 tablet (30 mg total) by mouth every 8 (eight) hours.    Dispense:  90 tablet    Refill:  0    Fill one day early if pharmacy is closed on scheduled refill date. Do not fill until: 01/09/17 To last until: 02/08/17  . celecoxib (CELEBREX) 200 MG capsule    Sig: Take 1 capsule (200 mg total) by mouth 2 (two) times daily.    Dispense:  60 capsule    Refill:  0    Do not place medication on "Automatic Refill". Fill one day early if pharmacy is closed on scheduled refill date.  . esomeprazole (NEXIUM) 40 MG capsule    Sig: Take 1 capsule (40 mg total) by mouth 2 (two) times daily before a meal.    Dispense:  60 capsule    Refill:  0    Do not place medication on "Automatic Refill". Fill one day early if pharmacy is closed on scheduled refill date.    Procedure Orders     LUMBAR FACET(MEDIAL BRANCH NERVE BLOCK) [facility] Lab Orders  No laboratory test(s) ordered today    Imaging Orders     CT LUMBAR SPINE WO CONTRAST  Referral Orders     Ambulatory referral to Neurosurgery: According to the flexion and extension x-rays of the lumbar spine, the patient apparently has some instability at the L3-4 level where he percent with a 4-5 mm retrolisthesis. In addition, the x-rays reveal that his spinal cord stimulator leads are fractured and the patient describes that he has had no benefit from the spinal cord stimulator since it quit working for him around 2004. This stimulator device was implanted by Dr. Wendee Copp (Pain Physician in [place], [place]). Apparently the initial implant was around the year 2000 and approximate 6 months later it had to be replaced due to lead fractures. At this point the patient would like the device removed since it is not MRI compatible and therefore he is not able to have an MRI to further detect  what, problems sees having in the lumbar spine. With this referral, were asking the neurosurgeons to evaluate and need for treatment at the L3-4,  perhaps with extension of the fusion, as well as removal of the fractured leads and spinal cord stimulator battery.  Pharmacological management options:  Opioid Analgesics: We'll take over management today. See above orders Membrane stabilizer: The patient indicates that in the past he had a gabapentin trial where it "messed his mind". The patient was unable to tell me the dose at which he was started but he indicates that he was started with taking the medicine during the day. This particular schedule caused him to be unable to work and therefore he stopped it. Muscle relaxant: We have discussed the possibility of a trial NSAID: We have discussed the possibility of a trial Other analgesic(s): To be determined at a later time   Interventional management options: Planned, scheduled, and/or pending:    Arrange for Lumbar CT due to unstable L3-4 retrolisthesis with symptoms. Unable to do MRI secondary to spinal cord stimulator. In addition he needs a Neurosurgery referral to [facility] for evaluation of instability and possible removal of SCS and fractured leads. Have him sign the medication agreement and provide copy to him. Schedule him for diagnostic bilateral lumbar facet block under fluoro and sedation.   Considering:   Removal of nonfunctional spinal cord stimulator followed by an MRI of the lumbar spine with and without contrast. Diagnostic bilateral lumbar facet block  Possible bilateral lumbar facet RFA Diagnostic caudal epidural steroid injection plus diagnostic epidurogram  Possible Racz procedure    PRN Procedures:   None at this time   Provider-requested follow-up: Return for Procedure (w/ sedation): (B) L-FCT Blk.  Future Appointments Date Time Provider [place]  01/10/2017 1:00 PM Ursula Alert, MD [facility] None    Primary Care Physician: Hoyt Koch, MD Location: [facility] Note by: Gaspar Cola, MD Date: 01/09/2017;  Time: 1:21 PM

## 2017-01-09 ENCOUNTER — Ambulatory Visit: Payer: Worker's Compensation | Attending: Pain Medicine | Admitting: Pain Medicine

## 2017-01-09 ENCOUNTER — Encounter: Payer: Self-pay | Admitting: Pain Medicine

## 2017-01-09 VITALS — BP 132/80 | HR 55 | Temp 98.3°F | Resp 16 | Ht 70.0 in | Wt 190.0 lb

## 2017-01-09 DIAGNOSIS — M47816 Spondylosis without myelopathy or radiculopathy, lumbar region: Secondary | ICD-10-CM | POA: Diagnosis not present

## 2017-01-09 DIAGNOSIS — M4310 Spondylolisthesis, site unspecified: Secondary | ICD-10-CM | POA: Diagnosis not present

## 2017-01-09 DIAGNOSIS — M5410 Radiculopathy, site unspecified: Secondary | ICD-10-CM | POA: Diagnosis not present

## 2017-01-09 DIAGNOSIS — Z87891 Personal history of nicotine dependence: Secondary | ICD-10-CM | POA: Insufficient documentation

## 2017-01-09 DIAGNOSIS — M47896 Other spondylosis, lumbar region: Secondary | ICD-10-CM

## 2017-01-09 DIAGNOSIS — F329 Major depressive disorder, single episode, unspecified: Secondary | ICD-10-CM | POA: Diagnosis not present

## 2017-01-09 DIAGNOSIS — M5117 Intervertebral disc disorders with radiculopathy, lumbosacral region: Secondary | ICD-10-CM | POA: Insufficient documentation

## 2017-01-09 DIAGNOSIS — M79605 Pain in left leg: Secondary | ICD-10-CM | POA: Diagnosis not present

## 2017-01-09 DIAGNOSIS — G8929 Other chronic pain: Secondary | ICD-10-CM | POA: Diagnosis present

## 2017-01-09 DIAGNOSIS — M4726 Other spondylosis with radiculopathy, lumbar region: Secondary | ICD-10-CM | POA: Insufficient documentation

## 2017-01-09 DIAGNOSIS — Z79891 Long term (current) use of opiate analgesic: Secondary | ICD-10-CM | POA: Diagnosis not present

## 2017-01-09 DIAGNOSIS — K219 Gastro-esophageal reflux disease without esophagitis: Secondary | ICD-10-CM | POA: Diagnosis not present

## 2017-01-09 DIAGNOSIS — M1990 Unspecified osteoarthritis, unspecified site: Secondary | ICD-10-CM | POA: Diagnosis not present

## 2017-01-09 DIAGNOSIS — G894 Chronic pain syndrome: Secondary | ICD-10-CM | POA: Insufficient documentation

## 2017-01-09 DIAGNOSIS — M5442 Lumbago with sciatica, left side: Secondary | ICD-10-CM | POA: Diagnosis not present

## 2017-01-09 DIAGNOSIS — Z981 Arthrodesis status: Secondary | ICD-10-CM | POA: Insufficient documentation

## 2017-01-09 DIAGNOSIS — E785 Hyperlipidemia, unspecified: Secondary | ICD-10-CM | POA: Insufficient documentation

## 2017-01-09 DIAGNOSIS — M545 Low back pain: Secondary | ICD-10-CM | POA: Insufficient documentation

## 2017-01-09 DIAGNOSIS — F1190 Opioid use, unspecified, uncomplicated: Secondary | ICD-10-CM

## 2017-01-09 DIAGNOSIS — M961 Postlaminectomy syndrome, not elsewhere classified: Secondary | ICD-10-CM | POA: Diagnosis not present

## 2017-01-09 DIAGNOSIS — Z9889 Other specified postprocedural states: Secondary | ICD-10-CM | POA: Diagnosis not present

## 2017-01-09 DIAGNOSIS — T85192S Other mechanical complication of implanted electronic neurostimulator (electrode) of spinal cord, sequela: Secondary | ICD-10-CM | POA: Diagnosis not present

## 2017-01-09 DIAGNOSIS — M4316 Spondylolisthesis, lumbar region: Secondary | ICD-10-CM | POA: Diagnosis not present

## 2017-01-09 MED ORDER — ESOMEPRAZOLE MAGNESIUM 40 MG PO CPDR: 40.0000 mg | DELAYED_RELEASE_CAPSULE | Freq: Two times a day (BID) | ORAL | 0 refills | Status: DC

## 2017-01-09 MED ORDER — CELECOXIB 200 MG PO CAPS: 200.0000 mg | ORAL_CAPSULE | Freq: Two times a day (BID) | ORAL | 0 refills | Status: DC

## 2017-01-09 MED ORDER — MORPHINE SULFATE 15 MG PO TABS: 15.0000 mg | ORAL_TABLET | Freq: Four times a day (QID) | ORAL | 0 refills | Status: DC | PRN

## 2017-01-09 MED ORDER — MORPHINE SULFATE ER 30 MG PO TBCR: 30.0000 mg | EXTENDED_RELEASE_TABLET | Freq: Three times a day (TID) | ORAL | 0 refills | Status: DC

## 2017-01-09 NOTE — Patient Instructions (Addendum)
____________________________________________________________________________________________  Pain Scale  Introduction: The pain score used by this practice is the Verbal Numerical Rating Scale (VNRS-11). This is an 11-point scale. It is for adults and children 10 years or older. There are significant differences in how the pain score is reported, used, and applied. Forget everything you learned in the past and learn this scoring system.  General Information: The scale should reflect your current level of pain. Unless you are specifically asked for the level of your worst pain, or your average pain. If you are asked for one of these two, then it should be understood that it is over the past 24 hours.  Basic Activities of Daily Living (ADL): Personal hygiene, dressing, eating, transferring, and using restroom.  Instructions: Most patients tend to report their level of pain as a combination of two factors, their physical pain and their psychosocial pain. This last one is also known as "suffering" and it is reflection of how physical pain affects you socially and psychologically. From now on, report them separately. From this point on, when asked to report your pain level, report only your physical pain. Use the following table for reference.  Pain Clinic Pain Levels (0-5/10)  Pain Level Score  Description  No Pain 0   Mild pain 1 Nagging, annoying, but does not interfere with basic activities of daily living (ADL). Patients are able to eat, bathe, get dressed, toileting (being able to get on and off the toilet and perform personal hygiene functions), transfer (move in and out of bed or a chair without assistance), and maintain continence (able to control bladder and bowel functions). Blood pressure and heart rate are unaffected. A normal heart rate for a healthy adult ranges from 60 to 100 bpm (beats per minute).   Mild to moderate pain 2 Noticeable and distracting. Impossible to hide from other  people. More frequent flare-ups. Still possible to adapt and function close to normal. It can be very annoying and may have occasional stronger flare-ups. With discipline, patients may get used to it and adapt.   Moderate pain 3 Interferes significantly with activities of daily living (ADL). It becomes difficult to feed, bathe, get dressed, get on and off the toilet or to perform personal hygiene functions. Difficult to get in and out of bed or a chair without assistance. Very distracting. With effort, it can be ignored when deeply involved in activities.   Moderately severe pain 4 Impossible to ignore for more than a few minutes. With effort, patients may still be able to manage work or participate in some social activities. Very difficult to concentrate. Signs of autonomic nervous system discharge are evident: dilated pupils (mydriasis); mild sweating (diaphoresis); sleep interference. Heart rate becomes elevated (>115 bpm). Diastolic blood pressure (lower number) rises above 100 mmHg. Patients find relief in laying down and not moving.   Severe pain 5 Intense and extremely unpleasant. Associated with frowning face and frequent crying. Pain overwhelms the senses.  Ability to do any activity or maintain social relationships becomes significantly limited. Conversation becomes difficult. Pacing back and forth is common, as getting into a comfortable position is nearly impossible. Pain wakes you up from deep sleep. Physical signs will be obvious: pupillary dilation; increased sweating; goosebumps; brisk reflexes; cold, clammy hands and feet; nausea, vomiting or dry heaves; loss of appetite; significant sleep disturbance with inability to fall asleep or to remain asleep. When persistent, significant weight loss is observed due to the complete loss of appetite and sleep deprivation.  Blood   pressure and heart rate becomes significantly elevated. Caution: If elevated blood pressure triggers a pounding headache  associated with blurred vision, then the patient should immediately seek attention at an urgent or emergency care unit, as these may be signs of an impending stroke.    Emergency Department Pain Levels (6-10/10)  Emergency Room Pain 6 Severely limiting. Requires emergency care and should not be seen or managed at an outpatient pain management facility. Communication becomes difficult and requires great effort. Assistance to reach the emergency department may be required. Facial flushing and profuse sweating along with potentially dangerous increases in heart rate and blood pressure will be evident.   Distressing pain 7 Self-care is very difficult. Assistance is required to transport, or use restroom. Assistance to reach the emergency department will be required. Tasks requiring coordination, such as bathing and getting dressed become very difficult.   Disabling pain 8 Self-care is no longer possible. At this level, pain is disabling. The individual is unable to do even the most "basic" activities such as walking, eating, bathing, dressing, transferring to a bed, or toileting. Fine motor skills are lost. It is difficult to think clearly.   Incapacitating pain 9 Pain becomes incapacitating. Thought processing is no longer possible. Difficult to remember your own name. Control of movement and coordination are lost.   The worst pain imaginable 10 At this level, most patients pass out from pain. When this level is reached, collapse of the autonomic nervous system occurs, leading to a sudden drop in blood pressure and heart rate. This in turn results in a temporary and dramatic drop in blood flow to the brain, leading to a loss of consciousness. Fainting is one of the body's self defense mechanisms. Passing out puts the brain in a calmed state and causes it to shut down for a while, in order to begin the healing process.    Summary: 1. Refer to this scale when providing us with your pain level. 2. Be  accurate and careful when reporting your pain level. This will help with your care. 3. Over-reporting your pain level will lead to loss of credibility. 4. Even a level of 1/10 means that there is pain and will be treated at our facility. 5. High, inaccurate reporting will be documented as "Symptom Exaggeration", leading to loss of credibility and suspicions of possible secondary gains such as obtaining more narcotics, or wanting to appear disabled, for fraudulent reasons. 6. Only pain levels of 5 or below will be seen at our facility. 7. Pain levels of 6 and above will be sent to the Emergency Department and the appointment cancelled. ____________________________________________________________________________________________  ____________________________________________________________________________________________  Preparing for Procedure with Sedation Instructions: . Oral Intake: Do not eat or drink anything for at least 8 hours prior to your procedure. . Transportation: Public transportation is not allowed. Bring an adult driver. The driver must be physically present in our waiting room before any procedure can be started. . Physical Assistance: Bring an adult physically capable of assisting you, in the event you need help. This adult should keep you company at home for at least 6 hours after the procedure. . Blood Pressure Medicine: Take your blood pressure medicine with a sip of water the morning of the procedure. . Blood thinners:  . Diabetics on insulin: Notify the staff so that you can be scheduled 1st case in the morning. If your diabetes requires high dose insulin, take only  of your normal insulin dose the morning of the procedure and notify the   staff that you have done so. . Preventing infections: Shower with an antibacterial soap the morning of your procedure. . Build-up your immune system: Take 1000 mg of Vitamin C with every meal (3 times a day) the day prior to your  procedure. Marland Kitchen Antibiotics: Inform the staff if you have a condition or reason that requires you to take antibiotics before dental procedures. . Pregnancy: If you are pregnant, call and cancel the procedure. . Sickness: If you have a cold, fever, or any active infections, call and cancel the procedure. . Arrival: You must be in the facility at least 30 minutes prior to your scheduled procedure. . Children: Do not bring children with you. . Dress appropriately: Bring dark clothing that you would not mind if they get stained. . Valuables: Do not bring any jewelry or valuables. Procedure appointments are reserved for interventional treatments only. Marland Kitchen No Prescription Refills. . No medication changes will be discussed during procedure appointments. . No disability issues will be discussed. ____________________________________________________________________________________________   ____________________________________________________________________________________________  Medication Rules  Applies to: All patients receiving prescriptions (written or electronic).  Pharmacy of record: Pharmacy where electronic prescriptions will be sent. If written prescriptions are taken to a different pharmacy, please inform the nursing staff. The pharmacy listed in the electronic medical record should be the one where you would like electronic prescriptions to be sent.  Prescription refills: Only during scheduled appointments. Applies to both, written and electronic prescriptions.  NOTE: The following applies primarily to controlled substances (Opioid* Pain Medications).   Patient's responsibilities: 1. Pain Pills: Bring all pain pills to every appointment (except for procedure appointments). 2. Pill Bottles: Bring pills in original pharmacy bottle. Always bring newest bottle. Bring bottle, even if empty. 3. Medication refills: You are responsible for knowing and keeping track of what medications you need  refilled. The day before your appointment, write a list of all prescriptions that need to be refilled. Bring that list to your appointment and give it to the admitting nurse. Prescriptions will be written only during appointments. If you forget a medication, it will not be "Called in", "Faxed", or "electronically sent". You will need to get another appointment to get these prescribed. 4. Prescription Accuracy: You are responsible for carefully inspecting your prescriptions before leaving our office. Have the discharge nurse carefully go over each prescription with you, before taking them home. Make sure that your name is accurately spelled, that your address is correct. Check the name and dose of your medication to make sure it is accurate. Check the number of pills, and the written instructions to make sure they are clear and accurate. Make sure that you are given enough medication to last until your next medication refill appointment. 5. Taking Medication: Take medication as prescribed. Never take more pills than instructed. Never take medication more frequently than prescribed. Taking less pills or less frequently is permitted and encouraged, when it comes to controlled substances (written prescriptions).  6. Inform other Doctors: Always inform, all of your healthcare providers, of all the medications you take. 7. Pain Medication from other Providers: You are not allowed to accept any additional pain medication from any other Doctor or Healthcare provider. There are two exceptions to this rule. (see below) In the event that you require additional pain medication, you are responsible for notifying us, as stated below. 8. Medication Agreement: You are responsible for carefully reading and following our Medication Agreement. This must be signed before receiving any prescriptions from our practice. Safely store a  copy of your signed Agreement. Violations to the Agreement will result in no further prescriptions.  (Additional copies of our Medication Agreement are available upon request.) 9. Laws, Rules, & Regulations: All patients are expected to follow all [address] and [employer], Rules,  [place]. Ignorance of the Laws does not constitute a valid excuse. The use of any illegal substances is prohibited. 10. Adopted CDC guidelines & recommendations: Target dosing levels will be at or below 60 MME/day. Use of benzodiazepines** is not recommended.  Exceptions: There are only two exceptions to the rule of not receiving pain medications from other Healthcare Providers. 1. Exception #1 (Emergencies): In the event of an emergency (i.e.: accident requiring emergency care), you are allowed to receive additional pain medication. However, you are responsible for: As soon as you are able, call our office [phone], at any time of the day or night, and leave a message stating your name, the date and nature of the emergency, and the name and dose of the medication prescribed. In the event that your call is answered by a member of our staff, make sure to document and save the date, time, and the name of the person that took your information.  2. Exception #2 (Planned Surgery): In the event that you are scheduled by another doctor or dentist to have any type of surgery or procedure, you are allowed (for a period no longer than 30 days), to receive additional pain medication, for the acute post-op pain. However, in this case, you are responsible for picking up a copy of our "Post-op Pain Management for Surgeons" handout, and giving it to your surgeon or dentist. This document is available at our office, and does not require an appointment to obtain it. Simply go to our office during business hours (Monday-Thursday from 8:00 AM to 4:00 PM) (Friday 8:00 AM to 12:00 Noon) or if you have a scheduled appointment with [place], prior to your surgery, and ask for it by name. In addition, you will need to provide [place] with your  name, name of your surgeon, type of surgery, and date of procedure or surgery.  *Opioid medications include: morphine, codeine, oxycodone, oxymorphone, hydrocodone, hydromorphone, meperidine, tramadol, tapentadol, buprenorphine, fentanyl, methadone. **Benzodiazepine medications include: diazepam (Valium), alprazolam (Xanax), clonazepam (Klonopine), lorazepam (Ativan), clorazepate (Tranxene), chlordiazepoxide (Librium), estazolam (Prosom), oxazepam (Serax), temazepam (Restoril), triazolam (Halcion)  ____________________________________________________________________________________________ GENERAL RISKS AND COMPLICATIONS  What are the risk, side effects and possible complications? Generally speaking, most procedures are safe.  However, with any procedure there are risks, side effects, and the possibility of complications.  The risks and complications are dependent upon the sites that are lesioned, or the type of nerve block to be performed.  The closer the procedure is to the spine, the more serious the risks are.  Great care is taken when placing the radio frequency needles, block needles or lesioning probes, but sometimes complications can occur. 1. Infection: Any time there is an injection through the skin, there is a risk of infection.  This is why sterile conditions are used for these blocks.  There are four possible types of infection. 1. Localized skin infection. 2. Central Nervous System Infection-This can be in the form of Meningitis, which can be deadly. 3. Epidural Infections-This can be in the form of an epidural abscess, which can cause pressure inside of the spine, causing compression of the spinal cord with subsequent paralysis. This would require an emergency surgery to decompress, and there are no guarantees that the patient would  recover from the paralysis. 4. Discitis-This is an infection of the intervertebral discs.  It occurs in about 1% of discography procedures.  It is difficult  to treat and it may lead to surgery.        2. Pain: the needles have to go through skin and soft tissues, will cause soreness.       3. Damage to internal structures:  The nerves to be lesioned may be near blood vessels or    other nerves which can be potentially damaged.       4. Bleeding: Bleeding is more common if the patient is taking blood thinners such as  aspirin, Coumadin, Ticiid, Plavix, etc., or if he/she have some genetic predisposition  such as hemophilia. Bleeding into the spinal canal can cause compression of the spinal  cord with subsequent paralysis.  This would require an emergency surgery to  decompress and there are no guarantees that the patient would recover from the  paralysis.       5. Pneumothorax:  Puncturing of a lung is a possibility, every time a needle is introduced in  the area of the chest or upper back.  Pneumothorax refers to free air around the  collapsed lung(s), inside of the thoracic cavity (chest cavity).  Another two possible  complications related to a similar event would include: Hemothorax and Chylothorax.   These are variations of the Pneumothorax, where instead of air around the collapsed  lung(s), you may have blood or chyle, respectively.       6. Spinal headaches: They may occur with any procedures in the area of the spine.       7. Persistent CSF (Cerebro-Spinal Fluid) leakage: This is a rare problem, but may occur  with prolonged intrathecal or epidural catheters either due to the formation of a fistulous  track or a dural tear.       8. Nerve damage: By working so close to the spinal cord, there is always a possibility of  nerve damage, which could be as serious as a permanent spinal cord injury with  paralysis.       9. Death:  Although rare, severe deadly allergic reactions known as "Anaphylactic  reaction" can occur to any of the medications used.      10. Worsening of the symptoms:  We can always make thing worse.  What are the chances of something  like this happening? Chances of any of this occuring are extremely low.  By statistics, you have more of a chance of getting killed in a motor vehicle accident: while driving to the hospital than any of the above occurring .  Nevertheless, you should be aware that they are possibilities.  In general, it is similar to taking a shower.  Everybody knows that you can slip, hit your head and get killed.  Does that mean that you should not shower again?  Nevertheless always keep in mind that statistics do not mean anything if you happen to be on the wrong side of them.  Even if a procedure has a 1 (one) in a 1,000,000 (million) chance of going wrong, it you happen to be that one..Also, keep in mind that by statistics, you have more of a chance of having something go wrong when taking medications.  Who should not have this procedure? If you are on a blood thinning medication (e.g. Coumadin, Plavix, see list of "Blood Thinners"), or if you have an active infection going on, you should not have  the procedure.  If you are taking any blood thinners, please inform your physician.  How should I prepare for this procedure?  Do not eat or drink anything at least six hours prior to the procedure.  Bring a driver with you .  It cannot be a taxi.  Come accompanied by an adult that can drive you back, and that is strong enough to help you if your legs get weak or numb from the local anesthetic.  Take all of your medicines the morning of the procedure with just enough water to swallow them.  If you have diabetes, make sure that you are scheduled to have your procedure done first thing in the morning, whenever possible.  If you have diabetes, take only half of your insulin dose and notify our nurse that you have done so as soon as you arrive at the clinic.  If you are diabetic, but only take blood sugar pills (oral hypoglycemic), then do not take them on the morning of your procedure.  You may take them after you  have had the procedure.  Do not take aspirin or any aspirin-containing medications, at least eleven (11) days prior to the procedure.  They may prolong bleeding.  Wear loose fitting clothing that may be easy to take off and that you would not mind if it got stained with Betadine or blood.  Do not wear any jewelry or perfume  Remove any nail coloring.  It will interfere with some of our monitoring equipment.  NOTE: Remember that this is not meant to be interpreted as a complete list of all possible complications.  Unforeseen problems may occur.  BLOOD THINNERS The following drugs contain aspirin or other products, which can cause increased bleeding during surgery and should not be taken for 2 weeks prior to and 1 week after surgery.  If you should need take something for relief of minor pain, you may take acetaminophen which is found in Tylenol,m Datril, Anacin-3 and Panadol. It is not blood thinner. The products listed below are.  Do not take any of the products listed below in addition to any listed on your instruction sheet.  A.P.C or A.P.C with Codeine Codeine Phosphate Capsules #3 Ibuprofen Ridaura  ABC compound Congesprin Imuran rimadil  Advil Cope Indocin Robaxisal  Alka-Seltzer Effervescent Pain Reliever and Antacid Coricidin or Coricidin-D  Indomethacin Rufen  Alka-Seltzer plus Cold Medicine Cosprin Ketoprofen S-A-C Tablets  Anacin Analgesic Tablets or Capsules Coumadin Korlgesic Salflex  Anacin Extra Strength Analgesic tablets or capsules CP-2 Tablets Lanoril Salicylate  Anaprox Cuprimine Capsules Levenox Salocol  Anexsia-D Dalteparin Magan Salsalate  Anodynos Darvon compound Magnesium Salicylate Sine-off  Ansaid Dasin Capsules Magsal Sodium Salicylate  Anturane Depen Capsules Marnal Soma  APF Arthritis pain formula Dewitt's Pills Measurin Stanback  Argesic Dia-Gesic Meclofenamic Sulfinpyrazone  Arthritis Bayer Timed Release Aspirin Diclofenac Meclomen Sulindac  Arthritis pain  formula Anacin Dicumarol Medipren Supac  Analgesic (Safety coated) Arthralgen Diffunasal Mefanamic Suprofen  Arthritis Strength Bufferin Dihydrocodeine Mepro Compound Suprol  Arthropan liquid Dopirydamole Methcarbomol with Aspirin Synalgos  ASA tablets/Enseals Disalcid Micrainin Tagament  Ascriptin Doan's Midol Talwin  Ascriptin A/D Dolene Mobidin Tanderil  Ascriptin Extra Strength Dolobid Moblgesic Ticlid  Ascriptin with Codeine Doloprin or Doloprin with Codeine Momentum Tolectin  Asperbuf Duoprin Mono-gesic Trendar  Aspergum Duradyne Motrin or Motrin IB Triminicin  Aspirin plain, buffered or enteric coated Durasal Myochrisine Trigesic  Aspirin Suppositories Easprin Nalfon Trillsate  Aspirin with Codeine Ecotrin Regular or Extra Strength Naprosyn Uracel  Atromid-S Efficin  Naproxen Ursinus  Auranofin Capsules Elmiron Neocylate Vanquish  Axotal Emagrin Norgesic Verin  Azathioprine Empirin or Empirin with Codeine Normiflo Vitamin E  Azolid Emprazil Nuprin Voltaren  Bayer Aspirin plain, buffered or children's or timed BC Tablets or powders Encaprin Orgaran Warfarin Sodium  Buff-a-Comp Enoxaparin Orudis Zorpin  Buff-a-Comp with Codeine Equegesic Os-Cal-Gesic   Buffaprin Excedrin plain, buffered or Extra Strength Oxalid   Bufferin Arthritis Strength Feldene Oxphenbutazone   Bufferin plain or Extra Strength Feldene Capsules Oxycodone with Aspirin   Bufferin with Codeine Fenoprofen Fenoprofen Pabalate or Pabalate-SF   Buffets II Flogesic Panagesic   Buffinol plain or Extra Strength Florinal or Florinal with Codeine Panwarfarin   Buf-Tabs Flurbiprofen Penicillamine   Butalbital Compound Four-way cold tablets Penicillin   Butazolidin Fragmin Pepto-Bismol   Carbenicillin Geminisyn Percodan   Carna Arthritis Reliever Geopen Persantine   Carprofen Gold's salt Persistin   Chloramphenicol Goody's Phenylbutazone   Chloromycetin Haltrain Piroxlcam   Clmetidine heparin Plaquenil   Cllnoril  Hyco-pap Ponstel   Clofibrate Hydroxy chloroquine Propoxyphen         Before stopping any of these medications, be sure to consult the physician who ordered them.  Some, such as Coumadin (Warfarin) are ordered to prevent or treat serious conditions such as "deep thrombosis", "pumonary embolisms", and other heart problems.  The amount of time that you may need off of the medication may also vary with the medication and the reason for which you were taking it.  If you are taking any of these medications, please make sure you notify your pain physician before you undergo any procedures.          Facet Joint Block The facet joints connect the bones of the spine (vertebrae). They make it possible for you to bend, twist, and make other movements with your spine. They also keep you from bending too far, twisting too far, and making other excessive movements. A facet joint block is a procedure where a numbing medicine (anesthetic) is injected into a facet joint. Often, a type of anti-inflammatory medicine called a steroid is also injected. A facet joint block may be done to diagnose neck or back pain. If the pain gets better after a facet joint block, it means the pain is probably coming from the facet joint. If the pain does not get better, it means the pain is probably not coming from the facet joint. A facet joint block may also be done to relieve neck or back pain caused by an inflamed facet joint. A facet joint block is only done to relieve pain if the pain does not improve with other methods, such as medicine, exercise programs, and physical therapy. Tell a health care provider about:  Any allergies you have.  All medicines you are taking, including vitamins, herbs, eye drops, creams, and over-the-counter medicines.  Any problems you or family members have had with anesthetic medicines.  Any blood disorders you have.  Any surgeries you have had.  Any medical conditions you have.  Whether  you are pregnant or may be pregnant. What are the risks? Generally, this is a safe procedure. However, problems may occur, including:  Bleeding.  Injury to a nerve near the injection site.  Pain at the injection site.  Weakness or numbness in areas controlled by nerves near the injection site.  Infection.  Temporary fluid retention.  Allergic reactions to medicines or dyes.  Injury to other structures or organs near the injection site.  What happens before the procedure?  Follow instructions from your health care provider about eating or drinking restrictions.  Ask your health care provider about: ? Changing or stopping your regular medicines. This is especially important if you are taking diabetes medicines or blood thinners. ? Taking medicines such as aspirin and ibuprofen. These medicines can thin your blood. Do not take these medicines before your procedure if your health care provider instructs you not to.  Do not take any new dietary supplements or medicines without asking your health care provider first.  Plan to have someone take you home after the procedure. What happens during the procedure?  You may need to remove your clothing and dress in an open-back gown.  The procedure will be done while you are lying on an X-ray table. You will most likely be asked to lie on your stomach, but you may be asked to lie in a different position if an injection will be made in your neck.  Machines will be used to monitor your oxygen levels, heart rate, and blood pressure.  If an injection will be made in your neck, an IV tube will be inserted into one of your veins. Fluids and medicine will flow directly into your body through the IV tube.  The area over the facet joint where the injection will be made will be cleaned with soap. The surrounding skin will be covered with clean drapes.  A numbing medicine (local anesthetic) will be applied to your skin. Your skin may sting or burn  for a moment.  A video X-ray machine (fluoroscopy) will be used to locate the joint. In some cases, a CT scan may be used.  A contrast dye may be injected into the facet joint area to help locate the joint.  When the joint is located, an anesthetic will be injected into the joint through the needle.  Your health care provider will ask you whether you feel pain relief. If you do feel relief, a steroid may be injected to provide pain relief for a longer period of time. If you do not feel relief or feel only partial relief, additional injections of an anesthetic may be made in other facet joints.  The needle will be removed.  Your skin will be cleaned.  A bandage (dressing) will be applied over each injection site. The procedure may vary among health care providers and hospitals. What happens after the procedure?  You will be observed for 15-30 minutes before being allowed to go home. This information is not intended to replace advice given to you by your health care provider. Make sure you discuss any questions you have with your health care provider. Document Released: 08/03/2006 Document Revised: 04/15/2015 Document Reviewed: 12/08/2014 Elsevier Interactive Patient Education  [employer].

## 2017-01-09 NOTE — Progress Notes (Signed)
Nursing Pain Medication Assessment:  Safety precautions to be maintained throughout the outpatient stay will include: orient to surroundings, keep bed in low position, maintain call bell within reach at all times, provide assistance with transfer out of bed and ambulation.  Medication Inspection Compliance: Pill count conducted under aseptic conditions, in front of the patient. Neither the pills nor the bottle was removed from the patient's sight at any time. Once count was completed pills were immediately returned to the patient in their original bottle.  Medication #1: Morphine ER (MSContin) Pill/Patch Count: 1 of 90 pills remain Pill/Patch Appearance: Markings consistent with prescribed medication Bottle Appearance: Standard pharmacy container. Clearly labeled. Filled Date:09/13/ 2018 Last Medication intake:  Today  Medication #2: Morphine IR Pill/Patch Count: 2 of 120 pills remain Pill/Patch Appearance: Markings consistent with prescribed medication Bottle Appearance: Standard pharmacy container. Clearly labeled. Filled Date: 09/13 / 2018 Last Medication intake:  Today

## 2017-01-10 ENCOUNTER — Encounter: Payer: Self-pay | Admitting: Psychiatry

## 2017-01-10 ENCOUNTER — Ambulatory Visit (INDEPENDENT_AMBULATORY_CARE_PROVIDER_SITE_OTHER): Payer: PRIVATE HEALTH INSURANCE | Admitting: Psychiatry

## 2017-01-10 VITALS — BP 110/70 | HR 54 | Temp 98.3°F | Wt 189.4 lb

## 2017-01-10 DIAGNOSIS — G4701 Insomnia due to medical condition: Secondary | ICD-10-CM

## 2017-01-10 DIAGNOSIS — H40023 Open angle with borderline findings, high risk, bilateral: Secondary | ICD-10-CM | POA: Diagnosis not present

## 2017-01-10 DIAGNOSIS — G8929 Other chronic pain: Secondary | ICD-10-CM

## 2017-01-10 DIAGNOSIS — F1120 Opioid dependence, uncomplicated: Secondary | ICD-10-CM

## 2017-01-10 DIAGNOSIS — H40033 Anatomical narrow angle, bilateral: Secondary | ICD-10-CM | POA: Diagnosis not present

## 2017-01-10 DIAGNOSIS — H3589 Other specified retinal disorders: Secondary | ICD-10-CM | POA: Diagnosis not present

## 2017-01-10 DIAGNOSIS — H18413 Arcus senilis, bilateral: Secondary | ICD-10-CM | POA: Diagnosis not present

## 2017-01-10 NOTE — Progress Notes (Signed)
Psychiatric Initial Adult Assessment   Patient Identification: Lance Roman MRN:  [id number] Date of Evaluation:  01/10/2017 Referral Source: Delano Metz MD Chief Complaint:   Chief Complaint    Establish Care; Other     Visit Diagnosis:    ICD-10-CM   1. Opioid use disorder, severe, in controlled environment, dependence (HCC) F11.20   2. 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  Lance Roman presents as pleasant , cheerful, denies any concerns.  He denies any sadness, crying spells, appetite changes or SI.  He does report some on and off sleep issues , reports it as secondary to pain. He reports that he gets a good solid 6-8 hrs of sleep on good days . He has tried sleep aids , OTC , prescribed ( does not know the names) . He reports he does not want any help with sleep at this time .  He denies any anxiety sx.  He denies mood lability, perceptual disturbances .  He denies hx of trauma.  He denies any current stressors except for his pain issues. He however reports he is active , works in his garden , is employed.  He has support system from his wife and children.    Associated Signs/Symptoms: Depression Symptoms:  insomnia, (Hypo) Manic Symptoms:  denies Anxiety Symptoms:  denies Psychotic Symptoms:  denies PTSD Symptoms: Negative  Past Psychiatric History: Denies past hx of mental illness, denies IP admissions, denies suicide attempts.  Previous Psychotropic Medications: Yes - tried sleep aids like OTC melatonin  Substance Abuse History in the last 12 months:  No.  Consequences of Substance Abuse: Negative  Past Medical History:  Past Medical History:  Diagnosis Date  . Allergic rhinitis   . Chronic pain syndrome   .  DDD (degenerative disc disease), lumbosacral   . GERD (gastroesophageal reflux disease)   . Hyperlipidemia   . Osteoarthritis   . S/P appendectomy     Past Surgical History:  Procedure Laterality Date  . APPENDECTOMY    . repairs of bilateral ankle fractures    . SPINAL CORD STIMULATOR IMPLANT N/A   . SPINAL FUSION N/A     Family Psychiatric History: Denies hx of mental illness in family. Denies hx of suicide or substance abuse issues in family.  Family History:  Family History  Problem Relation Age of Onset  . Heart disease Father   . Diabetes Maternal Grandmother     Social History:   Social History   Social History  . Marital status: Married    Spouse name: N/A  . Number of children: N/A  . Years of education: N/A   Social History Main Topics  . Smoking status: Former Games developer  . Smokeless tobacco: Current User    Types: Chew  . Alcohol use 1.8 oz/week    3 Cans of beer per week     Comment: occaswional  . Drug use: No  . Sexual activity: Not Currently   Other Topics Concern  . None   Social History Narrative  . None    Additional Social History: Born in [place]. Graduated HS. Married. Lives in [place] .Has three children, adults. The youngest one lives with him. 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 He enjoys working in the garden .   Allergies:  No Known Allergies  Metabolic Disorder Labs: No results found for: HGBA1C, MPG No results found for: PROLACTIN Lab Results  Component Value Date   CHOL 200 12/30/2014   TRIG 78.0 12/30/2014   HDL 55.60 12/30/2014   CHOLHDL 4 12/30/2014   VLDL 15.6 12/30/2014   LDLCALC 128 (H) 12/30/2014   LDLCALC 117 (H) 03/03/2011     Current Medications: Current Outpatient Prescriptions  Medication Sig Dispense Refill  . celecoxib (CELEBREX) 200 MG capsule Take 1 capsule (200 mg total) by mouth 2 (two) times daily. 60 capsule 0  . esomeprazole (NEXIUM) 40 MG capsule Take 1 capsule (40 mg total)  by mouth 2 (two) times daily before a meal. 60 capsule 0  . ibuprofen (ADVIL,MOTRIN) 200 MG tablet Take 200 mg by mouth every 6 (six) hours as needed. 2-3 tablets 2-3 times per day    . morphine (MS CONTIN) 30 MG 12 hr tablet Take 1 tablet (30 mg total) by mouth every 8 (eight) hours. 90 tablet 0  . morphine (MSIR) 15 MG tablet Take 1 tablet (15 mg total) by mouth every 6 (six) hours as needed for severe pain. 120 tablet 0  . Multiple Vitamin (MULTIVITAMIN) capsule Take 1 capsule by mouth daily.    . naproxen (NAPROSYN) 250 MG tablet Take 250 mg by mouth 3 (three) times daily.    Marland Kitchen olopatadine (PATANOL) 0.1 % ophthalmic solution      No current facility-administered medications for this visit.     Neurologic: Headache: Negative Seizure: No Paresthesias:No  Musculoskeletal: Strength & Muscle Tone: within normal limits Gait & Station: normal Patient leans: N/A  Psychiatric Specialty Exam: Review of Systems  Musculoskeletal: Positive for back pain.  Psychiatric/Behavioral: Negative for depression, hallucinations, substance abuse and suicidal ideas. The patient has insomnia. The patient is not nervous/anxious.   All other systems reviewed and are negative.   Blood pressure 110/70, pulse (!) 54, temperature 98.3 F (36.8 C), temperature source Oral, weight 189 lb 6.4 oz (85.9 kg).Body mass index is 27.18 kg/m.  General Appearance: Casual  Eye Contact:  Fair  Speech:  Clear and Coherent  Volume:  Normal  Mood:  Euthymic  Affect:  Congruent  Thought Process:  Goal Directed and Descriptions of Associations: Intact  Orientation:  Full (Time, Place, and Person)  Thought Content:  Logical  Suicidal Thoughts:  No  Homicidal Thoughts:  No  Memory:  Immediate;   Fair Recent;   Fair Remote;   Fair  Judgement:  Fair  Insight:  Fair  Psychomotor Activity:  Normal  Concentration:  Concentration: Fair and Attention Span: Fair  Recall:  [employer] of Knowledge:Fair  Language: Fair   Akathisia:  No  Handed:  Right  AIMS (if indicated):  na  Assets:  Communication Skills Desire for Improvement Housing Intimacy Social Support Talents/Skills Transportation  ADL's:  Intact  Cognition: WNL  Sleep:  Has sleep issues on and off     Treatment Plan Summary:Lance Roman is a 60 y old CM who is married , employed, denies hx of mental illness or treatment . He presents to [facility] today referred by his pain management provider Dr.Naveira . Pt today denies any significant depressive, anxiety sx . He does have sleep issues which he attributes to his pain issues. He currently denies the need for treatment for the same. He agrees to return if he feels he needs any help  with his sleep or has other concerns.  PHQ 9 - 1   Medication management and Plan see below  Discussed to follow up if he needs help in the future. His sleep may likely improve if his pain is better managed. Reviewed labs in EHR . Reviewed medical records in EHR as well as records from Dr.Naveiro . At this time he is psychiatrically cleared and does not need any treatment.   Jomarie Longs, MD 10/16/20181:44 PM

## 2017-01-19 ENCOUNTER — Ambulatory Visit (HOSPITAL_BASED_OUTPATIENT_CLINIC_OR_DEPARTMENT_OTHER): Payer: Worker's Compensation | Admitting: Pain Medicine

## 2017-01-19 ENCOUNTER — Encounter: Payer: Self-pay | Admitting: Pain Medicine

## 2017-01-19 ENCOUNTER — Ambulatory Visit
Admission: RE | Admit: 2017-01-19 | Discharge: 2017-01-19 | Disposition: A | Discharge status: Home / Self Care | Payer: Worker's Compensation | Source: Ambulatory Visit | Attending: Pain Medicine | Admitting: Pain Medicine

## 2017-01-19 VITALS — BP 109/64 | HR 54 | Temp 97.6°F | Resp 16 | Ht 70.0 in | Wt 190.0 lb

## 2017-01-19 DIAGNOSIS — M5442 Lumbago with sciatica, left side: Secondary | ICD-10-CM

## 2017-01-19 DIAGNOSIS — G8929 Other chronic pain: Secondary | ICD-10-CM | POA: Insufficient documentation

## 2017-01-19 DIAGNOSIS — M47816 Spondylosis without myelopathy or radiculopathy, lumbar region: Secondary | ICD-10-CM | POA: Diagnosis not present

## 2017-01-19 DIAGNOSIS — Z981 Arthrodesis status: Secondary | ICD-10-CM | POA: Diagnosis not present

## 2017-01-19 DIAGNOSIS — M545 Low back pain: Secondary | ICD-10-CM | POA: Insufficient documentation

## 2017-01-19 MED ORDER — TRIAMCINOLONE ACETONIDE 40 MG/ML IJ SUSP
INTRAMUSCULAR | Status: AC
  Filled 2017-01-19: qty 2

## 2017-01-19 MED ORDER — ROPIVACAINE HCL 2 MG/ML IJ SOLN
9.0000 mL | Freq: Once | INTRAMUSCULAR | Status: AC
  Administered 2017-01-19: 10 mL via PERINEURAL

## 2017-01-19 MED ORDER — LACTATED RINGERS IV SOLN
1000.0000 mL | Freq: Once | INTRAVENOUS | Status: AC
  Administered 2017-01-19: 1000 mL via INTRAVENOUS

## 2017-01-19 MED ORDER — ROPIVACAINE HCL 2 MG/ML IJ SOLN
INTRAMUSCULAR | Status: AC
  Filled 2017-01-19: qty 20

## 2017-01-19 MED ORDER — FENTANYL CITRATE (PF) 100 MCG/2ML IJ SOLN
25.0000 ug | INTRAMUSCULAR | Status: DC | PRN
  Administered 2017-01-19: 100 ug via INTRAVENOUS

## 2017-01-19 MED ORDER — LIDOCAINE HCL 2 % IJ SOLN
10.0000 mL | Freq: Once | INTRAMUSCULAR | Status: AC
  Administered 2017-01-19: 400 mg

## 2017-01-19 MED ORDER — TRIAMCINOLONE ACETONIDE 40 MG/ML IJ SUSP
40.0000 mg | Freq: Once | INTRAMUSCULAR | Status: AC
  Administered 2017-01-19: 40 mg

## 2017-01-19 MED ORDER — MIDAZOLAM HCL 5 MG/5ML IJ SOLN
INTRAMUSCULAR | Status: AC
  Filled 2017-01-19: qty 5

## 2017-01-19 MED ORDER — FENTANYL CITRATE (PF) 100 MCG/2ML IJ SOLN
INTRAMUSCULAR | Status: AC
  Filled 2017-01-19: qty 2

## 2017-01-19 MED ORDER — MIDAZOLAM HCL 5 MG/5ML IJ SOLN
1.0000 mg | INTRAMUSCULAR | Status: DC | PRN
  Administered 2017-01-19: 2 mg via INTRAVENOUS

## 2017-01-19 MED ORDER — LIDOCAINE HCL 2 % IJ SOLN
INTRAMUSCULAR | Status: AC
  Filled 2017-01-19: qty 20

## 2017-01-19 NOTE — Progress Notes (Signed)
Patient's Name: Lance Roman  MRN: [id number]  Referring Provider: Delano MetzNaveira, Henri Baumler, MD  DOB: Nov 16, 1960  PCP: Myrlene Brokerrawford, Elizabeth A, MD  DOS: 01/19/2017  Note by: Oswaldo DoneFrancisco A Josefine Fuhr, MD  Service setting: Ambulatory outpatient  Specialty: Interventional Pain Management  Patient type: Established  Location: [facility] (AMB) Pain Management Facility  Visit type: Interventional Procedure   Primary Reason for Visit: Interventional Pain Management Treatment. CC: Back Pain (lower)  Procedure:  Anesthesia, Analgesia, Anxiolysis:  Type: Diagnostic Medial Branch Facet Block Region: Lumbar Level: L2, L3, L4, L5, & S1 Medial Branch Level(s) Laterality: Bilateral  Type: Local Anesthesia with Moderate (Conscious) Sedation Local Anesthetic: Lidocaine 1% Route: Intravenous (IV) IV Access: Secured Sedation: Meaningful verbal contact was maintained at all times during the procedure  Indication(s): Analgesia and Anxiety   Indications: 1. Lumbar facet syndrome (Bilateral) (L>R)   2. Lumbar facet hypertrophy (Bilateral)   3. Lumbar facet osteoarthritis (Bilateral)   4. Osteoarthritis of lumbar spine   5. Chronic low back pain (Primary Area of Pain) (Bilateral) (L>R)    Pain Score: Pre-procedure: 4 /10 Post-procedure: 0-No pain/10  Pre-op Assessment:  Lance Roman is a 56 y.o. (year old), male patient, seen today for interventional treatment. He  has a past surgical history that includes Appendectomy; repairs of bilateral ankle fractures; Spinal fusion (N/A); and Spinal cord stimulator implant (N/A). Lance Roman has a current medication list which includes the following prescription(s): celecoxib, esomeprazole, morphine, morphine, multivitamin, naproxen, and olopatadine, and the following Facility-Administered Medications: fentanyl and midazolam. His primarily concern today is the Back Pain (lower)  Initial Vital Signs: There were no vitals taken for this visit. BMI: Estimated body mass index is 27.26  kg/m as calculated from the following:   Height as of this encounter: 5\' 10"  (1.778 m).   Weight as of this encounter: 190 lb (86.2 kg).  Risk Assessment: Allergies: Reviewed. He has No Known Allergies.  Allergy Precautions: None required Coagulopathies: Reviewed. None identified.  Blood-thinner therapy: None at this time Active Infection(s): Reviewed. None identified. Lance Roman is afebrile  Site Confirmation: Lance Roman was asked to confirm the procedure and laterality before marking the site Procedure checklist: Completed Consent: Before the procedure and under the influence of no sedative(s), amnesic(s), or anxiolytics, the patient was informed of the treatment options, risks and possible complications. To fulfill our ethical and legal obligations, as recommended by the American Medical Association's Code of Ethics, I have informed the patient of my clinical impression; the nature and purpose of the treatment or procedure; the risks, benefits, and possible complications of the intervention; the alternatives, including doing nothing; the risk(s) and benefit(s) of the alternative treatment(s) or procedure(s); and the risk(s) and benefit(s) of doing nothing. The patient was provided information about the general risks and possible complications associated with the procedure. These may include, but are not limited to: failure to achieve desired goals, infection, bleeding, organ or nerve damage, allergic reactions, paralysis, and death. In addition, the patient was informed of those risks and complications associated to Spine-related procedures, such as failure to decrease pain; infection (i.e.: Meningitis, epidural or intraspinal abscess); bleeding (i.e.: epidural hematoma, subarachnoid hemorrhage, or any other type of intraspinal or peri-dural bleeding); organ or nerve damage (i.e.: Any type of peripheral nerve, nerve root, or spinal cord injury) with subsequent damage to sensory, motor, and/or  autonomic systems, resulting in permanent pain, numbness, and/or weakness of one or several areas of the body; allergic reactions; (i.e.: anaphylactic reaction); and/or death. Furthermore, the patient was  informed of those risks and complications associated with the medications. These include, but are not limited to: allergic reactions (i.e.: anaphylactic or anaphylactoid reaction(s)); adrenal axis suppression; blood sugar elevation that in diabetics may result in ketoacidosis or comma; water retention that in patients with history of congestive heart failure may result in shortness of breath, pulmonary edema, and decompensation with resultant heart failure; weight gain; swelling or edema; medication-induced neural toxicity; particulate matter embolism and blood vessel occlusion with resultant organ, and/or nervous system infarction; and/or aseptic necrosis of one or more joints. Finally, the patient was informed that Medicine is not an exact science; therefore, there is also the possibility of unforeseen or unpredictable risks and/or possible complications that may result in a catastrophic outcome. The patient indicated having understood very clearly. We have given the patient no guarantees and we have made no promises. Enough time was given to the patient to ask questions, all of which were answered to the patient's satisfaction. Mr. Hutmacher has indicated that he wanted to continue with the procedure. Attestation: I, the ordering provider, attest that I have discussed with the patient the benefits, risks, side-effects, alternatives, likelihood of achieving goals, and potential problems during recovery for the procedure that I have provided informed consent. Date: 01/19/2017; Time: 7:12 AM  Pre-Procedure Preparation:  Monitoring: As per clinic protocol. Respiration, ETCO2, SpO2, BP, heart rate and rhythm monitor placed and checked for adequate function Safety Precautions: Patient was assessed for positional  comfort and pressure points before starting the procedure. Time-out: I initiated and conducted the "Time-out" before starting the procedure, as per protocol. The patient was asked to participate by confirming the accuracy of the "Time Out" information. Verification of the correct person, site, and procedure were performed and confirmed by me, the nursing staff, and the patient. "Time-out" conducted as per Joint Commission's Universal Protocol (UP.01.01.01). "Time-out" Date & Time: 01/19/2017; 0909 hrs.  Description of Procedure Process:   Position: Prone Target Area: For Lumbar Facet blocks, the target is the groove formed by the junction of the transverse process and superior articular process. For the L5 dorsal ramus, the target is the notch between superior articular process and sacral ala. For the S1 dorsal ramus, the target is the superior and lateral edge of the posterior S1 Sacral foramen. Approach: Paramedial approach. Area Prepped: Entire Posterior Lumbosacral Region Prepping solution: ChloraPrep (2% chlorhexidine gluconate and 70% isopropyl alcohol) Safety Precautions: Aspiration looking for blood return was conducted prior to all injections. At no point did we inject any substances, as a needle was being advanced. No attempts were made at seeking any paresthesias. Safe injection practices and needle disposal techniques used. Medications properly checked for expiration dates. SDV (single dose vial) medications used. Description of the Procedure: Protocol guidelines were followed. The patient was placed in position over the fluoroscopy table. The target area was identified and the area prepped in the usual manner. Skin desensitized using vapocoolant spray. Skin & deeper tissues infiltrated with local anesthetic. Appropriate amount of time allowed to pass for local anesthetics to take effect. The procedure needle was introduced through the skin, ipsilateral to the reported pain, and advanced to  the target area. Employing the "Medial Branch Technique", the needles were advanced to the angle made by the superior and medial portion of the transverse process, and the lateral and inferior portion of the superior articulating process of the targeted vertebral bodies. This area is known as "Burton's Eye" or the "Eye of the [place] Dog". A procedure needle was  introduced through the skin, and this time advanced to the angle made by the superior and medial border of the sacral ala, and the lateral border of the S1 vertebral body. This last needle was later repositioned at the superior and lateral border of the posterior S1 foramen. Negative aspiration confirmed. Solution injected in intermittent fashion, asking for systemic symptoms every 0.5cc of injectate. The needles were then removed and the area cleansed, making sure to leave some of the prepping solution back to take advantage of its long term bactericidal properties.   Illustration of the posterior view of the lumbar spine and the posterior neural structures. Laminae of L2 through S1 are labeled. DPRL5, dorsal primary ramus of L5; DPRS1, dorsal primary ramus of S1; DPR3, dorsal primary ramus of L3; FJ, facet (zygapophyseal) joint L3-L4; I, inferior articular process of L4; LB1, lateral branch of dorsal primary ramus of L1; IAB, inferior articular branches from L3 medial branch (supplies L4-L5 facet joint); IBP, intermediate branch plexus; MB3, medial branch of dorsal primary ramus of L3; NR3, third lumbar nerve root; S, superior articular process of L5; SAB, superior articular branches from L4 (supplies L4-5 facet joint also); TP3, transverse process of L3.  Vitals:   01/19/17 0925 01/19/17 0934 01/19/17 0944 01/19/17 0954  BP: 124/65 114/72 102/64 109/64  Pulse:      Resp: 10 10 10 16   Temp:  97.6 F (36.4 C)    TempSrc:  Oral    SpO2: 100% 98% 100% 100%  Weight:      Height:        Start Time: 0909 hrs. End Time: 0924 hrs. Materials:    Needle(s) Type: Regular needle Gauge: 22G Length: 3.5-in Medication(s): We administered lactated ringers, midazolam, fentaNYL, lidocaine, triamcinolone acetonide, ropivacaine (PF) 2 mg/mL (0.2%), triamcinolone acetonide, and ropivacaine (PF) 2 mg/mL (0.2%). Please see chart orders for dosing details.  Imaging Guidance (Spinal):  Type of Imaging Technique: Fluoroscopy Guidance (Spinal) Indication(s): Assistance in needle guidance and placement for procedures requiring needle placement in or near specific anatomical locations not easily accessible without such assistance. Exposure Time: Please see nurses notes. Contrast: None used. Fluoroscopic Guidance: I was personally present during the use of fluoroscopy. "Tunnel Vision Technique" used to obtain the best possible view of the target area. Parallax error corrected before commencing the procedure. "Direction-depth-direction" technique used to introduce the needle under continuous pulsed fluoroscopy. Once target was reached, antero-posterior, oblique, and lateral fluoroscopic projection used confirm needle placement in all planes. Images permanently stored in EMR. Interpretation: No contrast injected. I personally interpreted the imaging intraoperatively. Adequate needle placement confirmed in multiple planes. Permanent images saved into the patient's record.  Antibiotic Prophylaxis:  Indication(s): None identified Antibiotic given: None  Post-operative Assessment:  EBL: None Complications: No immediate post-treatment complications observed by team, or reported by patient. Note: The patient tolerated the entire procedure well. A repeat set of vitals were taken after the procedure and the patient was kept under observation following institutional policy, for this type of procedure. Post-procedural neurological assessment was performed, showing return to baseline, prior to discharge. The patient was provided with post-procedure discharge  instructions, including a section on how to identify potential problems. Should any problems arise concerning this procedure, the patient was given instructions to immediately contact us, at any time, without hesitation. In any case, we plan to contact the patient by telephone for a follow-up status report regarding this interventional procedure. Comments:  No additional relevant information.  Plan of Care  Imaging Orders     DG C-Arm 1-60 Min-No Report  Procedure Orders     LUMBAR FACET(MEDIAL BRANCH NERVE BLOCK) MBNB  Medications ordered for procedure: Meds ordered this encounter  Medications  . lactated ringers infusion 1,000 mL  . midazolam (VERSED) 5 MG/5ML injection 1-2 mg    Make sure Flumazenil is available in the pyxis when using this medication. If oversedation occurs, administer 0.2 mg IV over 15 sec. If after 45 sec no response, administer 0.2 mg again over 1 min; may repeat at 1 min intervals; not to exceed 4 doses (1 mg)  . fentaNYL (SUBLIMAZE) injection 25-50 mcg    Make sure Narcan is available in the pyxis when using this medication. In the event of respiratory depression (RR< 8/min): Titrate NARCAN (naloxone) in increments of 0.1 to 0.2 mg IV at 2-3 minute intervals, until desired degree of reversal.  . lidocaine (XYLOCAINE) 2 % (with pres) injection 200 mg  . triamcinolone acetonide (KENALOG-40) injection 40 mg  . ropivacaine (PF) 2 mg/mL (0.2%) (NAROPIN) injection 9 mL  . triamcinolone acetonide (KENALOG-40) injection 40 mg  . ropivacaine (PF) 2 mg/mL (0.2%) (NAROPIN) injection 9 mL   Medications administered: We administered lactated ringers, midazolam, fentaNYL, lidocaine, triamcinolone acetonide, ropivacaine (PF) 2 mg/mL (0.2%), triamcinolone acetonide, and ropivacaine (PF) 2 mg/mL (0.2%).  See the medical record for exact dosing, route, and time of administration.  New Prescriptions   No medications on file   Disposition: Discharge home  Discharge Date &  Time: 01/19/2017; 1000 hrs.   Physician-requested Follow-up: Return for post-procedure eval by Dr. Laban Emperor in 2 wks. No future appointments. Primary Care Physician: Myrlene Broker, MD Location: [facility] Note by: Oswaldo Done, MD Date: 01/19/2017; Time: 10:48 AM  Disclaimer:  Medicine is not an exact science. The only guarantee in medicine is that nothing is guaranteed. It is important to note that the decision to proceed with this intervention was based on the information collected from the patient. The Data and conclusions were drawn from the patient's questionnaire, the interview, and the physical examination. Because the information was provided in large part by the patient, it cannot be guaranteed that it has not been purposely or unconsciously manipulated. Every effort has been made to obtain as much relevant data as possible for this evaluation. It is important to note that the conclusions that lead to this procedure are derived in large part from the available data. Always take into account that the treatment will also be dependent on availability of resources and existing treatment guidelines, considered by other Pain Management Practitioners as being common knowledge and practice, at the time of the intervention. For Medico-Legal purposes, it is also important to point out that variation in procedural techniques and pharmacological choices are the acceptable norm. The indications, contraindications, technique, and results of the above procedure should only be interpreted and judged by a Board-Certified Interventional Pain Specialist with extensive familiarity and expertise in the same exact procedure and technique.

## 2017-01-19 NOTE — Patient Instructions (Signed)

## 2017-01-19 NOTE — Progress Notes (Signed)
Safety precautions to be maintained throughout the outpatient stay will include: orient to surroundings, keep bed in low position, maintain call bell within reach at all times, provide assistance with transfer out of bed and ambulation.  

## 2017-01-20 ENCOUNTER — Telehealth: Payer: Self-pay

## 2017-01-20 NOTE — Telephone Encounter (Signed)
Post procedure phone call.  Left message.  

## 2017-01-30 ENCOUNTER — Other Ambulatory Visit: Payer: Self-pay | Admitting: Nurse Practitioner

## 2017-01-30 ENCOUNTER — Encounter: Payer: Self-pay | Admitting: Nurse Practitioner

## 2017-01-30 ENCOUNTER — Ambulatory Visit: Payer: Worker's Compensation | Attending: Nurse Practitioner | Admitting: Nurse Practitioner

## 2017-01-30 VITALS — BP 133/75 | HR 51 | Temp 98.8°F | Resp 16 | Ht 70.0 in | Wt 190.0 lb

## 2017-01-30 DIAGNOSIS — Z87891 Personal history of nicotine dependence: Secondary | ICD-10-CM | POA: Insufficient documentation

## 2017-01-30 DIAGNOSIS — M79605 Pain in left leg: Secondary | ICD-10-CM

## 2017-01-30 DIAGNOSIS — M5410 Radiculopathy, site unspecified: Secondary | ICD-10-CM | POA: Diagnosis not present

## 2017-01-30 DIAGNOSIS — M5137 Other intervertebral disc degeneration, lumbosacral region: Secondary | ICD-10-CM | POA: Diagnosis not present

## 2017-01-30 DIAGNOSIS — M5442 Lumbago with sciatica, left side: Secondary | ICD-10-CM

## 2017-01-30 DIAGNOSIS — Z79891 Long term (current) use of opiate analgesic: Secondary | ICD-10-CM | POA: Insufficient documentation

## 2017-01-30 DIAGNOSIS — E785 Hyperlipidemia, unspecified: Secondary | ICD-10-CM | POA: Insufficient documentation

## 2017-01-30 DIAGNOSIS — G894 Chronic pain syndrome: Secondary | ICD-10-CM | POA: Diagnosis not present

## 2017-01-30 DIAGNOSIS — G8929 Other chronic pain: Secondary | ICD-10-CM

## 2017-01-30 DIAGNOSIS — K219 Gastro-esophageal reflux disease without esophagitis: Secondary | ICD-10-CM | POA: Insufficient documentation

## 2017-01-30 DIAGNOSIS — Z5181 Encounter for therapeutic drug level monitoring: Secondary | ICD-10-CM | POA: Diagnosis not present

## 2017-01-30 DIAGNOSIS — M47816 Spondylosis without myelopathy or radiculopathy, lumbar region: Secondary | ICD-10-CM | POA: Insufficient documentation

## 2017-01-30 MED ORDER — ESOMEPRAZOLE MAGNESIUM 40 MG PO CPDR: 40.0000 mg | DELAYED_RELEASE_CAPSULE | Freq: Two times a day (BID) | ORAL | 0 refills | Status: DC

## 2017-01-30 MED ORDER — MORPHINE SULFATE 15 MG PO TABS: 15.0000 mg | ORAL_TABLET | Freq: Four times a day (QID) | ORAL | 0 refills | Status: DC | PRN

## 2017-01-30 MED ORDER — MORPHINE SULFATE ER 30 MG PO TBCR: 30.0000 mg | EXTENDED_RELEASE_TABLET | Freq: Three times a day (TID) | ORAL | 0 refills | Status: DC

## 2017-01-30 MED ORDER — CELECOXIB 200 MG PO CAPS: 200.0000 mg | ORAL_CAPSULE | Freq: Two times a day (BID) | ORAL | 0 refills | Status: DC

## 2017-01-30 NOTE — Progress Notes (Signed)
Nursing Pain Medication Assessment:  Safety precautions to be maintained throughout the outpatient stay will include: orient to surroundings, keep bed in low position, maintain call bell within reach at all times, provide assistance with transfer out of bed and ambulation.  Medication Inspection Compliance: Pill count conducted under aseptic conditions, in front of the patient. Neither the pills nor the bottle was removed from the patient's sight at any time. Once count was completed pills were immediately returned to the patient in their original bottle.  Medication #1: Morphine IR Pill/Patch Count: 36 of 120 pills remain Pill/Patch Appearance: Markings consistent with prescribed medication Bottle Appearance: Standard pharmacy container. Clearly labeled. Filled Date: 10/ 15/ 2018 Last Medication intake:  Today  Medication #2: Morphine ER (MSContin) Pill/Patch Count: 29 of 90 pills remain Pill/Patch Appearance: Markings consistent with prescribed medication Bottle Appearance: Standard pharmacy container. Clearly labeled. Filled Date: 10/ 15 2018 Last Medication intake:  Today

## 2017-01-30 NOTE — Progress Notes (Signed)
Patient's Name: Lance Roman  MRN: [id number]  Referring Provider: Hoyt Koch, *  DOB: [birth year]  PCP: Hoyt Koch, MD  DOS: 01/30/2017  Note by: Vevelyn Francois NP  Service setting: Ambulatory outpatient  Specialty: Interventional Pain Management  Location: [facility] (AMB) Pain Management Facility    Patient type: Established    Primary Reason(s) for Visit: Encounter for prescription drug management & post-procedure evaluation of chronic illness with mild to moderate exacerbation(Level of risk: moderate) CC: Back Pain (low and mid)  HPI  Lance Roman is a 56 y.o. year old, male patient, who comes today for a post-procedure evaluation and medication management. He has GERD; Chronic low back pain (Primary Area of Pain) (Bilateral) (L>R); Pharmacologic therapy; Infected sebaceous cyst; Disorder of skeletal system; Problems influencing health status; Opiate use (150 MME/day); Long term prescription opiate use; Chronic pain syndrome; Muscle cramps; Long-term current use of opiate analgesic; Chronic lower extremity pain (Secondary Area of Pain) (Left); Chronic lumbar radicular pain (L5) (Left); Retrolisthesis (4-5 mm) (L3 over L4) (L2 over L3; Failed back surgical syndrome (Fusion L4-S1); Lumbar facet hypertrophy (Bilateral); Lumbar facet syndrome (Bilateral) (L>R); Spinal cord stimulator dysfunction (fractured leads); Lumbar facet osteoarthritis (Bilateral); and Osteoarthritis of lumbar spine on their problem list. His primarily concern today is the Back Pain (low and mid)  Pain Assessment: Location: Lower Back Radiating: radiates down left leg to heel in the back Onset: More than a month ago Duration: Chronic pain Quality: Constant, Radiating Severity: 2 /10 (self-reported pain score)  Note: Reported level is compatible with observation.                          Effect on ADL:   Timing: Constant Modifying factors: medications and exercise  [name] was last seen on Visit date  not found for a procedure. During today's appointment we reviewed [name]'s post-procedure results, as well as his outpatient medication regimen. He denies any numbness and tingling. He denies any weakness. He had his CT scan on Friday at [facility]; disc in hand. He denies appointment with the surgeon  Further details on both, my assessment(s), as well as the proposed treatment plan, please see below.  Controlled Substance Pharmacotherapy Assessment REMS (Risk Evaluation and Mitigation Strategy)  Analgesic: Morphine ER 30 mg every 8 hours + morphine IR 15 mg every 6 hours (150MME/day) MME/day: 143m/day  Tice, Kori M, RN  01/30/2017 10:17 AM  Signed Nursing Pain Medication Assessment:  Safety precautions to be maintained throughout the outpatient stay will include: orient to surroundings, keep bed in low position, maintain call bell within reach at all times, provide assistance with transfer out of bed and ambulation.  Medication Inspection Compliance: Pill count conducted under aseptic conditions, in front of the patient. Neither the pills nor the bottle was removed from the patient's sight at any time. Once count was completed pills were immediately returned to the patient in their original bottle.  Medication #1: Morphine IR Pill/Patch Count: 36 of 120 pills remain Pill/Patch Appearance: Markings consistent with prescribed medication Bottle Appearance: Standard pharmacy container. Clearly labeled. Filled Date: 10/ 15/ 2018 Last Medication intake:  Today  Medication #2: Morphine ER (MSContin) Pill/Patch Count: 29 of 90 pills remain Pill/Patch Appearance: Markings consistent with prescribed medication Bottle Appearance: Standard pharmacy container. Clearly labeled. Filled Date: 10/ 15 2018 Last Medication intake:  Today   Pharmacokinetics: Liberation and absorption (onset of action): WNL Distribution (time to peak effect): WNL Metabolism  and excretion (duration of action): WNL          Pharmacodynamics: Desired effects: Analgesia: Lance Roman reports >50% benefit. Functional ability: Patient reports that medication allows him to accomplish basic ADLs Clinically meaningful improvement in function (CMIF): Sustained CMIF goals met Perceived effectiveness: Described as relatively effective, allowing for increase in activities of daily living (ADL) Undesirable effects: Side-effects or Adverse reactions: None reported Monitoring: [place] PMP: Online review of the past 35-monthperiod conducted. Compliant with practice rules and regulations Last UDS on record: Summary  Date Value Ref Range Status  12/12/2016 FINAL  Final    Comment:    ==================================================================== TOXAS[facility]RE COMP DRUG ANALYSIS,UR ==================================================================== Test                             Result       Flag       Units Drug Present and Declared for Prescription Verification   Morphine                       >6135        EXPECTED   ng/mg creat   Normorphine                    1126         EXPECTED   ng/mg creat    Potential sources of large amounts of morphine in the absence of    codeine include administration of morphine or use of heroin.    Normorphine is an expected metabolite of morphine.   Hydromorphone                  447          EXPECTED   ng/mg creat    Hydromorphone may be present as a metabolite of morphine;    concentrations of hydromorphone rarely exceed 5% of the morphine    concentration when this is the source of hydromorphone. Drug Present not Declared for Prescription Verification   Ibuprofen                      PRESENT      UNEXPECTED   Naproxen                       PRESENT      UNEXPECTED ==================================================================== Test                      Result    Flag   Units      Ref Range   Creatinine              163              mg/dL       >=20 ==================================================================== Declared Medications:  The flagging and interpretation on this report are based on the  following declared medications.  Unexpected results may arise from  inaccuracies in the declared medications.  **Note: The testing scope of this panel includes these medications:  Morphine (MSIR)  Morphine (Morphine Sulfate)  **Note: The testing scope of this panel does not include following  reported medications:  Celecoxib  Esomeprazole  Olopatadine ==================================================================== For clinical consultation, please call ([phone] ====================================================================    UDS interpretation: Compliant          Medication Assessment Form: Reviewed. Patient indicates being compliant with therapy Treatment compliance: Compliant Risk  Assessment Profile: Aberrant behavior: See prior evaluations. None observed or detected today Comorbid factors increasing risk of overdose: See prior notes. No additional risks detected today Risk of substance use disorder (SUD): Low Opioid Risk Tool - 01/19/17 0802      Family History of Substance Abuse   Alcohol  Negative    Illegal Drugs  Negative    Rx Drugs  Negative      Personal History of Substance Abuse   Alcohol  Negative    Illegal Drugs  Negative    Rx Drugs  Negative      Age   Age between 44-45 years   No      History of Preadolescent Sexual Abuse   History of Preadolescent Sexual Abuse  Negative or Male      Psychological Disease   Psychological Disease  Negative    Depression  Negative      Total Score   Opioid Risk Tool Scoring  0    Opioid Risk Interpretation  Low Risk      ORT Scoring interpretation table:  Score <3 = Low Risk for SUD  Score between 4-7 = Moderate Risk for SUD  Score >8 = High Risk for Opioid Abuse   Risk Mitigation Strategies:  Patient Counseling:  Covered Patient-Prescriber Agreement (PPA): Present and active  Notification to other healthcare providers: Done  Pharmacologic Plan: No change in therapy, at this time  Post-Procedure Assessment  01/19/17 not found Procedure: lumbar facet Pre-procedure pain score:  4/10 Post-procedure pain score: 0/10         Influential Factors: BMI: 27.26 kg/m Intra-procedural challenges: None observed.         Assessment challenges: None detected.              Reported side-effects: None.        Post-procedural adverse reactions or complications: None reported         Sedation: Please see nurses note. When no sedatives are used, the analgesic levels obtained are directly associated to the effectiveness of the local anesthetics. However, when sedation is provided, the level of analgesia obtained during the initial 1 hour following the intervention, is believed to be the result of a combination of factors. These factors may include, but are not limited to: 1. The effectiveness of the local anesthetics used. 2. The effects of the analgesic(s) and/or anxiolytic(s) used. 3. The degree of discomfort experienced by the patient at the time of the procedure. 4. The patients ability and reliability in recalling and recording the events. 5. The presence and influence of possible secondary gains and/or psychosocial factors. Reported result: Relief experienced during the 1st hour after the procedure: 50 % (Ultra-Short Term Relief)            Interpretative annotation: Clinically appropriate result. Analgesia during this period is likely to be Local Anesthetic and/or IV Sedative (Analgesic/Anxiolytic) related.          Effects of local anesthetic: The analgesic effects attained during this period are directly associated to the localized infiltration of local anesthetics and therefore cary significant diagnostic value as to the etiological location, or anatomical origin, of the pain. Expected duration of relief is  directly dependent on the pharmacodynamics of the local anesthetic used. Long-acting (4-6 hours) anesthetics used.  Reported result: Relief during the next 4 to 6 hour after the procedure: 50 % (Short-Term Relief)            Interpretative annotation: Clinically appropriate result. Analgesia during this  period is likely to be Local Anesthetic-related.          Long-term benefit: Defined as the period of time past the expected duration of local anesthetics (1 hour for short-acting and 4-6 hours for long-acting). With the possible exception of prolonged sympathetic blockade from the local anesthetics, benefits during this period are typically attributed to, or associated with, other factors such as analgesic sensory neuropraxia, antiinflammatory effects, or beneficial biochemical changes provided by agents other than the local anesthetics.  Reported result: Extended relief following procedure: 0 % (Long-Term Relief)            Interpretative annotation: Clinically appropriate result. Good relief. No permanent benefit expected. Inflammation plays a part in the etiology to the pain.          Current benefits: Defined as reported results that persistent at this point in time.   Analgesia: 0 %            Function: Somewhat improved ROM: Somewhat improved Interpretative annotation: Recurrence of symptoms. No permanent benefit expected.             Interpretation: Results would suggest failure of therapy in achieving desired goal(s).                  Plan:  Please see "Plan of Care" for details.        Laboratory Chemistry  Inflammation Markers (CRP: Acute Phase) (ESR: Chronic Phase) Lab Results  Component Value Date   CRP 2.5 12/12/2016   ESRSEDRATE 10 12/12/2016                 Renal Function Markers Lab Results  Component Value Date   BUN 20 12/12/2016   CREATININE 0.74 (L) 12/12/2016   GFRAA 120 12/12/2016   GFRNONAA 104 12/12/2016                 Hepatic Function Markers Lab Results   Component Value Date   AST 21 12/12/2016   ALT 12 12/30/2014   ALBUMIN 4.5 12/12/2016   ALKPHOS 66 12/12/2016   HCVAB NEGATIVE 12/30/2014                 Electrolytes Lab Results  Component Value Date   NA 140 12/12/2016   K 4.5 12/12/2016   CL 99 12/12/2016   CALCIUM 9.7 12/12/2016   MG 1.9 12/12/2016                 Neuropathy Markers Lab Results  Component Value Date   GBMSXJDB52 080 12/12/2016                 Bone Pathology Markers Lab Results  Component Value Date   ALKPHOS 66 12/12/2016   25OHVITD1 44 12/12/2016   25OHVITD2 <1.0 12/12/2016   25OHVITD3 43 12/12/2016   CALCIUM 9.7 12/12/2016                 Coagulation Parameters Lab Results  Component Value Date   PLT 167.0 03/03/2011                 Cardiovascular Markers Lab Results  Component Value Date   HGB 13.1 03/03/2011   HCT 38.5 (L) 03/03/2011                 Note: Lab results reviewed.  Recent Diagnostic Imaging Results  DG C-Arm 1-60 Min-No Report Fluoroscopy was utilized by the requesting physician.  No radiographic  interpretation.   Complexity Note: Imaging results  reviewed. Results shared with Mr. Speelman, using Layman's terms.                         Meds   Current Outpatient Medications:  .  celecoxib (CELEBREX) 200 MG capsule, Take 1 capsule (200 mg total) 2 (two) times daily by mouth., Disp: 60 capsule, Rfl: 0 .  esomeprazole (NEXIUM) 40 MG capsule, Take 1 capsule (40 mg total) 2 (two) times daily before a meal by mouth., Disp: 60 capsule, Rfl: 0 .  [START ON 02/08/2017] morphine (MS CONTIN) 30 MG 12 hr tablet, Take 1 tablet (30 mg total) every 8 (eight) hours by mouth., Disp: 90 tablet, Rfl: 0 .  [START ON 02/08/2017] morphine (MSIR) 15 MG tablet, Take 1 tablet (15 mg total) every 6 (six) hours as needed by mouth for severe pain., Disp: 120 tablet, Rfl: 0 .  Multiple Vitamin (MULTIVITAMIN) capsule, Take 1 capsule by mouth daily., Disp: , Rfl:  .  olopatadine (PATANOL) 0.1 %  ophthalmic solution, Place 1 drop into both eyes as needed. , Disp: , Rfl:   ROS  Constitutional: Denies any fever or chills Gastrointestinal: No reported hemesis, hematochezia, vomiting, or acute GI distress Musculoskeletal: Denies any acute onset joint swelling, redness, loss of ROM, or weakness Neurological: No reported episodes of acute onset apraxia, aphasia, dysarthria, agnosia, amnesia, paralysis, loss of coordination, or loss of consciousness  Allergies  Mr. Urbieta has No Known Allergies.  [place]  Drug: Mr. Schumpert  reports that he does not use drugs. Alcohol:  reports that he drinks about 1.8 oz of alcohol per week. Tobacco:  reports that he has quit smoking. His smokeless tobacco use includes chew. Medical:  has a past medical history of Allergic rhinitis, Chronic pain syndrome, DDD (degenerative disc disease), lumbosacral, GERD (gastroesophageal reflux disease), Hyperlipidemia, Osteoarthritis, and S/P appendectomy. Surgical: Mr. Tilly  has a past surgical history that includes Appendectomy; repairs of bilateral ankle fractures; Spinal fusion (N/A); and Spinal cord stimulator implant (N/A). Family: family history includes Diabetes in his maternal grandmother; Heart disease in his father.  Constitutional Exam  General appearance: Well nourished, well developed, and well hydrated. In no apparent acute distress Vitals:   01/30/17 1005  BP: 133/75  Pulse: (!) 51  Resp: 16  Temp: 98.8 F (37.1 C)  SpO2: 100%  Weight: 190 lb (86.2 kg)  Height: 5' 10" (1.778 m)  Psych/Mental status: Alert, oriented x 3 (person, place, & time)       Eyes: PERLA Respiratory: No evidence of acute respiratory distress  Cervical Spine Area Exam  Skin & Axial Inspection: No masses, redness, edema, swelling, or associated skin lesions Alignment: Symmetrical Functional ROM: Unrestricted ROM      Stability: No instability detected Muscle Tone/Strength: Functionally intact. No obvious neuro-muscular  anomalies detected. Sensory (Neurological): Unimpaired Palpation: No palpable anomalies              Upper Extremity (UE) Exam    Side: Right upper extremity  Side: Left upper extremity  Skin & Extremity Inspection: Skin color, temperature, and hair growth are WNL. No peripheral edema or cyanosis. No masses, redness, swelling, asymmetry, or associated skin lesions. No contractures.  Skin & Extremity Inspection: Skin color, temperature, and hair growth are WNL. No peripheral edema or cyanosis. No masses, redness, swelling, asymmetry, or associated skin lesions. No contractures.  Functional ROM: Unrestricted ROM          Functional ROM: Unrestricted ROM  Muscle Tone/Strength: Functionally intact. No obvious neuro-muscular anomalies detected.  Muscle Tone/Strength: Functionally intact. No obvious neuro-muscular anomalies detected.  Sensory (Neurological): Unimpaired          Sensory (Neurological): Unimpaired          Palpation: No palpable anomalies              Palpation: No palpable anomalies              Specialized Test(s): Deferred         Specialized Test(s): Deferred          Thoracic Spine Area Exam  Skin & Axial Inspection: No masses, redness, or swelling Alignment: Symmetrical Functional ROM: Unrestricted ROM Stability: No instability detected Muscle Tone/Strength: Functionally intact. No obvious neuro-muscular anomalies detected. Sensory (Neurological): Unimpaired Muscle strength & Tone: No palpable anomalies  Lumbar Spine Area Exam  Skin & Axial Inspection: Well healed scar from previous spine surgery detected SCS patent Alignment: Symmetrical Functional ROM: Unrestricted ROM      Stability: No instability detected Muscle Tone/Strength: Functionally intact. No obvious neuro-muscular anomalies detected. Sensory (Neurological): Unimpaired Palpation: No palpable anomalies       Provocative Tests: Lumbar Hyperextension and rotation test: evaluation deferred today        Lumbar Lateral bending test: evaluation deferred today       Patrick's Maneuver: evaluation deferred today                    Gait & Posture Assessment  Ambulation: Unassisted Gait: Relatively normal for age and body habitus Posture: WNL   Lower Extremity Exam    Side: Right lower extremity  Side: Left lower extremity  Skin & Extremity Inspection: Skin color, temperature, and hair growth are WNL. No peripheral edema or cyanosis. No masses, redness, swelling, asymmetry, or associated skin lesions. No contractures.  Skin & Extremity Inspection: Skin color, temperature, and hair growth are WNL. No peripheral edema or cyanosis. No masses, redness, swelling, asymmetry, or associated skin lesions. No contractures.  Functional ROM: Unrestricted ROM          Functional ROM: Unrestricted ROM          Muscle Tone/Strength: Functionally intact. No obvious neuro-muscular anomalies detected.  Muscle Tone/Strength: Functionally intact. No obvious neuro-muscular anomalies detected.  Sensory (Neurological): Unimpaired  Sensory (Neurological): Unimpaired  Palpation: No palpable anomalies  Palpation: No palpable anomalies   Assessment  Primary Diagnosis & Pertinent Problem List: The primary encounter diagnosis was Chronic low back pain (Primary Area of Pain) (Bilateral) (L>R). Diagnoses of Chronic lower extremity pain (Secondary Area of Pain) (Left), Chronic lumbar radicular pain (L5) (Left), Chronic pain syndrome, Long-term current use of opiate analgesic, Gastroesophageal reflux disease without esophagitis, Lumbar facet osteoarthritis (Bilateral), and Osteoarthritis of lumbar spine were also pertinent to this visit.  Status Diagnosis  Controlled Controlled Controlled 1. Chronic low back pain (Primary Area of Pain) (Bilateral) (L>R)   2. Chronic lower extremity pain (Secondary Area of Pain) (Left)   3. Chronic lumbar radicular pain (L5) (Left)   4. Chronic pain syndrome   5. Long-term current use of  opiate analgesic   6. Gastroesophageal reflux disease without esophagitis   7. Lumbar facet osteoarthritis (Bilateral)   8. Osteoarthritis of lumbar spine     Problems updated and reviewed during this visit: No problems updated. Plan of Care  Pharmacotherapy (Medications Ordered): Meds ordered this encounter  Medications  . morphine (MS CONTIN) 30 MG 12 hr tablet    Sig:  Take 1 tablet (30 mg total) every 8 (eight) hours by mouth.    Dispense:  90 tablet    Refill:  0    Fill one day early if pharmacy is closed on scheduled refill date. Do not fill until: 02/08/2017 To last until:03/10/2017    Order Specific Question:   Supervising Provider    Answer:   Milinda Pointer [phone]  . morphine (MSIR) 15 MG tablet    Sig: Take 1 tablet (15 mg total) every 6 (six) hours as needed by mouth for severe pain.    Dispense:  120 tablet    Refill:  0    Fill one day early if pharmacy is closed on scheduled refill date. Do not fill until: 02/08/2017 To last until: 03/10/2017    Order Specific Question:   Supervising Provider    Answer:   Milinda Pointer [phone]  . esomeprazole (NEXIUM) 40 MG capsule    Sig: Take 1 capsule (40 mg total) 2 (two) times daily before a meal by mouth.    Dispense:  60 capsule    Refill:  0    Do not place medication on "Automatic Refill". Fill one day early if pharmacy is closed on scheduled refill date.    Order Specific Question:   Supervising Provider    Answer:   Milinda Pointer [phone]  . celecoxib (CELEBREX) 200 MG capsule    Sig: Take 1 capsule (200 mg total) 2 (two) times daily by mouth.    Dispense:  60 capsule    Refill:  0    Do not place medication on "Automatic Refill". Fill one day early if pharmacy is closed on scheduled refill date.    Order Specific Question:   Supervising Provider    Answer:   Milinda Pointer [[id number]]  This SmartLink is deprecated. Use AVSMEDLIST instead to display the medication list for a patient. Medications  administered today: Kharson Rasmusson. Rosendahl had no medications administered during this visit. Lab-work, procedure(s), and/or referral(s): Orders Placed This Encounter  Procedures  . ToxASSURE Select 13 (MW), Urine   Imaging and/or referral(s): None  Interventional therapies: Planned, scheduled, and/or pending:   Not at this time.   Considering:   Removal of nonfunctional spinal cord stimulatorfollowed by an MRI of the lumbar spine with and without contrast. Diagnostic bilateral lumbar facet block Possible bilateral lumbar facet RFA Diagnostic caudal epidural steroid injection plus diagnostic epidurogram Possible Racz procedure   PRN Procedures:   None at this time   Provider-requested follow-up: Return in about 4 weeks (around 02/27/2017) for MedMgmt.  Future Appointments  Date Time Provider [place]  02/27/2017  8:45 AM Vevelyn Francois, NP [facility] None   Primary Care Physician: Hoyt Koch, MD Location: [facility] Note by: Vevelyn Francois NP Date: 01/30/2017; Time: 1:00 PM  Pain Score Disclaimer: We use the NRS-11 scale. This is a self-reported, subjective measurement of pain severity with only modest accuracy. It is used primarily to identify changes within a particular patient. It must be understood that outpatient pain scales are significantly less accurate that those used for research, where they can be applied under ideal controlled circumstances with minimal exposure to variables. In reality, the score is likely to be a combination of pain intensity and pain affect, where pain affect describes the degree of emotional arousal or changes in action readiness caused by the sensory experience of pain. Factors such as social and work situation, setting, emotional state, anxiety levels, expectation,  and prior pain experience may influence pain perception and show large inter-individual differences that may also be affected by time  variables.  Patient instructions provided during this appointment: Patient Instructions    _You have been given 1 script of Morphine 15 mg and 1 script for Morphine 30 mg_________________________________________________________________________________________  Medication Rules  Applies to: All patients receiving prescriptions (written or electronic).  Pharmacy of record: Pharmacy where electronic prescriptions will be sent. If written prescriptions are taken to a different pharmacy, please inform the nursing staff. The pharmacy listed in the electronic medical record should be the one where you would like electronic prescriptions to be sent.  Prescription refills: Only during scheduled appointments. Applies to both, written and electronic prescriptions.  NOTE: The following applies primarily to controlled substances (Opioid* Pain Medications).   Patient's responsibilities: 1. Pain Pills: Bring all pain pills to every appointment (except for procedure appointments). 2. Pill Bottles: Bring pills in original pharmacy bottle. Always bring newest bottle. Bring bottle, even if empty. 3. Medication refills: You are responsible for knowing and keeping track of what medications you need refilled. The day before your appointment, write a list of all prescriptions that need to be refilled. Bring that list to your appointment and give it to the admitting nurse. Prescriptions will be written only during appointments. If you forget a medication, it will not be "Called in", "Faxed", or "electronically sent". You will need to get another appointment to get these prescribed. 4. Prescription Accuracy: You are responsible for carefully inspecting your prescriptions before leaving our office. Have the discharge nurse carefully go over each prescription with you, before taking them home. Make sure that your name is accurately spelled, that your address is correct. Check the name and dose of your medication to make  sure it is accurate. Check the number of pills, and the written instructions to make sure they are clear and accurate. Make sure that you are given enough medication to last until your next medication refill appointment. 5. Taking Medication: Take medication as prescribed. Never take more pills than instructed. Never take medication more frequently than prescribed. Taking less pills or less frequently is permitted and encouraged, when it comes to controlled substances (written prescriptions).  6. Inform other Doctors: Always inform, all of your healthcare providers, of all the medications you take. 7. Pain Medication from other Providers: You are not allowed to accept any additional pain medication from any other Doctor or Healthcare provider. There are two exceptions to this rule. (see below) In the event that you require additional pain medication, you are responsible for notifying us, as stated below. 8. Medication Agreement: You are responsible for carefully reading and following our Medication Agreement. This must be signed before receiving any prescriptions from our practice. Safely store a copy of your signed Agreement. Violations to the Agreement will result in no further prescriptions. (Additional copies of our Medication Agreement are available upon request.) 9. Laws, Rules, & Regulations: All patients are expected to follow all Federal and [employer], [employer], Rules, [facility]. Ignorance of the Laws does not constitute a valid excuse. The use of any illegal substances is prohibited. 10. Adopted CDC guidelines & recommendations: Target dosing levels will be at or below 60 MME/day. Use of benzodiazepines** is not recommended.  Exceptions: There are only two exceptions to the rule of not receiving pain medications from other Healthcare Providers. 1. Exception #1 (Emergencies): In the event of an emergency (i.e.: accident requiring emergency care), you are allowed to receive  additional pain  medication. However, you are responsible for: As soon as you are able, call our office [phone], at any time of the day or night, and leave a message stating your name, the date and nature of the emergency, and the name and dose of the medication prescribed. In the event that your call is answered by a member of our staff, make sure to document and save the date, time, and the name of the person that took your information.  2. Exception #2 (Planned Surgery): In the event that you are scheduled by another doctor or dentist to have any type of surgery or procedure, you are allowed (for a period no longer than 30 days), to receive additional pain medication, for the acute post-op pain. However, in this case, you are responsible for picking up a copy of our "Post-op Pain Management for Surgeons" handout, and giving it to your surgeon or dentist. This document is available at our office, and does not require an appointment to obtain it. Simply go to our office during business hours (Monday-Thursday from 8:00 AM to 4:00 PM) (Friday 8:00 AM to 12:00 Noon) or if you have a scheduled appointment with [place], prior to your surgery, and ask for it by name. In addition, you will need to provide [place] with your name, name of your surgeon, type of surgery, and date of procedure or surgery.  *Opioid medications include: morphine, codeine, oxycodone, oxymorphone, hydrocodone, hydromorphone, meperidine, tramadol, tapentadol, buprenorphine, fentanyl, methadone. **Benzodiazepine medications include: diazepam (Valium), alprazolam (Xanax), clonazepam (Klonopine), lorazepam (Ativan), clorazepate (Tranxene), chlordiazepoxide (Librium), estazolam (Prosom), oxazepam (Serax), temazepam (Restoril), triazolam (Halcion)  ____________________________________________________________________________________________  BMI Assessment: Estimated body mass index is 27.26 kg/m as calculated from the following:   Height as of this encounter: 5'  10" (1.778 m).   Weight as of this encounter: 190 lb (86.2 kg).  BMI interpretation table: BMI level Category Range association with higher incidence of chronic pain  <18 kg/m2 Underweight   18.5-24.9 kg/m2 Ideal body weight   25-29.9 kg/m2 Overweight Increased incidence by 20%  30-34.9 kg/m2 Obese (Class I) Increased incidence by 68%  35-39.9 kg/m2 Severe obesity (Class II) Increased incidence by 136%  >40 kg/m2 Extreme obesity (Class III) Increased incidence by 254%   BMI Readings from Last 4 Encounters:  01/30/17 27.26 kg/m  01/19/17 27.26 kg/m  01/09/17 27.26 kg/m  12/12/16 27.55 kg/m   Wt Readings from Last 4 Encounters:  01/30/17 190 lb (86.2 kg)  01/19/17 190 lb (86.2 kg)  01/09/17 190 lb (86.2 kg)  12/12/16 192 lb (87.1 kg)

## 2017-01-30 NOTE — Patient Instructions (Addendum)
_You have been given 1 script of Morphine 15 mg and 1 script for Morphine 30 mg_________________________________________________________________________________________  Medication Rules  Applies to: All patients receiving prescriptions (written or electronic).  Pharmacy of record: Pharmacy where electronic prescriptions will be sent. If written prescriptions are taken to a different pharmacy, please inform the nursing staff. The pharmacy listed in the electronic medical record should be the one where you would like electronic prescriptions to be sent.  Prescription refills: Only during scheduled appointments. Applies to both, written and electronic prescriptions.  NOTE: The following applies primarily to controlled substances (Opioid* Pain Medications).   Patient's responsibilities: 1. Pain Pills: Bring all pain pills to every appointment (except for procedure appointments). 2. Pill Bottles: Bring pills in original pharmacy bottle. Always bring newest bottle. Bring bottle, even if empty. 3. Medication refills: You are responsible for knowing and keeping track of what medications you need refilled. The day before your appointment, write a list of all prescriptions that need to be refilled. Bring that list to your appointment and give it to the admitting nurse. Prescriptions will be written only during appointments. If you forget a medication, it will not be "Called in", "Faxed", or "electronically sent". You will need to get another appointment to get these prescribed. 4. Prescription Accuracy: You are responsible for carefully inspecting your prescriptions before leaving our office. Have the discharge nurse carefully go over each prescription with you, before taking them home. Make sure that your name is accurately spelled, that your address is correct. Check the name and dose of your medication to make sure it is accurate. Check the number of pills, and the written instructions to make sure they  are clear and accurate. Make sure that you are given enough medication to last until your next medication refill appointment. 5. Taking Medication: Take medication as prescribed. Never take more pills than instructed. Never take medication more frequently than prescribed. Taking less pills or less frequently is permitted and encouraged, when it comes to controlled substances (written prescriptions).  6. Inform other Doctors: Always inform, all of your healthcare providers, of all the medications you take. 7. Pain Medication from other Providers: You are not allowed to accept any additional pain medication from any other Doctor or Healthcare provider. There are two exceptions to this rule. (see below) In the event that you require additional pain medication, you are responsible for notifying us, as stated below. 8. Medication Agreement: You are responsible for carefully reading and following our Medication Agreement. This must be signed before receiving any prescriptions from our practice. Safely store a copy of your signed Agreement. Violations to the Agreement will result in no further prescriptions. (Additional copies of our Medication Agreement are available upon request.) 9. Laws, Rules, & Regulations: All patients are expected to follow all [address] and [employer] Laws, [employer], Rules, [place]& Regulations. Ignorance of the Laws does not constitute a valid excuse. The use of any illegal substances is prohibited. 10. Adopted CDC guidelines & recommendations: Target dosing levels will be at or below 60 MME/day. Use of benzodiazepines** is not recommended.  Exceptions: There are only two exceptions to the rule of not receiving pain medications from other Healthcare Providers. 1. Exception #1 (Emergencies): In the event of an emergency (i.e.: accident requiring emergency care), you are allowed to receive additional pain medication. However, you are responsible for: As soon as you are able, call our office [phone]  [phone], at any time of the day or night, and leave a message stating your  name, the date and nature of the emergency, and the name and dose of the medication prescribed. In the event that your call is answered by a member of our staff, make sure to document and save the date, time, and the name of the person that took your information.  2. Exception #2 (Planned Surgery): In the event that you are scheduled by another doctor or dentist to have any type of surgery or procedure, you are allowed (for a period no longer than 30 days), to receive additional pain medication, for the acute post-op pain. However, in this case, you are responsible for picking up a copy of our "Post-op Pain Management for Surgeons" handout, and giving it to your surgeon or dentist. This document is available at our office, and does not require an appointment to obtain it. Simply go to our office during business hours (Monday-Thursday from 8:00 AM to 4:00 PM) (Friday 8:00 AM to 12:00 Noon) or if you have a scheduled appointment with [place], prior to your surgery, and ask for it by name. In addition, you will need to provide [place] with your name, name of your surgeon, type of surgery, and date of procedure or surgery.  *Opioid medications include: morphine, codeine, oxycodone, oxymorphone, hydrocodone, hydromorphone, meperidine, tramadol, tapentadol, buprenorphine, fentanyl, methadone. **Benzodiazepine medications include: diazepam (Valium), alprazolam (Xanax), clonazepam (Klonopine), lorazepam (Ativan), clorazepate (Tranxene), chlordiazepoxide (Librium), estazolam (Prosom), oxazepam (Serax), temazepam (Restoril), triazolam (Halcion)  ____________________________________________________________________________________________  BMI Assessment: Estimated body mass index is 27.26 kg/m as calculated from the following:   Height as of this encounter: 5\' 10"  (1.778 m).   Weight as of this encounter: 190 lb (86.2 kg).  BMI interpretation  table: BMI level Category Range association with higher incidence of chronic pain  <18 kg/m2 Underweight   18.5-24.9 kg/m2 Ideal body weight   25-29.9 kg/m2 Overweight Increased incidence by 20%  30-34.9 kg/m2 Obese (Class I) Increased incidence by 68%  35-39.9 kg/m2 Severe obesity (Class II) Increased incidence by 136%  >40 kg/m2 Extreme obesity (Class III) Increased incidence by 254%   BMI Readings from Last 4 Encounters:  01/30/17 27.26 kg/m  01/19/17 27.26 kg/m  01/09/17 27.26 kg/m  12/12/16 27.55 kg/m   Wt Readings from Last 4 Encounters:  01/30/17 190 lb (86.2 kg)  01/19/17 190 lb (86.2 kg)  01/09/17 190 lb (86.2 kg)  12/12/16 192 lb (87.1 kg)

## 2017-01-31 ENCOUNTER — Ambulatory Visit: Payer: Worker's Compensation

## 2017-02-03 LAB — TOXASSURE SELECT 13 (MW), URINE

## 2017-02-27 ENCOUNTER — Ambulatory Visit: Payer: Worker's Compensation | Attending: Nurse Practitioner | Admitting: Nurse Practitioner

## 2017-02-27 ENCOUNTER — Encounter: Payer: Self-pay | Admitting: Nurse Practitioner

## 2017-02-27 ENCOUNTER — Other Ambulatory Visit: Payer: Self-pay

## 2017-02-27 VITALS — BP 120/72 | HR 59 | Temp 98.9°F | Resp 16 | Ht 70.0 in | Wt 190.0 lb

## 2017-02-27 DIAGNOSIS — F17220 Nicotine dependence, chewing tobacco, uncomplicated: Secondary | ICD-10-CM | POA: Insufficient documentation

## 2017-02-27 DIAGNOSIS — E785 Hyperlipidemia, unspecified: Secondary | ICD-10-CM | POA: Insufficient documentation

## 2017-02-27 DIAGNOSIS — M79605 Pain in left leg: Secondary | ICD-10-CM | POA: Diagnosis not present

## 2017-02-27 DIAGNOSIS — M5442 Lumbago with sciatica, left side: Secondary | ICD-10-CM | POA: Diagnosis not present

## 2017-02-27 DIAGNOSIS — Z5181 Encounter for therapeutic drug level monitoring: Secondary | ICD-10-CM | POA: Diagnosis present

## 2017-02-27 DIAGNOSIS — G894 Chronic pain syndrome: Secondary | ICD-10-CM | POA: Diagnosis not present

## 2017-02-27 DIAGNOSIS — Z79891 Long term (current) use of opiate analgesic: Secondary | ICD-10-CM | POA: Diagnosis not present

## 2017-02-27 DIAGNOSIS — M5410 Radiculopathy, site unspecified: Secondary | ICD-10-CM | POA: Diagnosis not present

## 2017-02-27 DIAGNOSIS — M5137 Other intervertebral disc degeneration, lumbosacral region: Secondary | ICD-10-CM | POA: Insufficient documentation

## 2017-02-27 DIAGNOSIS — G8929 Other chronic pain: Secondary | ICD-10-CM

## 2017-02-27 DIAGNOSIS — K219 Gastro-esophageal reflux disease without esophagitis: Secondary | ICD-10-CM

## 2017-02-27 DIAGNOSIS — M47816 Spondylosis without myelopathy or radiculopathy, lumbar region: Secondary | ICD-10-CM | POA: Diagnosis not present

## 2017-02-27 MED ORDER — CELECOXIB 200 MG PO CAPS: 200.0000 mg | ORAL_CAPSULE | Freq: Two times a day (BID) | ORAL | 0 refills | Status: DC

## 2017-02-27 MED ORDER — MORPHINE SULFATE ER 30 MG PO TBCR: 30.0000 mg | EXTENDED_RELEASE_TABLET | Freq: Three times a day (TID) | ORAL | 0 refills | Status: DC

## 2017-02-27 MED ORDER — ESOMEPRAZOLE MAGNESIUM 40 MG PO CPDR: 40.0000 mg | DELAYED_RELEASE_CAPSULE | Freq: Two times a day (BID) | ORAL | 0 refills | Status: DC

## 2017-02-27 MED ORDER — MORPHINE SULFATE 15 MG PO TABS: 15.0000 mg | ORAL_TABLET | Freq: Four times a day (QID) | ORAL | 0 refills | Status: DC | PRN

## 2017-02-27 NOTE — Progress Notes (Signed)
Safety precautions to be maintained throughout the outpatient stay will include: orient to surroundings, keep bed in low position, maintain call bell within reach at all times, provide assistance with transfer out of bed and ambulation.  

## 2017-02-27 NOTE — Progress Notes (Signed)
Patient's Name: Lance Roman  MRN: [id number]  Referring Provider: Hoyt Koch, *  DOB: [birth year]  PCP: Hoyt Koch, MD  DOS: 02/27/2017  Note by: Vevelyn Francois NP  Service setting: Ambulatory outpatient  Specialty: Interventional Pain Management  Location: [facility] (AMB) Pain Management Facility    Patient type: Established    Primary Reason(s) for Visit: Encounter for prescription drug management. (Level of risk: moderate)  CC: Back Pain (low back down the left leg.)  HPI  [name] is a 55 y.o. year old, male patient, who comes today for a medication management evaluation. He has GERD; Chronic low back pain (Primary Area of Pain) (Bilateral) (L>R); Pharmacologic therapy; Infected sebaceous cyst; Disorder of skeletal system; Problems influencing health status; Opiate use (150 MME/day); Long term prescription opiate use; Chronic pain syndrome; Muscle cramps; Long-term current use of opiate analgesic; Chronic lower extremity pain (Secondary Area of Pain) (Left); Chronic lumbar radicular pain (L5) (Left); Retrolisthesis (4-5 mm) (L3 over L4) (L2 over L3; Failed back surgical syndrome (Fusion L4-S1); Lumbar facet hypertrophy (Bilateral); Lumbar facet syndrome (Bilateral) (L>R); Spinal cord stimulator dysfunction (fractured leads); Lumbar facet osteoarthritis (Bilateral); and Osteoarthritis of lumbar spine on their problem list. His primarily concern today is the Back Pain (low back down the left leg.)  Pain Assessment: Location:   Back Radiating: left leg to the heel Onset: More than a month ago Duration: Chronic pain Quality: Aching Severity: 2 /10 (self-reported pain score)  Note: Reported level is compatible with observation.                          Effect on ADL: pace self Timing: Intermittent Modifying factors: medications and moving around.  [name] was last scheduled for an appointment on 01/30/2017 for medication management. During today's appointment we  reviewed [name]'s chronic pain status, as well as his outpatient medication regimen. He states that he did follow up with Dr Lacinda Axon. He states that he was encouraged to have an CTscan with resolution. He states that he would call to have this completed. He states that his pain is stable. He has occasional numbness and tingling with increased activity. He does try to work daily.   The patient  reports that he does not use drugs. His body mass index is 27.26 kg/m.  Further details on both, my assessment(s), as well as the proposed treatment plan, please see below.  Controlled Substance Pharmacotherapy Assessment REMS (Risk Evaluation and Mitigation Strategy)  Analgesic:Morphine ER 30 mg every 8 hours + morphine IR 15 mg every 6 hours (150MME/day) MME/day: 141m/day  Wheatley, Dena L, RN  02/27/2017  9:55 AM  Sign at close encounter Nursing Pain Medication Assessment:  Safety precautions to be maintained throughout the outpatient stay will include: orient to surroundings, keep bed in low position, maintain call bell within reach at all times, provide assistance with transfer out of bed and ambulation.  Medication Inspection Compliance: Pill count conducted under aseptic conditions, in front of the patient. Neither the pills nor the bottle was removed from the patient's sight at any time. Once count was completed pills were immediately returned to the patient in their original bottle.  Medication #1: Morphine IR Pill/Patch Count: 65 of 120 pills remain Pill/Patch Appearance: Markings consistent with prescribed medication Bottle Appearance: Standard pharmacy container. Clearly labeled. Filled Date:11/05/ 2018 Last Medication intake:  Today  Medication #2: Morphine ER (MSContin) Pill/Patch Count: 51 of 90 pills remain Pill/Patch Appearance:  Markings consistent with prescribed medication Bottle Appearance: Standard pharmacy container. Clearly labeled. Filled Date:11/05/ 2018 Last  Medication intake:  Today  Charna Busman, NT  02/27/2017  9:21 AM  Sign at close encounter Safety precautions to be maintained throughout the outpatient stay will include: orient to surroundings, keep bed in low position, maintain call bell within reach at all times, provide assistance with transfer out of bed and ambulation.    Pharmacokinetics: Liberation and absorption (onset of action): WNL Distribution (time to peak effect): WNL Metabolism and excretion (duration of action): WNL         Pharmacodynamics: Desired effects: Analgesia: [name] reports >50% benefit. Functional ability: Patient reports that medication allows him to accomplish basic ADLs Clinically meaningful improvement in function (CMIF): Sustained CMIF goals met Perceived effectiveness: Described as relatively effective, allowing for increase in activities of daily living (ADL) Undesirable effects: Side-effects or Adverse reactions: None reported Monitoring:  PMP: Online review of the past 81-monthperiod conducted. Compliant with practice rules and regulations Last UDS on record: Summary  Date Value Ref Range Status  01/30/2017 FINAL  Final    Comment:    ==================================================================== TOXASSURE SELECT 13 (MW) ==================================================================== Test                             Result       Flag       Units Drug Present and Declared for Prescription Verification   Morphine                       >20000       EXPECTED   ng/mg creat   Normorphine                    1152         EXPECTED   ng/mg creat    Potential sources of large amounts of morphine in the absence of    codeine include administration of morphine or use of heroin.    Normorphine is an expected metabolite of morphine.   Hydromorphone                  318          EXPECTED   ng/mg creat    Hydromorphone may be present as a metabolite of morphine;    concentrations of  hydromorphone rarely exceed 5% of the morphine    concentration when this is the source of hydromorphone. ==================================================================== Test                      Result    Flag   Units      Ref Range   Creatinine              50               mg/dL      >=20 ==================================================================== Declared Medications:  The flagging and interpretation on this report are based on the  following declared medications.  Unexpected results may arise from  inaccuracies in the declared medications.  **Note: The testing scope of this panel includes these medications:  Morphine (MS Contin)  Morphine (MSIR)  **Note: The testing scope of this panel does not include following  reported medications:  Celecoxib (Celebrex)  Multivitamin  Olopatadine (patanol)  Omeprazole (Nexium) ==================================================================== For clinical consultation, please call ([phone] ====================================================================    UDS interpretation: Compliant  Medication Assessment Form: Reviewed. Patient indicates being compliant with therapy Treatment compliance: Compliant Risk Assessment Profile: Aberrant behavior: See prior evaluations. None observed or detected today Comorbid factors increasing risk of overdose: See prior notes. No additional risks detected today Risk of substance use disorder (SUD): Low Opioid Risk Tool - 01/19/17 0802      Family History of Substance Abuse   Alcohol  Negative    Illegal Drugs  Negative    Rx Drugs  Negative      Personal History of Substance Abuse   Alcohol  Negative    Illegal Drugs  Negative    Rx Drugs  Negative      Age   Age between 2-45 years   No      History of Preadolescent Sexual Abuse   History of Preadolescent Sexual Abuse  Negative or Male      Psychological Disease   Psychological Disease  Negative     Depression  Negative      Total Score   Opioid Risk Tool Scoring  0    Opioid Risk Interpretation  Low Risk      ORT Scoring interpretation table:  Score <3 = Low Risk for SUD  Score between 4-7 = Moderate Risk for SUD  Score >8 = High Risk for Opioid Abuse   Risk Mitigation Strategies:  Patient Counseling: Covered Patient-Prescriber Agreement (PPA): Present and active  Notification to other healthcare providers: Done  Pharmacologic Plan: No change in therapy, at this time  Laboratory Chemistry  Inflammation Markers (CRP: Acute Phase) (ESR: Chronic Phase) Lab Results  Component Value Date   CRP 2.5 12/12/2016   ESRSEDRATE 10 12/12/2016                 Rheumatology Markers Lab Results  Component Value Date   LABURIC 6.4 08/11/2008                Renal Function Markers Lab Results  Component Value Date   BUN 20 12/12/2016   CREATININE 0.74 (L) 12/12/2016   GFRAA 120 12/12/2016   GFRNONAA 104 12/12/2016                 Hepatic Function Markers Lab Results  Component Value Date   AST 21 12/12/2016   ALT 12 12/30/2014   ALBUMIN 4.5 12/12/2016   ALKPHOS 66 12/12/2016   HCVAB NEGATIVE 12/30/2014                 Electrolytes Lab Results  Component Value Date   NA 140 12/12/2016   K 4.5 12/12/2016   CL 99 12/12/2016   CALCIUM 9.7 12/12/2016   MG 1.9 12/12/2016                 Neuropathy Markers Lab Results  Component Value Date   VITAMINB12 632 12/12/2016                 Bone Pathology Markers Lab Results  Component Value Date   25OHVITD1 44 12/12/2016   25OHVITD2 <1.0 12/12/2016   25OHVITD3 43 12/12/2016                 Coagulation Parameters Lab Results  Component Value Date   PLT 167.0 03/03/2011                 Cardiovascular Markers Lab Results  Component Value Date   HGB 13.1 03/03/2011   HCT 38.5 (L) 03/03/2011  CA Markers No results found for: CEA, CA125, LABCA2               Note: Lab results  reviewed.  Recent Diagnostic Imaging Results  DG C-Arm 1-60 Min-No Report Fluoroscopy was utilized by the requesting physician.  No radiographic  interpretation.   Complexity Note: Imaging results reviewed. Results shared with Mr. Holmes, using Layman's terms.                         Meds   Current Outpatient Medications:  .  celecoxib (CELEBREX) 200 MG capsule, Take 1 capsule (200 mg total) by mouth 2 (two) times daily., Disp: 180 capsule, Rfl: 0 .  esomeprazole (NEXIUM) 40 MG capsule, Take 1 capsule (40 mg total) by mouth 2 (two) times daily before a meal., Disp: 180 capsule, Rfl: 0 .  [START ON 03/10/2017] morphine (MS CONTIN) 30 MG 12 hr tablet, Take 1 tablet (30 mg total) by mouth every 8 (eight) hours., Disp: 90 tablet, Rfl: 0 .  [START ON 03/10/2017] morphine (MSIR) 15 MG tablet, Take 1 tablet (15 mg total) by mouth every 6 (six) hours as needed for severe pain., Disp: 120 tablet, Rfl: 0 .  Multiple Vitamin (MULTIVITAMIN) capsule, Take 1 capsule by mouth daily., Disp: , Rfl:  .  olopatadine (PATANOL) 0.1 % ophthalmic solution, Place 1 drop into both eyes as needed. , Disp: , Rfl:   ROS  Constitutional: Denies any fever or chills Gastrointestinal: No reported hemesis, hematochezia, vomiting, or acute GI distress Musculoskeletal: Denies any acute onset joint swelling, redness, loss of ROM, or weakness Neurological: No reported episodes of acute onset apraxia, aphasia, dysarthria, agnosia, amnesia, paralysis, loss of coordination, or loss of consciousness  Allergies  Mr. Heckmann has No Known Allergies.  [place]  Drug: Mr. Delpilar  reports that he does not use drugs. Alcohol:  reports that he drinks about 1.8 oz of alcohol per week. Tobacco:  reports that he has quit smoking. His smokeless tobacco use includes chew. Medical:  has a past medical history of Allergic rhinitis, Chronic pain syndrome, DDD (degenerative disc disease), lumbosacral, GERD (gastroesophageal reflux disease),  Hyperlipidemia, Osteoarthritis, and S/P appendectomy. Surgical: Mr. Vilar  has a past surgical history that includes Appendectomy; repairs of bilateral ankle fractures; Spinal fusion (N/A); and Spinal cord stimulator implant (N/A). Family: family history includes Diabetes in his maternal grandmother; Heart disease in his father.  Constitutional Exam  General appearance: Well nourished, well developed, and well hydrated. In no apparent acute distress Vitals:   02/27/17 0913  BP: 120/72  Pulse: (!) 59  Resp: 16  Temp: 98.9 F (37.2 C)  TempSrc: Oral  SpO2: 100%  Weight: 190 lb (86.2 kg)  Height: 5' 10"  (1.778 m)   BMI Assessment: Estimated body mass index is 27.26 kg/m as calculated from the following:   Height as of this encounter: 5' 10"  (1.778 m).   Weight as of this encounter: 190 lb (86.2 kg). Psych/Mental status: Alert, oriented x 3 (person, place, & time)       Eyes: PERLA Respiratory: No evidence of acute respiratory distress  Lumbar Spine Area Exam  Skin & Axial Inspection: Well healed scar from previous spine surgery detected Alignment: Symmetrical Functional ROM: Unrestricted ROM      Stability: No instability detected Muscle Tone/Strength: Functionally intact. No obvious neuro-muscular anomalies detected. Sensory (Neurological): Unimpaired Palpation: No palpable anomalies       Provocative Tests: Lumbar Hyperextension and rotation test: evaluation  deferred today       Lumbar Lateral bending test: evaluation deferred today       Patrick's Maneuver: evaluation deferred today                    Gait & Posture Assessment  Ambulation: Unassisted Gait: Relatively normal for age and body habitus Posture: WNL   Lower Extremity Exam    Side: Right lower extremity  Side: Left lower extremity  Skin & Extremity Inspection: Skin color, temperature, and hair growth are WNL. No peripheral edema or cyanosis. No masses, redness, swelling, asymmetry, or associated skin lesions.  No contractures.  Skin & Extremity Inspection: Skin color, temperature, and hair growth are WNL. No peripheral edema or cyanosis. No masses, redness, swelling, asymmetry, or associated skin lesions. No contractures.  Functional ROM: Unrestricted ROM          Functional ROM: Unrestricted ROM          Muscle Tone/Strength: Functionally intact. No obvious neuro-muscular anomalies detected.  Muscle Tone/Strength: Functionally intact. No obvious neuro-muscular anomalies detected.  Sensory (Neurological): Unimpaired  Sensory (Neurological): Unimpaired  Palpation: No palpable anomalies  Palpation: No palpable anomalies   Assessment  Primary Diagnosis & Pertinent Problem List: The primary encounter diagnosis was Chronic low back pain (Primary Area of Pain) (Bilateral) (L>R). Diagnoses of Chronic lower extremity pain (Secondary Area of Pain) (Left), Chronic lumbar radicular pain (L5) (Left), Chronic pain syndrome, Gastroesophageal reflux disease without esophagitis, Lumbar facet osteoarthritis (Bilateral), and Osteoarthritis of lumbar spine were also pertinent to this visit.  Status Diagnosis  Controlled Controlled Controlled 1. Chronic low back pain (Primary Area of Pain) (Bilateral) (L>R)   2. Chronic lower extremity pain (Secondary Area of Pain) (Left)   3. Chronic lumbar radicular pain (L5) (Left)   4. Chronic pain syndrome   5. Gastroesophageal reflux disease without esophagitis   6. Lumbar facet osteoarthritis (Bilateral)   7. Osteoarthritis of lumbar spine     Problems updated and reviewed during this visit: No problems updated. Plan of Care  Pharmacotherapy (Medications Ordered): Meds ordered this encounter  Medications  . morphine (MS CONTIN) 30 MG 12 hr tablet    Sig: Take 1 tablet (30 mg total) by mouth every 8 (eight) hours.    Dispense:  90 tablet    Refill:  0    Fill one day early if pharmacy is closed on scheduled refill date. Do not fill until: 03/10/2017 To last  until:04/09/2017    Order Specific Question:   Supervising Provider    Answer:   Milinda Pointer [phone]  . morphine (MSIR) 15 MG tablet    Sig: Take 1 tablet (15 mg total) by mouth every 6 (six) hours as needed for severe pain.    Dispense:  120 tablet    Refill:  0    Fill one day early if pharmacy is closed on scheduled refill date. Do not fill until: 03/10/2017 To last until: 04/09/2017    Order Specific Question:   Supervising Provider    Answer:   Milinda Pointer [phone]  . esomeprazole (NEXIUM) 40 MG capsule    Sig: Take 1 capsule (40 mg total) by mouth 2 (two) times daily before a meal.    Dispense:  180 capsule    Refill:  0    Do not place medication on "Automatic Refill". Fill one day early if pharmacy is closed on scheduled refill date.    Order Specific Question:   Supervising Provider  AnswerMilinda Pointer [[id number]]  . celecoxib (CELEBREX) 200 MG capsule    Sig: Take 1 capsule (200 mg total) by mouth 2 (two) times daily.    Dispense:  180 capsule    Refill:  0    Do not place medication on "Automatic Refill". Fill one day early if pharmacy is closed on scheduled refill date.    Order Specific Question:   Supervising Provider    Answer:   Milinda Pointer [[id number]]  This SmartLink is deprecated. Use AVSMEDLIST instead to display the medication list for a patient. Medications administered today: Cha Gomillion. Arismendez had no medications administered during this visit. Lab-work, procedure(s), and/or referral(s): No orders of the defined types were placed in this encounter.  Imaging and/or referral(s): None  Interventional therapies: Planned, scheduled, and/or pending:   Not at this time.   Considering: Removal of nonfunctional spinal cord stimulatorfollowed by an MRI of the lumbar spine with and without contrast. Diagnostic bilateral lumbar facet block Possible bilateral lumbar facet RFA Diagnostic caudal epidural steroid injection plus diagnostic  epidurogram Possible Racz procedure   PRN Procedures: None at this time   Provider-requested follow-up: Return in 4 weeks (on 03/30/2017).  Future Appointments  Date Time Provider [place]  03/30/2017  8:45 AM Vevelyn Francois, NP [facility] None   Primary Care Physician: Hoyt Koch, MD Location: [facility] Outpatient [facility] Management Facility Note by: Vevelyn Francois NP Date: 02/27/2017; Time: 11:06 AM  Pain Score Disclaimer: We use the NRS-11 scale. This is a self-reported, subjective measurement of pain severity with only modest accuracy. It is used primarily to identify changes within a particular patient. It must be understood that outpatient pain scales are significantly less accurate that those used for research, where they can be applied under ideal controlled circumstances with minimal exposure to variables. In reality, the score is likely to be a combination of pain intensity and pain affect, where pain affect describes the degree of emotional arousal or changes in action readiness caused by the sensory experience of pain. Factors such as social and work situation, setting, emotional state, anxiety levels, expectation, and prior pain experience may influence pain perception and show large inter-individual differences that may also be affected by time variables.  Patient instructions provided during this appointment: Patient Instructions   ____________________________________________________________________________________________  Medication Rules  Applies to: All patients receiving prescriptions (written or electronic).  Pharmacy of record: Pharmacy where electronic prescriptions will be sent. If written prescriptions are taken to a different pharmacy, please inform the nursing staff. The pharmacy listed in the electronic medical record should be the one where you would like electronic prescriptions to be sent.  Prescription refills: Only during scheduled  appointments. Applies to both, written and electronic prescriptions.  NOTE: The following applies primarily to controlled substances (Opioid* Pain Medications).   Patient's responsibilities: 1. Pain Pills: Bring all pain pills to every appointment (except for procedure appointments). 2. Pill Bottles: Bring pills in original pharmacy bottle. Always bring newest bottle. Bring bottle, even if empty. 3. Medication refills: You are responsible for knowing and keeping track of what medications you need refilled. The day before your appointment, write a list of all prescriptions that need to be refilled. Bring that list to your appointment and give it to the admitting nurse. Prescriptions will be written only during appointments. If you forget a medication, it will not be "Called in", "Faxed", or "electronically sent". You will need to get another appointment to get these prescribed. 4.  Prescription Accuracy: You are responsible for carefully inspecting your prescriptions before leaving our office. Have the discharge nurse carefully go over each prescription with you, before taking them home. Make sure that your name is accurately spelled, that your address is correct. Check the name and dose of your medication to make sure it is accurate. Check the number of pills, and the written instructions to make sure they are clear and accurate. Make sure that you are given enough medication to last until your next medication refill appointment. 5. Taking Medication: Take medication as prescribed. Never take more pills than instructed. Never take medication more frequently than prescribed. Taking less pills or less frequently is permitted and encouraged, when it comes to controlled substances (written prescriptions).  6. Inform other Doctors: Always inform, all of your healthcare providers, of all the medications you take. 7. Pain Medication from other Providers: You are not allowed to accept any additional pain  medication from any other Doctor or Healthcare provider. There are two exceptions to this rule. (see below) In the event that you require additional pain medication, you are responsible for notifying us, as stated below. 8. Medication Agreement: You are responsible for carefully reading and following our Medication Agreement. This must be signed before receiving any prescriptions from our practice. Safely store a copy of your signed Agreement. Violations to the Agreement will result in no further prescriptions. (Additional copies of our Medication Agreement are available upon request.) 9. Laws, Rules, & Regulations: All patients are expected to follow all Federal and [employer], Rules, [employer]. Ignorance of the Laws does not constitute a valid excuse. The use of any illegal substances is prohibited. 10. Adopted CDC guidelines & recommendations: Target dosing levels will be at or below 60 MME/day. Use of benzodiazepines** is not recommended.  Exceptions: There are only two exceptions to the rule of not receiving pain medications from other Healthcare Providers. 1. Exception #1 (Emergencies): In the event of an emergency (i.e.: accident requiring emergency care), you are allowed to receive additional pain medication. However, you are responsible for: As soon as you are able, call our office [phone], at any time of the day or night, and leave a message stating your name, the date and nature of the emergency, and the name and dose of the medication prescribed. In the event that your call is answered by a member of our staff, make sure to document and save the date, time, and the name of the person that took your information.  2. Exception #2 (Planned Surgery): In the event that you are scheduled by another doctor or dentist to have any type of surgery or procedure, you are allowed (for a period no longer than 30 days), to receive additional pain medication, for the acute post-op pain.  However, in this case, you are responsible for picking up a copy of our "Post-op Pain Management for Surgeons" handout, and giving it to your surgeon or dentist. This document is available at our office, and does not require an appointment to obtain it. Simply go to our office during business hours (Monday-Thursday from 8:00 AM to 4:00 PM) (Friday 8:00 AM to 12:00 Noon) or if you have a scheduled appointment with [place], prior to your surgery, and ask for it by name. In addition, you will need to provide [place] with your name, name of your surgeon, type of surgery, and date of procedure or surgery.  *Opioid medications include: morphine, codeine, oxycodone, oxymorphone, hydrocodone, hydromorphone, meperidine, tramadol, tapentadol,  buprenorphine, fentanyl, methadone. **Benzodiazepine medications include: diazepam (Valium), alprazolam (Xanax), clonazepam (Klonopine), lorazepam (Ativan), clorazepate (Tranxene), chlordiazepoxide (Librium), estazolam (Prosom), oxazepam (Serax), temazepam (Restoril), triazolam (Halcion)  Prescriptions for Nexium and Celebrex were sent to your pharmacy. You were given 1 prescription each for Morphine IR and Morphine ER         ____________________________________________________________________________________________

## 2017-02-27 NOTE — Addendum Note (Signed)
Addended by: Barbette MerinoKING, CRYSTAL M on: 02/27/2017 11:16 AM   Modules accepted: Level of Service

## 2017-02-27 NOTE — Patient Instructions (Addendum)
____________________________________________________________________________________________  Medication Rules  Applies to: All patients receiving prescriptions (written or electronic).  Pharmacy of record: Pharmacy where electronic prescriptions will be sent. If written prescriptions are taken to a different pharmacy, please inform the nursing staff. The pharmacy listed in the electronic medical record should be the one where you would like electronic prescriptions to be sent.  Prescription refills: Only during scheduled appointments. Applies to both, written and electronic prescriptions.  NOTE: The following applies primarily to controlled substances (Opioid* Pain Medications).   Patient's responsibilities: 1. Pain Pills: Bring all pain pills to every appointment (except for procedure appointments). 2. Pill Bottles: Bring pills in original pharmacy bottle. Always bring newest bottle. Bring bottle, even if empty. 3. Medication refills: You are responsible for knowing and keeping track of what medications you need refilled. The day before your appointment, write a list of all prescriptions that need to be refilled. Bring that list to your appointment and give it to the admitting nurse. Prescriptions will be written only during appointments. If you forget a medication, it will not be "Called in", "Faxed", or "electronically sent". You will need to get another appointment to get these prescribed. 4. Prescription Accuracy: You are responsible for carefully inspecting your prescriptions before leaving our office. Have the discharge nurse carefully go over each prescription with you, before taking them home. Make sure that your name is accurately spelled, that your address is correct. Check the name and dose of your medication to make sure it is accurate. Check the number of pills, and the written instructions to make sure they are clear and accurate. Make sure that you are given enough medication to  last until your next medication refill appointment. 5. Taking Medication: Take medication as prescribed. Never take more pills than instructed. Never take medication more frequently than prescribed. Taking less pills or less frequently is permitted and encouraged, when it comes to controlled substances (written prescriptions).  6. Inform other Doctors: Always inform, all of your healthcare providers, of all the medications you take. 7. Pain Medication from other Providers: You are not allowed to accept any additional pain medication from any other Doctor or Healthcare provider. There are two exceptions to this rule. (see below) In the event that you require additional pain medication, you are responsible for notifying us, as stated below. 8. Medication Agreement: You are responsible for carefully reading and following our Medication Agreement. This must be signed before receiving any prescriptions from our practice. Safely store a copy of your signed Agreement. Violations to the Agreement will result in no further prescriptions. (Additional copies of our Medication Agreement are available upon request.) 9. Laws, Rules, & Regulations: All patients are expected to follow all Federal and State Laws, Statutes, Rules, & Regulations. Ignorance of the Laws does not constitute a valid excuse. The use of any illegal substances is prohibited. 10. Adopted CDC guidelines & recommendations: Target dosing levels will be at or below 60 MME/day. Use of benzodiazepines** is not recommended.  Exceptions: There are only two exceptions to the rule of not receiving pain medications from other Healthcare Providers. 1. Exception #1 (Emergencies): In the event of an emergency (i.e.: accident requiring emergency care), you are allowed to receive additional pain medication. However, you are responsible for: As soon as you are able, call our office [phone], at any time of the day or night, and leave a message stating your  name, the date and nature of the emergency, and the name and dose of the medication   prescribed. In the event that your call is answered by a member of our staff, make sure to document and save the date, time, and the name of the person that took your information.  2. Exception #2 (Planned Surgery): In the event that you are scheduled by another doctor or dentist to have any type of surgery or procedure, you are allowed (for a period no longer than 30 days), to receive additional pain medication, for the acute post-op pain. However, in this case, you are responsible for picking up a copy of our "Post-op Pain Management for Surgeons" handout, and giving it to your surgeon or dentist. This document is available at our office, and does not require an appointment to obtain it. Simply go to our office during business hours (Monday-Thursday from 8:00 AM to 4:00 PM) (Friday 8:00 AM to 12:00 Noon) or if you have a scheduled appointment with us, prior to your surgery, and ask for it by name. In addition, you will need to provide us with your name, name of your surgeon, type of surgery, and date of procedure or surgery.  *Opioid medications include: morphine, codeine, oxycodone, oxymorphone, hydrocodone, hydromorphone, meperidine, tramadol, tapentadol, buprenorphine, fentanyl, methadone. **Benzodiazepine medications include: diazepam (Valium), alprazolam (Xanax), clonazepam (Klonopine), lorazepam (Ativan), clorazepate (Tranxene), chlordiazepoxide (Librium), estazolam (Prosom), oxazepam (Serax), temazepam (Restoril), triazolam (Halcion)  Prescriptions for Nexium and Celebrex were sent to your pharmacy. You were given 1 prescription each for Morphine IR and Morphine ER         ____________________________________________________________________________________________

## 2017-02-27 NOTE — Progress Notes (Signed)
Nursing Pain Medication Assessment:  Safety precautions to be maintained throughout the outpatient stay will include: orient to surroundings, keep bed in low position, maintain call bell within reach at all times, provide assistance with transfer out of bed and ambulation.  Medication Inspection Compliance: Pill count conducted under aseptic conditions, in front of the patient. Neither the pills nor the bottle was removed from the patient's sight at any time. Once count was completed pills were immediately returned to the patient in their original bottle.  Medication #1: Morphine IR Pill/Patch Count: 65 of 120 pills remain Pill/Patch Appearance: Markings consistent with prescribed medication Bottle Appearance: Standard pharmacy container. Clearly labeled. Filled Date:11/05/ 2018 Last Medication intake:  Today  Medication #2: Morphine ER (MSContin) Pill/Patch Count: 51 of 90 pills remain Pill/Patch Appearance: Markings consistent with prescribed medication Bottle Appearance: Standard pharmacy container. Clearly labeled. Filled Date:11/05/ 2018 Last Medication intake:  Today

## 2017-03-09 ENCOUNTER — Other Ambulatory Visit: Payer: Self-pay | Admitting: Pain Medicine

## 2017-03-09 DIAGNOSIS — M47816 Spondylosis without myelopathy or radiculopathy, lumbar region: Secondary | ICD-10-CM

## 2017-03-13 ENCOUNTER — Other Ambulatory Visit: Payer: Self-pay | Admitting: Nurse Practitioner

## 2017-03-13 ENCOUNTER — Telehealth: Payer: Self-pay

## 2017-03-13 DIAGNOSIS — M47816 Spondylosis without myelopathy or radiculopathy, lumbar region: Secondary | ICD-10-CM

## 2017-03-13 MED ORDER — CELECOXIB 200 MG PO CAPS: 200.0000 mg | ORAL_CAPSULE | Freq: Two times a day (BID) | ORAL | 0 refills | Status: DC

## 2017-03-13 NOTE — Telephone Encounter (Signed)
Spoke with patient to let him know that his celebrex was re escribed today and receipt confirmed by pharmacy @ [phone].  Patient verbalizes u/o information

## 2017-03-13 NOTE — Telephone Encounter (Signed)
According to the MR it was both sent and received by Pharmacy. I will send in again Please make pt aware

## 2017-03-17 NOTE — Progress Notes (Signed)
Results were reviewed and found to be: mildly abnormal  No acute injury or pathology identified  Review would suggest interventional pain management techniques may be of benefit 

## 2017-03-30 ENCOUNTER — Encounter: Payer: Self-pay | Admitting: Nurse Practitioner

## 2017-03-30 ENCOUNTER — Ambulatory Visit: Payer: Worker's Compensation | Attending: Nurse Practitioner | Admitting: Nurse Practitioner

## 2017-03-30 ENCOUNTER — Other Ambulatory Visit: Payer: Self-pay

## 2017-03-30 VITALS — BP 128/73 | HR 60 | Temp 97.6°F | Ht 70.0 in | Wt 190.0 lb

## 2017-03-30 DIAGNOSIS — K219 Gastro-esophageal reflux disease without esophagitis: Secondary | ICD-10-CM | POA: Insufficient documentation

## 2017-03-30 DIAGNOSIS — M1990 Unspecified osteoarthritis, unspecified site: Secondary | ICD-10-CM | POA: Diagnosis not present

## 2017-03-30 DIAGNOSIS — Z5181 Encounter for therapeutic drug level monitoring: Secondary | ICD-10-CM | POA: Insufficient documentation

## 2017-03-30 DIAGNOSIS — M5442 Lumbago with sciatica, left side: Secondary | ICD-10-CM

## 2017-03-30 DIAGNOSIS — G8929 Other chronic pain: Secondary | ICD-10-CM

## 2017-03-30 DIAGNOSIS — M5137 Other intervertebral disc degeneration, lumbosacral region: Secondary | ICD-10-CM | POA: Insufficient documentation

## 2017-03-30 DIAGNOSIS — M79605 Pain in left leg: Secondary | ICD-10-CM | POA: Diagnosis not present

## 2017-03-30 DIAGNOSIS — Z87891 Personal history of nicotine dependence: Secondary | ICD-10-CM | POA: Insufficient documentation

## 2017-03-30 DIAGNOSIS — G894 Chronic pain syndrome: Secondary | ICD-10-CM | POA: Insufficient documentation

## 2017-03-30 DIAGNOSIS — M47816 Spondylosis without myelopathy or radiculopathy, lumbar region: Secondary | ICD-10-CM | POA: Diagnosis not present

## 2017-03-30 DIAGNOSIS — Z79891 Long term (current) use of opiate analgesic: Secondary | ICD-10-CM | POA: Diagnosis not present

## 2017-03-30 DIAGNOSIS — E785 Hyperlipidemia, unspecified: Secondary | ICD-10-CM | POA: Insufficient documentation

## 2017-03-30 DIAGNOSIS — M5410 Radiculopathy, site unspecified: Secondary | ICD-10-CM

## 2017-03-30 MED ORDER — MORPHINE SULFATE ER 30 MG PO TBCR: 30.0000 mg | EXTENDED_RELEASE_TABLET | Freq: Three times a day (TID) | ORAL | 0 refills | Status: DC

## 2017-03-30 MED ORDER — MORPHINE SULFATE 15 MG PO TABS: 15.0000 mg | ORAL_TABLET | Freq: Four times a day (QID) | ORAL | 0 refills | Status: DC | PRN

## 2017-03-30 NOTE — Progress Notes (Signed)
Patient's Name: Lance Roman  MRN: [id number]  Referring Provider: Crawford, Elizabeth A, *  DOB: 04/04/1960  PCP: Crawford, Elizabeth A, MD  DOS: 03/30/2017  Note by: Crystal M. King NP  Service setting: Ambulatory outpatient  Specialty: Interventional Pain Management  Location: [facility] (AMB) Pain Management Facility    Patient type: Established    Primary Reason(s) for Visit: Encounter for prescription drug management. (Level of risk: moderate)  CC: Back Pain (lower)  HPI  Mr. Imbert is a 57 y.o. year old, male patient, who comes today for a medication management evaluation. He has GERD; Chronic low back pain (Primary Area of Pain) (Bilateral) (L>R); Pharmacologic therapy; Infected sebaceous cyst; Disorder of skeletal system; Problems influencing health status; Opiate use (150 MME/day); Long term prescription opiate use; Chronic pain syndrome; Muscle cramps; Long-term current use of opiate analgesic; Chronic lower extremity pain (Secondary Area of Pain) (Left); Chronic lumbar radicular pain (L5) (Left); Retrolisthesis (4-5 mm) (L3 over L4) (L2 over L3; Failed back surgical syndrome (Fusion L4-S1); Lumbar facet hypertrophy (Bilateral); Lumbar facet syndrome (Bilateral) (L>R); Spinal cord stimulator dysfunction (fractured leads); Lumbar facet osteoarthritis (Bilateral); and Osteoarthritis of lumbar spine on their problem list. His primarily concern today is the Back Pain (lower)  Pain Assessment: Location: Lower Back Radiating: radates down left leg to heel Onset: More than a month ago Duration: Chronic pain Quality: Cramping, Aching, Discomfort, Spasm, Constant Severity: 1 /10 (self-reported pain score)  Note: Reported level is compatible with observation.                          Timing: Constant Modifying factors: exercise , meds  Mr. Abalos was last scheduled for an appointment on 02/27/2017 for medication management. During today's appointment we reviewed Mr. Flinn's chronic pain status, as  well as his outpatient medication regimen. He states that he is to have CT scan with contrast. He has not heard anything about this he states it is sceondary to this being a Workers Compensation claim. He amdits that he has problems getting approval for his medications. He denies any pain related concerns today. He admits that his pain is stable.   The patient  reports that he does not use drugs. His body mass index is 27.26 kg/m.  Further details on both, my assessment(s), as well as the proposed treatment plan, please see below.  Controlled Substance Pharmacotherapy Assessment REMS (Risk Evaluation and Mitigation Strategy)  Analgesic:Morphine ER 30 mg every 8 hours + morphine IR 15 mg every 6 hours (150MME/day) MME/day: 150mg/day Brown, Sheila A, RN  03/30/2017  9:14 AM  Sign at close encounter Nursing Pain Medication Assessment:  Safety precautions to be maintained throughout the outpatient stay will include: orient to surroundings, keep bed in low position, maintain call bell within reach at all times, provide assistance with transfer out of bed and ambulation.  Medication Inspection Compliance: Pill count conducted under aseptic conditions, in front of the patient. Neither the pills nor the bottle was removed from the patient's sight at any time. Once count was completed pills were immediately returned to the patient in their original bottle.  Medication #1: Morphine ER (MSContin) Pill/Patch Count: 58 of 90 pills remain Pill/Patch Appearance: Markings consistent with prescribed medication Bottle Appearance: Standard pharmacy container. Clearly labeled. Filled Date: 12 / 20 / 2018 Last Medication intake:  Today  Medication #2: Morphine IR Pill/Patch Count: 67 of 120 pills remain Pill/Patch Appearance: Markings consistent with prescribed medication Bottle Appearance: Standard   pharmacy container. Clearly labeled. Filled Date: 12 / 13 / 2018 Last Medication intake:  Today    Pharmacokinetics: Liberation and absorption (onset of action): WNL Distribution (time to peak effect): WNL Metabolism and excretion (duration of action): WNL         Pharmacodynamics: Desired effects: Analgesia: Mr. Nicoson reports >50% benefit. Functional ability: Patient reports that medication allows him to accomplish basic ADLs Clinically meaningful improvement in function (CMIF): Sustained CMIF goals met Perceived effectiveness: Described as relatively effective, allowing for increase in activities of daily living (ADL) Undesirable effects: Side-effects or Adverse reactions: None reported Monitoring:  PMP: Online review of the past 12-month period conducted. Compliant with practice rules and regulations Last UDS on record: Summary  Date Value Ref Range Status  01/30/2017 FINAL  Final    Comment:    ==================================================================== TOXASSURE SELECT 13 (MW) ==================================================================== Test                             Result       Flag       Units Drug Present and Declared for Prescription Verification   Morphine                       >20000       EXPECTED   ng/mg creat   Normorphine                    1152         EXPECTED   ng/mg creat    Potential sources of large amounts of morphine in the absence of    codeine include administration of morphine or use of heroin.    Normorphine is an expected metabolite of morphine.   Hydromorphone                  318          EXPECTED   ng/mg creat    Hydromorphone may be present as a metabolite of morphine;    concentrations of hydromorphone rarely exceed 5% of the morphine    concentration when this is the source of hydromorphone. ==================================================================== Test                      Result    Flag   Units      Ref Range   Creatinine              50               mg/dL       >=20 ==================================================================== Declared Medications:  The flagging and interpretation on this report are based on the  following declared medications.  Unexpected results may arise from  inaccuracies in the declared medications.  **Note: The testing scope of this panel includes these medications:  Morphine (MS Contin)  Morphine (MSIR)  **Note: The testing scope of this panel does not include following  reported medications:  Celecoxib (Celebrex)  Multivitamin  Olopatadine (patanol)  Omeprazole (Nexium) ==================================================================== For clinical consultation, please call [phone]. ====================================================================    UDS interpretation: Compliant          Medication Assessment Form: Reviewed. Patient indicates being compliant with therapy Treatment compliance: Compliant Risk Assessment Profile: Aberrant behavior: See prior evaluations. None observed or detected today Comorbid factors increasing risk of overdose: See prior notes. No additional risks detected today Risk of substance use disorder (SUD):   Low Opioid Risk Tool - 03/30/17 0902      Family History of Substance Abuse   Alcohol  Negative    Illegal Drugs  Negative    Rx Drugs  Negative      Personal History of Substance Abuse   Alcohol  Negative    Illegal Drugs  Negative    Rx Drugs  Negative      Age   Age between 16-45 years   No      History of Preadolescent Sexual Abuse   History of Preadolescent Sexual Abuse  Negative or Male      Psychological Disease   Psychological Disease  Negative    Depression  Negative      Total Score   Opioid Risk Tool Scoring  0    Opioid Risk Interpretation  Low Risk      ORT Scoring interpretation table:  Score <3 = Low Risk for SUD  Score between 4-7 = Moderate Risk for SUD  Score >8 = High Risk for Opioid Abuse   Risk Mitigation Strategies:   Patient Counseling: Covered Patient-Prescriber Agreement (PPA): Present and active  Notification to other healthcare providers: Done  Pharmacologic Plan: No change in therapy, at this time.             Laboratory Chemistry  Inflammation Markers (CRP: Acute Phase) (ESR: Chronic Phase) Lab Results  Component Value Date   CRP 2.5 12/12/2016   ESRSEDRATE 10 12/12/2016                 Rheumatology Markers Lab Results  Component Value Date   LABURIC 6.4 08/11/2008                Renal Function Markers Lab Results  Component Value Date   BUN 20 12/12/2016   CREATININE 0.74 (L) 12/12/2016   GFRAA 120 12/12/2016   GFRNONAA 104 12/12/2016                 Hepatic Function Markers Lab Results  Component Value Date   AST 21 12/12/2016   ALT 12 12/30/2014   ALBUMIN 4.5 12/12/2016   ALKPHOS 66 12/12/2016   HCVAB NEGATIVE 12/30/2014                 Electrolytes Lab Results  Component Value Date   NA 140 12/12/2016   K 4.5 12/12/2016   CL 99 12/12/2016   CALCIUM 9.7 12/12/2016   MG 1.9 12/12/2016                 Neuropathy Markers Lab Results  Component Value Date   VITAMINB12 632 12/12/2016                 Bone Pathology Markers Lab Results  Component Value Date   25OHVITD1 44 12/12/2016   25OHVITD2 <1.0 12/12/2016   25OHVITD3 43 12/12/2016                 Coagulation Parameters Lab Results  Component Value Date   PLT 167.0 03/03/2011                 Cardiovascular Markers Lab Results  Component Value Date   HGB 13.1 03/03/2011   HCT 38.5 (L) 03/03/2011                 CA Markers No results found for: CEA, CA125, LABCA2               Note: Lab results reviewed.    Recent Diagnostic Imaging Results  DG C-Arm 1-60 Min-No Report Fluoroscopy was utilized by the requesting physician.  No radiographic  interpretation.   Complexity Note: Imaging results reviewed. Results shared with [name], using Layman's terms.                         Meds    Current Outpatient Medications:  .  celecoxib (CELEBREX) 200 MG capsule, Take 1 capsule (200 mg total) by mouth 2 (two) times daily., Disp: 180 capsule, Rfl: 0 .  esomeprazole (NEXIUM) 40 MG capsule, Take 1 capsule (40 mg total) by mouth 2 (two) times daily before a meal., Disp: 180 capsule, Rfl: 0 .  [START ON 04/15/2017] morphine (MS CONTIN) 30 MG 12 hr tablet, Take 1 tablet (30 mg total) by mouth every 8 (eight) hours., Disp: 90 tablet, Rfl: 0 .  [START ON 04/15/2017] morphine (MSIR) 15 MG tablet, Take 1 tablet (15 mg total) by mouth every 6 (six) hours as needed for severe pain., Disp: 120 tablet, Rfl: 0 .  Multiple Vitamin (MULTIVITAMIN) capsule, Take 1 capsule by mouth daily., Disp: , Rfl:  .  olopatadine (PATANOL) 0.1 % ophthalmic solution, Place 1 drop into both eyes as needed. , Disp: , Rfl:   ROS  Constitutional: Denies any fever or chills Gastrointestinal: No reported hemesis, hematochezia, vomiting, or acute GI distress Musculoskeletal: Denies any acute onset joint swelling, redness, loss of ROM, or weakness Neurological: No reported episodes of acute onset apraxia, aphasia, dysarthria, agnosia, amnesia, paralysis, loss of coordination, or loss of consciousness  Allergies  Mr. Diltz has No Known Allergies.  [place]  Drug: Mr. Galli  reports that he does not use drugs. Alcohol:  reports that he drinks about 1.8 oz of alcohol per week. Tobacco:  reports that he has quit smoking. His smokeless tobacco use includes chew. Medical:  has a past medical history of Allergic rhinitis, Chronic pain syndrome, DDD (degenerative disc disease), lumbosacral, GERD (gastroesophageal reflux disease), Hyperlipidemia, Osteoarthritis, and S/P appendectomy. Surgical: Mr. Daman  has a past surgical history that includes Appendectomy; repairs of bilateral ankle fractures; Spinal fusion (N/A); and Spinal cord stimulator implant (N/A). Family: family history includes Diabetes in his maternal grandmother;  Heart disease in his father.  Constitutional Exam  General appearance: Well nourished, well developed, and well hydrated. In no apparent acute distress Vitals:   03/30/17 0854  BP: 128/73  Pulse: 60  Temp: 97.6 F (36.4 C)  SpO2: 100%  Weight: 190 lb (86.2 kg)  Height: 5' 10" (1.778 m)   BMI Assessment: Estimated body mass index is 27.26 kg/m as calculated from the following:   Height as of this encounter: 5' 10" (1.778 m).   Weight as of this encounter: 190 lb (86.2 kg). Psych/Mental status: Alert, oriented x 3 (person, place, & time)       Eyes: PERLA Respiratory: No evidence of acute respiratory distress  Cervical Spine Area Exam  Skin & Axial Inspection: No masses, redness, edema, swelling, or associated skin lesions Alignment: Symmetrical Functional ROM: Unrestricted ROM      Stability: No instability detected Muscle Tone/Strength: Functionally intact. No obvious neuro-muscular anomalies detected. Sensory (Neurological): Unimpaired Palpation: No palpable anomalies              Upper Extremity (UE) Exam    Side: Right upper extremity  Side: Left upper extremity  Skin & Extremity Inspection: Skin color, temperature, and hair growth are WNL. No peripheral edema or cyanosis. No  masses, redness, swelling, asymmetry, or associated skin lesions. No contractures.  Skin & Extremity Inspection: Skin color, temperature, and hair growth are WNL. No peripheral edema or cyanosis. No masses, redness, swelling, asymmetry, or associated skin lesions. No contractures.  Functional ROM: Unrestricted ROM          Functional ROM: Unrestricted ROM          Muscle Tone/Strength: Functionally intact. No obvious neuro-muscular anomalies detected.  Muscle Tone/Strength: Functionally intact. No obvious neuro-muscular anomalies detected.  Sensory (Neurological): Unimpaired          Sensory (Neurological): Unimpaired          Palpation: No palpable anomalies              Palpation: No palpable anomalies               Specialized Test(s): Deferred         Specialized Test(s): Deferred          Thoracic Spine Area Exam  Skin & Axial Inspection: No masses, redness, or swelling Alignment: Symmetrical Functional ROM: Unrestricted ROM Stability: No instability detected Muscle Tone/Strength: Functionally intact. No obvious neuro-muscular anomalies detected. Sensory (Neurological): Unimpaired Muscle strength & Tone: No palpable anomalies  Lumbar Spine Area Exam  Skin & Axial Inspection: No masses, redness, or swelling Alignment: Symmetrical Functional ROM: Unrestricted ROM      Stability: No instability detected Muscle Tone/Strength: Functionally intact. No obvious neuro-muscular anomalies detected. Sensory (Neurological): Unimpaired Palpation: Complains of area being tender to palpation       Provocative Tests: Lumbar Hyperextension and rotation test: evaluation deferred today       Lumbar Lateral bending test: evaluation deferred today       Patrick's Maneuver: evaluation deferred today                    Gait & Posture Assessment  Ambulation: Unassisted Gait: Relatively normal for age and body habitus Posture: WNL   Lower Extremity Exam    Side: Right lower extremity  Side: Left lower extremity  Skin & Extremity Inspection: Skin color, temperature, and hair growth are WNL. No peripheral edema or cyanosis. No masses, redness, swelling, asymmetry, or associated skin lesions. No contractures.  Skin & Extremity Inspection: Skin color, temperature, and hair growth are WNL. No peripheral edema or cyanosis. No masses, redness, swelling, asymmetry, or associated skin lesions. No contractures.  Functional ROM: Unrestricted ROM          Functional ROM: Unrestricted ROM          Muscle Tone/Strength: Functionally intact. No obvious neuro-muscular anomalies detected.  Muscle Tone/Strength: Functionally intact. No obvious neuro-muscular anomalies detected.  Sensory (Neurological): Unimpaired  Sensory  (Neurological): Unimpaired  Palpation: No palpable anomalies  Palpation: No palpable anomalies   Assessment  Primary Diagnosis & Pertinent Problem List: The primary encounter diagnosis was Chronic low back pain (Primary Area of Pain) (Bilateral) (L>R). Diagnoses of Chronic lower extremity pain (Secondary Area of Pain) (Left), Chronic lumbar radicular pain (L5) (Left), Lumbar facet syndrome (Bilateral) (L>R), and Chronic pain syndrome were also pertinent to this visit.  Status Diagnosis  Controlled Controlled Controlled 1. Chronic low back pain (Primary Area of Pain) (Bilateral) (L>R)   2. Chronic lower extremity pain (Secondary Area of Pain) (Left)   3. Chronic lumbar radicular pain (L5) (Left)   4. Lumbar facet syndrome (Bilateral) (L>R)   5. Chronic pain syndrome     Problems updated and reviewed during this visit: No problems  updated. Plan of Care  Pharmacotherapy (Medications Ordered): Meds ordered this encounter  Medications  . morphine (MS CONTIN) 30 MG 12 hr tablet    Sig: Take 1 tablet (30 mg total) by mouth every 8 (eight) hours.    Dispense:  90 tablet    Refill:  0    Fill one day early if pharmacy is closed on scheduled refill date. Do not fill until: 04/15/2017 To last until:05/15/2017    Order Specific Question:   Supervising Provider    Answer:   NAVEIRA, FRANCISCO [[id number]]  . morphine (MSIR) 15 MG tablet    Sig: Take 1 tablet (15 mg total) by mouth every 6 (six) hours as needed for severe pain.    Dispense:  120 tablet    Refill:  0    Fill one day early if pharmacy is closed on scheduled refill date. Do not fill until: 04/15/2017 To last until: 05/15/2017    Order Specific Question:   Supervising Provider    Answer:   NAVEIRA, FRANCISCO [[id number]]  This SmartLink is deprecated. Use AVSMEDLIST instead to display the medication list for a patient. Medications administered today: Wess C. Ranes had no medications administered during this visit. Lab-work,  procedure(s), and/or referral(s): No orders of the defined types were placed in this encounter.  Imaging and/or referral(s): None  Interventional therapies: Planned, scheduled, and/or pending:   Not at this time.   Considering: Removal of nonfunctional spinal cord stimulatorfollowed by an MRI of the lumbar spine with and without contrast. Diagnostic bilateral lumbar facet block Possible bilateral lumbar facet RFA Diagnostic caudal epidural steroid injection plus diagnostic epidurogram Possible Racz procedure   PRN Procedures: None at this time    Provider-requested follow-up: Return in about 4 weeks (around 04/27/2017) for MedMgmt with Me (Crystal King).  Future Appointments  Date Time Provider Department Center  04/26/2017  8:45 AM King, Crystal M, NP [facility] None   Primary Care Physician: Crawford, Elizabeth A, MD Location: [facility] Note by: Crystal M. King NP Date: 03/30/2017; Time: 1:31 PM  Pain Score Disclaimer: We use the NRS-11 scale. This is a self-reported, subjective measurement of pain severity with only modest accuracy. It is used primarily to identify changes within a particular patient. It must be understood that outpatient pain scales are significantly less accurate that those used for research, where they can be applied under ideal controlled circumstances with minimal exposure to variables. In reality, the score is likely to be a combination of pain intensity and pain affect, where pain affect describes the degree of emotional arousal or changes in action readiness caused by the sensory experience of pain. Factors such as social and work situation, setting, emotional state, anxiety levels, expectation, and prior pain experience may influence pain perception and show large inter-individual differences that may also be affected by time variables.  Patient instructions provided during this appointment: Patient Instructions    ____________________________________________________________________________________________  Medication Rules rx x1 given for ms contin 30mg   Morphine 15mg  Applies to: All patients receiving prescriptions (written or electronic).  Pharmacy of record: Pharmacy where electronic prescriptions will be sent. If written prescriptions are taken to a different pharmacy, please inform the nursing staff. The pharmacy listed in the electronic medical record should be the one where you would like electronic prescriptions to be sent.  Prescription refills: Only during scheduled appointments. Applies to both, written and electronic prescriptions.  NOTE: The following applies primarily to controlled substances (Opioid* Pain Medications).     Patient's responsibilities: 1. Pain Pills: Bring all pain pills to every appointment (except for procedure appointments). 2. Pill Bottles: Bring pills in original pharmacy bottle. Always bring newest bottle. Bring bottle, even if empty. 3. Medication refills: You are responsible for knowing and keeping track of what medications you need refilled. The day before your appointment, write a list of all prescriptions that need to be refilled. Bring that list to your appointment and give it to the admitting nurse. Prescriptions will be written only during appointments. If you forget a medication, it will not be "Called in", "Faxed", or "electronically sent". You will need to get another appointment to get these prescribed. 4. Prescription Accuracy: You are responsible for carefully inspecting your prescriptions before leaving our office. Have the discharge nurse carefully go over each prescription with you, before taking them home. Make sure that your name is accurately spelled, that your address is correct. Check the name and dose of your medication to make sure it is accurate. Check the number of pills, and the written instructions to make sure they are clear and accurate.  Make sure that you are given enough medication to last until your next medication refill appointment. 5. Taking Medication: Take medication as prescribed. Never take more pills than instructed. Never take medication more frequently than prescribed. Taking less pills or less frequently is permitted and encouraged, when it comes to controlled substances (written prescriptions).  6. Inform other Doctors: Always inform, all of your healthcare providers, of all the medications you take. 7. Pain Medication from other Providers: You are not allowed to accept any additional pain medication from any other Doctor or Healthcare provider. There are two exceptions to this rule. (see below) In the event that you require additional pain medication, you are responsible for notifying us, as stated below. 8. Medication Agreement: You are responsible for carefully reading and following our Medication Agreement. This must be signed before receiving any prescriptions from our practice. Safely store a copy of your signed Agreement. Violations to the Agreement will result in no further prescriptions. (Additional copies of our Medication Agreement are available upon request.) 9. Laws, Rules, & Regulations: All patients are expected to follow all Federal and [employer], [employer], Rules, [employer]. Ignorance of the Laws does not constitute a valid excuse. The use of any illegal substances is prohibited. 10. Adopted CDC guidelines & recommendations: Target dosing levels will be at or below 60 MME/day. Use of benzodiazepines** is not recommended.  Exceptions: There are only two exceptions to the rule of not receiving pain medications from other Healthcare Providers. 1. Exception #1 (Emergencies): In the event of an emergency (i.e.: accident requiring emergency care), you are allowed to receive additional pain medication. However, you are responsible for: As soon as you are able, call our office [phone], at any time of the  day or night, and leave a message stating your name, the date and nature of the emergency, and the name and dose of the medication prescribed. In the event that your call is answered by a member of our staff, make sure to document and save the date, time, and the name of the person that took your information.  2. Exception #2 (Planned Surgery): In the event that you are scheduled by another doctor or dentist to have any type of surgery or procedure, you are allowed (for a period no longer than 30 days), to receive additional pain medication, for the acute post-op pain. However, in this case, you are responsible  for picking up a copy of our "Post-op Pain Management for Surgeons" handout, and giving it to your surgeon or dentist. This document is available at our office, and does not require an appointment to obtain it. Simply go to our office during business hours (Monday-Thursday from 8:00 AM to 4:00 PM) (Friday 8:00 AM to 12:00 Noon) or if you have a scheduled appointment with [place], prior to your surgery, and ask for it by name. In addition, you will need to provide [place] with your name, name of your surgeon, type of surgery, and date of procedure or surgery.  *Opioid medications include: morphine, codeine, oxycodone, oxymorphone, hydrocodone, hydromorphone, meperidine, tramadol, tapentadol, buprenorphine, fentanyl, methadone. **Benzodiazepine medications include: diazepam (Valium), alprazolam (Xanax), clonazepam (Klonopine), lorazepam (Ativan), clorazepate (Tranxene), chlordiazepoxide (Librium), estazolam (Prosom), oxazepam (Serax), temazepam (Restoril), triazolam (Halcion)  ____________________________________________________________________________________________

## 2017-03-30 NOTE — Progress Notes (Signed)
Nursing Pain Medication Assessment:  Safety precautions to be maintained throughout the outpatient stay will include: orient to surroundings, keep bed in low position, maintain call bell within reach at all times, provide assistance with transfer out of bed and ambulation.  Medication Inspection Compliance: Pill count conducted under aseptic conditions, in front of the patient. Neither the pills nor the bottle was removed from the patient's sight at any time. Once count was completed pills were immediately returned to the patient in their original bottle.  Medication #1: Morphine ER (MSContin) Pill/Patch Count: 58 of 90 pills remain Pill/Patch Appearance: Markings consistent with prescribed medication Bottle Appearance: Standard pharmacy container. Clearly labeled. Filled Date: 1712 / 20 / 2018 Last Medication intake:  Today  Medication #2: Morphine IR Pill/Patch Count: 67 of 120 pills remain Pill/Patch Appearance: Markings consistent with prescribed medication Bottle Appearance: Standard pharmacy container. Clearly labeled. Filled Date: 3212 / 13 / 2018 Last Medication intake:  Today

## 2017-03-30 NOTE — Patient Instructions (Addendum)
____________________________________________________________________________________________  Medication Rules rx x1 given for ms contin 30mg    Morphine 15mg   Applies to: All patients receiving prescriptions (written or electronic).  Pharmacy of record: Pharmacy where electronic prescriptions will be sent. If written prescriptions are taken to a different pharmacy, please inform the nursing staff. The pharmacy listed in the electronic medical record should be the one where you would like electronic prescriptions to be sent.  Prescription refills: Only during scheduled appointments. Applies to both, written and electronic prescriptions.  NOTE: The following applies primarily to controlled substances (Opioid* Pain Medications).   Patient's responsibilities: 1. Pain Pills: Bring all pain pills to every appointment (except for procedure appointments). 2. Pill Bottles: Bring pills in original pharmacy bottle. Always bring newest bottle. Bring bottle, even if empty. 3. Medication refills: You are responsible for knowing and keeping track of what medications you need refilled. The day before your appointment, write a list of all prescriptions that need to be refilled. Bring that list to your appointment and give it to the admitting nurse. Prescriptions will be written only during appointments. If you forget a medication, it will not be "Called in", "Faxed", or "electronically sent". You will need to get another appointment to get these prescribed. 4. Prescription Accuracy: You are responsible for carefully inspecting your prescriptions before leaving our office. Have the discharge nurse carefully go over each prescription with you, before taking them home. Make sure that your name is accurately spelled, that your address is correct. Check the name and dose of your medication to make sure it is accurate. Check the number of pills, and the written instructions to make sure they are clear and accurate. Make  sure that you are given enough medication to last until your next medication refill appointment. 5. Taking Medication: Take medication as prescribed. Never take more pills than instructed. Never take medication more frequently than prescribed. Taking less pills or less frequently is permitted and encouraged, when it comes to controlled substances (written prescriptions).  6. Inform other Doctors: Always inform, all of your healthcare providers, of all the medications you take. 7. Pain Medication from other Providers: You are not allowed to accept any additional pain medication from any other Doctor or Healthcare provider. There are two exceptions to this rule. (see below) In the event that you require additional pain medication, you are responsible for notifying us, as stated below. 8. Medication Agreement: You are responsible for carefully reading and following our Medication Agreement. This must be signed before receiving any prescriptions from our practice. Safely store a copy of your signed Agreement. Violations to the Agreement will result in no further prescriptions. (Additional copies of our Medication Agreement are available upon request.) 9. Laws, Rules, & Regulations: All patients are expected to follow all [address] and [employer], Rules, [employer]. Ignorance of the Laws does not constitute a valid excuse. The use of any illegal substances is prohibited. 10. Adopted CDC guidelines & recommendations: Target dosing levels will be at or below 60 MME/day. Use of benzodiazepines** is not recommended.  Exceptions: There are only two exceptions to the rule of not receiving pain medications from other Healthcare Providers. 1. Exception #1 (Emergencies): In the event of an emergency (i.e.: accident requiring emergency care), you are allowed to receive additional pain medication. However, you are responsible for: As soon as you are able, call our office [phone], at any time of the day or  night, and leave a message stating your name, the date and nature  of the emergency, and the name and dose of the medication prescribed. In the event that your call is answered by a member of our staff, make sure to document and save the date, time, and the name of the person that took your information.  2. Exception #2 (Planned Surgery): In the event that you are scheduled by another doctor or dentist to have any type of surgery or procedure, you are allowed (for a period no longer than 30 days), to receive additional pain medication, for the acute post-op pain. However, in this case, you are responsible for picking up a copy of our "Post-op Pain Management for Surgeons" handout, and giving it to your surgeon or dentist. This document is available at our office, and does not require an appointment to obtain it. Simply go to our office during business hours (Monday-Thursday from 8:00 AM to 4:00 PM) (Friday 8:00 AM to 12:00 Noon) or if you have a scheduled appointment with us, prior to your surgery, and ask for it by name. In addition, you will need to provide us with your name, name of your surgeon, type of surgery, and date of procedure or surgery.  *Opioid medications include: morphine, codeine, oxycodone, oxymorphone, hydrocodone, hydromorphone, meperidine, tramadol, tapentadol, buprenorphine, fentanyl, methadone. **Benzodiazepine medications include: diazepam (Valium), alprazolam (Xanax), clonazepam (Klonopine), lorazepam (Ativan), clorazepate (Tranxene), chlordiazepoxide (Librium), estazolam (Prosom), oxazepam (Serax), temazepam (Restoril), triazolam (Halcion)  ____________________________________________________________________________________________

## 2017-03-30 NOTE — Progress Notes (Deleted)
Nursing Pain Medication Assessment:  Safety precautions to be maintained throughout the outpatient stay will include: orient to surroundings, keep bed in low position, maintain call bell within reach at all times, provide assistance with transfer out of bed and ambulation.  Medication Inspection Compliance: Pill count conducted under aseptic conditions, in front of the patient. Neither the pills nor the bottle was removed from the patient's sight at any time. Once count was completed pills were immediately returned to the patient in their original bottle.  Medication: See above Pill/Patch Count: *** of *** pills remain Pill/Patch Appearance: Markings consistent with prescribed medication Bottle Appearance: Standard pharmacy container. Clearly labeled. Filled Date: *** / *** / 2018 Last Medication intake:  Today 

## 2017-04-14 ENCOUNTER — Other Ambulatory Visit: Payer: Self-pay

## 2017-04-17 ENCOUNTER — Telehealth: Payer: Self-pay

## 2017-04-17 NOTE — Telephone Encounter (Signed)
Called this number, they need the patient's ID number. That is not available to this office.

## 2017-04-26 ENCOUNTER — Encounter: Payer: Self-pay | Admitting: Nurse Practitioner

## 2017-04-26 ENCOUNTER — Other Ambulatory Visit: Payer: Self-pay

## 2017-04-26 ENCOUNTER — Ambulatory Visit: Payer: Worker's Compensation | Attending: Nurse Practitioner | Admitting: Nurse Practitioner

## 2017-04-26 VITALS — BP 143/75 | HR 59 | Temp 97.9°F | Resp 16 | Ht 70.0 in | Wt 190.0 lb

## 2017-04-26 DIAGNOSIS — M5137 Other intervertebral disc degeneration, lumbosacral region: Secondary | ICD-10-CM | POA: Insufficient documentation

## 2017-04-26 DIAGNOSIS — F329 Major depressive disorder, single episode, unspecified: Secondary | ICD-10-CM | POA: Diagnosis not present

## 2017-04-26 DIAGNOSIS — Z87891 Personal history of nicotine dependence: Secondary | ICD-10-CM | POA: Diagnosis not present

## 2017-04-26 DIAGNOSIS — Z79891 Long term (current) use of opiate analgesic: Secondary | ICD-10-CM | POA: Diagnosis not present

## 2017-04-26 DIAGNOSIS — M79605 Pain in left leg: Secondary | ICD-10-CM | POA: Diagnosis present

## 2017-04-26 DIAGNOSIS — G894 Chronic pain syndrome: Secondary | ICD-10-CM | POA: Insufficient documentation

## 2017-04-26 DIAGNOSIS — M5410 Radiculopathy, site unspecified: Secondary | ICD-10-CM

## 2017-04-26 DIAGNOSIS — M545 Low back pain: Secondary | ICD-10-CM | POA: Diagnosis present

## 2017-04-26 DIAGNOSIS — Z79899 Other long term (current) drug therapy: Secondary | ICD-10-CM | POA: Insufficient documentation

## 2017-04-26 DIAGNOSIS — Z9689 Presence of other specified functional implants: Secondary | ICD-10-CM | POA: Insufficient documentation

## 2017-04-26 DIAGNOSIS — G8929 Other chronic pain: Secondary | ICD-10-CM

## 2017-04-26 DIAGNOSIS — K219 Gastro-esophageal reflux disease without esophagitis: Secondary | ICD-10-CM | POA: Diagnosis not present

## 2017-04-26 DIAGNOSIS — E785 Hyperlipidemia, unspecified: Secondary | ICD-10-CM | POA: Diagnosis not present

## 2017-04-26 DIAGNOSIS — M47816 Spondylosis without myelopathy or radiculopathy, lumbar region: Secondary | ICD-10-CM | POA: Diagnosis not present

## 2017-04-26 DIAGNOSIS — M4316 Spondylolisthesis, lumbar region: Secondary | ICD-10-CM | POA: Diagnosis not present

## 2017-04-26 MED ORDER — MORPHINE SULFATE 15 MG PO TABS: 15.0000 mg | ORAL_TABLET | Freq: Four times a day (QID) | ORAL | 0 refills | Status: DC | PRN

## 2017-04-26 MED ORDER — CELECOXIB 200 MG PO CAPS: 200.0000 mg | ORAL_CAPSULE | Freq: Two times a day (BID) | ORAL | 0 refills | Status: DC

## 2017-04-26 MED ORDER — MORPHINE SULFATE ER 30 MG PO TBCR: 30.0000 mg | EXTENDED_RELEASE_TABLET | Freq: Three times a day (TID) | ORAL | 0 refills | Status: DC

## 2017-04-26 MED ORDER — ESOMEPRAZOLE MAGNESIUM 40 MG PO CPDR: 40.0000 mg | DELAYED_RELEASE_CAPSULE | Freq: Two times a day (BID) | ORAL | 0 refills | Status: DC

## 2017-04-26 NOTE — Progress Notes (Signed)
Patient's Name: GROVE DEFINA  MRN: [id number]  Referring Provider: Hoyt Koch, *  DOB: 1960-04-11  PCP: Hoyt Koch, MD  DOS: 04/26/2017  Note by: Vevelyn Francois NP  Service setting: Ambulatory outpatient  Specialty: Interventional Pain Management  Location: [facility] (AMB) Pain Management Facility    Patient type: Established    Primary Reason(s) for Visit: Encounter for prescription drug management. (Level of risk: moderate)  CC: Back Pain (lower) and Leg Pain (left)  HPI  Mr. Totty is a 57 y.o. year old, male patient, who comes today for a medication management evaluation. He has GERD; Chronic low back pain (Primary Area of Pain) (Bilateral) (L>R); Pharmacologic therapy; Infected sebaceous cyst; Disorder of skeletal system; Problems influencing health status; Opiate use (150 MME/day); Long term prescription opiate use; Chronic pain syndrome; Muscle cramps; Long-term current use of opiate analgesic; Chronic lower extremity pain (Secondary Area of Pain) (Left); Chronic lumbar radicular pain (L5) (Left); Retrolisthesis (4-5 mm) (L3 over L4) (L2 over L3; Failed back surgical syndrome (Fusion L4-S1); Lumbar facet hypertrophy (Bilateral); Lumbar facet syndrome (Bilateral) (L>R); Spinal cord stimulator dysfunction (fractured leads); Lumbar facet osteoarthritis (Bilateral); and Osteoarthritis of lumbar spine on their problem list. His primarily concern today is the Back Pain (lower) and Leg Pain (left)  Pain Assessment: Location: Lower Back Radiating: moves down back of left leg to heel Onset: More than a month ago Duration: Chronic pain Quality: Constant, Aching, Cramping Severity: 1 /10 (self-reported pain score)  Note: Reported level is compatible with observation.                          Effect on ADL: pt states he just cannot do like he used to Timing: Constant Modifying factors: exercise, rest, medications  Mr. Broadus was last scheduled for an appointment on 03/30/2017 for  medication management. During today's appointment we reviewed Mr. Cress's chronic pain status, as well as his outpatient medication regimen. He denies any new concerns. He admits that his pain is the same. He denies any side effects of his current medication.   The patient  reports that he does not use drugs. His body mass index is 27.26 kg/m.  Further details on both, my assessment(s), as well as the proposed treatment plan, please see below.  Controlled Substance Pharmacotherapy Assessment REMS (Risk Evaluation and Mitigation Strategy)  Analgesic:Morphine ER 30 mg every 8 hours + morphine IR 15 mg every 6 hours (150MME/day) MME/day: 17m/day   WRise Patience 04/26/2017  9:06 AM  Signed Nursing Pain Medication Assessment:  Safety precautions to be maintained throughout the outpatient stay will include: orient to surroundings, keep bed in low position, maintain call bell within reach at all times, provide assistance with transfer out of bed and ambulation.  Medication Inspection Compliance: Pill count conducted under aseptic conditions, in front of the patient. Neither the pills nor the bottle was removed from the patient's sight at any time. Once count was completed pills were immediately returned to the patient in their original bottle.  Medication #1: Morphine IR Pill/Patch Count: 86 of 120 pills remain Pill/Patch Appearance: Markings consistent with prescribed medication Bottle Appearance: Standard pharmacy container. Clearly labeled. Filled Date: 1 / 21 / 2019 Last Medication intake:  Today  Medication #2: Morphine ER (MSContin) Pill/Patch Count: 64 of 90 pills remain Pill/Patch Appearance: Markings consistent with prescribed medication Bottle Appearance: Standard pharmacy container. Clearly labeled. Filled Date: 1 / 21 / 2019 Last Medication intake:  Today   Pharmacokinetics: Liberation and absorption (onset of action): WNL Distribution (time to peak effect):  WNL Metabolism and excretion (duration of action): WNL         Pharmacodynamics: Desired effects: Analgesia: Mr. Balkcom reports >50% benefit. Functional ability: Patient reports that medication allows him to accomplish basic ADLs Clinically meaningful improvement in function (CMIF): Sustained CMIF goals met Perceived effectiveness: Described as relatively effective, allowing for increase in activities of daily living (ADL) Undesirable effects: Side-effects or Adverse reactions: None reported Monitoring: [place] PMP: Online review of the past 60-monthperiod conducted. Compliant with practice rules and regulations Last UDS on record: Summary  Date Value Ref Range Status  01/30/2017 FINAL  Final    Comment:    ==================================================================== TOXASSURE SELECT 13 (MW) ==================================================================== Test                             Result       Flag       Units Drug Present and Declared for Prescription Verification   Morphine                       >20000       EXPECTED   ng/mg creat   Normorphine                    1152         EXPECTED   ng/mg creat    Potential sources of large amounts of morphine in the absence of    codeine include administration of morphine or use of heroin.    Normorphine is an expected metabolite of morphine.   Hydromorphone                  318          EXPECTED   ng/mg creat    Hydromorphone may be present as a metabolite of morphine;    concentrations of hydromorphone rarely exceed 5% of the morphine    concentration when this is the source of hydromorphone. ==================================================================== Test                      Result    Flag   Units      Ref Range   Creatinine              50               mg/dL      >=20 ==================================================================== Declared Medications:  The flagging and interpretation on this report are  based on the  following declared medications.  Unexpected results may arise from  inaccuracies in the declared medications.  **Note: The testing scope of this panel includes these medications:  Morphine (MS Contin)  Morphine (MSIR)  **Note: The testing scope of this panel does not include following  reported medications:  Celecoxib (Celebrex)  Multivitamin  Olopatadine (patanol)  Omeprazole (Nexium) ==================================================================== For clinical consultation, please call [phone] ====================================================================    UDS interpretation: Compliant          Medication Assessment Form: Reviewed. Patient indicates being compliant with therapy Treatment compliance: Compliant Risk Assessment Profile: Aberrant behavior: See prior evaluations. None observed or detected today Comorbid factors increasing risk of overdose: See prior notes. No additional risks detected today Risk of substance use disorder (SUD): Low Opioid Risk Tool - 04/26/17 0902      Family History of  Substance Abuse   Alcohol  Negative    Illegal Drugs  Negative    Rx Drugs  Negative      Personal History of Substance Abuse   Alcohol  Negative    Illegal Drugs  Negative    Rx Drugs  Negative      Age   Age between 29-45 years   No      History of Preadolescent Sexual Abuse   History of Preadolescent Sexual Abuse  Negative or Male      Psychological Disease   Psychological Disease  Negative    Depression  Negative      Total Score   Opioid Risk Tool Scoring  0    Opioid Risk Interpretation  Low Risk      ORT Scoring interpretation table:  Score <3 = Low Risk for SUD  Score between 4-7 = Moderate Risk for SUD  Score >8 = High Risk for Opioid Abuse   Risk Mitigation Strategies:  Patient Counseling: Covered Patient-Prescriber Agreement (PPA): Present and active  Notification to other healthcare providers: Done  Pharmacologic  Plan: No change in therapy, at this time.             Laboratory Chemistry  Inflammation Markers (CRP: Acute Phase) (ESR: Chronic Phase) Lab Results  Component Value Date   CRP 2.5 12/12/2016   ESRSEDRATE 10 12/12/2016                 Rheumatology Markers Lab Results  Component Value Date   LABURIC 6.4 08/11/2008                Renal Function Markers Lab Results  Component Value Date   BUN 20 12/12/2016   CREATININE 0.74 (L) 12/12/2016   GFRAA 120 12/12/2016   GFRNONAA 104 12/12/2016                 Hepatic Function Markers Lab Results  Component Value Date   AST 21 12/12/2016   ALT 12 12/30/2014   ALBUMIN 4.5 12/12/2016   ALKPHOS 66 12/12/2016   HCVAB NEGATIVE 12/30/2014                 Electrolytes Lab Results  Component Value Date   NA 140 12/12/2016   K 4.5 12/12/2016   CL 99 12/12/2016   CALCIUM 9.7 12/12/2016   MG 1.9 12/12/2016                 Neuropathy Markers Lab Results  Component Value Date   EHUDJSHF02 637 12/12/2016                 Bone Pathology Markers Lab Results  Component Value Date   25OHVITD1 44 12/12/2016   25OHVITD2 <1.0 12/12/2016   25OHVITD3 43 12/12/2016                 Coagulation Parameters Lab Results  Component Value Date   PLT 167.0 03/03/2011                 Cardiovascular Markers Lab Results  Component Value Date   HGB 13.1 03/03/2011   HCT 38.5 (L) 03/03/2011                 CA Markers No results found for: CEA, CA125, LABCA2               Note: Lab results reviewed.  Recent Diagnostic Imaging Results  DG C-Arm 1-60 Min-No Report Fluoroscopy was utilized by the  requesting physician.  No radiographic  interpretation.   Complexity Note: Imaging results reviewed. Results shared with Mr. Socarras, using Layman's terms.                         Meds   Current Outpatient Medications:  .  celecoxib (CELEBREX) 200 MG capsule, Take 1 capsule (200 mg total) by mouth 2 (two) times daily., Disp: 180 capsule,  Rfl: 0 .  docusate sodium (COLACE) 100 MG capsule, Take 200 mg by mouth daily., Disp: , Rfl:  .  esomeprazole (NEXIUM) 40 MG capsule, Take 1 capsule (40 mg total) by mouth 2 (two) times daily before a meal., Disp: 180 capsule, Rfl: 0 .  [START ON 05/17/2017] morphine (MS CONTIN) 30 MG 12 hr tablet, Take 1 tablet (30 mg total) by mouth every 8 (eight) hours., Disp: 90 tablet, Rfl: 0 .  [START ON 05/17/2017] morphine (MSIR) 15 MG tablet, Take 1 tablet (15 mg total) by mouth every 6 (six) hours as needed for severe pain., Disp: 120 tablet, Rfl: 0 .  Multiple Vitamin (MULTIVITAMIN) capsule, Take 1 capsule by mouth daily., Disp: , Rfl:  .  olopatadine (PATANOL) 0.1 % ophthalmic solution, Place 1 drop into both eyes as needed. , Disp: , Rfl:   ROS  Constitutional: Denies any fever or chills Gastrointestinal: No reported hemesis, hematochezia, vomiting, or acute GI distress Musculoskeletal: Denies any acute onset joint swelling, redness, loss of ROM, or weakness Neurological: No reported episodes of acute onset apraxia, aphasia, dysarthria, agnosia, amnesia, paralysis, loss of coordination, or loss of consciousness  Allergies  Mr. Achorn has No Known Allergies.  [place]  Drug: Mr. Goheen  reports that he does not use drugs. Alcohol:  reports that he drinks about 1.8 oz of alcohol per week. Tobacco:  reports that he has quit smoking. His smokeless tobacco use includes chew. Medical:  has a past medical history of Allergic rhinitis, Chronic pain syndrome, DDD (degenerative disc disease), lumbosacral, GERD (gastroesophageal reflux disease), Hyperlipidemia, Osteoarthritis, and S/P appendectomy. Surgical: Mr. Gipe  has a past surgical history that includes Appendectomy; repairs of bilateral ankle fractures; Spinal fusion (N/A); and Spinal cord stimulator implant (N/A). Family: family history includes Diabetes in his maternal grandmother; Heart disease in his father.  Constitutional Exam  General  appearance: Well nourished, well developed, and well hydrated. In no apparent acute distress Vitals:   04/26/17 0853  BP: (!) 143/75  Pulse: (!) 59  Resp: 16  Temp: 97.9 F (36.6 C)  TempSrc: Oral  SpO2: 100%  Weight: 190 lb (86.2 kg)  Height: _0  (1.778 m)   BMI Assessment: Estimated body mass index is 27.26 kg/m as calculated from the following:   Height as of this encounter: _1  (1.778 m).   Weight as of this encounter: 190 lb (86.2 kg). Psych/Mental status: Alert, oriented x 3 (person, place, & time)       Eyes: PERLA Respiratory: No evidence of acute respiratory distress  Cervical Spine Area Exam  Skin & Axial Inspection: No masses, redness, edema, swelling, or associated skin lesions Alignment: Symmetrical Functional ROM: Unrestricted ROM      Stability: No instability detected Muscle Tone/Strength: Functionally intact. No obvious neuro-muscular anomalies detected. Sensory (Neurological): Unimpaired Palpation: No palpable anomalies              Upper Extremity (UE) Exam    Side: Right upper extremity  Side: Left upper extremity  Skin & Extremity Inspection: Skin color, temperature,  and hair growth are WNL. No peripheral edema or cyanosis. No masses, redness, swelling, asymmetry, or associated skin lesions. No contractures.  Skin & Extremity Inspection: Skin color, temperature, and hair growth are WNL. No peripheral edema or cyanosis. No masses, redness, swelling, asymmetry, or associated skin lesions. No contractures.  Functional ROM: Unrestricted ROM          Functional ROM: Unrestricted ROM          Muscle Tone/Strength: Functionally intact. No obvious neuro-muscular anomalies detected.  Muscle Tone/Strength: Functionally intact. No obvious neuro-muscular anomalies detected.  Sensory (Neurological): Unimpaired          Sensory (Neurological): Unimpaired          Palpation: No palpable anomalies              Palpation: No palpable anomalies              Specialized  Test(s): Deferred         Specialized Test(s): Deferred          Thoracic Spine Area Exam  Skin & Axial Inspection: No masses, redness, or swelling Alignment: Symmetrical Functional ROM: Unrestricted ROM Stability: No instability detected Muscle Tone/Strength: Functionally intact. No obvious neuro-muscular anomalies detected. Sensory (Neurological): Unimpaired Muscle strength & Tone: No palpable anomalies  Lumbar Spine Area Exam  Skin & Axial Inspection: No masses, redness, or swelling SCS  Alignment: Symmetrical Functional ROM: Unrestricted ROM      Stability: No instability detected Muscle Tone/Strength: Functionally intact. No obvious neuro-muscular anomalies detected. Sensory (Neurological): Unimpaired Palpation: No palpable anomalies       Provocative Tests: Lumbar Hyperextension and rotation test: evaluation deferred today       Lumbar Lateral bending test: evaluation deferred today       Patrick's Maneuver: evaluation deferred today                    Gait & Posture Assessment  Ambulation: Unassisted Gait: Relatively normal for age and body habitus Posture: WNL   Lower Extremity Exam    Side: Right lower extremity  Side: Left lower extremity  Skin & Extremity Inspection: Skin color, temperature, and hair growth are WNL. No peripheral edema or cyanosis. No masses, redness, swelling, asymmetry, or associated skin lesions. No contractures.  Skin & Extremity Inspection: Skin color, temperature, and hair growth are WNL. No peripheral edema or cyanosis. No masses, redness, swelling, asymmetry, or associated skin lesions. No contractures.  Functional ROM: Unrestricted ROM          Functional ROM: Unrestricted ROM          Muscle Tone/Strength: Functionally intact. No obvious neuro-muscular anomalies detected.  Muscle Tone/Strength: Functionally intact. No obvious neuro-muscular anomalies detected.  Sensory (Neurological): Unimpaired  Sensory (Neurological): Unimpaired  Palpation:  No palpable anomalies  Palpation: No palpable anomalies   Assessment  Primary Diagnosis & Pertinent Problem List: The primary encounter diagnosis was Chronic lumbar radicular pain (L5) (Left). Diagnoses of Chronic lower extremity pain (Secondary Area of Pain) (Left), Lumbar facet osteoarthritis (Bilateral), Gastroesophageal reflux disease without esophagitis, Chronic pain syndrome, and Osteoarthritis of lumbar spine were also pertinent to this visit.  Status Diagnosis  Controlled Controlled Controlled 1. Chronic lumbar radicular pain (L5) (Left)   2. Chronic lower extremity pain (Secondary Area of Pain) (Left)   3. Lumbar facet osteoarthritis (Bilateral)   4. Gastroesophageal reflux disease without esophagitis   5. Chronic pain syndrome   6. Osteoarthritis of lumbar spine     Problems  updated and reviewed during this visit: No problems updated. Plan of Care  Pharmacotherapy (Medications Ordered): Meds ordered this encounter  Medications  . morphine (MS CONTIN) 30 MG 12 hr tablet    Sig: Take 1 tablet (30 mg total) by mouth every 8 (eight) hours.    Dispense:  90 tablet    Refill:  0    Fill one day early if pharmacy is closed on scheduled refill date. Do not fill until: 05/17/2017 To last until:06/16/2017    Order Specific Question:   Supervising Provider    Answer:   Milinda Pointer [phone]  . morphine (MSIR) 15 MG tablet    Sig: Take 1 tablet (15 mg total) by mouth every 6 (six) hours as needed for severe pain.    Dispense:  120 tablet    Refill:  0    Fill one day early if pharmacy is closed on scheduled refill date. Do not fill until: 2/20/2019To last until:06/16/2017    Order Specific Question:   Supervising Provider    Answer:   Milinda Pointer [phone]  . esomeprazole (NEXIUM) 40 MG capsule    Sig: Take 1 capsule (40 mg total) by mouth 2 (two) times daily before a meal.    Dispense:  180 capsule    Refill:  0    Do not place medication on "Automatic Refill". Fill  one day early if pharmacy is closed on scheduled refill date.    Order Specific Question:   Supervising Provider    Answer:   Milinda Pointer [phone]  . celecoxib (CELEBREX) 200 MG capsule    Sig: Take 1 capsule (200 mg total) by mouth 2 (two) times daily.    Dispense:  180 capsule    Refill:  0    Do not place medication on "Automatic Refill". Fill one day early if pharmacy is closed on scheduled refill date.    Order Specific Question:   Supervising Provider    Answer:   Milinda Pointer [[id number]]   New Prescriptions   No medications on file   Medications administered today: Lourdes Manning. Strehlow had no medications administered during this visit. Lab-work, procedure(s), and/or referral(s): No orders of the defined types were placed in this encounter.  Imaging and/or referral(s): None  Interventional therapies: Planned, scheduled, and/or pending: Not at this time.   Considering: Removal of nonfunctional spinal cord stimulatorfollowed by an MRI of the lumbar spine with and without contrast. Diagnostic bilateral lumbar facet block Possible bilateral lumbar facet RFA Diagnostic caudal epidural steroid injection plus diagnostic epidurogram Possible Racz procedure   PRN Procedures: None at this time   Provider-requested follow-up: Return in 6 weeks (on 06/07/2017) for MedMgmt with Me Donella Stade Edison Pace).  Future Appointments  Date Time Provider [place]  06/01/2017  8:30 AM Vevelyn Francois, NP [facility] None   Primary Care Physician: Hoyt Koch, MD Location: [facility] Management Facility Note by: Vevelyn Francois NP Date: 04/26/2017; Time: 12:51 PM  Pain Score Disclaimer: We use the NRS-11 scale. This is a self-reported, subjective measurement of pain severity with only modest accuracy. It is used primarily to identify changes within a particular patient. It must be understood that outpatient pain scales are significantly less accurate  that those used for research, where they can be applied under ideal controlled circumstances with minimal exposure to variables. In reality, the score is likely to be a combination of pain intensity and pain affect, where pain affect describes the degree of emotional arousal  or changes in action readiness caused by the sensory experience of pain. Factors such as social and work situation, setting, emotional state, anxiety levels, expectation, and prior pain experience may influence pain perception and show large inter-individual differences that may also be affected by time variables.  Patient instructions provided during this appointment: Patient Instructions   ____________________________________________________________________________________________  Medication Rules  Applies to: All patients receiving prescriptions (written or electronic).  Pharmacy of record: Pharmacy where electronic prescriptions will be sent. If written prescriptions are taken to a different pharmacy, please inform the nursing staff. The pharmacy listed in the electronic medical record should be the one where you would like electronic prescriptions to be sent.  Prescription refills: Only during scheduled appointments. Applies to both, written and electronic prescriptions.  NOTE: The following applies primarily to controlled substances (Opioid* Pain Medications).   Patient's responsibilities: 1. Pain Pills: Bring all pain pills to every appointment (except for procedure appointments). 2. Pill Bottles: Bring pills in original pharmacy bottle. Always bring newest bottle. Bring bottle, even if empty. 3. Medication refills: You are responsible for knowing and keeping track of what medications you need refilled. The day before your appointment, write a list of all prescriptions that need to be refilled. Bring that list to your appointment and give it to the admitting nurse. Prescriptions will be written only during  appointments. If you forget a medication, it will not be "Called in", "Faxed", or "electronically sent". You will need to get another appointment to get these prescribed. 4. Prescription Accuracy: You are responsible for carefully inspecting your prescriptions before leaving our office. Have the discharge nurse carefully go over each prescription with you, before taking them home. Make sure that your name is accurately spelled, that your address is correct. Check the name and dose of your medication to make sure it is accurate. Check the number of pills, and the written instructions to make sure they are clear and accurate. Make sure that you are given enough medication to last until your next medication refill appointment. 5. Taking Medication: Take medication as prescribed. Never take more pills than instructed. Never take medication more frequently than prescribed. Taking less pills or less frequently is permitted and encouraged, when it comes to controlled substances (written prescriptions).  6. Inform other Doctors: Always inform, all of your healthcare providers, of all the medications you take. 7. Pain Medication from other Providers: You are not allowed to accept any additional pain medication from any other Doctor or Healthcare provider. There are two exceptions to this rule. (see below) In the event that you require additional pain medication, you are responsible for notifying us, as stated below. 8. Medication Agreement: You are responsible for carefully reading and following our Medication Agreement. This must be signed before receiving any prescriptions from our practice. Safely store a copy of your signed Agreement. Violations to the Agreement will result in no further prescriptions. (Additional copies of our Medication Agreement are available upon request.) 9. Laws, Rules, & Regulations: All patients are expected to follow all Federal and [employer], [employer], Rules, [employer]. Ignorance of  the Laws does not constitute a valid excuse. The use of any illegal substances is prohibited. 10. Adopted CDC guidelines & recommendations: Target dosing levels will be at or below 60 MME/day. Use of benzodiazepines** is not recommended.  Exceptions: There are only two exceptions to the rule of not receiving pain medications from other Healthcare Providers. 1. Exception #1 (Emergencies): In the event of an emergency (i.e.: accident  requiring emergency care), you are allowed to receive additional pain medication. However, you are responsible for: As soon as you are able, call our office [phone], at any time of the day or night, and leave a message stating your name, the date and nature of the emergency, and the name and dose of the medication prescribed. In the event that your call is answered by a member of our staff, make sure to document and save the date, time, and the name of the person that took your information.  2. Exception #2 (Planned Surgery): In the event that you are scheduled by another doctor or dentist to have any type of surgery or procedure, you are allowed (for a period no longer than 30 days), to receive additional pain medication, for the acute post-op pain. However, in this case, you are responsible for picking up a copy of our "Post-op Pain Management for Surgeons" handout, and giving it to your surgeon or dentist. This document is available at our office, and does not require an appointment to obtain it. Simply go to our office during business hours (Monday-Thursday from 8:00 AM to 4:00 PM) (Friday 8:00 AM to 12:00 Noon) or if you have a scheduled appointment with [place], prior to your surgery, and ask for it by name. In addition, you will need to provide [place] with your name, name of your surgeon, type of surgery, and date of procedure or surgery.  *Opioid medications include: morphine, codeine, oxycodone, oxymorphone, hydrocodone, hydromorphone, meperidine, tramadol, tapentadol,  buprenorphine, fentanyl, methadone. **Benzodiazepine medications include: diazepam (Valium), alprazolam (Xanax), clonazepam (Klonopine), lorazepam (Ativan), clorazepate (Tranxene), chlordiazepoxide (Librium), estazolam (Prosom), oxazepam (Serax), temazepam (Restoril), triazolam (Halcion)  ____________________________________________________________________________________________

## 2017-04-26 NOTE — Patient Instructions (Signed)

## 2017-04-26 NOTE — Progress Notes (Signed)
Nursing Pain Medication Assessment:  Safety precautions to be maintained throughout the outpatient stay will include: orient to surroundings, keep bed in low position, maintain call bell within reach at all times, provide assistance with transfer out of bed and ambulation.  Medication Inspection Compliance: Pill count conducted under aseptic conditions, in front of the patient. Neither the pills nor the bottle was removed from the patient's sight at any time. Once count was completed pills were immediately returned to the patient in their original bottle.  Medication #1: Morphine IR Pill/Patch Count: 86 of 120 pills remain Pill/Patch Appearance: Markings consistent with prescribed medication Bottle Appearance: Standard pharmacy container. Clearly labeled. Filled Date: 1 / 21 / 2019 Last Medication intake:  Today  Medication #2: Morphine ER (MSContin) Pill/Patch Count: 64 of 90 pills remain Pill/Patch Appearance: Markings consistent with prescribed medication Bottle Appearance: Standard pharmacy container. Clearly labeled. Filled Date: 1 / 21 / 2019 Last Medication intake:  Today

## 2017-06-01 ENCOUNTER — Encounter: Payer: Self-pay | Admitting: Nurse Practitioner

## 2017-06-01 ENCOUNTER — Ambulatory Visit: Payer: Worker's Compensation | Attending: Nurse Practitioner | Admitting: Nurse Practitioner

## 2017-06-01 ENCOUNTER — Other Ambulatory Visit: Payer: Self-pay

## 2017-06-01 VITALS — BP 138/78 | HR 64 | Temp 98.1°F | Resp 20 | Ht 70.0 in | Wt 190.0 lb

## 2017-06-01 DIAGNOSIS — G894 Chronic pain syndrome: Secondary | ICD-10-CM | POA: Diagnosis not present

## 2017-06-01 DIAGNOSIS — E785 Hyperlipidemia, unspecified: Secondary | ICD-10-CM | POA: Diagnosis not present

## 2017-06-01 DIAGNOSIS — M5137 Other intervertebral disc degeneration, lumbosacral region: Secondary | ICD-10-CM | POA: Diagnosis not present

## 2017-06-01 DIAGNOSIS — M961 Postlaminectomy syndrome, not elsewhere classified: Secondary | ICD-10-CM | POA: Diagnosis not present

## 2017-06-01 DIAGNOSIS — K219 Gastro-esophageal reflux disease without esophagitis: Secondary | ICD-10-CM | POA: Insufficient documentation

## 2017-06-01 DIAGNOSIS — Z981 Arthrodesis status: Secondary | ICD-10-CM | POA: Diagnosis not present

## 2017-06-01 DIAGNOSIS — Z8249 Family history of ischemic heart disease and other diseases of the circulatory system: Secondary | ICD-10-CM | POA: Diagnosis not present

## 2017-06-01 DIAGNOSIS — M79605 Pain in left leg: Secondary | ICD-10-CM | POA: Insufficient documentation

## 2017-06-01 DIAGNOSIS — Z789 Other specified health status: Secondary | ICD-10-CM | POA: Diagnosis not present

## 2017-06-01 DIAGNOSIS — Z87891 Personal history of nicotine dependence: Secondary | ICD-10-CM | POA: Insufficient documentation

## 2017-06-01 DIAGNOSIS — G8929 Other chronic pain: Secondary | ICD-10-CM

## 2017-06-01 DIAGNOSIS — Z79899 Other long term (current) drug therapy: Secondary | ICD-10-CM | POA: Diagnosis not present

## 2017-06-01 DIAGNOSIS — M47816 Spondylosis without myelopathy or radiculopathy, lumbar region: Secondary | ICD-10-CM | POA: Insufficient documentation

## 2017-06-01 DIAGNOSIS — Z79891 Long term (current) use of opiate analgesic: Secondary | ICD-10-CM | POA: Insufficient documentation

## 2017-06-01 DIAGNOSIS — M4310 Spondylolisthesis, site unspecified: Secondary | ICD-10-CM

## 2017-06-01 DIAGNOSIS — M545 Low back pain: Secondary | ICD-10-CM | POA: Diagnosis present

## 2017-06-01 MED ORDER — MORPHINE SULFATE 15 MG PO TABS: 15.0000 mg | ORAL_TABLET | Freq: Four times a day (QID) | ORAL | 0 refills | Status: DC | PRN

## 2017-06-01 MED ORDER — MORPHINE SULFATE ER 30 MG PO TBCR: 30.0000 mg | EXTENDED_RELEASE_TABLET | Freq: Three times a day (TID) | ORAL | 0 refills | Status: DC

## 2017-06-01 NOTE — Progress Notes (Signed)
Nursing Pain Medication Assessment:  Safety precautions to be maintained throughout the outpatient stay will include: orient to surroundings, keep bed in low position, maintain call bell within reach at all times, provide assistance with transfer out of bed and ambulation.  Medication Inspection Compliance: Pill count conducted under aseptic conditions, in front of the patient. Neither the pills nor the bottle was removed from the patient's sight at any time. Once count was completed pills were immediately returned to the patient in their original bottle.  Medication #1: Morphine IR 15mg  Pill/Patch Count: 63 of 120 pills remain Pill/Patch Appearance: Markings consistent with prescribed medication Bottle Appearance: Standard pharmacy container. Clearly labeled. Filled Date: 02 / 20 / 2019 Last Medication intake:  Today  Medication #2: Morphine ER (MSContin) 30 mg Pill/Patch Count: 47 of 90 pills remain Pill/Patch Appearance: Markings consistent with prescribed medication Bottle Appearance: Standard pharmacy container. Clearly labeled. Filled Date: 02 / 20 / 2019 Last Medication intake:  Today

## 2017-06-01 NOTE — Progress Notes (Signed)
Patient's Name: Lance Roman  MRN: [id number]  Referring Provider: Hoyt Koch, *  DOB: 01/04/1961  PCP: Hoyt Koch, MD  DOS: 06/01/2017  Note by: Vevelyn Francois NP  Service setting: Ambulatory outpatient  Specialty: Interventional Pain Management  Location: [facility] (AMB) Pain Management Facility    Patient type: Established    Primary Reason(s) for Visit: Encounter for prescription drug management. (Level of risk: moderate)  CC: Back Pain (low) and Leg Pain (left)  HPI  [name] is a 57 y.o. year old, male patient, who comes today for a medication management evaluation. He has GERD; Chronic low back pain (Primary Area of Pain) (Bilateral) (L>R); Pharmacologic therapy; Infected sebaceous cyst; Disorder of skeletal system; Problems influencing health status; Opiate use (150 MME/day); Long term prescription opiate use; Chronic pain syndrome; Muscle cramps; Long-term current use of opiate analgesic; Chronic lower extremity pain (Secondary Area of Pain) (Left); Chronic lumbar radicular pain (L5) (Left); Retrolisthesis (4-5 mm) (L3 over L4) (L2 over L3; Failed back surgical syndrome (Fusion L4-S1); Lumbar facet hypertrophy (Bilateral); Lumbar facet syndrome (Bilateral) (L>R); Spinal cord stimulator dysfunction (fractured leads); Lumbar facet osteoarthritis (Bilateral); and Osteoarthritis of lumbar spine on their problem list. His primarily concern today is the Back Pain (low) and Leg Pain (left)  Pain Assessment: Location: Lower Back Radiating: posterior left leg to heel Onset: More than a month ago Duration: Chronic pain Quality: Constant, Aching, Cramping Severity: 2 /10 (self-reported pain score)  Note: Reported level is compatible with observation.                            Timing: Constant Modifying factors: medications, activity, heat and ice with flare ups  [name] was last scheduled for an appointment on 04/26/2017 for medication management. During today's  appointment we reviewed [name]'s chronic pain status, as well as his outpatient medication regimen. He states that he states the has not heard back form Dr Geralynn Ochs office about the removal of the SCS. The goal was to have this removed and to taper down on his medication secondary to his current MME is 150 vs 60. He admits that he has tried to taper down of the Morphine ER 53m however he states by 5pm that he is hurting. He does continue to work daily. He admits that he tries to use less of the Morphine 134m He states that he has had to come off medication Fentanyl in the past. He admits it was miserable.   The patient  reports that he does not use drugs. His body mass index is 27.26 kg/m.  Further details on both, my assessment(s), as well as the proposed treatment plan, please see below.  Controlled Substance Pharmacotherapy Assessment REMS (Risk Evaluation and Mitigation Strategy)  Analgesic:Morphine ER 30 mg every 8 hours + morphine IR 15 mg every 6 hours (150MME/day) MME/day: [id number]   ShaHart RochesterN  06/01/2017  9:05 AM  Sign at close encounter Nursing Pain Medication Assessment:  Safety precautions to be maintained throughout the outpatient stay will include: orient to surroundings, keep bed in low position, maintain call bell within reach at all times, provide assistance with transfer out of bed and ambulation.  Medication Inspection Compliance: Pill count conducted under aseptic conditions, in front of the patient. Neither the pills nor the bottle was removed from the patient's sight at any time. Once count was completed pills were immediately returned to the patient in their original  bottle.  Medication #1: Morphine IR 58m Pill/Patch Count: 63 of 120 pills remain Pill/Patch Appearance: Markings consistent with prescribed medication Bottle Appearance: Standard pharmacy container. Clearly labeled. Filled Date: 02 / 20 / 2019 Last Medication intake:   Today  Medication #2: Morphine ER (MSContin) 30 mg Pill/Patch Count: 47 of 90 pills remain Pill/Patch Appearance: Markings consistent with prescribed medication Bottle Appearance: Standard pharmacy container. Clearly labeled. Filled Date: 02 / 20 / 2019 Last Medication intake:  Today   Pharmacokinetics: Liberation and absorption (onset of action): WNL Distribution (time to peak effect): WNL Metabolism and excretion (duration of action): WNL         Pharmacodynamics: Desired effects: Analgesia: Mr. PGilbergreports >50% benefit. Functional ability: Patient reports that medication allows him to accomplish basic ADLs Clinically meaningful improvement in function (CMIF): Sustained CMIF goals met Perceived effectiveness: Described as relatively effective, allowing for increase in activities of daily living (ADL) Undesirable effects: Side-effects or Adverse reactions: None reported Monitoring: [place] PMP: Online review of the past 162-montheriod conducted. Compliant with practice rules and regulations Last UDS on record: Summary  Date Value Ref Range Status  01/30/2017 FINAL  Final    Comment:    ==================================================================== TOXASSURE SELECT 13 (MW) ==================================================================== Test                             Result       Flag       Units Drug Present and Declared for Prescription Verification   Morphine                       >20000       EXPECTED   ng/mg creat   Normorphine                    1152         EXPECTED   ng/mg creat    Potential sources of large amounts of morphine in the absence of    codeine include administration of morphine or use of heroin.    Normorphine is an expected metabolite of morphine.   Hydromorphone                  318          EXPECTED   ng/mg creat    Hydromorphone may be present as a metabolite of morphine;    concentrations of hydromorphone rarely exceed 5% of the morphine     concentration when this is the source of hydromorphone. ==================================================================== Test                      Result    Flag   Units      Ref Range   Creatinine              50               mg/dL      >=20 ==================================================================== Declared Medications:  The flagging and interpretation on this report are based on the  following declared medications.  Unexpected results may arise from  inaccuracies in the declared medications.  **Note: The testing scope of this panel includes these medications:  Morphine (MS Contin)  Morphine (MSIR)  **Note: The testing scope of this panel does not include following  reported medications:  Celecoxib (Celebrex)  Multivitamin  Olopatadine (patanol)  Omeprazole (Nexium) ==================================================================== For clinical consultation, please  call [phone]. ====================================================================    UDS interpretation: Compliant          Medication Assessment Form: Reviewed. Patient indicates being compliant with therapy Treatment compliance: Compliant Risk Assessment Profile: Aberrant behavior: See prior evaluations. None observed or detected today Comorbid factors increasing risk of overdose: See prior notes. No additional risks detected today Risk of substance use disorder (SUD): Low Opioid Risk Tool - 04/26/17 0902      Family History of Substance Abuse   Alcohol  Negative    Illegal Drugs  Negative    Rx Drugs  Negative      Personal History of Substance Abuse   Alcohol  Negative    Illegal Drugs  Negative    Rx Drugs  Negative      Age   Age between 48-45 years   No      History of Preadolescent Sexual Abuse   History of Preadolescent Sexual Abuse  Negative or Male      Psychological Disease   Psychological Disease  Negative    Depression  Negative      Total Score   Opioid Risk  Tool Scoring  0    Opioid Risk Interpretation  Low Risk      ORT Scoring interpretation table:  Score <3 = Low Risk for SUD  Score between 4-7 = Moderate Risk for SUD  Score >8 = High Risk for Opioid Abuse   Risk Mitigation Strategies:  Patient Counseling: Covered Patient-Prescriber Agreement (PPA): Present and active  Notification to other healthcare providers: Done  Pharmacologic Plan: No change in therapy, at this time.             Laboratory Chemistry  Inflammation Markers (CRP: Acute Phase) (ESR: Chronic Phase) Lab Results  Component Value Date   CRP 2.5 12/12/2016   ESRSEDRATE 10 12/12/2016                         Rheumatology Markers Lab Results  Component Value Date   LABURIC 6.4 08/11/2008                Renal Function Markers Lab Results  Component Value Date   BUN 20 12/12/2016   CREATININE 0.74 (L) 12/12/2016   GFRAA 120 12/12/2016   GFRNONAA 104 12/12/2016                 Hepatic Function Markers Lab Results  Component Value Date   AST 21 12/12/2016   ALT 12 12/30/2014   ALBUMIN 4.5 12/12/2016   ALKPHOS 66 12/12/2016   HCVAB NEGATIVE 12/30/2014                 Electrolytes Lab Results  Component Value Date   NA 140 12/12/2016   K 4.5 12/12/2016   CL 99 12/12/2016   CALCIUM 9.7 12/12/2016   MG 1.9 12/12/2016                        Neuropathy Markers Lab Results  Component Value Date   VITAMINB12 632 12/12/2016                 Bone Pathology Markers Lab Results  Component Value Date   25OHVITD1 44 12/12/2016   25OHVITD2 <1.0 12/12/2016   25OHVITD3 43 12/12/2016                         Coagulation Parameters Lab Results  Component Value Date   PLT 167.0 03/03/2011                 Cardiovascular Markers Lab Results  Component Value Date   HGB 13.1 03/03/2011   HCT 38.5 (L) 03/03/2011                 CA Markers No results found for: CEA, CA125, LABCA2               Note: Lab results reviewed.  Recent Diagnostic  Imaging Results  DG C-Arm 1-60 Min-No Report Fluoroscopy was utilized by the requesting physician.  No radiographic  interpretation.   Complexity Note: Imaging results reviewed. Results shared with Mr. Jerkins, using Layman's terms.                         Meds   Current Outpatient Medications:  .  celecoxib (CELEBREX) 200 MG capsule, Take 1 capsule (200 mg total) by mouth 2 (two) times daily., Disp: 180 capsule, Rfl: 0 .  docusate sodium (COLACE) 100 MG capsule, Take 200 mg by mouth daily., Disp: , Rfl:  .  esomeprazole (NEXIUM) 40 MG capsule, Take 1 capsule (40 mg total) by mouth 2 (two) times daily before a meal., Disp: 180 capsule, Rfl: 0 .  [START ON 06/16/2017] morphine (MS CONTIN) 30 MG 12 hr tablet, Take 1 tablet (30 mg total) by mouth every 8 (eight) hours., Disp: 90 tablet, Rfl: 0 .  [START ON 06/16/2017] morphine (MSIR) 15 MG tablet, Take 1 tablet (15 mg total) by mouth every 6 (six) hours as needed for severe pain., Disp: 120 tablet, Rfl: 0 .  Multiple Vitamin (MULTIVITAMIN) capsule, Take 1 capsule by mouth daily., Disp: , Rfl:  .  olopatadine (PATANOL) 0.1 % ophthalmic solution, Place 1 drop into both eyes as needed. , Disp: , Rfl:   ROS  Constitutional: Denies any fever or chills Gastrointestinal: No reported hemesis, hematochezia, vomiting, or acute GI distress Musculoskeletal: Denies any acute onset joint swelling, redness, loss of ROM, or weakness Neurological: No reported episodes of acute onset apraxia, aphasia, dysarthria, agnosia, amnesia, paralysis, loss of coordination, or loss of consciousness  Allergies  Mr. Babich has No Known Allergies.  [place]  Drug: Mr. Boeding  reports that he does not use drugs. Alcohol:  reports that he drinks about 1.8 oz of alcohol per week. Tobacco:  reports that he has quit smoking. His smokeless tobacco use includes chew. Medical:  has a past medical history of Allergic rhinitis, Chronic pain syndrome, DDD (degenerative disc disease),  lumbosacral, GERD (gastroesophageal reflux disease), Hyperlipidemia, Osteoarthritis, and S/P appendectomy. Surgical: Mr. Congrove  has a past surgical history that includes Appendectomy; repairs of bilateral ankle fractures; Spinal fusion (N/A); and Spinal cord stimulator implant (N/A). Family: family history includes Diabetes in his maternal grandmother; Heart disease in his father.  Constitutional Exam  General appearance: Well nourished, well developed, and well hydrated. In no apparent acute distress Vitals:   06/01/17 0837  BP: 138/78  Pulse: 64  Resp: 20  Temp: 98.1 F (36.7 C)  TempSrc: Oral  SpO2: 100%  Weight: 190 lb (86.2 kg)  Height: 5' 10"  (1.778 m)   BMI Assessment: Estimated body mass index is 27.26 kg/m as calculated from the following:   Height as of this encounter: 5' 10"  (1.778 m).   Weight as of this encounter: 190 lb (86.2 kg).  Psych/Mental status: Alert, oriented x 3 (person, place, & time)  Eyes: PERLA Respiratory: No evidence of acute respiratory distress  Cervical Spine Area Exam  Skin & Axial Inspection: No masses, redness, edema, swelling, or associated skin lesions Alignment: Symmetrical Functional ROM: Unrestricted ROM      Stability: No instability detected Muscle Tone/Strength: Functionally intact. No obvious neuro-muscular anomalies detected. Sensory (Neurological): Unimpaired Palpation: No palpable anomalies              Upper Extremity (UE) Exam    Side: Right upper extremity  Side: Left upper extremity  Skin & Extremity Inspection: Skin color, temperature, and hair growth are WNL. No peripheral edema or cyanosis. No masses, redness, swelling, asymmetry, or associated skin lesions. No contractures.  Skin & Extremity Inspection: Skin color, temperature, and hair growth are WNL. No peripheral edema or cyanosis. No masses, redness, swelling, asymmetry, or associated skin lesions. No contractures.  Functional ROM: Unrestricted ROM           Functional ROM: Unrestricted ROM          Muscle Tone/Strength: Functionally intact. No obvious neuro-muscular anomalies detected.  Muscle Tone/Strength: Functionally intact. No obvious neuro-muscular anomalies detected.  Sensory (Neurological): Unimpaired          Sensory (Neurological): Unimpaired          Palpation: No palpable anomalies              Palpation: No palpable anomalies              Specialized Test(s): Deferred         Specialized Test(s): Deferred          Thoracic Spine Area Exam  Skin & Axial Inspection: No masses, redness, or swelling Alignment: Symmetrical Functional ROM: Unrestricted ROM Stability: No instability detected Muscle Tone/Strength: Functionally intact. No obvious neuro-muscular anomalies detected. Sensory (Neurological): Unimpaired Muscle strength & Tone: No palpable anomalies  Lumbar Spine Area Exam  Skin & Axial Inspection: Well healed scar from previous spine surgery detected Alignment: Symmetrical Functional ROM: Unrestricted ROM      Stability: No instability detected Muscle Tone/Strength: Functionally intact. No obvious neuro-muscular anomalies detected. Sensory (Neurological): Unimpaired Palpation: No palpable anomalies       Provocative Tests: Lumbar Hyperextension and rotation test: evaluation deferred today       Lumbar Lateral bending test: evaluation deferred today       Patrick's Maneuver: evaluation deferred today                    Gait & Posture Assessment  Ambulation: Unassisted Gait: Relatively normal for age and body habitus Posture: WNL   Lower Extremity Exam    Side: Right lower extremity  Side: Left lower extremity  Skin & Extremity Inspection: Skin color, temperature, and hair growth are WNL. No peripheral edema or cyanosis. No masses, redness, swelling, asymmetry, or associated skin lesions. No contractures.  Skin & Extremity Inspection: Skin color, temperature, and hair growth are WNL. No peripheral edema or cyanosis. No  masses, redness, swelling, asymmetry, or associated skin lesions. No contractures.  Functional ROM: Unrestricted ROM          Functional ROM: Unrestricted ROM          Muscle Tone/Strength: Functionally intact. No obvious neuro-muscular anomalies detected.  Muscle Tone/Strength: Functionally intact. No obvious neuro-muscular anomalies detected.  Sensory (Neurological): Unimpaired  Sensory (Neurological): Unimpaired  Palpation: No palpable anomalies  Palpation: No palpable anomalies   Assessment  Primary Diagnosis & Pertinent Problem List: The primary encounter diagnosis was Lumbar facet  osteoarthritis (Bilateral). Diagnoses of Retrolisthesis (4-5 mm) (L3 over L4) (L2 over L3, Lumbar facet hypertrophy (Bilateral), Chronic lower extremity pain (Secondary Area of Pain) (Left), and Chronic pain syndrome were also pertinent to this visit.  Status Diagnosis  Controlled Controlled Controlled 1. Lumbar facet osteoarthritis (Bilateral)   2. Retrolisthesis (4-5 mm) (L3 over L4) (L2 over L3   3. Lumbar facet hypertrophy (Bilateral)   4. Chronic lower extremity pain (Secondary Area of Pain) (Left)   5. Chronic pain syndrome     Problems updated and reviewed during this visit: No problems updated. Plan of Care  Pharmacotherapy (Medications Ordered): Meds ordered this encounter  Medications  . morphine (MS CONTIN) 30 MG 12 hr tablet    Sig: Take 1 tablet (30 mg total) by mouth every 8 (eight) hours.    Dispense:  90 tablet    Refill:  0    Fill one day early if pharmacy is closed on scheduled refill date. Do not fill until: 06/16/2017 To last until:07/16/2017    Order Specific Question:   Supervising Provider    Answer:   Milinda Pointer [phone]  . morphine (MSIR) 15 MG tablet    Sig: Take 1 tablet (15 mg total) by mouth every 6 (six) hours as needed for severe pain.    Dispense:  120 tablet    Refill:  0    Fill one day early if pharmacy is closed on scheduled refill date. Do not fill  until: 3/22/2019To last until:07/16/2017    Order Specific Question:   Supervising Provider    Answer:   Milinda Pointer [[id number]]   New Prescriptions   No medications on file   Medications administered today: Lesean [place]. [place] had no medications administered during this visit. Lab-work, procedure(s), and/or referral(s): No orders of the defined types were placed in this encounter.  Imaging and/or referral(s): None  Interventional therapies: Planned, scheduled, and/or pending: Not at this time. Encouraged pt to try to taper or use less to help with future tapering. He will contact Dr Geralynn Ochs office.    Considering: Removal of nonfunctional spinal cord stimulatorfollowed by an MRI of the lumbar spine with and without contrast. Diagnostic bilateral lumbar facet block Possible bilateral lumbar facet RFA Diagnostic caudal epidural steroid injection plus diagnostic epidurogram Possible Racz procedure   PRN Procedures: None at this time      Provider-requested follow-up: Return in 6 weeks (on 07/11/2017).  Future Appointments  Date Time Provider [place]  07/11/2017  8:45 AM Vevelyn Francois, NP [facility] None   Primary Care Physician: Hoyt Koch, MD Location: [facility] Outpatient Pain Management Facility Note by: Vevelyn Francois NP Date: 06/01/2017; Time: 9:29 AM  Pain Score Disclaimer: We use the NRS-11 scale. This is a self-reported, subjective measurement of pain severity with only modest accuracy. It is used primarily to identify changes within a particular patient. It must be understood that outpatient pain scales are significantly less accurate that those used for research, where they can be applied under ideal controlled circumstances with minimal exposure to variables. In reality, the score is likely to be a combination of pain intensity and pain affect, where pain affect describes the degree of emotional arousal or changes in action readiness  caused by the sensory experience of pain. Factors such as social and work situation, setting, emotional state, anxiety levels, expectation, and prior pain experience may influence pain perception and show large inter-individual differences that may also be affected by time variables.  Patient instructions  provided during this appointment: Patient Instructions  ____________________________________________________________________________________________  Medication Rules  Applies to: All patients receiving prescriptions (written or electronic).  Pharmacy of record: Pharmacy where electronic prescriptions will be sent. If written prescriptions are taken to a different pharmacy, please inform the nursing staff. The pharmacy listed in the electronic medical record should be the one where you would like electronic prescriptions to be sent.  Prescription refills: Only during scheduled appointments. Applies to both, written and electronic prescriptions.  NOTE: The following applies primarily to controlled substances (Opioid* Pain Medications).   Patient's responsibilities: 1. Pain Pills: Bring all pain pills to every appointment (except for procedure appointments). 2. Pill Bottles: Bring pills in original pharmacy bottle. Always bring newest bottle. Bring bottle, even if empty. 3. Medication refills: You are responsible for knowing and keeping track of what medications you need refilled. The day before your appointment, write a list of all prescriptions that need to be refilled. Bring that list to your appointment and give it to the admitting nurse. Prescriptions will be written only during appointments. If you forget a medication, it will not be "Called in", "Faxed", or "electronically sent". You will need to get another appointment to get these prescribed. 4. Prescription Accuracy: You are responsible for carefully inspecting your prescriptions before leaving our office. Have the discharge nurse  carefully go over each prescription with you, before taking them home. Make sure that your name is accurately spelled, that your address is correct. Check the name and dose of your medication to make sure it is accurate. Check the number of pills, and the written instructions to make sure they are clear and accurate. Make sure that you are given enough medication to last until your next medication refill appointment. 5. Taking Medication: Take medication as prescribed. Never take more pills than instructed. Never take medication more frequently than prescribed. Taking less pills or less frequently is permitted and encouraged, when it comes to controlled substances (written prescriptions).  6. Inform other Doctors: Always inform, all of your healthcare providers, of all the medications you take. 7. Pain Medication from other Providers: You are not allowed to accept any additional pain medication from any other Doctor or Healthcare provider. There are two exceptions to this rule. (see below) In the event that you require additional pain medication, you are responsible for notifying us, as stated below. 8. Medication Agreement: You are responsible for carefully reading and following our Medication Agreement. This must be signed before receiving any prescriptions from our practice. Safely store a copy of your signed Agreement. Violations to the Agreement will result in no further prescriptions. (Additional copies of our Medication Agreement are available upon request.) 9. Laws, Rules, & Regulations: All patients are expected to follow all Federal and [employer], [employer], Rules, [employer]. Ignorance of the Laws does not constitute a valid excuse. The use of any illegal substances is prohibited. 10. Adopted CDC guidelines & recommendations: Target dosing levels will be at or below 60 MME/day. Use of benzodiazepines** is not recommended.  Exceptions: There are only two exceptions to the rule of not receiving  pain medications from other Healthcare Providers. 1. Exception #1 (Emergencies): In the event of an emergency (i.e.: accident requiring emergency care), you are allowed to receive additional pain medication. However, you are responsible for: As soon as you are able, call our office [phone], at any time of the day or night, and leave a message stating your name, the date and nature of the emergency, and  the name and dose of the medication prescribed. In the event that your call is answered by a member of our staff, make sure to document and save the date, time, and the name of the person that took your information.  2. Exception #2 (Planned Surgery): In the event that you are scheduled by another doctor or dentist to have any type of surgery or procedure, you are allowed (for a period no longer than 30 days), to receive additional pain medication, for the acute post-op pain. However, in this case, you are responsible for picking up a copy of our "Post-op Pain Management for Surgeons" handout, and giving it to your surgeon or dentist. This document is available at our office, and does not require an appointment to obtain it. Simply go to our office during business hours (Monday-Thursday from 8:00 AM to 4:00 PM) (Friday 8:00 AM to 12:00 Noon) or if you have a scheduled appointment with [place], prior to your surgery, and ask for it by name. In addition, you will need to provide [place] with your name, name of your surgeon, type of surgery, and date of procedure or surgery.  *Opioid medications include: morphine, codeine, oxycodone, oxymorphone, hydrocodone, hydromorphone, meperidine, tramadol, tapentadol, buprenorphine, fentanyl, methadone. **Benzodiazepine medications include: diazepam (Valium), alprazolam (Xanax), clonazepam (Klonopine), lorazepam (Ativan), clorazepate (Tranxene), chlordiazepoxide (Librium), estazolam (Prosom), oxazepam (Serax), temazepam (Restoril), triazolam (Halcion) (Last updated:  05/25/2017) ____________________________________________________________________________________________

## 2017-06-01 NOTE — Patient Instructions (Signed)
____________________________________________________________________________________________  Medication Rules  Applies to: All patients receiving prescriptions (written or electronic).  Pharmacy of record: Pharmacy where electronic prescriptions will be sent. If written prescriptions are taken to a different pharmacy, please inform the nursing staff. The pharmacy listed in the electronic medical record should be the one where you would like electronic prescriptions to be sent.  Prescription refills: Only during scheduled appointments. Applies to both, written and electronic prescriptions.  NOTE: The following applies primarily to controlled substances (Opioid* Pain Medications).   Patient's responsibilities: 1. Pain Pills: Bring all pain pills to every appointment (except for procedure appointments). 2. Pill Bottles: Bring pills in original pharmacy bottle. Always bring newest bottle. Bring bottle, even if empty. 3. Medication refills: You are responsible for knowing and keeping track of what medications you need refilled. The day before your appointment, write a list of all prescriptions that need to be refilled. Bring that list to your appointment and give it to the admitting nurse. Prescriptions will be written only during appointments. If you forget a medication, it will not be "Called in", "Faxed", or "electronically sent". You will need to get another appointment to get these prescribed. 4. Prescription Accuracy: You are responsible for carefully inspecting your prescriptions before leaving our office. Have the discharge nurse carefully go over each prescription with you, before taking them home. Make sure that your name is accurately spelled, that your address is correct. Check the name and dose of your medication to make sure it is accurate. Check the number of pills, and the written instructions to make sure they are clear and accurate. Make sure that you are given enough medication to last  until your next medication refill appointment. 5. Taking Medication: Take medication as prescribed. Never take more pills than instructed. Never take medication more frequently than prescribed. Taking less pills or less frequently is permitted and encouraged, when it comes to controlled substances (written prescriptions).  6. Inform other Doctors: Always inform, all of your healthcare providers, of all the medications you take. 7. Pain Medication from other Providers: You are not allowed to accept any additional pain medication from any other Doctor or Healthcare provider. There are two exceptions to this rule. (see below) In the event that you require additional pain medication, you are responsible for notifying us, as stated below. 8. Medication Agreement: You are responsible for carefully reading and following our Medication Agreement. This must be signed before receiving any prescriptions from our practice. Safely store a copy of your signed Agreement. Violations to the Agreement will result in no further prescriptions. (Additional copies of our Medication Agreement are available upon request.) 9. Laws, Rules, & Regulations: All patients are expected to follow all Federal and State Laws, Statutes, Rules, & Regulations. Ignorance of the Laws does not constitute a valid excuse. The use of any illegal substances is prohibited. 10. Adopted CDC guidelines & recommendations: Target dosing levels will be at or below 60 MME/day. Use of benzodiazepines** is not recommended.  Exceptions: There are only two exceptions to the rule of not receiving pain medications from other Healthcare Providers. 1. Exception #1 (Emergencies): In the event of an emergency (i.e.: accident requiring emergency care), you are allowed to receive additional pain medication. However, you are responsible for: As soon as you are able, call our office [phone], at any time of the day or night, and leave a message stating your name, the  date and nature of the emergency, and the name and dose of the medication   prescribed. In the event that your call is answered by a member of our staff, make sure to document and save the date, time, and the name of the person that took your information.  2. Exception #2 (Planned Surgery): In the event that you are scheduled by another doctor or dentist to have any type of surgery or procedure, you are allowed (for a period no longer than 30 days), to receive additional pain medication, for the acute post-op pain. However, in this case, you are responsible for picking up a copy of our "Post-op Pain Management for Surgeons" handout, and giving it to your surgeon or dentist. This document is available at our office, and does not require an appointment to obtain it. Simply go to our office during business hours (Monday-Thursday from 8:00 AM to 4:00 PM) (Friday 8:00 AM to 12:00 Noon) or if you have a scheduled appointment with us, prior to your surgery, and ask for it by name. In addition, you will need to provide us with your name, name of your surgeon, type of surgery, and date of procedure or surgery.  *Opioid medications include: morphine, codeine, oxycodone, oxymorphone, hydrocodone, hydromorphone, meperidine, tramadol, tapentadol, buprenorphine, fentanyl, methadone. **Benzodiazepine medications include: diazepam (Valium), alprazolam (Xanax), clonazepam (Klonopine), lorazepam (Ativan), clorazepate (Tranxene), chlordiazepoxide (Librium), estazolam (Prosom), oxazepam (Serax), temazepam (Restoril), triazolam (Halcion) (Last updated: 05/25/2017) ____________________________________________________________________________________________    

## 2017-07-10 ENCOUNTER — Encounter: Payer: Worker's Compensation | Admitting: Nurse Practitioner

## 2017-07-10 ENCOUNTER — Telehealth: Payer: Self-pay | Admitting: Pain Medicine

## 2017-07-10 NOTE — Telephone Encounter (Signed)
Called [facility] set up for conference call on April 25 3:30   Olegario MessierKathy please place this appt on my schedule With this code information below  thanks  [phone] Id: [id number]

## 2017-07-10 NOTE — Telephone Encounter (Signed)
We don't do this. Sorry

## 2017-07-10 NOTE — Telephone Encounter (Signed)
Terese Doorolby from [facility] called about patient Lance PastorSamuel Scheer. Wants to set up peer to peer with Dr. Laban EmperorNaveira regarding medications Please call   Terese DoorColby [phone]

## 2017-07-11 ENCOUNTER — Encounter: Payer: Worker's Compensation | Admitting: Nurse Practitioner

## 2017-07-12 ENCOUNTER — Ambulatory Visit: Payer: No Typology Code available for payment source | Attending: Nurse Practitioner | Admitting: Nurse Practitioner

## 2017-07-12 ENCOUNTER — Other Ambulatory Visit: Payer: Self-pay

## 2017-07-12 ENCOUNTER — Encounter: Payer: Self-pay | Admitting: Nurse Practitioner

## 2017-07-12 VITALS — BP 123/87 | HR 60 | Temp 98.1°F | Ht 70.0 in | Wt 190.0 lb

## 2017-07-12 DIAGNOSIS — I10 Essential (primary) hypertension: Secondary | ICD-10-CM | POA: Diagnosis not present

## 2017-07-12 DIAGNOSIS — M545 Low back pain: Secondary | ICD-10-CM | POA: Diagnosis present

## 2017-07-12 DIAGNOSIS — Z79891 Long term (current) use of opiate analgesic: Secondary | ICD-10-CM | POA: Diagnosis not present

## 2017-07-12 DIAGNOSIS — Z8249 Family history of ischemic heart disease and other diseases of the circulatory system: Secondary | ICD-10-CM | POA: Diagnosis not present

## 2017-07-12 DIAGNOSIS — Z87891 Personal history of nicotine dependence: Secondary | ICD-10-CM | POA: Diagnosis present

## 2017-07-12 DIAGNOSIS — K219 Gastro-esophageal reflux disease without esophagitis: Secondary | ICD-10-CM | POA: Insufficient documentation

## 2017-07-12 DIAGNOSIS — Z981 Arthrodesis status: Secondary | ICD-10-CM | POA: Insufficient documentation

## 2017-07-12 DIAGNOSIS — M4726 Other spondylosis with radiculopathy, lumbar region: Secondary | ICD-10-CM | POA: Insufficient documentation

## 2017-07-12 DIAGNOSIS — E785 Hyperlipidemia, unspecified: Secondary | ICD-10-CM | POA: Diagnosis not present

## 2017-07-12 DIAGNOSIS — M5137 Other intervertebral disc degeneration, lumbosacral region: Secondary | ICD-10-CM | POA: Insufficient documentation

## 2017-07-12 DIAGNOSIS — G8929 Other chronic pain: Secondary | ICD-10-CM

## 2017-07-12 DIAGNOSIS — M5442 Lumbago with sciatica, left side: Secondary | ICD-10-CM

## 2017-07-12 DIAGNOSIS — G894 Chronic pain syndrome: Secondary | ICD-10-CM | POA: Diagnosis not present

## 2017-07-12 DIAGNOSIS — Z79899 Other long term (current) drug therapy: Secondary | ICD-10-CM | POA: Insufficient documentation

## 2017-07-12 DIAGNOSIS — M961 Postlaminectomy syndrome, not elsewhere classified: Secondary | ICD-10-CM | POA: Insufficient documentation

## 2017-07-12 DIAGNOSIS — M5410 Radiculopathy, site unspecified: Secondary | ICD-10-CM

## 2017-07-12 DIAGNOSIS — M47816 Spondylosis without myelopathy or radiculopathy, lumbar region: Secondary | ICD-10-CM

## 2017-07-12 DIAGNOSIS — H40023 Open angle with borderline findings, high risk, bilateral: Secondary | ICD-10-CM | POA: Diagnosis not present

## 2017-07-12 MED ORDER — MORPHINE SULFATE ER 30 MG PO TBCR: 30.0000 mg | EXTENDED_RELEASE_TABLET | Freq: Three times a day (TID) | ORAL | 0 refills | Status: DC

## 2017-07-12 NOTE — Progress Notes (Signed)
Patient's Name: Lance Roman  MRN: [id number]  Referring Provider: Hoyt Koch, *  DOB: 1960/11/01  PCP: Hoyt Koch, MD  DOS: 07/12/2017  Note by: Vevelyn Francois NP  Service setting: Ambulatory outpatient  Specialty: Interventional Pain Management  Location: [facility] (AMB) Pain Management Facility    Patient type: Established    Primary Reason(s) for Visit: Encounter for prescription drug management. (Level of risk: moderate)  CC: Back Pain (lower)  HPI  Lance Roman is a 57 y.o. year old, male patient, who comes today for a medication management evaluation. He has GERD; Chronic low back pain (Primary Area of Pain) (Bilateral) (L>R); Pharmacologic therapy; Infected sebaceous cyst; Disorder of skeletal system; Problems influencing health status; Opiate use (150 MME/day); Long term prescription opiate use; Chronic pain syndrome; Muscle cramps; Long-term current use of opiate analgesic; Chronic lower extremity pain (Secondary Area of Pain) (Left); Chronic lumbar radicular pain (L5) (Left); Retrolisthesis (4-5 mm) (L3 over L4) (L2 over L3; Failed back surgical syndrome (Fusion L4-S1); Lumbar facet hypertrophy (Bilateral); Lumbar facet syndrome (Bilateral) (L>R); Spinal cord stimulator dysfunction (fractured leads); Lumbar facet osteoarthritis (Bilateral); and Osteoarthritis of lumbar spine on their problem list. His primarily concern today is the Back Pain (lower)  Pain Assessment: Location: Lower Back Radiating: radiates down left leg to heel Onset: More than a month ago Duration: Chronic pain Quality: Aching, Cramping, Constant Severity: 2 /10 (self-reported pain score)  Note: Reported level is compatible with observation.                          Timing: Constant Modifying factors: move around, meds  Lance Roman was last scheduled for an appointment on 06/01/2017 for medication management. During today's appointment we reviewed Lance Roman's chronic pain status, as well as his  outpatient medication regimen. He admits that he is has started to trying to taper his Morphine. He admits that he has started with the Morphine Sulfate IR 39m BID.   He admits that at this dose he is doing ok.He is aware that the goal is to adjust his dose. He admits that hehad  withdrawals when he was on the Fentanyl patch and it was horrible. He states that he has failed Gabapentin secondary to the side effects.   The patient  reports that he does not use drugs. His body mass index is 27.26 kg/m.  Further details on both, my assessment(s), as well as the proposed treatment plan, please see below.  Controlled Substance Pharmacotherapy Assessment REMS (Risk Evaluation and Mitigation Strategy)  Analgesic:Morphine ER 30 mg every 8 hours + morphine IR 15 mg every 6 hours (150MME/day) MME/day: 1523mday   BrChauncey FischerRN  07/12/2017  9:19 AM  Sign at close encounter Nursing Pain Medication Assessment:  Safety precautions to be maintained throughout the outpatient stay will include: orient to surroundings, keep bed in low position, maintain call bell within reach at all times, provide assistance with transfer out of bed and ambulation.  Medication Inspection Compliance: Pill count conducted under aseptic conditions, in front of the patient. Neither the pills nor the bottle was removed from the patient's sight at any time. Once count was completed pills were immediately returned to the patient in their original bottle.  Medication #1: Oxycodone ER (OxyContin) Pill/Patch Count: 25 of 90 pills remain Pill/Patch Appearance: Markings consistent with prescribed medication Bottle Appearance: Standard pharmacy container. Clearly labeled. Filled Date: 03 / 26 / 2019 Last Medication intake:  Today  Medication #2: Oxycodone IR Pill/Patch Count: 76 of 120 pills remain Pill/Patch Appearance: Markings consistent with prescribed medication Bottle Appearance: Standard pharmacy container. Clearly  labeled. Filled Date: 03 / 26 / 2019 Last Medication intake:  Today   Pharmacokinetics: Liberation and absorption (onset of action): WNL Distribution (time to peak effect): WNL Metabolism and excretion (duration of action): WNL         Pharmacodynamics: Desired effects: Analgesia: Lance Roman reports >50% benefit. Functional ability: Patient reports that medication allows him to accomplish basic ADLs Clinically meaningful improvement in function (CMIF): Sustained CMIF goals met Perceived effectiveness: Described as relatively effective, allowing for increase in activities of daily living (ADL) Undesirable effects: Side-effects or Adverse reactions: None reported Monitoring: [place] PMP: Online review of the past 21-monthperiod conducted. Compliant with practice rules and regulations Last UDS on record: Summary  Date Value Ref Range Status  01/30/2017 FINAL  Final    Comment:    ==================================================================== TOXASSURE SELECT 13 (MW) ==================================================================== Test                             Result       Flag       Units Drug Present and Declared for Prescription Verification   Morphine                       >20000       EXPECTED   ng/mg creat   Normorphine                    1152         EXPECTED   ng/mg creat    Potential sources of large amounts of morphine in the absence of    codeine include administration of morphine or use of heroin.    Normorphine is an expected metabolite of morphine.   Hydromorphone                  318          EXPECTED   ng/mg creat    Hydromorphone may be present as a metabolite of morphine;    concentrations of hydromorphone rarely exceed 5% of the morphine    concentration when this is the source of hydromorphone. ==================================================================== Test                      Result    Flag   Units      Ref Range   Creatinine              50                mg/dL      >=20 ==================================================================== Declared Medications:  The flagging and interpretation on this report are based on the  following declared medications.  Unexpected results may arise from  inaccuracies in the declared medications.  **Note: The testing scope of this panel includes these medications:  Morphine (MS Contin)  Morphine (MSIR)  **Note: The testing scope of this panel does not include following  reported medications:  Celecoxib (Celebrex)  Multivitamin  Olopatadine (patanol)  Omeprazole (Nexium) ==================================================================== For clinical consultation, please call [phone] ====================================================================    UDS interpretation: Compliant          Medication Assessment Form: Reviewed. Patient indicates being compliant with therapy Treatment compliance: Compliant Risk Assessment Profile: Aberrant behavior: See prior evaluations. None observed or detected  today Comorbid factors increasing risk of overdose: See prior notes. No additional risks detected today Risk of substance use disorder (SUD): Low  ORT Scoring interpretation table:  Score <3 = Low Risk for SUD  Score between 4-7 = Moderate Risk for SUD  Score >8 = High Risk for Opioid Abuse   Risk Mitigation Strategies:  Patient Counseling: Covered Patient-Prescriber Agreement (PPA): Present and active  Notification to other healthcare providers: Done  Pharmacologic Plan: No change in therapy, at this time.             Laboratory Chemistry  Inflammation Markers (CRP: Acute Phase) (ESR: Chronic Phase) Lab Results  Component Value Date   CRP 2.5 12/12/2016   ESRSEDRATE 10 12/12/2016                         Rheumatology Markers Lab Results  Component Value Date   LABURIC 6.4 08/11/2008                        Renal Function Markers Lab Results  Component Value  Date   BUN 20 12/12/2016   CREATININE 0.74 (L) 12/12/2016   GFRAA 120 12/12/2016   GFRNONAA 104 12/12/2016                              Hepatic Function Markers Lab Results  Component Value Date   AST 21 12/12/2016   ALT 12 12/30/2014   ALBUMIN 4.5 12/12/2016   ALKPHOS 66 12/12/2016   HCVAB NEGATIVE 12/30/2014                        Electrolytes Lab Results  Component Value Date   NA 140 12/12/2016   K 4.5 12/12/2016   CL 99 12/12/2016   CALCIUM 9.7 12/12/2016   MG 1.9 12/12/2016                        Neuropathy Markers Lab Results  Component Value Date   YFVCBSWH67 591 12/12/2016                        Bone Pathology Markers Lab Results  Component Value Date   25OHVITD1 44 12/12/2016   25OHVITD2 <1.0 12/12/2016   25OHVITD3 43 12/12/2016                         Coagulation Parameters Lab Results  Component Value Date   PLT 167.0 03/03/2011                        Cardiovascular Markers Lab Results  Component Value Date   HGB 13.1 03/03/2011   HCT 38.5 (L) 03/03/2011                         CA Markers No results found for: CEA, CA125, LABCA2                      Note: Lab results reviewed.  Recent Diagnostic Imaging Results  DG C-Arm 1-60 Min-No Report Fluoroscopy was utilized by the requesting physician.  No radiographic  interpretation.   Complexity Note: Imaging results reviewed. Results shared with Lance Roman, using Layman's terms.  Meds   Current Outpatient Medications:  .  celecoxib (CELEBREX) 200 MG capsule, Take 1 capsule (200 mg total) by mouth 2 (two) times daily., Disp: 180 capsule, Rfl: 0 .  docusate sodium (COLACE) 100 MG capsule, Take 200 mg by mouth daily., Disp: , Rfl:  .  esomeprazole (NEXIUM) 40 MG capsule, Take 1 capsule (40 mg total) by mouth 2 (two) times daily before a meal., Disp: 180 capsule, Rfl: 0 .  [START ON 07/19/2017] morphine (MS CONTIN) 30 MG 12 hr tablet, Take 1 tablet (30 mg total) by mouth  every 8 (eight) hours., Disp: 90 tablet, Rfl: 0 .  morphine (MSIR) 15 MG tablet, Take 1 tablet (15 mg total) by mouth every 6 (six) hours as needed for severe pain., Disp: 120 tablet, Rfl: 0 .  Multiple Vitamin (MULTIVITAMIN) capsule, Take 1 capsule by mouth daily., Disp: , Rfl:  .  olopatadine (PATANOL) 0.1 % ophthalmic solution, Place 1 drop into both eyes as needed. , Disp: , Rfl:   ROS  Constitutional: Denies any fever or chills Gastrointestinal: No reported hemesis, hematochezia, vomiting, or acute GI distress Musculoskeletal: Denies any acute onset joint swelling, redness, loss of ROM, or weakness Neurological: No reported episodes of acute onset apraxia, aphasia, dysarthria, agnosia, amnesia, paralysis, loss of coordination, or loss of consciousness  Allergies  Lance Roman has No Known Allergies.  [place]  Drug: Lance Roman  reports that he does not use drugs. Alcohol:  reports that he drinks about 1.8 oz of alcohol per week. Tobacco:  reports that he has quit smoking. His smokeless tobacco use includes chew. Medical:  has a past medical history of Allergic rhinitis, Chronic pain syndrome, DDD (degenerative disc disease), lumbosacral, GERD (gastroesophageal reflux disease), Hyperlipidemia, Osteoarthritis, and S/P appendectomy. Surgical: Lance Roman  has a past surgical history that includes Appendectomy; repairs of bilateral ankle fractures; Spinal fusion (N/A); and Spinal cord stimulator implant (N/A). Family: family history includes Diabetes in his maternal grandmother; Heart disease in his father.  Constitutional Exam  General appearance: Well nourished, well developed, and well hydrated. In no apparent acute distress Vitals:   07/12/17 0919  BP: 123/87  Pulse: 60  Temp: 98.1 F (36.7 C)  SpO2: 100%  Weight: 190 lb (86.2 kg)  Height: 5' 10"  (1.778 m)  Psych/Mental status: Alert, oriented x 3 (person, place, & time)       Eyes: PERLA Respiratory: No evidence of acute respiratory  distress  Lumbar Spine Area Exam  Skin & Axial Inspection: Well healed scar from previous spine surgery detected Alignment: Symmetrical Functional ROM: Unrestricted ROM       Stability: No instability detected Muscle Tone/Strength: Functionally intact. No obvious neuro-muscular anomalies detected. Sensory (Neurological): Unimpaired Palpation: Complains of area being tender to palpation       Provocative Tests: Lumbar Hyperextension and rotation test: evaluation deferred today       Lumbar Lateral bending test: evaluation deferred today       Patrick's Maneuver: evaluation deferred today                    Gait & Posture Assessment  Ambulation: Unassisted Gait: Relatively normal for age and body habitus Posture: WNL   Lower Extremity Exam    Side: Right lower extremity  Side: Left lower extremity  Skin & Extremity Inspection: Skin color, temperature, and hair growth are WNL. No peripheral edema or cyanosis. No masses, redness, swelling, asymmetry, or associated skin lesions. No contractures.  Skin & Extremity Inspection:  Skin color, temperature, and hair growth are WNL. No peripheral edema or cyanosis. No masses, redness, swelling, asymmetry, or associated skin lesions. No contractures.  Functional ROM: Unrestricted ROM          Functional ROM: Unrestricted ROM          Muscle Tone/Strength: Functionally intact. No obvious neuro-muscular anomalies detected.  Muscle Tone/Strength: Functionally intact. No obvious neuro-muscular anomalies detected.  Sensory (Neurological): Unimpaired  Sensory (Neurological): Unimpaired  Palpation: No palpable anomalies  Palpation: No palpable anomalies   Assessment  Primary Diagnosis & Pertinent Problem List: The primary encounter diagnosis was Lumbar facet hypertrophy (Bilateral). Diagnoses of Chronic lumbar radicular pain (L5) (Left), Chronic low back pain (Primary Area of Pain) (Bilateral) (L>R), Chronic pain syndrome, and Long term prescription opiate  use were also pertinent to this visit.  Status Diagnosis  Stable Stable Stable 1. Lumbar facet hypertrophy (Bilateral)   2. Chronic lumbar radicular pain (L5) (Left)   3. Chronic low back pain (Primary Area of Pain) (Bilateral) (L>R)   4. Chronic pain syndrome   5. Long term prescription opiate use     Problems updated and reviewed during this visit: No problems updated. Plan of Care  Pharmacotherapy (Medications Ordered): Meds ordered this encounter  Medications  . morphine (MS CONTIN) 30 MG 12 hr tablet    Sig: Take 1 tablet (30 mg total) by mouth every 8 (eight) hours.    Dispense:  90 tablet    Refill:  0    Fill one day early if pharmacy is closed on scheduled refill date. Do not fill until: 07/19/2017 To last until: 08/18/2017    Order Specific Question:   Supervising Provider    Answer:   Milinda Pointer [id number]   New Prescriptions   No medications on file   Medications administered today: Lance Roman. Koskinen had no medications administered during this visit. Lab-work, procedure(s), and/or referral(s): Orders Placed This Encounter  Procedures  . ToxASSURE Select 13 (MW), Urine   Imaging and/or referral(s): None  Interventional therapies: Planned, scheduled, and/or pending: Not at this time. Tapering of current Morphine with Dr Lowella Dandy Pt was informed that Dr Lowella Dandy will adjust the medication slowly so that he will not have to suffer with withdrawal . He was also informed that this taper would require multiple prescriptions.   Considering: Removal of nonfunctional spinal cord stimulatorfollowed by an MRI of the lumbar spine with and without contrast. Diagnostic bilateral lumbar facet block Possible bilateral lumbar facet RFA Diagnostic caudal epidural steroid injection plus diagnostic epidurogram Possible Racz procedure   PRN Procedures: None at this time    Provider-requested follow-up: Return in about 1 month (around 08/09/2017) for w/ Dr.  Dossie Arbour for Medication Tapering MME 150 mg/day.  Future Appointments  Date Time Provider Sisters  08/09/2017  8:30 AM Milinda Pointer, MD [facility] None   Primary Care Physician: Hoyt Koch, MD Location: [facility] Note by: Vevelyn Francois NP Date: 07/12/2017; Time: 12:13 PM  Pain Score Disclaimer: We use the NRS-11 scale. This is a self-reported, subjective measurement of pain severity with only modest accuracy. It is used primarily to identify changes within a particular patient. It must be understood that outpatient pain scales are significantly less accurate that those used for research, where they can be applied under ideal controlled circumstances with minimal exposure to variables. In reality, the score is likely to be a combination of pain intensity and pain affect, where pain affect describes the degree  of emotional arousal or changes in action readiness caused by the sensory experience of pain. Factors such as social and work situation, setting, emotional state, anxiety levels, expectation, and prior pain experience may influence pain perception and show large inter-individual differences that may also be affected by time variables.  Patient instructions provided during this appointment: Patient Instructions  ____________________________________________________________________________________________  Medication Rules  Applies to: All patients receiving prescriptions (written or electronic).  Pharmacy of record: Pharmacy where electronic prescriptions will be sent. If written prescriptions are taken to a different pharmacy, please inform the nursing staff. The pharmacy listed in the electronic medical record should be the one where you would like electronic prescriptions to be sent.  Prescription refills: Only during scheduled appointments. Applies to both, written and electronic prescriptions.  NOTE: The following applies  primarily to controlled substances (Opioid* Pain Medications).   Patient's responsibilities: 1. Pain Pills: Bring all pain pills to every appointment (except for procedure appointments). 2. Pill Bottles: Bring pills in original pharmacy bottle. Always bring newest bottle. Bring bottle, even if empty. 3. Medication refills: You are responsible for knowing and keeping track of what medications you need refilled. The day before your appointment, write a list of all prescriptions that need to be refilled. Bring that list to your appointment and give it to the admitting nurse. Prescriptions will be written only during appointments. If you forget a medication, it will not be "Called in", "Faxed", or "electronically sent". You will need to get another appointment to get these prescribed. 4. Prescription Accuracy: You are responsible for carefully inspecting your prescriptions before leaving our office. Have the discharge nurse carefully go over each prescription with you, before taking them home. Make sure that your name is accurately spelled, that your address is correct. Check the name and dose of your medication to make sure it is accurate. Check the number of pills, and the written instructions to make sure they are clear and accurate. Make sure that you are given enough medication to last until your next medication refill appointment. 5. Taking Medication: Take medication as prescribed. Never take more pills than instructed. Never take medication more frequently than prescribed. Taking less pills or less frequently is permitted and encouraged, when it comes to controlled substances (written prescriptions).  6. Inform other Doctors: Always inform, all of your healthcare providers, of all the medications you take. 7. Pain Medication from other Providers: You are not allowed to accept any additional pain medication from any other Doctor or Healthcare provider. There are two exceptions to this rule. (see below)  In the event that you require additional pain medication, you are responsible for notifying us, as stated below. 8. Medication Agreement: You are responsible for carefully reading and following our Medication Agreement. This must be signed before receiving any prescriptions from our practice. Safely store a copy of your signed Agreement. Violations to the Agreement will result in no further prescriptions. (Additional copies of our Medication Agreement are available upon request.) 9. Laws, Rules, & Regulations: All patients are expected to follow all Federal and [employer], [employer], Rules, [employer]. Ignorance of the Laws does not constitute a valid excuse. The use of any illegal substances is prohibited. 10. Adopted CDC guidelines & recommendations: Target dosing levels will be at or below 60 MME/day. Use of benzodiazepines** is not recommended.  Exceptions: There are only two exceptions to the rule of not receiving pain medications from other Healthcare Providers. 1. Exception #1 (Emergencies): In the event of an emergency (  i.e.: accident requiring emergency care), you are allowed to receive additional pain medication. However, you are responsible for: As soon as you are able, call our office [phone], at any time of the day or night, and leave a message stating your name, the date and nature of the emergency, and the name and dose of the medication prescribed. In the event that your call is answered by a member of our staff, make sure to document and save the date, time, and the name of the person that took your information.  2. Exception #2 (Planned Surgery): In the event that you are scheduled by another doctor or dentist to have any type of surgery or procedure, you are allowed (for a period no longer than 30 days), to receive additional pain medication, for the acute post-op pain. However, in this case, you are responsible for picking up a copy of our "Post-op Pain Management for Surgeons"  handout, and giving it to your surgeon or dentist. This document is available at our office, and does not require an appointment to obtain it. Simply go to our office during business hours (Monday-Thursday from 8:00 AM to 4:00 PM) (Friday 8:00 AM to 12:00 Noon) or if you have a scheduled appointment with [place], prior to your surgery, and ask for it by name. In addition, you will need to provide [place] with your name, name of your surgeon, type of surgery, and date of procedure or surgery.  *Opioid medications include: morphine, codeine, oxycodone, oxymorphone, hydrocodone, hydromorphone, meperidine, tramadol, tapentadol, buprenorphine, fentanyl, methadone. **Benzodiazepine medications include: diazepam (Valium), alprazolam (Xanax), clonazepam (Klonopine), lorazepam (Ativan), clorazepate (Tranxene), chlordiazepoxide (Librium), estazolam (Prosom), oxazepam (Serax), temazepam (Restoril), triazolam (Halcion) (Last updated: 05/25/2017) ____________________________________________________________________________________________

## 2017-07-12 NOTE — Progress Notes (Signed)
Nursing Pain Medication Assessment:  Safety precautions to be maintained throughout the outpatient stay will include: orient to surroundings, keep bed in low position, maintain call bell within reach at all times, provide assistance with transfer out of bed and ambulation.  Medication Inspection Compliance: Pill count conducted under aseptic conditions, in front of the patient. Neither the pills nor the bottle was removed from the patient's sight at any time. Once count was completed pills were immediately returned to the patient in their original bottle.  Medication #1: Oxycodone ER (OxyContin) Pill/Patch Count: 25 of 90 pills remain Pill/Patch Appearance: Markings consistent with prescribed medication Bottle Appearance: Standard pharmacy container. Clearly labeled. Filled Date: 03 / 26 / 2019 Last Medication intake:  Today  Medication #2: Oxycodone IR Pill/Patch Count: 76 of 120 pills remain Pill/Patch Appearance: Markings consistent with prescribed medication Bottle Appearance: Standard pharmacy container. Clearly labeled. Filled Date: 03 / 26 / 2019 Last Medication intake:  Today

## 2017-07-12 NOTE — Patient Instructions (Signed)
____________________________________________________________________________________________  Medication Rules  Applies to: All patients receiving prescriptions (written or electronic).  Pharmacy of record: Pharmacy where electronic prescriptions will be sent. If written prescriptions are taken to a different pharmacy, please inform the nursing staff. The pharmacy listed in the electronic medical record should be the one where you would like electronic prescriptions to be sent.  Prescription refills: Only during scheduled appointments. Applies to both, written and electronic prescriptions.  NOTE: The following applies primarily to controlled substances (Opioid* Pain Medications).   Patient's responsibilities: 1. Pain Pills: Bring all pain pills to every appointment (except for procedure appointments). 2. Pill Bottles: Bring pills in original pharmacy bottle. Always bring newest bottle. Bring bottle, even if empty. 3. Medication refills: You are responsible for knowing and keeping track of what medications you need refilled. The day before your appointment, write a list of all prescriptions that need to be refilled. Bring that list to your appointment and give it to the admitting nurse. Prescriptions will be written only during appointments. If you forget a medication, it will not be "Called in", "Faxed", or "electronically sent". You will need to get another appointment to get these prescribed. 4. Prescription Accuracy: You are responsible for carefully inspecting your prescriptions before leaving our office. Have the discharge nurse carefully go over each prescription with you, before taking them home. Make sure that your name is accurately spelled, that your address is correct. Check the name and dose of your medication to make sure it is accurate. Check the number of pills, and the written instructions to make sure they are clear and accurate. Make sure that you are given enough medication to last  until your next medication refill appointment. 5. Taking Medication: Take medication as prescribed. Never take more pills than instructed. Never take medication more frequently than prescribed. Taking less pills or less frequently is permitted and encouraged, when it comes to controlled substances (written prescriptions).  6. Inform other Doctors: Always inform, all of your healthcare providers, of all the medications you take. 7. Pain Medication from other Providers: You are not allowed to accept any additional pain medication from any other Doctor or Healthcare provider. There are two exceptions to this rule. (see below) In the event that you require additional pain medication, you are responsible for notifying us, as stated below. 8. Medication Agreement: You are responsible for carefully reading and following our Medication Agreement. This must be signed before receiving any prescriptions from our practice. Safely store a copy of your signed Agreement. Violations to the Agreement will result in no further prescriptions. (Additional copies of our Medication Agreement are available upon request.) 9. Laws, Rules, & Regulations: All patients are expected to follow all Federal and State Laws, Statutes, Rules, & Regulations. Ignorance of the Laws does not constitute a valid excuse. The use of any illegal substances is prohibited. 10. Adopted CDC guidelines & recommendations: Target dosing levels will be at or below 60 MME/day. Use of benzodiazepines** is not recommended.  Exceptions: There are only two exceptions to the rule of not receiving pain medications from other Healthcare Providers. 1. Exception #1 (Emergencies): In the event of an emergency (i.e.: accident requiring emergency care), you are allowed to receive additional pain medication. However, you are responsible for: As soon as you are able, call our office [phone], at any time of the day or night, and leave a message stating your name, the  date and nature of the emergency, and the name and dose of the medication   prescribed. In the event that your call is answered by a member of our staff, make sure to document and save the date, time, and the name of the person that took your information.  2. Exception #2 (Planned Surgery): In the event that you are scheduled by another doctor or dentist to have any type of surgery or procedure, you are allowed (for a period no longer than 30 days), to receive additional pain medication, for the acute post-op pain. However, in this case, you are responsible for picking up a copy of our "Post-op Pain Management for Surgeons" handout, and giving it to your surgeon or dentist. This document is available at our office, and does not require an appointment to obtain it. Simply go to our office during business hours (Monday-Thursday from 8:00 AM to 4:00 PM) (Friday 8:00 AM to 12:00 Noon) or if you have a scheduled appointment with us, prior to your surgery, and ask for it by name. In addition, you will need to provide us with your name, name of your surgeon, type of surgery, and date of procedure or surgery.  *Opioid medications include: morphine, codeine, oxycodone, oxymorphone, hydrocodone, hydromorphone, meperidine, tramadol, tapentadol, buprenorphine, fentanyl, methadone. **Benzodiazepine medications include: diazepam (Valium), alprazolam (Xanax), clonazepam (Klonopine), lorazepam (Ativan), clorazepate (Tranxene), chlordiazepoxide (Librium), estazolam (Prosom), oxazepam (Serax), temazepam (Restoril), triazolam (Halcion) (Last updated: 05/25/2017) ____________________________________________________________________________________________    

## 2017-07-17 LAB — TOXASSURE SELECT 13 (MW), URINE

## 2017-07-28 ENCOUNTER — Other Ambulatory Visit: Payer: Self-pay | Admitting: Nurse Practitioner

## 2017-07-28 DIAGNOSIS — M47816 Spondylosis without myelopathy or radiculopathy, lumbar region: Secondary | ICD-10-CM

## 2017-07-28 DIAGNOSIS — K219 Gastro-esophageal reflux disease without esophagitis: Secondary | ICD-10-CM

## 2017-08-08 NOTE — Progress Notes (Signed)
Patient's Name: Lance Roman  MRN: [id number]  Referring Provider: Hoyt Koch, *  DOB: 04/11/1960  PCP: Hoyt Koch, MD  DOS: 08/09/2017  Note by: Gaspar Cola, MD  Service setting: Ambulatory outpatient  Specialty: Interventional Pain Management  Location: [facility] (AMB) Pain Management Facility    Patient type: Established   Primary Reason(s) for Visit: Encounter for prescription drug management. (Level of risk: moderate)  CC: Back Pain (left leg)  HPI  Lance Roman is a 57 y.o. year old, male patient, who comes today for a medication management evaluation. He has GERD; Chronic low back pain (Primary Area of Pain) (Bilateral) (L>R); Pharmacologic therapy; Infected sebaceous cyst; Disorder of skeletal system; Problems influencing health status; Opiate use (150 MME/day); Long term prescription opiate use; Chronic pain syndrome; Muscle cramps; Long-term current use of opiate analgesic; Chronic lower extremity pain (Secondary Area of Pain) (Left); Chronic lumbar radicular pain (L5) (Left); Retrolisthesis (4-5 mm) (L3 over L4) (L2 over L3; Failed back surgical syndrome (Fusion L4-S1); Lumbar facet hypertrophy (Bilateral); Lumbar facet syndrome (Bilateral) (L>R); Spinal cord stimulator dysfunction (fractured leads); Lumbar facet osteoarthritis (Bilateral); Osteoarthritis of lumbar spine; Spondylosis without myelopathy or radiculopathy, lumbosacral region; Other specified dorsopathies, sacral and sacrococcygeal region; Chronic hip pain (Left); Chronic knee pain (Left); and Other intervertebral disc degeneration, lumbar region on their problem list. His primarily concern today is the Back Pain (left leg)  Pain Assessment: Location: Lower Back Radiating: Radiates down left leg to heel Onset: More than a month ago Duration: Chronic pain Quality: Constant, Aching, Cramping, Numbness, Tightness, Throbbing Severity: 3 /10 (subjective, self-reported pain score)  Note: Reported level is  compatible with observation.                         When using our objective Pain Scale, levels between 6 and 10/10 are said to belong in an emergency room, as it progressively worsens from a 6/10, described as severely limiting, requiring emergency care not usually available at an outpatient pain management facility. At a 6/10 level, communication becomes difficult and requires great effort. Assistance to reach the emergency department may be required. Facial flushing and profuse sweating along with potentially dangerous increases in heart rate and blood pressure will be evident. Timing: Constant Modifying factors: move around, strecthing, repositioning in bed, meds BP: (!) 141/81  HR: 62  Lance Roman was last scheduled for an appointment on 07/10/2017 for medication management. During today's appointment we reviewed Lance Roman's chronic pain status, as well as his outpatient medication regimen. The patient is currently using 150 MME/day and Worker's Compensation has requested to evaluate the patient to see if we can bring that down. I absolutely agree with this and I have in fact talked to him several times about doing a "Drug Holiday", so that we can bring his opiate use to a more manageable level. In addition, today we have talked about his spinal cord stimulator. In today's note I have included 2 x-ray pictures of the device showing fracture of the leads at multiple levels. In addition, the x-ray of the battery shows that the leads are not connected. Either they dislodged, or they were never connected properly. However at this point, the main thing that we can see is that those leads or knot in the correct place.  Today we have collected and disposed of his morphine IR 15 mg pills #20. Today we will be starting and a downwardly opioid taper that will last several  weeks. We will do a like this in order to avoid any of Withdrawals. He indicates that he has talked to a Psychologist, sport and exercise and they expressed an interest  in having him do a CT myelogram, however it was not ordered. I will take care of that today. Today I also had the patient talked to Loistine Chance, particularly manager for [employer] so that he could explain to the patient that latest advances with regards to the neurostimulator. The patient has expressed his interest in having the device revised and replaced. In addition to this we also talked about the results of his diagnostic facet blocks and the inconsistencies that I found between his recording of the pain score right after the procedure and what he remembered on his postprocedure evaluation, 2 weeks later. He now understands better how he needs to evaluate this so that we can get the most information out of it. Because of this, we have decided to go ahead and repeat this procedure so that we can get accurate information.  In order to taper the patient off of these narcotics, we will do so very slowly. Today I will be changing his morphine ER 30 mg from 3 times a day to twice a day. This will continue the same until his next visit when we will take an that portion of his medication. He will be running out of the morphine ER on 10/22/2017. Today we will switch the other 60 mg of morphine that he takes per day to a shorter acting that we will have him take 7/day, and we will decrease this on a weekly basis until we get to 0/day. Based on my calculations, he will be off of the oxycodone on 09/27/2017. When he returns around 10/22/2017, we will then convert his morphine ER 30 mg to oxycodone IR 5 mg and again proceed with a similar downward taper until we have him completely off of the opioids. In order to accomplish this, I need for Worker's Compensation to approve my prescriptions immediately, since we will commence this downward taper today (08/09/2017). If my calculations are correct and we can get the medications approved in a timely manner, we should have him completely off of the narcotics by the  first week of September 2019. However, in order to accomplish this, I need to be able to also decrease his pain so that his medication requirements also go down. This will be done by repeating his diagnostic lumbar facet block and if effective (more than 50% relief of the pain for the duration of the local anesthetic), then we will follow-up with a radiofrequency ablation in order to provide him with longer lasting benefit, thereby decreasing his need for pain medication. Obviously, this will not take care of all the problems since the last x-ray of the lumbar spine with flexion extension views revealed that the patient does have instability at the L3-4 level that may require surgical intervention. At this point, his lower extremity symptoms are limited and he is not displaying any evidence of a full radiculopathy. However he will remain having problems with his failed back surgical syndrome. My plan is to treat this with the spinal cord stimulator, which were planning to revise to replace for that MRI compatible unit. The device that he currently has implanted is extremely old, the leads aren't disconnected, the leads are also fracture, and each lead has only 4 electrodes for a total of 8 electrodes. This tends to covert a rather small area. The neurostimulator that  were planning on implanting, has to leads with 16 electrodes in each lead for a total of 32 electrodes. This will provide [place] with a much better coverage that hopefully will include not only his lower extremity pain but also his low back pain.   The patient  reports that he does not use drugs. His body mass index is 27.26 kg/m.  Further details on both, my assessment(s), as well as the proposed treatment plan, please see below.  Controlled Substance Pharmacotherapy Assessment REMS (Risk Evaluation and Mitigation Strategy)  Analgesic: Morphine ER 30 mg every 8 hours + morphine IR 15 mg BID (120MME/day) MME/day: 146m/day BChauncey Fischer RN   08/09/2017  8:51 AM  Sign at close encounter Nursing Pain Medication Assessment:  Safety precautions to be maintained throughout the outpatient stay will include: orient to surroundings, keep bed in low position, maintain call bell within reach at all times, provide assistance with transfer out of bed and ambulation.  Medication Inspection Compliance: Pill count conducted under aseptic conditions, in front of the patient. Neither the pills nor the bottle was removed from the patient's sight at any time. Once count was completed pills were immediately returned to the patient in their original bottle.  Medication #1: Morphine IR Pill/Patch Count: 20 of 120 pills remain Pill/Patch Appearance: Markings consistent with prescribed medication Bottle Appearance: Standard pharmacy container. Clearly labeled. Filled Date: 03 / 26 / 2019 Last Medication intake:  Today  Medication #2: Morphine ER (MSContin) Pill/Patch Count: 32 of 90 pills remain Pill/Patch Appearance: Markings consistent with prescribed medication Bottle Appearance: Standard pharmacy container. Clearly labeled. Filled Date: 04 /24/ 2019 Last Medication intake:  Today   Pharmacokinetics: Liberation and absorption (onset of action): WNL Distribution (time to peak effect): WNL Metabolism and excretion (duration of action): WNL         Pharmacodynamics: Desired effects: Analgesia: Lance Roman >50% benefit. Functional ability: Patient reports that medication allows him to accomplish basic ADLs Clinically meaningful improvement in function (CMIF): Sustained CMIF goals met Perceived effectiveness: Described as relatively effective, allowing for increase in activities of daily living (ADL) Undesirable effects: Side-effects or Adverse reactions: None reported Monitoring: [place] PMP: Online review of the past 147-montheriod conducted. Compliant with practice rules and regulations Last UDS on record: Summary  Date Value Ref Range  Status  07/12/2017 FINAL  Final    Comment:    ==================================================================== TOXASSURE SELECT 13 (MW) ==================================================================== Test                             Result       Flag       Units Drug Present and Declared for Prescription Verification   Morphine                       >2>09604     EXPECTED   ng/mg creat   Normorphine                    1054         EXPECTED   ng/mg creat    Potential sources of large amounts of morphine in the absence of    codeine include administration of morphine or use of heroin.    Normorphine is an expected metabolite of morphine.   Hydromorphone                  240  EXPECTED   ng/mg creat    Hydromorphone may be present as a metabolite of morphine;    concentrations of hydromorphone rarely exceed 5% of the morphine    concentration when this is the source of hydromorphone. ==================================================================== Test                      Result    Flag   Units      Ref Range   Creatinine              48               mg/dL      >=20 ==================================================================== Declared Medications:  The flagging and interpretation on this report are based on the  following declared medications.  Unexpected results may arise from  inaccuracies in the declared medications.  **Note: The testing scope of this panel includes these medications:  Morphine (Morphine Sulfate)  **Note: The testing scope of this panel does not include following  reported medications:  Celecoxib  Docusate  Esomeprazole  Magnesium  Multivitamin  Olopatadine ==================================================================== For clinical consultation, please call [phone]. ====================================================================    UDS interpretation: Compliant          Medication Assessment Form: Reviewed.  Patient indicates being compliant with therapy Treatment compliance: Compliant Risk Assessment Profile: Aberrant behavior: See prior evaluations. None observed or detected today Comorbid factors increasing risk of overdose: See prior notes. No additional risks detected today Risk of substance use disorder (SUD): Low  ORT Scoring interpretation table:  Score <3 = Low Risk for SUD  Score between 4-7 = Moderate Risk for SUD  Score >8 = High Risk for Opioid Abuse   Risk Mitigation Strategies:  Patient Counseling: Covered Patient-Prescriber Agreement (PPA): Present and active  Notification to other healthcare providers: Done  Pharmacologic Plan: No change in therapy, at this time.             Laboratory Chemistry  Inflammation Markers (CRP: Acute Phase) (ESR: Chronic Phase) Lab Results  Component Value Date   CRP 2.5 12/12/2016   ESRSEDRATE 10 12/12/2016                         Rheumatology Markers Lab Results  Component Value Date   LABURIC 6.4 08/11/2008                        Renal Function Markers Lab Results  Component Value Date   BUN 20 12/12/2016   CREATININE 0.74 (L) 12/12/2016   GFRAA 120 12/12/2016   GFRNONAA 104 12/12/2016                              Hepatic Function Markers Lab Results  Component Value Date   AST 21 12/12/2016   ALT 12 12/30/2014   ALBUMIN 4.5 12/12/2016   ALKPHOS 66 12/12/2016   HCVAB NEGATIVE 12/30/2014                        Electrolytes Lab Results  Component Value Date   NA 140 12/12/2016   K 4.5 12/12/2016   CL 99 12/12/2016   CALCIUM 9.7 12/12/2016   MG 1.9 12/12/2016                        Neuropathy Markers Lab Results  Component  Value Date   GOTLXBWI20 355 12/12/2016                        Bone Pathology Markers Lab Results  Component Value Date   25OHVITD1 44 12/12/2016   25OHVITD2 <1.0 12/12/2016   25OHVITD3 43 12/12/2016                         Coagulation Parameters Lab Results  Component Value Date    PLT 167.0 03/03/2011                        Cardiovascular Markers Lab Results  Component Value Date   HGB 13.1 03/03/2011   HCT 38.5 (L) 03/03/2011                         Note: Lab results reviewed.  Recent Diagnostic Imaging Review  Thoracic Imaging: Thoracic DG 2-3 views:  Results for orders placed in visit on 04/25/02  DG Thoracic Spine 1 View   Narrative FINDINGS * CORRECTED ORDER/REFERRING PHYSICIAN ** DR. Nicole Kindred MELOY  [place] [address]       Adrian Blackwater, [place] CLINICAL DATA:  SPINAL CORD STIMULATOR LEAD PLACEMENT. TWO VIEW, THORACIC SPINE THERE ARE TWO LEADS SEEN DORSALLY LOCATED WITHIN THE SPINAL CANAL.  THESE EXTEND TO THE T9 VERTEBRAL LEVEL.  THE THORACIC VERTEBRAE SHOW MINOR DEGENERATIVE CHANGE BUT OTHERWISE HAVE A NORMAL APPEARANCE. IMPRESSION SPINAL CORD STIMULATOR WITH LEADS PRESENT WITHIN THE SPINAL CANAL DORSALLY AS DISCUSSED ABOVE. OTHERWISE, NORMAL VIEWS OF THE THORACIC SPINE ASIDE FROM MINOR DEGENERATIVE CHANGE.   Lumbosacral Imaging: Lumbar CT w/wo contrast:  Results for orders placed in visit on 04/14/17  CT L SPINE LTD WO OR W CONTRAST   Lumbar DG 2-3 views:  Results for orders placed in visit on 08/22/00  DG Lumbar Spine 2-3 Views   Narrative FINDINGS CLINICAL DATA:  STATUS-POST L4-5 FUSION, NOW FOR CHECK-UP. LUMBAR SPINE, TWO VIEWS: COMPARISON IS MADE TO June 13, 2000. THE PATIENT IS STATUS-POST POSTERIOR FUSION FROM L-4 THROUGH S-1. THE DISC SPACES REMAIN IN PLACE. THERE IS NORMAL ALIGNMENT. NO HARDWARE COMPLICATING FEATURE IS SEEN. NO CHANGE SINCE THE PRIOR EXAM. IMPRESSION NO SIGNIFICANT CHANGE IN THE POST-OP APPEARANCE OF THE LUMBOSACRAL SPINE.   Lumbar DG Bending views:  Results for orders placed during the hospital encounter of 12/12/16  DG Lumbar Spine Complete W/Bend   Narrative CLINICAL DATA:  57 year old male with chronic lumbar back pain. Prior surgery and spinal stimulator.  EXAM: LUMBAR SPINE - COMPLETE WITH BENDING  VIEWS  COMPARISON:  Report of lumbar radiographs 08/22/2000 (no images available).  FINDINGS: Normal lumbar segmentation. Mild retrolisthesis of L3 on L4 measuring 4-5 mm. Lesser retrolisthesis of L2 on L3. Otherwise relatively preserved lumbar lordosis. Previous L4-L5 through L5-S1 fusion and decompression. Solid-appearing arthrodesis. Bilateral transpedicular hardware at those levels appears intact. Mild to moderate associated posterior L3-L4 disc space loss and endplate spurring. Lesser disc space loss and endplate degeneration elsewhere. Mild to moderate facet hypertrophy at L3-L4, minimal elsewhere.  Posterior approach spinal stimulator device with fractured leads (most notably the right hand side lead near situated between the T12 and L1 spinous processes). The intraspinal component is centered at the T12 level.  SI joints and the sacrum appear to be within normal limits. Visible lower thoracic levels appear intact.  Weight-bearing views in neutral flexion and extension positioning. There is motion at the  L3-L4 level, with regressed spondylolisthesis in flexion and exaggerated listhesis in extension. No other abnormal motion identified.  Negative visible bowel gas pattern.  IMPRESSION: 1. Previous L4 through S1 fusion and decompression with solid appearing arthrodesis. Hardware appears intact. 2. 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 3. Spinal stimulator device with fracture of the electrical leads.   Electronically Signed   By: Genevie Ann M.D.   On: 12/12/2016 13:43    Foot Imaging: Foot-L DG Complete:  Results for orders placed during the hospital encounter of 08/11/08  DG Foot Complete Left   Narrative Clinical Data: Left foot pain and swelling.   LEFT FOOT - COMPLETE 3+ VIEW   Comparison: None   Findings: There are degenerative changes at the first  metatarsal phalangeal joint.  No fracture.   IMPRESSION: First metatarsal phalangeal joint osteoarthritis without acute finding.  Provider: Joretta Bachelor               Complexity Note: Imaging results reviewed. Results shared with Lance Roman, using Layman's terms. Today I personally and independently reviewed the study images pertinent to Lance Roman's problem.       Images reviewed using the PACS system hyperlink. I have personally examined the images. I agree with the Radiologist's report. The following observed findings are of concern to me: however, the report fails to also indicate that the leads are not correctly seated within the battery pack and therefore there is no way that the leads could be conducting any electrical stimulation.  Meds   Current Outpatient Medications:  .  docusate sodium (COLACE) 100 MG capsule, Take 200 mg by mouth daily., Disp: , Rfl:  .  [START ON 08/23/2017] morphine (MS CONTIN) 30 MG 12 hr tablet, Take 1 tablet (30 mg total) by mouth every 12 (twelve) hours., Disp: 60 tablet, Rfl: 0 .  [START ON 09/22/2017] morphine (MS CONTIN) 30 MG 12 hr tablet, Take 1 tablet (30 mg total) by mouth every 12 (twelve) hours., Disp: 60 tablet, Rfl: 0 .  Multiple Vitamin (MULTIVITAMIN) capsule, Take 1 capsule by mouth daily., Disp: , Rfl:  .  olopatadine (PATANOL) 0.1 % ophthalmic solution, Place 1 drop into both eyes as needed. , Disp: , Rfl:  .  oxyCODONE (OXY IR/ROXICODONE) 5 MG immediate release tablet, For the next 7 days take 2 tab with breakfast, dinner, and at bedtime. Take only 1 tab at noon. Max: 7/day, Disp: 49 tablet, Rfl: 0 .  [START ON 08/16/2017] oxyCODONE (OXY IR/ROXICODONE) 5 MG immediate release tablet, Take 1 tablet (5 mg total) by mouth 6 (six) times daily for 7 days. Max: 6/day, Disp: 42 tablet, Rfl: 0 .  [START ON 08/23/2017] oxyCODONE (OXY IR/ROXICODONE) 5 MG immediate release tablet, Take 1 tablet (5 mg total) by mouth 5 (five) times daily for 7 days. Max:  5/day, Disp: 35 tablet, Rfl: 0 .  [START ON 08/30/2017] oxyCODONE (OXY IR/ROXICODONE) 5 MG immediate release tablet, Take 1 tablet (5 mg total) by mouth 4 (four) times daily for 7 days. Max: 4/day, Disp: 28 tablet, Rfl: 0 .  [START ON 09/06/2017] oxyCODONE (OXY IR/ROXICODONE) 5 MG immediate release tablet, Take 1 tablet (5 mg total) by mouth 3 (three) times daily for 7 days. Max: 3/day, Disp: 21 tablet, Rfl: 0 .  [START ON 09/13/2017] oxyCODONE (OXY IR/ROXICODONE) 5 MG immediate release tablet, Take 1 tablet (5 mg total) by mouth 2 (two) times daily for 7 days. Max: 2/day,  Disp: 14 tablet, Rfl: 0 .  [START ON 09/20/2017] oxyCODONE (OXY IR/ROXICODONE) 5 MG immediate release tablet, Take 1 tablet (5 mg total) by mouth daily for 7 days. Max: 1/day, Disp: 7 tablet, Rfl: 0 .  celecoxib (CELEBREX) 200 MG capsule, Take 1 capsule (200 mg total) by mouth 2 (two) times daily., Disp: 180 capsule, Rfl: 1 .  esomeprazole (NEXIUM) 40 MG capsule, Take 1 capsule (40 mg total) by mouth 2 (two) times daily before a meal., Disp: 180 capsule, Rfl: 0  ROS  Constitutional: Denies any fever or chills Gastrointestinal: No reported hemesis, hematochezia, vomiting, or acute GI distress Musculoskeletal: Denies any acute onset joint swelling, redness, loss of ROM, or weakness Neurological: No reported episodes of acute onset apraxia, aphasia, dysarthria, agnosia, amnesia, paralysis, loss of coordination, or loss of consciousness  Allergies  Lance Roman has No Known Allergies.  [place]  Drug: Lance Roman  reports that he does not use drugs. Alcohol:  reports that he drinks about 1.8 oz of alcohol per week. Tobacco:  reports that he has quit smoking. His smokeless tobacco use includes chew. Medical:  has a past medical history of Allergic rhinitis, Chronic pain syndrome, DDD (degenerative disc disease), lumbosacral, GERD (gastroesophageal reflux disease), Hyperlipidemia, Osteoarthritis, and S/P appendectomy. Surgical: Lance Roman  has  a past surgical history that includes Appendectomy; repairs of bilateral ankle fractures; Spinal fusion (N/A); and Spinal cord stimulator implant (N/A). Family: family history includes Diabetes in his maternal grandmother; Heart disease in his father.  Constitutional Exam  General appearance: Well nourished, well developed, and well hydrated. In no apparent acute distress Vitals:   08/09/17 0836  BP: (!) 141/81  Pulse: 62  Temp: 98.3 F (36.8 C)  SpO2: 99%  Weight: 190 lb (86.2 kg)  Height: _0  (1.778 m)   BMI Assessment: Estimated body mass index is 27.26 kg/m as calculated from the following:   Height as of this encounter: _1  (1.778 m).   Weight as of this encounter: 190 lb (86.2 kg).  BMI interpretation table: BMI level Category Range association with higher incidence of chronic pain  <18 kg/m2 Underweight   18.5-24.9 kg/m2 Ideal body weight   25-29.9 kg/m2 Overweight Increased incidence by 20%  30-34.9 kg/m2 Obese (Class I) Increased incidence by 68%  35-39.9 kg/m2 Severe obesity (Class II) Increased incidence by 136%  >40 kg/m2 Extreme obesity (Class III) Increased incidence by 254%   Patient's current BMI Ideal Body weight  Body mass index is 27.26 kg/m. Ideal body weight: 73 kg (160 lb 15 oz) Adjusted ideal body weight: 78.3 kg (172 lb 9 oz)   BMI Readings from Last 4 Encounters:  08/09/17 27.26 kg/m  07/12/17 27.26 kg/m  06/01/17 27.26 kg/m  04/26/17 27.26 kg/m   Wt Readings from Last 4 Encounters:  08/09/17 190 lb (86.2 kg)  07/12/17 190 lb (86.2 kg)  06/01/17 190 lb (86.2 kg)  04/26/17 190 lb (86.2 kg)  Psych/Mental status: Alert, oriented x 3 (person, place, & time)       Eyes: PERLA Respiratory: No evidence of acute respiratory distress  Cervical Spine Area Exam  Skin & Axial Inspection: No masses, redness, edema, swelling, or associated skin lesions Alignment: Symmetrical Functional ROM: Unrestricted ROM      Stability: No instability  detected Muscle Tone/Strength: Functionally intact. No obvious neuro-muscular anomalies detected. Sensory (Neurological): Unimpaired Palpation: No palpable anomalies              Upper Extremity (UE) Exam  Side: Right upper extremity  Side: Left upper extremity  Skin & Extremity Inspection: Skin color, temperature, and hair growth are WNL. No peripheral edema or cyanosis. No masses, redness, swelling, asymmetry, or associated skin lesions. No contractures.  Skin & Extremity Inspection: Skin color, temperature, and hair growth are WNL. No peripheral edema or cyanosis. No masses, redness, swelling, asymmetry, or associated skin lesions. No contractures.  Functional ROM: Unrestricted ROM          Functional ROM: Unrestricted ROM          Muscle Tone/Strength: Functionally intact. No obvious neuro-muscular anomalies detected.  Muscle Tone/Strength: Functionally intact. No obvious neuro-muscular anomalies detected.  Sensory (Neurological): Unimpaired          Sensory (Neurological): Unimpaired          Palpation: No palpable anomalies              Palpation: No palpable anomalies              Specialized Test(s): Deferred         Specialized Test(s): Deferred          Thoracic Spine Area Exam  Skin & Axial Inspection: No masses, redness, or swelling Alignment: Symmetrical Functional ROM: Unrestricted ROM Stability: No instability detected Muscle Tone/Strength: Functionally intact. No obvious neuro-muscular anomalies detected. Sensory (Neurological): Unimpaired Muscle strength & Tone: No palpable anomalies  Lumbar Spine Area Exam  Skin & Axial Inspection: No masses, redness, or swelling Alignment: Symmetrical Functional ROM: Decreased ROM       Stability: No instability detected Muscle Tone/Strength: Functionally intact. No obvious neuro-muscular anomalies detected. Sensory (Neurological): Movement-associated pain Palpation: Complains of area being tender to palpation       Provocative  Tests: Lumbar Hyperextension and rotation test: Positive bilaterally for facet joint pain. Lumbar Lateral bending test: evaluation deferred today       Patrick's Maneuver: Positive             for left hip arthralgia  Gait & Posture Assessment  Ambulation: Limited Gait: Antalgic Posture: Difficulty standing up straight, due to pain   Lower Extremity Exam    Side: Right lower extremity  Side: Left lower extremity  Stability: No instability observed          Stability: No instability observed          Skin & Extremity Inspection: Skin color, temperature, and hair growth are WNL. No peripheral edema or cyanosis. No masses, redness, swelling, asymmetry, or associated skin lesions. No contractures.  Skin & Extremity Inspection: Skin color, temperature, and hair growth are WNL. No peripheral edema or cyanosis. No masses, redness, swelling, asymmetry, or associated skin lesions. No contractures.  Functional ROM: Unrestricted ROM                  Functional ROM: Unrestricted ROM                  Muscle Tone/Strength: Functionally intact. No obvious neuro-muscular anomalies detected.  Muscle Tone/Strength: Functionally intact. No obvious neuro-muscular anomalies detected.  Sensory (Neurological): Unimpaired  Sensory (Neurological): Unimpaired  Palpation: No palpable anomalies  Palpation: No palpable anomalies   Assessment  Primary Diagnosis & Pertinent Problem List: The primary encounter diagnosis was Chronic low back pain (Primary Area of Pain) (Bilateral) (L>R). Diagnoses of Spondylosis without myelopathy or radiculopathy, lumbosacral region, Lumbar facet syndrome (Bilateral) (L>R), Retrolisthesis (4-5 mm) (L3 over L4) (L2 over L3, Other specified dorsopathies, sacral and sacrococcygeal region, Chronic pain syndrome, Spinal  cord stimulator dysfunction (fractured leads), Failed back surgical syndrome (Fusion L4-S1), Chronic hip pain (Left), Chronic knee pain (Left), Other intervertebral disc  degeneration, lumbar region, Lumbar facet osteoarthritis (Bilateral), and Osteoarthritis of lumbar spine were also pertinent to this visit.  Status Diagnosis  Persistent Unimproved Persistent 1. Chronic low back pain (Primary Area of Pain) (Bilateral) (L>R)   2. Spondylosis without myelopathy or radiculopathy, lumbosacral region   3. Lumbar facet syndrome (Bilateral) (L>R)   4. Retrolisthesis (4-5 mm) (L3 over L4) (L2 over L3   5. Other specified dorsopathies, sacral and sacrococcygeal region   6. Chronic pain syndrome   7. Spinal cord stimulator dysfunction (fractured leads)   8. Failed back surgical syndrome (Fusion L4-S1)   9. Chronic hip pain (Left)   10. Chronic knee pain (Left)   11. Other intervertebral disc degeneration, lumbar region   12. Lumbar facet osteoarthritis (Bilateral)   13. Osteoarthritis of lumbar spine     Problems updated and reviewed during this visit: Problem  Spondylosis Without Myelopathy Or Radiculopathy, Lumbosacral Region  Other Specified Dorsopathies, Sacral and Sacrococcygeal Region  Chronic hip pain (Left)  Chronic knee pain (Left)  Other Intervertebral Disc Degeneration, Lumbar Region  Retrolisthesis (4-5 mm) (L3 over L4) (L2 over L3   Time Note: Greater than 50% of the 40 minute(s) of face-to-face time spent with Lance Roman, was spent in counseling/coordination of care regarding: opioid tolerance, "Drug Holidays", Lance Roman primary cause of pain, the treatment plan, treatment alternatives, the risks and possible complications of proposed treatment, the results, interpretation and significance of  his recent diagnostic interventional treatment(s), the appropriate use of his medications, realistic expectations, the goals of pain management (increased in functionality), the importance of providing [place] with accurate post-procedure information and the need to collect and read the AVS material. Plan of Care  Pharmacotherapy (Medications Ordered): Meds  ordered this encounter  Medications  . oxyCODONE (OXY IR/ROXICODONE) 5 MG immediate release tablet    Sig: For the next 7 days take 2 tab with breakfast, dinner, and at bedtime. Take only 1 tab at noon. Max: 7/day    Dispense:  49 tablet    Refill:  0    This prescription is part of a downward opioid taper. Fill instructions must be followed exactly as written to avoid withdrawal. Fill date: 08/09/17 To last until: 08/16/17  . oxyCODONE (OXY IR/ROXICODONE) 5 MG immediate release tablet    Sig: Take 1 tablet (5 mg total) by mouth 6 (six) times daily for 7 days. Max: 6/day    Dispense:  42 tablet    Refill:  0    This prescription is part of a downward opioid taper. Fill instructions must be followed exactly as written to avoid withdrawal. Fill date: 08/16/17 To last until: 08/23/17  . oxyCODONE (OXY IR/ROXICODONE) 5 MG immediate release tablet    Sig: Take 1 tablet (5 mg total) by mouth 5 (five) times daily for 7 days. Max: 5/day    Dispense:  35 tablet    Refill:  0    This prescription is part of a downward opioid taper. Fill instructions must be followed exactly as written to avoid withdrawal. Fill date: 08/23/17 To last until: 08/30/17  . oxyCODONE (OXY IR/ROXICODONE) 5 MG immediate release tablet    Sig: Take 1 tablet (5 mg total) by mouth 4 (four) times daily for 7 days. Max: 4/day    Dispense:  28 tablet    Refill:  0    This prescription  is part of a downward opioid taper. Fill instructions must be followed exactly as written to avoid withdrawal. Fill date: 08/30/17 To last until: 09/06/17  . oxyCODONE (OXY IR/ROXICODONE) 5 MG immediate release tablet    Sig: Take 1 tablet (5 mg total) by mouth 3 (three) times daily for 7 days. Max: 3/day    Dispense:  21 tablet    Refill:  0    This prescription is part of a downward opioid taper. Fill instructions must be followed exactly as written to avoid withdrawal. Fill date: 09/06/17 To last until: 09/13/17  . oxyCODONE (OXY  IR/ROXICODONE) 5 MG immediate release tablet    Sig: Take 1 tablet (5 mg total) by mouth 2 (two) times daily for 7 days. Max: 2/day    Dispense:  14 tablet    Refill:  0    This prescription is part of a downward opioid taper. Fill instructions must be followed exactly as written to avoid withdrawal. Fill date: 09/13/17 To last until: 09/20/17  . oxyCODONE (OXY IR/ROXICODONE) 5 MG immediate release tablet    Sig: Take 1 tablet (5 mg total) by mouth daily for 7 days. Max: 1/day    Dispense:  7 tablet    Refill:  0    This prescription is part of a downward opioid taper. Fill instructions must be followed exactly as written to avoid withdrawal. Fill date: 09/20/17 To last until: 09/27/17  . morphine (MS CONTIN) 30 MG 12 hr tablet    Sig: Take 1 tablet (30 mg total) by mouth every 12 (twelve) hours.    Dispense:  60 tablet    Refill:  0    Fill one day early if pharmacy is closed on scheduled refill date. Do not fill until: 08/23/2017 To last until: 09/22/2017  . morphine (MS CONTIN) 30 MG 12 hr tablet    Sig: Take 1 tablet (30 mg total) by mouth every 12 (twelve) hours.    Dispense:  60 tablet    Refill:  0    Do not place this medication, or any other prescription from our practice, on "Automatic Refill". Patient may have prescription filled one day early if pharmacy is closed on scheduled refill date. Do not fill until: 09/22/17 To last until: 10/22/17  . celecoxib (CELEBREX) 200 MG capsule    Sig: Take 1 capsule (200 mg total) by mouth 2 (two) times daily.    Dispense:  180 capsule    Refill:  1    Do not place medication on "Automatic Refill". Fill one day early if pharmacy is closed on scheduled refill date.   Medications administered today: Lance Roman. Pasion had no medications administered during this visit.   Procedure Orders     LUMBAR FACET(MEDIAL BRANCH NERVE BLOCK) MB[facility] Lab Orders  No laboratory test(s) ordered today    Imaging Orders     CT LUMBAR SPINE WO CONTRAST      DG Myelogram Lumbar     DG Lumbar Spine Complete W/Bend Referral Orders  No referral(s) requested today   Interventional management options: Planned, scheduled, and/or pending:   Today we will commence and opioid downward tapering.  Diagnostic bilateral lumbar facet block #2 under fluoroscopic guidance and IV sedation  Therapeutic Revision and replacement of spinal cord stimulator with MRI compatible device.  Diagnostic CT-myelogram with flexion and extension views of myelogram.     Considering:   Removal of nonfunctional spinal cord stimulatorfollowed by an MRI of the lumbar spine with and  without contrast. Diagnostic bilateral lumbar facet block Possible bilateral lumbar facet RFA Diagnostic caudal epidural steroid injection plus diagnostic epidurogram Possible Racz procedure   Palliative PRN treatment(s):   None at this time   Provider-requested follow-up: Return for Procedure (w/ sedation): (B) L-FCT BLK #2.  No future appointments. Primary Care Physician: Hoyt Koch, MD Location: [facility] Note by: Gaspar Cola, MD Date: 08/09/2017; Time: 10:36 AM

## 2017-08-09 ENCOUNTER — Encounter: Payer: Self-pay | Admitting: Pain Medicine

## 2017-08-09 ENCOUNTER — Ambulatory Visit: Payer: No Typology Code available for payment source | Attending: Pain Medicine | Admitting: Pain Medicine

## 2017-08-09 ENCOUNTER — Other Ambulatory Visit: Payer: Self-pay

## 2017-08-09 VITALS — BP 141/81 | HR 62 | Temp 98.3°F | Ht 70.0 in | Wt 190.0 lb

## 2017-08-09 DIAGNOSIS — M5116 Intervertebral disc disorders with radiculopathy, lumbar region: Secondary | ICD-10-CM | POA: Diagnosis not present

## 2017-08-09 DIAGNOSIS — Z981 Arthrodesis status: Secondary | ICD-10-CM | POA: Diagnosis not present

## 2017-08-09 DIAGNOSIS — R252 Cramp and spasm: Secondary | ICD-10-CM | POA: Diagnosis not present

## 2017-08-09 DIAGNOSIS — M5137 Other intervertebral disc degeneration, lumbosacral region: Secondary | ICD-10-CM | POA: Insufficient documentation

## 2017-08-09 DIAGNOSIS — M51379 Other intervertebral disc degeneration, lumbosacral region without mention of lumbar back pain or lower extremity pain: Secondary | ICD-10-CM | POA: Insufficient documentation

## 2017-08-09 DIAGNOSIS — M961 Postlaminectomy syndrome, not elsewhere classified: Secondary | ICD-10-CM | POA: Diagnosis not present

## 2017-08-09 DIAGNOSIS — M545 Low back pain: Secondary | ICD-10-CM | POA: Diagnosis not present

## 2017-08-09 DIAGNOSIS — K219 Gastro-esophageal reflux disease without esophagitis: Secondary | ICD-10-CM | POA: Diagnosis not present

## 2017-08-09 DIAGNOSIS — G8929 Other chronic pain: Secondary | ICD-10-CM | POA: Diagnosis not present

## 2017-08-09 DIAGNOSIS — T85192S Other mechanical complication of implanted electronic neurostimulator (electrode) of spinal cord, sequela: Secondary | ICD-10-CM | POA: Diagnosis not present

## 2017-08-09 DIAGNOSIS — Z8249 Family history of ischemic heart disease and other diseases of the circulatory system: Secondary | ICD-10-CM | POA: Diagnosis not present

## 2017-08-09 DIAGNOSIS — M25552 Pain in left hip: Secondary | ICD-10-CM | POA: Diagnosis not present

## 2017-08-09 DIAGNOSIS — E785 Hyperlipidemia, unspecified: Secondary | ICD-10-CM | POA: Insufficient documentation

## 2017-08-09 DIAGNOSIS — M47816 Spondylosis without myelopathy or radiculopathy, lumbar region: Secondary | ICD-10-CM

## 2017-08-09 DIAGNOSIS — Z79891 Long term (current) use of opiate analgesic: Secondary | ICD-10-CM | POA: Diagnosis not present

## 2017-08-09 DIAGNOSIS — M5442 Lumbago with sciatica, left side: Secondary | ICD-10-CM | POA: Diagnosis not present

## 2017-08-09 DIAGNOSIS — M51369 Other intervertebral disc degeneration, lumbar region without mention of lumbar back pain or lower extremity pain: Secondary | ICD-10-CM

## 2017-08-09 DIAGNOSIS — M4726 Other spondylosis with radiculopathy, lumbar region: Secondary | ICD-10-CM | POA: Insufficient documentation

## 2017-08-09 DIAGNOSIS — M25562 Pain in left knee: Secondary | ICD-10-CM | POA: Diagnosis not present

## 2017-08-09 DIAGNOSIS — M4310 Spondylolisthesis, site unspecified: Secondary | ICD-10-CM | POA: Diagnosis not present

## 2017-08-09 DIAGNOSIS — M5388 Other specified dorsopathies, sacral and sacrococcygeal region: Secondary | ICD-10-CM | POA: Diagnosis not present

## 2017-08-09 DIAGNOSIS — J309 Allergic rhinitis, unspecified: Secondary | ICD-10-CM | POA: Insufficient documentation

## 2017-08-09 DIAGNOSIS — M47817 Spondylosis without myelopathy or radiculopathy, lumbosacral region: Secondary | ICD-10-CM | POA: Diagnosis not present

## 2017-08-09 DIAGNOSIS — Z79899 Other long term (current) drug therapy: Secondary | ICD-10-CM | POA: Insufficient documentation

## 2017-08-09 DIAGNOSIS — M79605 Pain in left leg: Secondary | ICD-10-CM | POA: Diagnosis present

## 2017-08-09 DIAGNOSIS — Z87891 Personal history of nicotine dependence: Secondary | ICD-10-CM | POA: Diagnosis not present

## 2017-08-09 DIAGNOSIS — M5136 Other intervertebral disc degeneration, lumbar region: Secondary | ICD-10-CM

## 2017-08-09 DIAGNOSIS — G894 Chronic pain syndrome: Secondary | ICD-10-CM

## 2017-08-09 MED ORDER — CELECOXIB 200 MG PO CAPS: 200.0000 mg | ORAL_CAPSULE | Freq: Two times a day (BID) | ORAL | 1 refills | Status: DC

## 2017-08-09 MED ORDER — OXYCODONE HCL 5 MG PO TABS: 5.0000 mg | ORAL_TABLET | Freq: Three times a day (TID) | ORAL | 0 refills | Status: DC

## 2017-08-09 MED ORDER — MORPHINE SULFATE ER 30 MG PO TBCR: 30.0000 mg | EXTENDED_RELEASE_TABLET | Freq: Two times a day (BID) | ORAL | 0 refills | Status: DC

## 2017-08-09 MED ORDER — OXYCODONE HCL 5 MG PO TABS: ORAL_TABLET | ORAL | 0 refills | Status: DC

## 2017-08-09 MED ORDER — OXYCODONE HCL 5 MG PO TABS: 5.0000 mg | ORAL_TABLET | Freq: Every day | ORAL | 0 refills | Status: DC

## 2017-08-09 MED ORDER — OXYCODONE HCL 5 MG PO TABS
5.0000 mg | ORAL_TABLET | Freq: Four times a day (QID) | ORAL | 0 refills | Status: DC
Start: 2017-08-30 — End: 2017-09-11

## 2017-08-09 MED ORDER — OXYCODONE HCL 5 MG PO TABS: 5.0000 mg | ORAL_TABLET | Freq: Two times a day (BID) | ORAL | 0 refills | Status: DC

## 2017-08-09 NOTE — Patient Instructions (Addendum)
____________________________________________________________________________________________  Preparing for Procedure with Sedation  Instructions: . Oral Intake: Do not eat or drink anything for at least 8 hours prior to your procedure. . Transportation: Public transportation is not allowed. Bring an adult driver. The driver must be physically present in our waiting room before any procedure can be started. Marland Kitchen Physical Assistance: Bring an adult physically capable of assisting you, in the event you need help. This adult should keep you company at home for at least 6 hours after the procedure. . Blood Pressure Medicine: Take your blood pressure medicine with a sip of water the morning of the procedure. . Blood thinners:  . Diabetics on insulin: Notify the staff so that you can be scheduled 1st case in the morning. If your diabetes requires high dose insulin, take only  of your normal insulin dose the morning of the procedure and notify the staff that you have done so. . Preventing infections: Shower with an antibacterial soap the morning of your procedure. . Build-up your immune system: Take 1000 mg of Vitamin C with every meal (3 times a day) the day prior to your procedure. Marland Kitchen Antibiotics: Inform the staff if you have a condition or reason that requires you to take antibiotics before dental procedures. . Pregnancy: If you are pregnant, call and cancel the procedure. . Sickness: If you have a cold, fever, or any active infections, call and cancel the procedure. . Arrival: You must be in the facility at least 30 minutes prior to your scheduled procedure. . Children: Do not bring children with you. . Dress appropriately: Bring dark clothing that you would not mind if they get stained. . Valuables: Do not bring any jewelry or valuables.  Procedure appointments are reserved for interventional treatments only. Marland Kitchen No Prescription Refills. . No medication changes will be discussed during procedure  appointments. . No disability issues will be discussed.  Remember:  Regular Business hours are:  Monday to Thursday 8:00 AM to 4:00 PM  Provider's Schedule: Delano Metz, MD:  Procedure days: Tuesday and Thursday 7:30 AM to 4:00 PM  Edward Jolly, MD:  Procedure days: Monday and Wednesday 7:30 AM to 4:00 PM ____________________________________________________________________________________________   ____________________________________________________________________________________________  Drug Holidays (Slow)  What is a "Drug Holiday"? Drug Holiday: is the name given to the period of time during which a patient stops taking a medication(s) for the purpose of eliminating tolerance to the drug.  Benefits . Improved effectiveness of opioids. . Decreased opioid dose needed to achieve benefits. . Improved pain with lesser dose.  What is tolerance? Tolerance: is the progressive decreased in effectiveness of a drug due to its repetitive use. With repetitive use, the body gets use to the medication and as a consequence, it loses its effectiveness. This is a common problem seen with opioid pain medications. As a result, a larger dose of the drug is needed to achieve the same effect that used to be obtained with a smaller dose.  How long should a "Drug Holiday" last? At least 14 consecutive days. (2 weeks)  What are withdrawals? Withdrawals: refers to the wide range of symptoms that occur after stopping or dramatically reducing opiate drugs after heavy and prolonged use. Withdrawal symptoms do not occur to patients that use low dose opioids, or those who take the medication sporadically. Contrary to benzodiazepine (example: Valium, Xanax, etc.) or alcohol withdrawals ("Delirium Tremens"), opioid withdrawals are not lethal. Withdrawals are the physical manifestation of the body getting rid of the excess receptors.  Expected Symptoms  Early symptoms of withdrawal may  include: . Agitation . Anxiety . Muscle aches . Increased tearing . Insomnia . Runny nose . Sweating . Yawning  Late symptoms of withdrawal may include: . Abdominal cramping . Diarrhea . Dilated pupils . Goose bumps . Nausea . Vomiting  Will I experience withdrawals? Due to the slow nature of the taper, it is very unlikely that you will experience any.  What is a slow taper? Taper: refers to the gradual decrease in dose. ___________________________________________________________________________________________

## 2017-08-09 NOTE — Progress Notes (Signed)
Nursing Pain Medication Assessment:  Safety precautions to be maintained throughout the outpatient stay will include: orient to surroundings, keep bed in low position, maintain call bell within reach at all times, provide assistance with transfer out of bed and ambulation.  Medication Inspection Compliance: Pill count conducted under aseptic conditions, in front of the patient. Neither the pills nor the bottle was removed from the patient's sight at any time. Once count was completed pills were immediately returned to the patient in their original bottle.  Medication #1: Morphine IR Pill/Patch Count: 20 of 120 pills remain Pill/Patch Appearance: Markings consistent with prescribed medication Bottle Appearance: Standard pharmacy container. Clearly labeled. Filled Date: 03 / 26 / 2019 Last Medication intake:  Today  Medication #2: Morphine ER (MSContin) Pill/Patch Count: 32 of 90 pills remain Pill/Patch Appearance: Markings consistent with prescribed medication Bottle Appearance: Standard pharmacy container. Clearly labeled. Filled Date: 04 /24/ 2019 Last Medication intake:  Today

## 2017-08-14 ENCOUNTER — Telehealth: Payer: Self-pay

## 2017-08-14 NOTE — Telephone Encounter (Signed)
The patient wants to know what kind of spinal cord stimulator he has. 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

## 2017-08-14 NOTE — Telephone Encounter (Signed)
Spoke with patient.  States he has a card that says [employer].  Informed patient that [employer] makes a stimulator and if that's what his card says, thean it must be from them.  Office that implanted stimulator is no longer in practice.

## 2017-08-17 ENCOUNTER — Encounter: Payer: Self-pay | Admitting: Pain Medicine

## 2017-08-17 ENCOUNTER — Ambulatory Visit (HOSPITAL_BASED_OUTPATIENT_CLINIC_OR_DEPARTMENT_OTHER): Payer: No Typology Code available for payment source | Admitting: Pain Medicine

## 2017-08-17 ENCOUNTER — Other Ambulatory Visit: Payer: Self-pay

## 2017-08-17 ENCOUNTER — Ambulatory Visit
Admission: RE | Admit: 2017-08-17 | Discharge: 2017-08-17 | Disposition: A | Discharge status: Home / Self Care | Payer: No Typology Code available for payment source | Source: Ambulatory Visit | Attending: Pain Medicine | Admitting: Pain Medicine

## 2017-08-17 VITALS — BP 106/63 | HR 61 | Temp 97.7°F | Resp 14 | Ht 70.0 in | Wt 190.0 lb

## 2017-08-17 DIAGNOSIS — G8929 Other chronic pain: Secondary | ICD-10-CM | POA: Insufficient documentation

## 2017-08-17 DIAGNOSIS — M47817 Spondylosis without myelopathy or radiculopathy, lumbosacral region: Secondary | ICD-10-CM | POA: Diagnosis not present

## 2017-08-17 DIAGNOSIS — Z981 Arthrodesis status: Secondary | ICD-10-CM | POA: Insufficient documentation

## 2017-08-17 DIAGNOSIS — Z9689 Presence of other specified functional implants: Secondary | ICD-10-CM | POA: Diagnosis not present

## 2017-08-17 DIAGNOSIS — M5442 Lumbago with sciatica, left side: Secondary | ICD-10-CM

## 2017-08-17 DIAGNOSIS — M47816 Spondylosis without myelopathy or radiculopathy, lumbar region: Secondary | ICD-10-CM

## 2017-08-17 DIAGNOSIS — M545 Low back pain: Secondary | ICD-10-CM | POA: Insufficient documentation

## 2017-08-17 DIAGNOSIS — M4310 Spondylolisthesis, site unspecified: Secondary | ICD-10-CM

## 2017-08-17 MED ORDER — LIDOCAINE HCL 2 % IJ SOLN
20.0000 mL | Freq: Once | INTRAMUSCULAR | Status: AC
  Administered 2017-08-17: 400 mg
  Filled 2017-08-17: qty 40

## 2017-08-17 MED ORDER — LACTATED RINGERS IV SOLN
1000.0000 mL | Freq: Once | INTRAVENOUS | Status: AC
  Administered 2017-08-17: 1000 mL via INTRAVENOUS

## 2017-08-17 MED ORDER — TRIAMCINOLONE ACETONIDE 40 MG/ML IJ SUSP
80.0000 mg | Freq: Once | INTRAMUSCULAR | Status: AC
  Administered 2017-08-17: 80 mg
  Filled 2017-08-17: qty 2

## 2017-08-17 MED ORDER — ROPIVACAINE HCL 2 MG/ML IJ SOLN
18.0000 mL | Freq: Once | INTRAMUSCULAR | Status: AC
  Administered 2017-08-17: 18 mL via PERINEURAL

## 2017-08-17 MED ORDER — MIDAZOLAM HCL 5 MG/5ML IJ SOLN
1.0000 mg | INTRAMUSCULAR | Status: DC | PRN
  Administered 2017-08-17: 3 mg via INTRAVENOUS
  Filled 2017-08-17: qty 5

## 2017-08-17 MED ORDER — FENTANYL CITRATE (PF) 100 MCG/2ML IJ SOLN
25.0000 ug | INTRAMUSCULAR | Status: DC | PRN
  Administered 2017-08-17: 100 ug via INTRAVENOUS
  Filled 2017-08-17: qty 2

## 2017-08-17 NOTE — Progress Notes (Signed)
Patient's Name: Lance Roman  MRN: [id number]  Referring Provider: Delano Metz, MD  DOB: 04-Apr-1960  PCP: Myrlene Broker, MD  DOS: 08/17/2017  Note by: Oswaldo Done, MD  Service setting: Ambulatory outpatient  Specialty: Interventional Pain Management  Patient type: Established  Location: [facility] (AMB) Pain Management Facility  Visit type: Interventional Procedure   Primary Reason for Visit: Interventional Pain Management Treatment. CC: Back Pain (lower)  Procedure:       Anesthesia, Analgesia, Anxiolysis:  Type: Lumbar Facet, Medial Branch Block(s) #2  Primary Purpose: Diagnostic Region: Posterolateral Lumbosacral Spine Level: L2, L3, L4, L5, & S1 Medial Branch Level(s). Injecting these levels blocks the L3-4, L4-5, and L5-S1 lumbar facet joints. Laterality: Bilateral  Type: Moderate (Conscious) Sedation combined with Local Anesthesia Indication(s): Analgesia and Anxiety Route: Intravenous (IV) IV Access: Secured Sedation: Meaningful verbal contact was maintained at all times during the procedure  Local Anesthetic: Lidocaine 1-2%   Indications: 1. Spondylosis without myelopathy or radiculopathy, lumbosacral region   2. Lumbar facet syndrome (Bilateral) (L>R)   3. Lumbar facet osteoarthritis (Bilateral)   4. Chronic low back pain (Primary Area of Pain) (Bilateral) (L>R)    Pain Score: Pre-procedure: 6 /10 Post-procedure: 0-No pain/10  Pre-op Assessment:  Lance Roman is a 57 y.o. (year old), male patient, seen today for interventional treatment. He  has a past surgical history that includes Appendectomy; repairs of bilateral ankle fractures; Spinal fusion (N/A); and Spinal cord stimulator implant (N/A). Mr. Hatchel has a current medication list which includes the following prescription(s): celecoxib, docusate sodium, ibuprofen, morphine, morphine, multivitamin, olopatadine, oxycodone, oxycodone, oxycodone, oxycodone, oxycodone, oxycodone, esomeprazole, and oxycodone,  and the following Facility-Administered Medications: fentanyl and midazolam. His primarily concern today is the Back Pain (lower)  Initial Vital Signs:  Pulse/HCG Rate: 61ECG Heart Rate: 63 Temp: 97.7 F (36.5 C) Resp: 16 BP: 116/73 SpO2: 100 %  BMI: Estimated body mass index is 27.26 kg/m as calculated from the following:   Height as of this encounter:  (1.778 m).   Weight as of this encounter: 190 lb (86.2 kg).  Risk Assessment: Allergies: Reviewed. He has No Known Allergies.  Allergy Precautions: None required Coagulopathies: Reviewed. None identified.  Blood-thinner therapy: None at this time Active Infection(s): Reviewed. None identified. Lance Roman is afebrile  Site Confirmation: Lance Roman was asked to confirm the procedure and laterality before marking the site Procedure checklist: Completed Consent: Before the procedure and under the influence of no sedative(s), amnesic(s), or anxiolytics, the patient was informed of the treatment options, risks and possible complications. To fulfill our ethical and legal obligations, as recommended by the American Medical Association's Code of Ethics, I have informed the patient of my clinical impression; the nature and purpose of the treatment or procedure; the risks, benefits, and possible complications of the intervention; the alternatives, including doing nothing; the risk(s) and benefit(s) of the alternative treatment(s) or procedure(s); and the risk(s) and benefit(s) of doing nothing. The patient was provided information about the general risks and possible complications associated with the procedure. These may include, but are not limited to: failure to achieve desired goals, infection, bleeding, organ or nerve damage, allergic reactions, paralysis, and death. In addition, the patient was informed of those risks and complications associated to Spine-related procedures, such as failure to decrease pain; infection (i.e.: Meningitis,  epidural or intraspinal abscess); bleeding (i.e.: epidural hematoma, subarachnoid hemorrhage, or any other type of intraspinal or peri-dural bleeding); organ or nerve damage (i.e.: Any type of peripheral  nerve, nerve root, or spinal cord injury) with subsequent damage to sensory, motor, and/or autonomic systems, resulting in permanent pain, numbness, and/or weakness of one or several areas of the body; allergic reactions; (i.e.: anaphylactic reaction); and/or death. Furthermore, the patient was informed of those risks and complications associated with the medications. These include, but are not limited to: allergic reactions (i.e.: anaphylactic or anaphylactoid reaction(s)); adrenal axis suppression; blood sugar elevation that in diabetics may result in ketoacidosis or comma; water retention that in patients with history of congestive heart failure may result in shortness of breath, pulmonary edema, and decompensation with resultant heart failure; weight gain; swelling or edema; medication-induced neural toxicity; particulate matter embolism and blood vessel occlusion with resultant organ, and/or nervous system infarction; and/or aseptic necrosis of one or more joints. Finally, the patient was informed that Medicine is not an exact science; therefore, there is also the possibility of unforeseen or unpredictable risks and/or possible complications that may result in a catastrophic outcome. The patient indicated having understood very clearly. We have given the patient no guarantees and we have made no promises. Enough time was given to the patient to ask questions, all of which were answered to the patient's satisfaction. Lance Roman has indicated that he wanted to continue with the procedure. Attestation: I, the ordering provider, attest that I have discussed with the patient the benefits, risks, side-effects, alternatives, likelihood of achieving goals, and potential problems during recovery for the procedure  that I have provided informed consent. Date  Time: 08/17/2017  9:01 AM  Pre-Procedure Preparation:  Monitoring: As per clinic protocol. Respiration, ETCO2, SpO2, BP, heart rate and rhythm monitor placed and checked for adequate function Safety Precautions: Patient was assessed for positional comfort and pressure points before starting the procedure. Time-out: I initiated and conducted the "Time-out" before starting the procedure, as per protocol. The patient was asked to participate by confirming the accuracy of the "Time Out" information. Verification of the correct person, site, and procedure were performed and confirmed by me, the nursing staff, and the patient. "Time-out" conducted as per Joint Commission's Universal Protocol (UP.01.01.01). Time: 0946  Description of Procedure:       Position: Prone Laterality: Bilateral. The procedure was performed in identical fashion on both sides. Levels:  L2, L3, L4, L5, & S1 Medial Branch Level(s) Area Prepped: Posterior Lumbosacral Region Prepping solution: ChloraPrep (2% chlorhexidine gluconate and 70% isopropyl alcohol) Safety Precautions: Aspiration looking for blood return was conducted prior to all injections. At no point did we inject any substances, as a needle was being advanced. Before injecting, the patient was told to immediately notify me if he was experiencing any new onset of "ringing in the ears, or metallic taste in the mouth". No attempts were made at seeking any paresthesias. Safe injection practices and needle disposal techniques used. Medications properly checked for expiration dates. SDV (single dose vial) medications used. After the completion of the procedure, all disposable equipment used was discarded in the proper designated medical waste containers. Local Anesthesia: Protocol guidelines were followed. The patient was positioned over the fluoroscopy table. The area was prepped in the usual manner. The time-out was completed. The  target area was identified using fluoroscopy. A 12-in long, straight, sterile hemostat was used with fluoroscopic guidance to locate the targets for each level blocked. Once located, the skin was marked with an approved surgical skin marker. Once all sites were marked, the skin (epidermis, dermis, and hypodermis), as well as deeper tissues (fat, connective tissue and  muscle) were infiltrated with a small amount of a short-acting local anesthetic, loaded on a 10cc syringe with a 25G, 1.5-in  Needle. An appropriate amount of time was allowed for local anesthetics to take effect before proceeding to the next step. Local Anesthetic: Lidocaine 2.0% The unused portion of the local anesthetic was discarded in the proper designated containers. Technical explanation of process:  L2 Medial Branch Nerve Block (MBB): The target area for the L2 medial branch is at the junction of the postero-lateral aspect of the superior articular process and the superior, posterior, and medial edge of the transverse process of L3. Under fluoroscopic guidance, a Quincke needle was inserted until contact was made with os over the superior postero-lateral aspect of the pedicular shadow (target area). After negative aspiration for blood, 0.5 mL of the nerve block solution was injected without difficulty or complication. The needle was removed intact. L3 Medial Branch Nerve Block (MBB): The target area for the L3 medial branch is at the junction of the postero-lateral aspect of the superior articular process and the superior, posterior, and medial edge of the transverse process of L4. Under fluoroscopic guidance, a Quincke needle was inserted until contact was made with os over the superior postero-lateral aspect of the pedicular shadow (target area). After negative aspiration for blood, 0.5 mL of the nerve block solution was injected without difficulty or complication. The needle was removed intact. L4 Medial Branch Nerve Block (MBB): The  target area for the L4 medial branch is at the junction of the postero-lateral aspect of the superior articular process and the superior, posterior, and medial edge of the transverse process of L5. Under fluoroscopic guidance, a Quincke needle was inserted until contact was made with os over the superior postero-lateral aspect of the pedicular shadow (target area). After negative aspiration for blood, 0.5 mL of the nerve block solution was injected without difficulty or complication. The needle was removed intact. L5 Medial Branch Nerve Block (MBB): The target area for the L5 medial branch is at the junction of the postero-lateral aspect of the superior articular process and the superior, posterior, and medial edge of the sacral ala. Under fluoroscopic guidance, a Quincke needle was inserted until contact was made with os over the superior postero-lateral aspect of the pedicular shadow (target area). After negative aspiration for blood, 0.5 mL of the nerve block solution was injected without difficulty or complication. The needle was removed intact. S1 Medial Branch Nerve Block (MBB): The target area for the S1 medial branch is at the posterior and inferior 6 o'clock position of the L5-S1 facet joint. Under fluoroscopic guidance, the Quincke needle inserted for the L5 MBB was redirected until contact was made with os over the inferior and postero aspect of the sacrum, at the 6 o' clock position under the L5-S1 facet joint (Target area). After negative aspiration for blood, 0.5 mL of the nerve block solution was injected without difficulty or complication. The needle was removed intact. Procedural Needles: 22-gauge, 3.5-inch, Quincke needles used for all levels. Nerve block solution: 0.2% PF-Ropivacaine + Triamcinolone (40 mg/mL) diluted to a final concentration of 4 mg of Triamcinolone/mL of Ropivacaine The unused portion of the solution was discarded in the proper designated containers.  Once the entire  procedure was completed, the treated area was cleaned, making sure to leave some of the prepping solution back to take advantage of its long term bactericidal properties.   Illustration of the posterior view of the lumbar spine and the posterior neural structures.  Laminae of L2 through S1 are labeled. DPRL5, dorsal primary ramus of L5; DPRS1, dorsal primary ramus of S1; DPR3, dorsal primary ramus of L3; FJ, facet (zygapophyseal) joint L3-L4; I, inferior articular process of L4; LB1, lateral branch of dorsal primary ramus of L1; IAB, inferior articular branches from L3 medial branch (supplies L4-L5 facet joint); IBP, intermediate branch plexus; MB3, medial branch of dorsal primary ramus of L3; NR3, third lumbar nerve root; S, superior articular process of L5; SAB, superior articular branches from L4 (supplies L4-5 facet joint also); TP3, transverse process of L3.  Vitals:   08/17/17 0958 08/17/17 1004 08/17/17 1014 08/17/17 1024  BP: 128/63 116/70 115/69 106/63  Pulse:      Resp: Temp:      TempSrc:      SpO2: 98% 98% 98% 98%  Weight:      Height:        Start Time: 0946 hrs. End Time: 0958 hrs.  Imaging Guidance (Spinal):  Type of Imaging Technique: Fluoroscopy Guidance (Spinal) Indication(s): Assistance in needle guidance and placement for procedures requiring needle placement in or near specific anatomical locations not easily accessible without such assistance. Exposure Time: Please see nurses notes. Contrast: None used. Fluoroscopic Guidance: I was personally present during the use of fluoroscopy. "Tunnel Vision Technique" used to obtain the best possible view of the target area. Parallax error corrected before commencing the procedure. "Direction-depth-direction" technique used to introduce the needle under continuous pulsed fluoroscopy. Once target was reached, antero-posterior, oblique, and lateral fluoroscopic projection used confirm needle placement in all planes. Images  permanently stored in EMR. Interpretation: No contrast injected. I personally interpreted the imaging intraoperatively. Adequate needle placement confirmed in multiple planes. Permanent images saved into the patient's record.  Antibiotic Prophylaxis:   Anti-infectives (From admission, onward)   None     Indication(s): None identified  Post-operative Assessment:  Post-procedure Vital Signs:  Pulse/HCG Rate: 61(!) 58 Temp: 97.7 F (36.5 C) Resp: 14 BP: 106/63 SpO2: 98 %  EBL: None  Complications: No immediate post-treatment complications observed by team, or reported by patient.  Note: The patient tolerated the entire procedure well. A repeat set of vitals were taken after the procedure and the patient was kept under observation following institutional policy, for this type of procedure. Post-procedural neurological assessment was performed, showing return to baseline, prior to discharge. The patient was provided with post-procedure discharge instructions, including a section on how to identify potential problems. Should any problems arise concerning this procedure, the patient was given instructions to immediately contact us, at any time, without hesitation. In any case, we plan to contact the patient by telephone for a follow-up status report regarding this interventional procedure.  Comments:  No additional relevant information.  Plan of Care    Imaging Orders     DG C-Arm 1-60 Min-No Report  Procedure Orders     LUMBAR FACET(MEDIAL BRANCH NERVE BLOCK) MBNB  Medications ordered for procedure: Meds ordered this encounter  Medications  . lidocaine (XYLOCAINE) 2 % (with pres) injection 400 mg  . midazolam (VERSED) 5 MG/5ML injection 1-2 mg    Make sure Flumazenil is available in the pyxis when using this medication. If oversedation occurs, administer 0.2 mg IV over 15 sec. If after 45 sec no response, administer 0.2 mg again over 1 min; may repeat at 1 min intervals; not to  exceed 4 doses (1 mg)  . fentaNYL (SUBLIMAZE) injection 25-50 mcg    Make sure Narcan  is available in the pyxis when using this medication. In the event of respiratory depression (RR< 8/min): Titrate NARCAN (naloxone) in increments of 0.1 to 0.2 mg IV at 2-3 minute intervals, until desired degree of reversal.  . lactated ringers infusion 1,000 mL  . ropivacaine (PF) 2 mg/mL (0.2%) (NAROPIN) injection 18 mL  . triamcinolone acetonide (KENALOG-40) injection 80 mg   Medications administered: We administered lidocaine, midazolam, fentaNYL, lactated ringers, ropivacaine (PF) 2 mg/mL (0.2%), and triamcinolone acetonide.  See the medical record for exact dosing, route, and time of administration.  New Prescriptions   No medications on file   Disposition: Discharge home  Discharge Date & Time: 08/17/2017; 1025 hrs.   Physician-requested Follow-up: Return for post-procedure eval (2 wks), w/ Dr. Laban Emperor.  Future Appointments  Date Time Provider Department Center  09/11/2017 10:15 AM Delano Metz, MD [facility] None   Primary Care Physician: Myrlene Broker, MD Location: [facility] Note by: Oswaldo Done, MD Date: 08/17/2017; Time: 10:45 AM  Disclaimer:  Medicine is not an Visual merchandiser. The only guarantee in medicine is that nothing is guaranteed. It is important to note that the decision to proceed with this intervention was based on the information collected from the patient. The Data and conclusions were drawn from the patient's questionnaire, the interview, and the physical examination. Because the information was provided in large part by the patient, it cannot be guaranteed that it has not been purposely or unconsciously manipulated. Every effort has been made to obtain as much relevant data as possible for this evaluation. It is important to note that the conclusions that lead to this procedure are derived in large part from the available data.  Always take into account that the treatment will also be dependent on availability of resources and existing treatment guidelines, considered by other Pain Management Practitioners as being common knowledge and practice, at the time of the intervention. For Medico-Legal purposes, it is also important to point out that variation in procedural techniques and pharmacological choices are the acceptable norm. The indications, contraindications, technique, and results of the above procedure should only be interpreted and judged by a Board-Certified Interventional Pain Specialist with extensive familiarity and expertise in the same exact procedure and technique.

## 2017-08-17 NOTE — Patient Instructions (Signed)

## 2017-08-17 NOTE — Progress Notes (Signed)
Safety precautions to be maintained throughout the outpatient stay will include: orient to surroundings, keep bed in low position, maintain call bell within reach at all times, provide assistance with transfer out of bed and ambulation.  

## 2017-08-18 ENCOUNTER — Telehealth: Payer: Self-pay

## 2017-08-18 NOTE — Telephone Encounter (Signed)
Post procedure phone call.  Patients wife states that he is doing well and gone to work.

## 2017-08-22 ENCOUNTER — Telehealth: Payer: Self-pay | Admitting: *Deleted

## 2017-08-23 ENCOUNTER — Other Ambulatory Visit: Payer: Self-pay | Admitting: Student

## 2017-08-24 ENCOUNTER — Other Ambulatory Visit: Payer: Self-pay | Admitting: Pain Medicine

## 2017-08-24 DIAGNOSIS — G894 Chronic pain syndrome: Secondary | ICD-10-CM

## 2017-08-24 NOTE — Telephone Encounter (Signed)
Spoke with pharmacy, this was sent mistakenly

## 2017-08-24 NOTE — Telephone Encounter (Signed)
Called to pharmacy, this was sent by mistake LP

## 2017-08-28 ENCOUNTER — Ambulatory Visit (HOSPITAL_COMMUNITY)
Admission: RE | Admit: 2017-08-28 | Discharge: 2017-08-28 | Disposition: A | Discharge status: Home / Self Care | Payer: No Typology Code available for payment source | Source: Ambulatory Visit | Attending: Pain Medicine | Admitting: Pain Medicine

## 2017-08-28 DIAGNOSIS — M5442 Lumbago with sciatica, left side: Secondary | ICD-10-CM | POA: Diagnosis present

## 2017-08-28 DIAGNOSIS — M5136 Other intervertebral disc degeneration, lumbar region: Secondary | ICD-10-CM

## 2017-08-28 DIAGNOSIS — Z9689 Presence of other specified functional implants: Secondary | ICD-10-CM | POA: Diagnosis not present

## 2017-08-28 DIAGNOSIS — M961 Postlaminectomy syndrome, not elsewhere classified: Secondary | ICD-10-CM

## 2017-08-28 DIAGNOSIS — G8929 Other chronic pain: Secondary | ICD-10-CM | POA: Insufficient documentation

## 2017-08-28 DIAGNOSIS — M5126 Other intervertebral disc displacement, lumbar region: Secondary | ICD-10-CM | POA: Diagnosis not present

## 2017-08-28 DIAGNOSIS — M4310 Spondylolisthesis, site unspecified: Secondary | ICD-10-CM

## 2017-08-28 DIAGNOSIS — Z981 Arthrodesis status: Secondary | ICD-10-CM | POA: Insufficient documentation

## 2017-08-28 DIAGNOSIS — M4316 Spondylolisthesis, lumbar region: Secondary | ICD-10-CM | POA: Diagnosis not present

## 2017-08-28 DIAGNOSIS — M51369 Other intervertebral disc degeneration, lumbar region without mention of lumbar back pain or lower extremity pain: Secondary | ICD-10-CM

## 2017-08-28 MED ORDER — LIDOCAINE HCL (PF) 1 % IJ SOLN
INTRAMUSCULAR | Status: AC
  Administered 2017-08-28: 5 mL via INTRADERMAL
  Filled 2017-08-28: qty 5

## 2017-08-28 MED ORDER — ONDANSETRON HCL 4 MG/2ML IJ SOLN: 4.0000 mg | Freq: Four times a day (QID) | INTRAMUSCULAR | Status: DC | PRN

## 2017-08-28 MED ORDER — IOPAMIDOL (ISOVUE-M 200) INJECTION 41%
20.0000 mL | Freq: Once | INTRAMUSCULAR | Status: AC
  Administered 2017-08-28: 16 mL via INTRA_ARTICULAR

## 2017-08-28 MED ORDER — IOPAMIDOL (ISOVUE-M 200) INJECTION 41%
INTRAMUSCULAR | Status: AC
  Administered 2017-08-28: 16 mL via INTRA_ARTICULAR
  Filled 2017-08-28: qty 10

## 2017-08-28 MED ORDER — LIDOCAINE HCL (PF) 1 % IJ SOLN
5.0000 mL | Freq: Once | INTRAMUSCULAR | Status: AC
  Administered 2017-08-28: 5 mL via INTRADERMAL

## 2017-08-28 NOTE — Discharge Instructions (Signed)

## 2017-09-11 ENCOUNTER — Encounter: Payer: Self-pay | Admitting: Pain Medicine

## 2017-09-11 ENCOUNTER — Other Ambulatory Visit: Payer: Self-pay

## 2017-09-11 ENCOUNTER — Ambulatory Visit: Payer: No Typology Code available for payment source | Attending: Pain Medicine | Admitting: Pain Medicine

## 2017-09-11 VITALS — BP 139/80 | HR 66 | Temp 98.4°F | Ht 70.0 in | Wt 190.0 lb

## 2017-09-11 DIAGNOSIS — Z981 Arthrodesis status: Secondary | ICD-10-CM | POA: Insufficient documentation

## 2017-09-11 DIAGNOSIS — K219 Gastro-esophageal reflux disease without esophagitis: Secondary | ICD-10-CM | POA: Insufficient documentation

## 2017-09-11 DIAGNOSIS — Z9889 Other specified postprocedural states: Secondary | ICD-10-CM | POA: Diagnosis not present

## 2017-09-11 DIAGNOSIS — Z8249 Family history of ischemic heart disease and other diseases of the circulatory system: Secondary | ICD-10-CM | POA: Diagnosis not present

## 2017-09-11 DIAGNOSIS — M961 Postlaminectomy syndrome, not elsewhere classified: Secondary | ICD-10-CM

## 2017-09-11 DIAGNOSIS — M5442 Lumbago with sciatica, left side: Secondary | ICD-10-CM

## 2017-09-11 DIAGNOSIS — Z791 Long term (current) use of non-steroidal anti-inflammatories (NSAID): Secondary | ICD-10-CM | POA: Diagnosis not present

## 2017-09-11 DIAGNOSIS — Z79899 Other long term (current) drug therapy: Secondary | ICD-10-CM | POA: Diagnosis not present

## 2017-09-11 DIAGNOSIS — M5117 Intervertebral disc disorders with radiculopathy, lumbosacral region: Secondary | ICD-10-CM | POA: Diagnosis not present

## 2017-09-11 DIAGNOSIS — G8929 Other chronic pain: Secondary | ICD-10-CM

## 2017-09-11 DIAGNOSIS — M47816 Spondylosis without myelopathy or radiculopathy, lumbar region: Secondary | ICD-10-CM

## 2017-09-11 DIAGNOSIS — M47819 Spondylosis without myelopathy or radiculopathy, site unspecified: Secondary | ICD-10-CM | POA: Insufficient documentation

## 2017-09-11 DIAGNOSIS — T85192S Other mechanical complication of implanted electronic neurostimulator (electrode) of spinal cord, sequela: Secondary | ICD-10-CM | POA: Diagnosis not present

## 2017-09-11 DIAGNOSIS — Z87891 Personal history of nicotine dependence: Secondary | ICD-10-CM | POA: Insufficient documentation

## 2017-09-11 DIAGNOSIS — E785 Hyperlipidemia, unspecified: Secondary | ICD-10-CM | POA: Insufficient documentation

## 2017-09-11 DIAGNOSIS — L723 Sebaceous cyst: Secondary | ICD-10-CM | POA: Insufficient documentation

## 2017-09-11 DIAGNOSIS — Z833 Family history of diabetes mellitus: Secondary | ICD-10-CM | POA: Diagnosis not present

## 2017-09-11 DIAGNOSIS — Z79891 Long term (current) use of opiate analgesic: Secondary | ICD-10-CM | POA: Diagnosis not present

## 2017-09-11 DIAGNOSIS — M47817 Spondylosis without myelopathy or radiculopathy, lumbosacral region: Secondary | ICD-10-CM

## 2017-09-11 DIAGNOSIS — M792 Neuralgia and neuritis, unspecified: Secondary | ICD-10-CM

## 2017-09-11 MED ORDER — GABAPENTIN 100 MG PO CAPS: 100.0000 mg | ORAL_CAPSULE | Freq: Every day | ORAL | 1 refills | Status: DC

## 2017-09-11 NOTE — Progress Notes (Signed)
Patient's Name: Lance Roman  MRN: [id number]  Referring Provider: Hoyt Koch, *  DOB: [birth year]  PCP: Hoyt Koch, MD  DOS: 09/11/2017  Note by: Gaspar Cola, MD  Service setting: Ambulatory outpatient  Specialty: Interventional Pain Management  Location: [facility] (AMB) Pain Management Facility    Patient type: Established   Primary Reason(s) for Visit: Encounter for post-procedure evaluation of chronic illness with mild to moderate exacerbation CC: Back Pain (lower)  HPI  Lance Roman is a 57 y.o. year old, male patient, who comes today for a post-procedure evaluation. He has GERD; Chronic low back pain (Primary Area of Pain) (Bilateral) (L>R); Pharmacologic therapy; Infected sebaceous cyst; Disorder of skeletal system; Problems influencing health status; Opiate use (150 MME/day); Long term prescription opiate use; Chronic pain syndrome; Muscle cramps; Long-term current use of opiate analgesic; Chronic lower extremity pain (Secondary Area of Pain) (Left); Chronic lumbar radicular pain (L5) (Left); Retrolisthesis (4-5 mm) (L3 over L4) (L2 over L3; Failed back surgical syndrome (Fusion L4-S1); Lumbar facet hypertrophy (Bilateral); Lumbar facet syndrome (Bilateral) (L>R); Spinal cord stimulator dysfunction (fractured leads); Lumbar facet osteoarthritis (Bilateral); Osteoarthritis of lumbar spine; Spondylosis without myelopathy or radiculopathy, lumbosacral region; Other specified dorsopathies, sacral and sacrococcygeal region; Chronic hip pain (Left); Chronic knee pain (Left); Other intervertebral disc degeneration, lumbar region; and Neurogenic pain on their problem list. His primarily concern today is the Back Pain (lower)  Pain Assessment: Location: Lower Back Radiating: Pain radiates down back of both legs and feet Onset: More than a month ago Duration: Chronic pain Quality: Aching, Cramping, Burning Severity: 2 /10 (subjective, self-reported pain score)  Note: Reported  level is compatible with observation.                         When using our objective Pain Scale, levels between 6 and 10/10 are said to belong in an emergency room, as it progressively worsens from a 6/10, described as severely limiting, requiring emergency care not usually available at an outpatient pain management facility. At a 6/10 level, communication becomes difficult and requires great effort. Assistance to reach the emergency department may be required. Facial flushing and profuse sweating along with potentially dangerous increases in heart rate and blood pressure will be evident. Timing: Constant Modifying factors: Moving around, medications and repositioning in the bed BP: 139/80  HR: 66  Lance Roman comes in today for post-procedure evaluation after the treatment done on 08/24/2017.  Further details on both, my assessment(s), as well as the proposed treatment plan, please see below.  Post-Procedure Assessment  08/24/2017 Procedure: Diagnostic bilateral lumbar facet block #2 under fluoroscopic guidance and IV sedation Pre-procedure pain score:  6/10 Post-procedure pain score: 0/10 (100% relief) Influential Factors: BMI: 27.26 kg/m Intra-procedural challenges: None observed.         Assessment challenges: None detected.              Reported side-effects: None.        Post-procedural adverse reactions or complications: None reported         Sedation: Sedation provided. When no sedatives are used, the analgesic levels obtained are directly associated to the effectiveness of the local anesthetics. However, when sedation is provided, the level of analgesia obtained during the initial 1 hour following the intervention, is believed to be the result of a combination of factors. These factors may include, but are not limited to: 1. The effectiveness of the local anesthetics used. 2. The effects  of the analgesic(s) and/or anxiolytic(s) used. 3. The degree of discomfort experienced by the  patient at the time of the procedure. 4. The patients ability and reliability in recalling and recording the events. 5. The presence and influence of possible secondary gains and/or psychosocial factors. Reported result: Relief experienced during the 1st hour after the procedure: 100 % (Ultra-Short Term Relief)            Interpretative annotation: Clinically appropriate result. Analgesia during this period is likely to be Local Anesthetic and/or IV Sedative (Analgesic/Anxiolytic) related.          Effects of local anesthetic: The analgesic effects attained during this period are directly associated to the localized infiltration of local anesthetics and therefore cary significant diagnostic value as to the etiological location, or anatomical origin, of the pain. Expected duration of relief is directly dependent on the pharmacodynamics of the local anesthetic used. Long-acting (4-6 hours) anesthetics used.  Reported result: Relief during the next 4 to 6 hour after the procedure: 90 % (Short-Term Relief)            Interpretative annotation: Clinically appropriate result. Analgesia during this period is likely to be Local Anesthetic-related.          Long-term benefit: Defined as the period of time past the expected duration of local anesthetics (1 hour for short-acting and 4-6 hours for long-acting). With the possible exception of prolonged sympathetic blockade from the local anesthetics, benefits during this period are typically attributed to, or associated with, other factors such as analgesic sensory neuropraxia, antiinflammatory effects, or beneficial biochemical changes provided by agents other than the local anesthetics.  Reported result: Extended relief following procedure: 90 % (Long-Term Relief)            Interpretative annotation: Clinically appropriate result. Good relief. No permanent benefit expected. Inflammation plays a part in the etiology to the pain.          Current benefits: Defined  as reported results that persistent at this point in time.   Analgesia: 90 % Lance Roman reports improvement of axial symptoms. Function: Lance Roman reports improvement in function ROM: Lance Roman reports improvement in ROM Interpretative annotation: Ongoing benefit. Therapeutic benefit observed. Effective therapeutic approach. Benefit could be steroid-related.  Interpretation: Results would suggest a successful diagnostic intervention.                  Plan:  Proceed with Radiofrequency Ablation for the purpose of attaining long-term benefits.       "The patient has failed to respond to conservative therapies including over-the-counter medications, anti-inflammatories, muscle relaxants, membrane stabilizers, opioids, physical therapy modalities such as heat and ice, as well as more invasive techniques such as nerve blocks. Because Mr. Sukhu did attain more than 50% relief of the pain during a series of diagnostic blocks conducted in separate occasions, I believe it is medically necessary to proceed with Radiofrequency Ablation, in order to attempt gaining longer relief.  Laboratory Chemistry  Inflammation Markers (CRP: Acute Phase) (ESR: Chronic Phase) Lab Results  Component Value Date   CRP 2.5 12/12/2016   ESRSEDRATE 10 12/12/2016                         Renal Markers Lab Results  Component Value Date   BUN 20 12/12/2016   CREATININE 0.74 (L) 12/12/2016   BCR 27 (H) 12/12/2016   GFRAA 120 12/12/2016   GFRNONAA 104 12/12/2016  Hepatic Markers Lab Results  Component Value Date   AST 21 12/12/2016   ALT 12 12/30/2014   ALBUMIN 4.5 12/12/2016   HCVAB NEGATIVE 12/30/2014                        Hematology Parameters Lab Results  Component Value Date   PLT 167.0 03/03/2011   HGB 13.1 03/03/2011   HCT 38.5 (L) 03/03/2011                        Note: Lab results reviewed.  Recent Diagnostic Imaging Results  CT LUMBAR SPINE WO CONTRAST CLINICAL  DATA:  Previous lumbar fusion. Recurrent low back pain extending into the lower extremities bilaterally. Left S1 radiculopathy.  EXAM: LUMBAR MYELOGRAM  FLUOROSCOPY TIME:  Radiation Exposure Index (as provided by the fluoroscopic device): 4,492 uGy*m2  Fluoroscopy Time:  48 seconds  Number of Acquired Images:  14  PROCEDURE: After thorough discussion of risks and benefits of the procedure including bleeding, infection, injury to nerves, blood vessels, adjacent structures as well as headache and CSF leak, written and oral informed consent was obtained. Consent was obtained by Dr. San Morelle. Time out form was completed.  Patient was positioned prone on the fluoroscopy table. Local anesthesia was provided with 1% lidocaine without epinephrine after prepped and draped in the usual sterile fashion. Puncture was performed at L1-2 using a 3 1/2 inch 22-gauge spinal needle via right paramedian approach. Using a single pass through the dura, the needle was placed within the thecal sac, with return of clear CSF. 15 mL of Isovue M-200 was injected into the thecal sac, with normal opacification of the nerve roots and cauda equina consistent with free flow within the subarachnoid space.  I personally performed the lumbar puncture and administered the intrathecal contrast. I also personally supervised acquisition of the myelogram images.  TECHNIQUE: Contiguous axial images were obtained through the Lumbar spine after the intrathecal infusion of infusion. Coronal and sagittal reconstructions were obtained of the axial image sets.  COMPARISON:  None  FINDINGS: LUMBAR MYELOGRAM FINDINGS:  Lumbar fusion is noted at L4-5 and L5-S1. Spinal cord stimulator enters at T12-L1. The stimulator wires are fragmented. Fusion hardware is intact.  Mild subarticular narrowing is present bilaterally at L1-2 and on the right at L2-3.  Lower lumbar nerve roots fill normally on both  sides.  Slight retrolisthesis is present at L3-4 and L2-3. This is exaggerated with standing. Increased uncovering of broad-based disc protrusions is present at L2-3 and L3-4 with standing. The retrolisthesis is worse with extension and reduced in flexion. The disc disease with standing and extension is greatest at L3-4.  CT LUMBAR MYELOGRAM FINDINGS:  The lumbar spine is imaged from T11-12 through S1-2.  Schmorl's nodes are present at T11-12 and T12-L1. Vertebral body heights are normal. There is solid fusion anteriorly and posteriorly at L4-5 and L5-S1.  Slight retrolisthesis is present at L3-4. AP alignment is otherwise anatomic.  Limited imaging of the abdomen is unremarkable.  L1-2: Facet hypertrophy is worse on the right. No significant focal disc protrusion or stenosis is present.  L2-3: A mild broad-based disc protrusion is present. Moderate facet hypertrophy is noted bilaterally. Focal stenosis is present on the supine images.  L3-4: A left paramedian central disc protrusion is present. Moderate facet hypertrophy is noted. Mild foraminal narrowing is worse on the right. The central canal is patent.  L4-5: Left laminectomy and facetectomy are noted.  Solid fusion is present anteriorly and posteriorly. No residual or recurrent stenosis is present.  L5-S1: Bilateral laminectomies are noted. Solid fusion is present anteriorly and posteriorly. The nerve roots are distributed peripherally. No residual stenosis is present.  IMPRESSION: 1. Solid fusion at L4-5 and L5-S1 without residual or recurrent stenosis at these levels. 2. Adjacent level disease at L3-4. Central disc protrusion and bilateral facet hypertrophy is noted on the CT scan. Retrolisthesis is exaggerated when the patient stands and extends contributing to dynamic central canal and likely foraminal stenosis. 3. More mild retrolisthesis at L2-3 is also worse with standing and extension. 4. Mild facet  hypertrophy at L1-2 without significant stenosis. 5. Spinal cord stimulator is in place. The leads are fragmented as previously noted.  Electronically Signed   By: San Morelle M.D.   On: 08/28/2017 11:12   Complexity Note: I personally reviewed the fluoroscopic imaging of the procedure.                        Meds   Current Outpatient Medications:  .  celecoxib (CELEBREX) 200 MG capsule, Take 1 capsule (200 mg total) by mouth 2 (two) times daily., Disp: 180 capsule, Rfl: 1 .  docusate sodium (COLACE) 100 MG capsule, Take 200 mg by mouth daily., Disp: , Rfl:  .  gabapentin (NEURONTIN) 100 MG capsule, Take 1-3 capsules (100-300 mg total) by mouth at bedtime. Follow written titration schedule., Disp: 90 capsule, Rfl: 1 .  ibuprofen (ADVIL,MOTRIN) 200 MG tablet, Take 400 mg by mouth every 6 (six) hours as needed for moderate pain. , Disp: , Rfl:  .  morphine (MS CONTIN) 30 MG 12 hr tablet, Take 1 tablet (30 mg total) by mouth every 12 (twelve) hours., Disp: 60 tablet, Rfl: 0 .  [START ON 09/22/2017] morphine (MS CONTIN) 30 MG 12 hr tablet, Take 1 tablet (30 mg total) by mouth every 12 (twelve) hours., Disp: 60 tablet, Rfl: 0 .  Multiple Vitamin (MULTIVITAMIN) capsule, Take 1 capsule by mouth daily., Disp: , Rfl:  .  olopatadine (PATANOL) 0.1 % ophthalmic solution, Place 1 drop into both eyes as needed for allergies. , Disp: , Rfl:  .  oxyCODONE (OXY IR/ROXICODONE) 5 MG immediate release tablet, Take 1 tablet (5 mg total) by mouth 3 (three) times daily for 7 days. Max: 3/day, Disp: 21 tablet, Rfl: 0 .  [START ON 09/13/2017] oxyCODONE (OXY IR/ROXICODONE) 5 MG immediate release tablet, Take 1 tablet (5 mg total) by mouth 2 (two) times daily for 7 days. Max: 2/day, Disp: 14 tablet, Rfl: 0 .  [START ON 09/20/2017] oxyCODONE (OXY IR/ROXICODONE) 5 MG immediate release tablet, Take 1 tablet (5 mg total) by mouth daily for 7 days. Max: 1/day, Disp: 7 tablet, Rfl: 0 .  esomeprazole (NEXIUM) 40 MG  capsule, Take 1 capsule (40 mg total) by mouth 2 (two) times daily before a meal., Disp: 180 capsule, Rfl: 0  ROS  Constitutional: Denies any fever or chills Gastrointestinal: No reported hemesis, hematochezia, vomiting, or acute GI distress Musculoskeletal: Denies any acute onset joint swelling, redness, loss of ROM, or weakness Neurological: No reported episodes of acute onset apraxia, aphasia, dysarthria, agnosia, amnesia, paralysis, loss of coordination, or loss of consciousness  Allergies  Mr. Whitby has No Known Allergies.  [place]  Drug: Mr. Dever  reports that he does not use drugs. Alcohol:  reports that he drinks about 1.8 oz of alcohol per week. Tobacco:  reports that he has quit smoking.  His smokeless tobacco use includes chew. Medical:  has a past medical history of Allergic rhinitis, Chronic pain syndrome, DDD (degenerative disc disease), lumbosacral, GERD (gastroesophageal reflux disease), Hyperlipidemia, Osteoarthritis, and S/P appendectomy. Surgical: Mr. Niccoli  has a past surgical history that includes Appendectomy; repairs of bilateral ankle fractures; Spinal fusion (N/A); and Spinal cord stimulator implant (N/A). Family: family history includes Diabetes in his maternal grandmother; Heart disease in his father.  Constitutional Exam  General appearance: Well nourished, well developed, and well hydrated. In no apparent acute distress Vitals:   09/11/17 1026 09/11/17 1027  BP:  139/80  Pulse:  66  Temp:  98.4 F (36.9 C)  SpO2:  100%  Weight: 190 lb (86.2 kg)   Height: 5' 10"  (1.778 m)    BMI Assessment: Estimated body mass index is 27.26 kg/m as calculated from the following:   Height as of this encounter: 5' 10"  (1.778 m).   Weight as of this encounter: 190 lb (86.2 kg).  BMI interpretation table: BMI level Category Range association with higher incidence of chronic pain  <18 kg/m2 Underweight   18.5-24.9 kg/m2 Ideal body weight   25-29.9 kg/m2 Overweight  Increased incidence by 20%  30-34.9 kg/m2 Obese (Class I) Increased incidence by 68%  35-39.9 kg/m2 Severe obesity (Class II) Increased incidence by 136%  >40 kg/m2 Extreme obesity (Class III) Increased incidence by 254%   Patient's current BMI Ideal Body weight  Body mass index is 27.26 kg/m. Ideal body weight: 73 kg (160 lb 15 oz) Adjusted ideal body weight: 78.3 kg (172 lb 9 oz)   BMI Readings from Last 4 Encounters:  09/11/17 27.26 kg/m  08/28/17 27.26 kg/m  08/17/17 27.26 kg/m  08/09/17 27.26 kg/m   Wt Readings from Last 4 Encounters:  09/11/17 190 lb (86.2 kg)  08/28/17 190 lb (86.2 kg)  08/17/17 190 lb (86.2 kg)  08/09/17 190 lb (86.2 kg)  Psych/Mental status: Alert, oriented x 3 (person, place, & time)       Eyes: PERLA Respiratory: No evidence of acute respiratory distress  Cervical Spine Area Exam  Skin & Axial Inspection: No masses, redness, edema, swelling, or associated skin lesions Alignment: Symmetrical Functional ROM: Unrestricted ROM      Stability: No instability detected Muscle Tone/Strength: Functionally intact. No obvious neuro-muscular anomalies detected. Sensory (Neurological): Unimpaired Palpation: No palpable anomalies              Upper Extremity (UE) Exam    Side: Right upper extremity  Side: Left upper extremity  Skin & Extremity Inspection: Skin color, temperature, and hair growth are WNL. No peripheral edema or cyanosis. No masses, redness, swelling, asymmetry, or associated skin lesions. No contractures.  Skin & Extremity Inspection: Skin color, temperature, and hair growth are WNL. No peripheral edema or cyanosis. No masses, redness, swelling, asymmetry, or associated skin lesions. No contractures.  Functional ROM: Unrestricted ROM          Functional ROM: Unrestricted ROM          Muscle Tone/Strength: Functionally intact. No obvious neuro-muscular anomalies detected.  Muscle Tone/Strength: Functionally intact. No obvious neuro-muscular  anomalies detected.  Sensory (Neurological): Unimpaired          Sensory (Neurological): Unimpaired          Palpation: No palpable anomalies              Palpation: No palpable anomalies              Provocative Test(s):  Phalen's  test: deferred Tinel's test: deferred Apley's scratch test (touch opposite shoulder):  Action 1 (Across chest): deferred Action 2 (Overhead): deferred Action 3 (LB reach): deferred   Provocative Test(s):  Phalen's test: deferred Tinel's test: deferred Apley's scratch test (touch opposite shoulder):  Action 1 (Across chest): deferred Action 2 (Overhead): deferred Action 3 (LB reach): deferred    Thoracic Spine Area Exam  Skin & Axial Inspection: No masses, redness, or swelling Alignment: Symmetrical Functional ROM: Unrestricted ROM Stability: No instability detected Muscle Tone/Strength: Functionally intact. No obvious neuro-muscular anomalies detected. Sensory (Neurological): Unimpaired Muscle strength & Tone: No palpable anomalies  Lumbar Spine Area Exam  Skin & Axial Inspection: No masses, redness, or swelling Alignment: Symmetrical Functional ROM: Improved after treatment       Stability: No instability detected Muscle Tone/Strength: Functionally intact. No obvious neuro-muscular anomalies detected. Sensory (Neurological): Improved Palpation: No palpable anomalies       Provocative Tests: Lumbar Hyperextension/rotation test: Improved after treatment       Lumbar quadrant test (Kemp's test): deferred today       Lumbar Lateral bending test: deferred today       Patrick's Maneuver: deferred today                   FABER test: deferred today       Thigh-thrust test: deferred today       S-I compression test: deferred today       S-I distraction test: deferred today        Gait & Posture Assessment  Ambulation: Unassisted Gait: Relatively normal for age and body habitus Posture: WNL   Lower Extremity Exam    Side: Right lower extremity   Side: Left lower extremity  Stability: No instability observed          Stability: No instability observed          Skin & Extremity Inspection: Skin color, temperature, and hair growth are WNL. No peripheral edema or cyanosis. No masses, redness, swelling, asymmetry, or associated skin lesions. No contractures.  Skin & Extremity Inspection: Skin color, temperature, and hair growth are WNL. No peripheral edema or cyanosis. No masses, redness, swelling, asymmetry, or associated skin lesions. No contractures.  Functional ROM: Unrestricted ROM                  Functional ROM: Unrestricted ROM                  Muscle Tone/Strength: Functionally intact. No obvious neuro-muscular anomalies detected.  Muscle Tone/Strength: Functionally intact. No obvious neuro-muscular anomalies detected.  Sensory (Neurological): Unimpaired  Sensory (Neurological): Unimpaired  Palpation: No palpable anomalies  Palpation: No palpable anomalies   Assessment  Primary Diagnosis & Pertinent Problem List: The primary encounter diagnosis was Chronic low back pain (Primary Area of Pain) (Bilateral) (L>R). Diagnoses of Lumbar facet syndrome (Bilateral) (L>R), Lumbar facet osteoarthritis (Bilateral), Lumbar facet hypertrophy (Bilateral), Spondylosis without myelopathy or radiculopathy, lumbosacral region, Spinal cord stimulator dysfunction (fractured leads), Failed back surgical syndrome (Fusion L4-S1), Long term prescription opiate use, and Neurogenic pain were also pertinent to this visit.  Status Diagnosis  Improving Improved Stable 1. Chronic low back pain (Primary Area of Pain) (Bilateral) (L>R)   2. Lumbar facet syndrome (Bilateral) (L>R)   3. Lumbar facet osteoarthritis (Bilateral)   4. Lumbar facet hypertrophy (Bilateral)   5. Spondylosis without myelopathy or radiculopathy, lumbosacral region   6. Spinal cord stimulator dysfunction (fractured leads)   7. Failed back surgical syndrome (  Fusion L4-S1)   8. Long term  prescription opiate use   9. Neurogenic pain     Problems updated and reviewed during this visit: Problem  Neurogenic Pain   Plan of Care  Pharmacotherapy (Medications Ordered): Meds ordered this encounter  Medications  . gabapentin (NEURONTIN) 100 MG capsule    Sig: Take 1-3 capsules (100-300 mg total) by mouth at bedtime. Follow written titration schedule.    Dispense:  90 capsule    Refill:  1    Do not place medication on "Automatic Refill". Fill one day early if pharmacy is closed on scheduled refill date.   Medications administered today: Carrington Olazabal. [place] had no medications administered during this visit.   Procedure Orders     Radiofrequency,Lumbar     Radiofrequency,Lumbar Lab Orders  No laboratory test(s) ordered today   Imaging Orders  No imaging studies ordered today   Referral Orders  No referral(s) requested today   Interventional management options: Planned, scheduled, and/or pending:   Currently undergoing opioid downward tapering.  Therapeutic bilateral lumbar facet RFA #1 (starting with the left side) Therapeutic Revision and replacement of spinal cord stimulator with MRI compatible device.     Considering:   Removal of nonfunctional spinal cord stimulatorfollowed by an MRI of the lumbar spine with and without contrast. Diagnostic bilateral lumbar facet block Possible bilateral lumbar facet RFA Diagnostic caudal epidural steroid injection plus diagnostic epidurogram Possible Racz procedure   Palliative PRN treatment(s):   None at this time   Provider-requested follow-up: Return for RFA (fluoro + sedation): (L) L-FCT RFA.  No future appointments. Primary Care Physician: Hoyt Koch, MD Location: [facility] Note by: Gaspar Cola, MD Date: 09/11/2017; Time: 10:58 AM

## 2017-09-11 NOTE — Patient Instructions (Addendum)
_____________Pre procedure instructions given.  Informed that he has medications to be picked up at the pharmacy.  _______________________________________________________________________________  Preparing for Procedure with Sedation  Instructions: . Oral Intake: Do not eat or drink anything for at least 8 hours prior to your procedure. . Transportation: Public transportation is not allowed. Bring an adult driver. The driver must be physically present in our waiting room before any procedure can be started. Marland Kitchen. Physical Assistance: Bring an adult physically capable of assisting you, in the event you need help. This adult should keep you company at home for at least 6 hours after the procedure. . Blood Pressure Medicine: Take your blood pressure medicine with a sip of water the morning of the procedure. . Blood thinners:  . Diabetics on insulin: Notify the staff so that you can be scheduled 1st case in the morning. If your diabetes requires high dose insulin, take only  of your normal insulin dose the morning of the procedure and notify the staff that you have done so. . Preventing infections: Shower with an antibacterial soap the morning of your procedure. . Build-up your immune system: Take 1000 mg of Vitamin C with every meal (3 times a day) the day prior to your procedure. Marland Kitchen. Antibiotics: Inform the staff if you have a condition or reason that requires you to take antibiotics before dental procedures. . Pregnancy: If you are pregnant, call and cancel the procedure. . Sickness: If you have a cold, fever, or any active infections, call and cancel the procedure. . Arrival: You must be in the facility at least 30 minutes prior to your scheduled procedure. . Children: Do not bring children with you. . Dress appropriately: Bring dark clothing that you would not mind if they get stained. . Valuables: Do not bring any jewelry or valuables.  Procedure appointments are reserved for interventional  treatments only. Marland Kitchen. No Prescription Refills. . No medication changes will be discussed during procedure appointments. . No disability issues will be discussed.  Remember:  Regular Business hours are:  Monday to Thursday 8:00 AM to 4:00 PM  Provider's Schedule: Delano MetzFrancisco Ashaz Robling, MD:  Procedure days: Tuesday and Thursday 7:30 AM to 4:00 PM  Edward JollyBilal Lateef, MD:  Procedure days: Monday and Wednesday 7:30 AM to 4:00 PM ____________________________________________________________________________________________   ____________________________________________________________________________________________  Initial Gabapentin Titration  Medication used: Gabapentin (Generic Name) or Neurontin (Brand Name) 100 mg tablets/capsules  Reasons to stop increasing the dose:  Reason 1: You get good relief of symptoms, in which case there is no need to increase the daily dose any further.    Reason 2: You develop some side effects, such as sleeping all of the time, difficulty concentrating, or becoming disoriented, in which case you need to go down on the dose, to the prior level, where you were not experiencing any side effects. Stay on that dose longer, to allow more time for your body to get use it, before attempting to increase it again.   Steps: Step 1: Start by taking 1 (one) tablet at bedtime x 7 (seven) days.  Step 2: After being on 1 (one) tablet for 7 (seven) days, then increase it to 2 (two) tablets at bedtime for another 7 (seven) days.  Step 3: Next, after being on 2 (two) tablets at bedtime for 7 (seven) days, then increase it to 3 (three) tablets at bedtime, and stay on that dose until you see your doctor.  Reasons to stop increasing the dose: Reason 1: You get good relief of  symptoms, in which case there is no need to increase the daily dose any further.  Reason 2: You develop some side effects, such as sleeping all of the time, difficulty concentrating, or becoming disoriented, in  which case you need to go down on the dose, to the prior level, where you were not experiencing any side effects. Stay on that dose longer, to allow more time for your body to get use it, before attempting to increase it again.  Endpoint: Once you have reached the maximum dose you can tolerate without side-effects, contact your physician so as to evaluate the results of the regimen.   Questions: Feel free to contact us for any questions or problems at [phone] ____________________________________________________________________________________________ Radiofrequency Lesioning Radiofrequency lesioning is a procedure that is performed to relieve pain. The procedure is often used for back, neck, or arm pain. Radiofrequency lesioning involves the use of a machine that creates radio waves to make heat. During the procedure, the heat is applied to the nerve that carries the pain signal. The heat damages the nerve and interferes with the pain signal. Pain relief usually starts about 2 weeks after the procedure and lasts for 6 months to 1 year. Tell a health care provider about:  Any allergies you have.  All medicines you are taking, including vitamins, herbs, eye drops, creams, and over-the-counter medicines.  Any problems you or family members have had with anesthetic medicines.  Any blood disorders you have.  Any surgeries you have had.  Any medical conditions you have.  Whether you are pregnant or may be pregnant. What are the risks? Generally, this is a safe procedure. However, problems may occur, including:  Pain or soreness at the injection site.  Infection at the injection site.  Damage to nerves or blood vessels.  What happens before the procedure?  Ask your health care provider about: ? Changing or stopping your regular medicines. This is especially important if you are taking diabetes medicines or blood thinners. ? Taking medicines such as aspirin and ibuprofen. These  medicines can thin your blood. Do not take these medicines before your procedure if your health care provider instructs you not to.  Follow instructions from your health care provider about eating or drinking restrictions.  Plan to have someone take you home after the procedure.  If you go home right after the procedure, plan to have someone with you for 24 hours. What happens during the procedure?  You will be given one or more of the following: ? A medicine to help you relax (sedative). ? A medicine to numb the area (local anesthetic).  You will be awake during the procedure. You will need to be able to talk with the health care provider during the procedure.  With the help of a type of X-ray (fluoroscopy), the health care provider will insert a radiofrequency needle into the area to be treated.  Next, a wire that carries the radio waves (electrode) will be put through the radiofrequency needle. An electrical pulse will be sent through the electrode to verify the correct nerve. You will feel a tingling sensation, and you may have muscle twitching.  Then, the tissue that is around the needle tip will be heated by an electric current that is passed using the radiofrequency machine. This will numb the nerves.  A bandage (dressing) will be put on the insertion area after the procedure is done. The procedure may vary among health care providers and hospitals. What happens after the procedure?  Your blood pressure, heart rate, breathing rate, and blood oxygen level will be monitored often until the medicines you were given have worn off.  Return to your normal activities as directed by your health care provider. This information is not intended to replace advice given to you by your health care provider. Make sure you discuss any questions you have with your health care provider. Document Released: 11/10/2010 Document Revised: 08/20/2015 Document Reviewed: 04/21/2014 [employer] Interactive  Patient Education  [employer].

## 2017-10-17 NOTE — Progress Notes (Signed)
Patient's Name: Lance Roman  MRN: [id number]  Referring Provider: Hoyt Koch, *  DOB: 1961/02/13  PCP: Hoyt Koch, MD  DOS: 10/18/2017  Note by: Gaspar Cola, MD  Service setting: Ambulatory outpatient  Specialty: Interventional Pain Management  Location: [facility] (AMB) Pain Management Facility    Patient type: Established   Primary Reason(s) for Visit: Encounter for prescription drug management. (Level of risk: moderate)  CC: Back Pain (lower)  HPI  Lance Roman is a 57 y.o. year old, male patient, who comes today for a medication management evaluation. He has GERD; Chronic low back pain (Primary Area of Pain) (Bilateral) (L>R); Pharmacologic therapy; Infected sebaceous cyst; Disorder of skeletal system; Problems influencing health status; Opiate use (150 MME/day); Long term prescription opiate use; Chronic pain syndrome; Muscle cramps; Long-term current use of opiate analgesic; Chronic lower extremity pain (Secondary Area of Pain) (Left); Chronic lumbar radicular pain (L5) (Left); Retrolisthesis (4-5 mm) (L3 over L4) (L2 over L3; Failed back surgical syndrome (Fusion L4-S1); Lumbar facet hypertrophy (Bilateral); Lumbar facet syndrome (Bilateral) (L>R); Spinal cord stimulator dysfunction (fractured leads); Lumbar facet osteoarthritis (Bilateral); Osteoarthritis of lumbar spine; Spondylosis without myelopathy or radiculopathy, lumbosacral region; Other specified dorsopathies, sacral and sacrococcygeal region; Chronic hip pain (Left); Chronic knee pain (Left); Other intervertebral disc degeneration, lumbar region; and Neurogenic pain on their problem list. His primarily concern today is the Back Pain (lower)  Pain Assessment: Location: Lower Back Radiating: Pain radiates down left leg to heel Onset: More than a month ago Duration: Chronic pain Quality: Aching, Shooting, Cramping, Discomfort, Constant Severity: 3 /10 (subjective, self-reported pain score)  Note: Reported  level is compatible with observation.             A 3/10 is viewed as "Moderate" and described as significantly interfering with activities of daily living (ADL). It becomes difficult to feed, bathe, get dressed, get on and off the toilet or to perform personal hygiene functions. Difficult to get in and out of bed or a chair without assistance. Very distracting. With effort, it can be ignored when deeply involved in activities.       When using our objective Pain Scale, levels between 6 and 10/10 are said to belong in an emergency room, as it progressively worsens from a 6/10, described as severely limiting, requiring emergency care not usually available at an outpatient pain management facility. At a 6/10 level, communication becomes difficult and requires great effort. Assistance to reach the emergency department may be required. Facial flushing and profuse sweating along with potentially dangerous increases in heart rate and blood pressure will be evident. Effect on ADL: limits my daily activities Timing: Constant Modifying factors: moving , repositioning BP: 139/82  HR: (!) 59  Lance Roman was last scheduled for an appointment on 09/11/2017 for medication management. During today's appointment we reviewed Lance Roman's chronic pain status, as well as his outpatient medication regimen.  The patient  reports that he does not use drugs. His body mass index is 27.26 kg/m.  Further details on both, my assessment(s), as well as the proposed treatment plan, please see below.  Controlled Substance Pharmacotherapy Assessment REMS (Risk Evaluation and Mitigation Strategy)  Analgesic: Oxycodone 5 mg (downward taper of opioids started on 08/09/2017. Initial opioid: Morphine ER 30 mg every 8 hours + morphine IR 15 mg BID (120MME/day) Initial MME/day: 162m/day Current MME/day: 52.5 mg/day.  BChauncey Fischer RN  10/18/2017  8:52 AM  Sign at close encounter Nursing Pain Medication Assessment:  Safety  precautions to be maintained throughout the outpatient stay will include: orient to surroundings, keep bed in low position, maintain call bell within reach at all times, provide assistance with transfer out of bed and ambulation.  Medication Inspection Compliance: Pill count conducted under aseptic conditions, in front of the patient. Neither the pills nor the bottle was removed from the patient's sight at any time. Once count was completed pills were immediately returned to the patient in their original bottle.  Medication: Morphine ER (MSContin) Pill/Patch Count: 8 of 60 pills remain Pill/Patch Appearance: Markings consistent with prescribed medication Bottle Appearance: Standard pharmacy container. Clearly labeled. Filled Date: 6 / 28 / 2019 Last Medication intake:  Today   Pharmacokinetics: Liberation and absorption (onset of action): WNL Distribution (time to peak effect): WNL Metabolism and excretion (duration of action): WNL         Pharmacodynamics: Desired effects: Analgesia: Lance Roman reports >50% benefit. Functional ability: Patient reports that medication allows him to accomplish basic ADLs Clinically meaningful improvement in function (CMIF): Sustained CMIF goals met Perceived effectiveness: Described as relatively effective, allowing for increase in activities of daily living (ADL) Undesirable effects: Side-effects or Adverse reactions: None reported Monitoring: [place] PMP: Online review of the past 9-monthperiod conducted. Compliant with practice rules and regulations Last UDS on record: Summary  Date Value Ref Range Status  07/12/2017 FINAL  Final    Comment:    ==================================================================== TOXASSURE SELECT 13 (MW) ==================================================================== Test                             Result       Flag       Units Drug Present and Declared for Prescription Verification   Morphine                        >>32355      EXPECTED   ng/mg creat   Normorphine                    1054         EXPECTED   ng/mg creat    Potential sources of large amounts of morphine in the absence of    codeine include administration of morphine or use of heroin.    Normorphine is an expected metabolite of morphine.   Hydromorphone                  240          EXPECTED   ng/mg creat    Hydromorphone may be present as a metabolite of morphine;    concentrations of hydromorphone rarely exceed 5% of the morphine    concentration when this is the source of hydromorphone. ==================================================================== Test                      Result    Flag   Units      Ref Range   Creatinine              48               mg/dL      >=20 ==================================================================== Declared Medications:  The flagging and interpretation on this report are based on the  following declared medications.  Unexpected results may arise from  inaccuracies in the declared medications.  **Note: The testing scope of this panel includes these medications:  Morphine (Morphine Sulfate)  **Note: The testing scope of this panel does not include following  reported medications:  Celecoxib  Docusate  Esomeprazole  Magnesium  Multivitamin  Olopatadine ==================================================================== For clinical consultation, please call [phone]. ====================================================================    UDS interpretation: Compliant          Medication Assessment Form: Reviewed. Patient indicates being compliant with therapy Treatment compliance: Compliant Risk Assessment Profile: Aberrant behavior: See prior evaluations. None observed or detected today Comorbid factors increasing risk of overdose: See prior notes. No additional risks detected today Risk of substance use disorder (SUD): Low Opioid Risk Tool - 10/18/17 0859      Family  History of Substance Abuse   Alcohol  Negative    Illegal Drugs  Negative    Rx Drugs  Negative      Personal History of Substance Abuse   Alcohol  Negative    Illegal Drugs  Negative    Rx Drugs  Negative      Total Score   Opioid Risk Tool Scoring  0    Opioid Risk Interpretation  Low Risk      ORT Scoring interpretation table:  Score <3 = Low Risk for SUD  Score between 4-7 = Moderate Risk for SUD  Score >8 = High Risk for Opioid Abuse   Risk Mitigation Strategies:  Patient Counseling: Covered Patient-Prescriber Agreement (PPA): Present and active  Notification to other healthcare providers: Done  Pharmacologic Plan: The plan is to continue tapering the opioids down until we can completely discontinue them. Once stopped, we will complete a "Drug Holiday". Once the drug holiday is completed then we will reassess for the need of the opioids.             Laboratory Chemistry  Inflammation Markers (CRP: Acute Phase) (ESR: Chronic Phase) Lab Results  Component Value Date   CRP 2.5 12/12/2016   ESRSEDRATE 10 12/12/2016                         Rheumatology Markers Lab Results  Component Value Date   LABURIC 6.4 08/11/2008                        Renal Function Markers Lab Results  Component Value Date   BUN 20 12/12/2016   CREATININE 0.74 (L) 12/12/2016   BCR 27 (H) 12/12/2016   GFRAA 120 12/12/2016   GFRNONAA 104 12/12/2016                             Hepatic Function Markers Lab Results  Component Value Date   AST 21 12/12/2016   ALT 12 12/30/2014   ALBUMIN 4.5 12/12/2016   ALKPHOS 66 12/12/2016   HCVAB NEGATIVE 12/30/2014                        Electrolytes Lab Results  Component Value Date   NA 140 12/12/2016   K 4.5 12/12/2016   CL 99 12/12/2016   CALCIUM 9.7 12/12/2016   MG 1.9 12/12/2016                        Neuropathy Markers Lab Results  Component Value Date   VITAMINB12 632 12/12/2016  Bone Pathology  Markers Lab Results  Component Value Date   25OHVITD1 44 12/12/2016   25OHVITD2 <1.0 12/12/2016   25OHVITD3 43 12/12/2016                         Coagulation Parameters Lab Results  Component Value Date   PLT 167.0 03/03/2011                        Cardiovascular Markers Lab Results  Component Value Date   HGB 13.1 03/03/2011   HCT 38.5 (L) 03/03/2011                         CA Markers No results found for: CEA, CA125, LABCA2                      Note: Lab results reviewed.  Recent Diagnostic Imaging Results  CT LUMBAR SPINE WO CONTRAST CLINICAL DATA:  Previous lumbar fusion. Recurrent low back pain extending into the lower extremities bilaterally. Left S1 radiculopathy.  EXAM: LUMBAR MYELOGRAM  FLUOROSCOPY TIME:  Radiation Exposure Index (as provided by the fluoroscopic device): 5,456 uGy*m2  Fluoroscopy Time:  48 seconds  Number of Acquired Images:  14  PROCEDURE: After thorough discussion of risks and benefits of the procedure including bleeding, infection, injury to nerves, blood vessels, adjacent structures as well as headache and CSF leak, written and oral informed consent was obtained. Consent was obtained by Dr. San Morelle. Time out form was completed.  Patient was positioned prone on the fluoroscopy table. Local anesthesia was provided with 1% lidocaine without epinephrine after prepped and draped in the usual sterile fashion. Puncture was performed at L1-2 using a 3 1/2 inch 22-gauge spinal needle via right paramedian approach. Using a single pass through the dura, the needle was placed within the thecal sac, with return of clear CSF. 15 mL of Isovue M-200 was injected into the thecal sac, with normal opacification of the nerve roots and cauda equina consistent with free flow within the subarachnoid space.  I personally performed the lumbar puncture and administered the intrathecal contrast. I also personally supervised acquisition  of the myelogram images.  TECHNIQUE: Contiguous axial images were obtained through the Lumbar spine after the intrathecal infusion of infusion. Coronal and sagittal reconstructions were obtained of the axial image sets.  COMPARISON:  None  FINDINGS: LUMBAR MYELOGRAM FINDINGS:  Lumbar fusion is noted at L4-5 and L5-S1. Spinal cord stimulator enters at T12-L1. The stimulator wires are fragmented. Fusion hardware is intact.  Mild subarticular narrowing is present bilaterally at L1-2 and on the right at L2-3.  Lower lumbar nerve roots fill normally on both sides.  Slight retrolisthesis is present at L3-4 and L2-3. This is exaggerated with standing. Increased uncovering of broad-based disc protrusions is present at L2-3 and L3-4 with standing. The retrolisthesis is worse with extension and reduced in flexion. The disc disease with standing and extension is greatest at L3-4.  CT LUMBAR MYELOGRAM FINDINGS:  The lumbar spine is imaged from T11-12 through S1-2.  Schmorl's nodes are present at T11-12 and T12-L1. Vertebral body heights are normal. There is solid fusion anteriorly and posteriorly at L4-5 and L5-S1.  Slight retrolisthesis is present at L3-4. AP alignment is otherwise anatomic.  Limited imaging of the abdomen is unremarkable.  L1-2: Facet hypertrophy is worse on the right. No significant focal disc protrusion or stenosis is present.  L2-3: A mild broad-based disc protrusion is present. Moderate facet hypertrophy is noted bilaterally. Focal stenosis is present on the supine images.  L3-4: A left paramedian central disc protrusion is present. Moderate facet hypertrophy is noted. Mild foraminal narrowing is worse on the right. The central canal is patent.  L4-5: Left laminectomy and facetectomy are noted. Solid fusion is present anteriorly and posteriorly. No residual or recurrent stenosis is present.  L5-S1: Bilateral laminectomies are noted. Solid fusion is  present anteriorly and posteriorly. The nerve roots are distributed peripherally. No residual stenosis is present.  IMPRESSION: 1. Solid fusion at L4-5 and L5-S1 without residual or recurrent stenosis at these levels. 2. Adjacent level disease at L3-4. Central disc protrusion and bilateral facet hypertrophy is noted on the CT scan. Retrolisthesis is exaggerated when the patient stands and extends contributing to dynamic central canal and likely foraminal stenosis. 3. More mild retrolisthesis at L2-3 is also worse with standing and extension. 4. Mild facet hypertrophy at L1-2 without significant stenosis. 5. Spinal cord stimulator is in place. The leads are fragmented as previously noted.  Electronically Signed   By: San Morelle M.D.   On: 08/28/2017 11:12   DG MYELOGRAPHY LUMBAR INJ LUMBOSACRAL CLINICAL DATA:  Previous lumbar fusion. Recurrent low back pain extending into the lower extremities bilaterally. Left S1 radiculopathy.  EXAM: LUMBAR MYELOGRAM  FLUOROSCOPY TIME:  Radiation Exposure Index (as provided by the fluoroscopic device): 7,622 uGy*m2  Fluoroscopy Time:  48 seconds  Number of Acquired Images:  14  PROCEDURE: After thorough discussion of risks and benefits of the procedure including bleeding, infection, injury to nerves, blood vessels, adjacent structures as well as headache and CSF leak, written and oral informed consent was obtained. Consent was obtained by Dr. San Morelle. Time out form was completed.  Patient was positioned prone on the fluoroscopy table. Local anesthesia was provided with 1% lidocaine without epinephrine after prepped and draped in the usual sterile fashion. Puncture was performed at L1-2 using a 3 1/2 inch 22-gauge spinal needle via right paramedian approach. Using a single pass through the dura, the needle was placed within the thecal sac, with return of clear CSF. 15 mL of Isovue M-200 was injected into the  thecal sac, with normal opacification of the nerve roots and cauda equina consistent with free flow within the subarachnoid space.  I personally performed the lumbar puncture and administered the intrathecal contrast. I also personally supervised acquisition of the myelogram images.  TECHNIQUE: Contiguous axial images were obtained through the Lumbar spine after the intrathecal infusion of infusion. Coronal and sagittal reconstructions were obtained of the axial image sets.  COMPARISON:  None  FINDINGS: LUMBAR MYELOGRAM FINDINGS:  Lumbar fusion is noted at L4-5 and L5-S1. Spinal cord stimulator enters at T12-L1. The stimulator wires are fragmented. Fusion hardware is intact.  Mild subarticular narrowing is present bilaterally at L1-2 and on the right at L2-3.  Lower lumbar nerve roots fill normally on both sides.  Slight retrolisthesis is present at L3-4 and L2-3. This is exaggerated with standing. Increased uncovering of broad-based disc protrusions is present at L2-3 and L3-4 with standing. The retrolisthesis is worse with extension and reduced in flexion. The disc disease with standing and extension is greatest at L3-4.  CT LUMBAR MYELOGRAM FINDINGS:  The lumbar spine is imaged from T11-12 through S1-2.  Schmorl's nodes are present at T11-12 and T12-L1. Vertebral body heights are normal. There is solid fusion anteriorly and posteriorly at L4-5 and L5-S1.  Slight  retrolisthesis is present at L3-4. AP alignment is otherwise anatomic.  Limited imaging of the abdomen is unremarkable.  L1-2: Facet hypertrophy is worse on the right. No significant focal disc protrusion or stenosis is present.  L2-3: A mild broad-based disc protrusion is present. Moderate facet hypertrophy is noted bilaterally. Focal stenosis is present on the supine images.  L3-4: A left paramedian central disc protrusion is present. Moderate facet hypertrophy is noted. Mild foraminal narrowing is  worse on the right. The central canal is patent.  L4-5: Left laminectomy and facetectomy are noted. Solid fusion is present anteriorly and posteriorly. No residual or recurrent stenosis is present.  L5-S1: Bilateral laminectomies are noted. Solid fusion is present anteriorly and posteriorly. The nerve roots are distributed peripherally. No residual stenosis is present.  IMPRESSION: 1. Solid fusion at L4-5 and L5-S1 without residual or recurrent stenosis at these levels. 2. Adjacent level disease at L3-4. Central disc protrusion and bilateral facet hypertrophy is noted on the CT scan. Retrolisthesis is exaggerated when the patient stands and extends contributing to dynamic central canal and likely foraminal stenosis. 3. More mild retrolisthesis at L2-3 is also worse with standing and extension. 4. Mild facet hypertrophy at L1-2 without significant stenosis. 5. Spinal cord stimulator is in place. The leads are fragmented as previously noted.  Electronically Signed   By: San Morelle M.D.   On: 08/28/2017 11:12  Complexity Note: Imaging results reviewed. Results shared with Mr. Declercq, using Layman's terms.                         Meds   Current Outpatient Medications:  .  celecoxib (CELEBREX) 200 MG capsule, Take 1 capsule (200 mg total) by mouth 2 (two) times daily., Disp: 180 capsule, Rfl: 1 .  docusate sodium (COLACE) 100 MG capsule, Take 200 mg by mouth daily., Disp: , Rfl:  .  gabapentin (NEURONTIN) 100 MG capsule, Take 1-3 capsules (100-300 mg total) by mouth at bedtime. Follow written titration schedule., Disp: 90 capsule, Rfl: 1 .  ibuprofen (ADVIL,MOTRIN) 200 MG tablet, Take 400 mg by mouth every 6 (six) hours as needed for moderate pain. , Disp: , Rfl:  .  morphine (MS CONTIN) 30 MG 12 hr tablet, Take 1 tablet (30 mg total) by mouth every 12 (twelve) hours., Disp: 60 tablet, Rfl: 0 .  Multiple Vitamin (MULTIVITAMIN) capsule, Take 1 capsule by mouth daily., Disp:  , Rfl:  .  olopatadine (PATANOL) 0.1 % ophthalmic solution, Place 1 drop into both eyes as needed for allergies. , Disp: , Rfl:  .  [START ON 12/03/2017] oxyCODONE (OXY IR/ROXICODONE) 5 MG immediate release tablet, Take 1 tablet (5 mg total) by mouth daily for 7 days. Max: 1/day, Disp: 7 tablet, Rfl: 0 .  [START ON 11/26/2017] oxyCODONE (OXY IR/ROXICODONE) 5 MG immediate release tablet, Take 1 tablet (5 mg total) by mouth 2 (two) times daily for 7 days. Max: 2/day, Disp: 14 tablet, Rfl: 0 .  [START ON 11/19/2017] oxyCODONE (OXY IR/ROXICODONE) 5 MG immediate release tablet, Take 1 tablet (5 mg total) by mouth 3 (three) times daily for 7 days. Max: 3/day, Disp: 21 tablet, Rfl: 0 .  [START ON 11/12/2017] oxyCODONE (OXY IR/ROXICODONE) 5 MG immediate release tablet, Take 1 tablet (5 mg total) by mouth 4 (four) times daily for 7 days. Max: 4/day, Disp: 28 tablet, Rfl: 0 .  [START ON 11/05/2017] oxyCODONE (OXY IR/ROXICODONE) 5 MG immediate release tablet, Take 1 tablet (5 mg  total) by mouth 5 (five) times daily for 7 days. Max: 5/day, Disp: 35 tablet, Rfl: 0 .  [START ON 10/29/2017] oxyCODONE (OXY IR/ROXICODONE) 5 MG immediate release tablet, Take 1 tablet (5 mg total) by mouth 6 (six) times daily for 7 days. Max: 6/day, Disp: 42 tablet, Rfl: 0 .  oxyCODONE (OXY IR/ROXICODONE) 5 MG immediate release tablet, For the next 7 days take 2 tab with breakfast, dinner, and at bedtime. Take only 1 tab at noon. Max: 7/day, Disp: 49 tablet, Rfl: 0 .  esomeprazole (NEXIUM) 40 MG capsule, Take 1 capsule (40 mg total) by mouth 2 (two) times daily before a meal., Disp: 180 capsule, Rfl: 0  ROS  Constitutional: Denies any fever or chills Gastrointestinal: No reported hemesis, hematochezia, vomiting, or acute GI distress Musculoskeletal: Denies any acute onset joint swelling, redness, loss of ROM, or weakness Neurological: No reported episodes of acute onset apraxia, aphasia, dysarthria, agnosia, amnesia, paralysis, loss of  coordination, or loss of consciousness  Allergies  Mr. Harriss has No Known Allergies.  [place]  Drug: Mr. Huckaba  reports that he does not use drugs. Alcohol:  reports that he drinks about 1.8 oz of alcohol per week. Tobacco:  reports that he has quit smoking. His smokeless tobacco use includes chew. Medical:  has a past medical history of Allergic rhinitis, Chronic pain syndrome, DDD (degenerative disc disease), lumbosacral, GERD (gastroesophageal reflux disease), Hyperlipidemia, Osteoarthritis, and S/P appendectomy. Surgical: Mr. Sachs  has a past surgical history that includes Appendectomy; repairs of bilateral ankle fractures; Spinal fusion (N/A); and Spinal cord stimulator implant (N/A). Family: family history includes Diabetes in his maternal grandmother; Heart disease in his father.  Constitutional Exam  General appearance: Well nourished, well developed, and well hydrated. In no apparent acute distress Vitals:   10/18/17 0854  BP: 139/82  Pulse: (!) 59  Temp: 98.4 F (36.9 C)  SpO2: 100%  Weight: 190 lb (86.2 kg)  Height: _0  (1.778 m)   BMI Assessment: Estimated body mass index is 27.26 kg/m as calculated from the following:   Height as of this encounter: _1  (1.778 m).   Weight as of this encounter: 190 lb (86.2 kg).  BMI interpretation table: BMI level Category Range association with higher incidence of chronic pain  <18 kg/m2 Underweight   18.5-24.9 kg/m2 Ideal body weight   25-29.9 kg/m2 Overweight Increased incidence by 20%  30-34.9 kg/m2 Obese (Class I) Increased incidence by 68%  35-39.9 kg/m2 Severe obesity (Class II) Increased incidence by 136%  >40 kg/m2 Extreme obesity (Class III) Increased incidence by 254%   Patient's current BMI Ideal Body weight  Body mass index is 27.26 kg/m. Ideal body weight: 73 kg (160 lb 15 oz) Adjusted ideal body weight: 78.3 kg (172 lb 9 oz)   BMI Readings from Last 4 Encounters:  10/18/17 27.26 kg/m  09/11/17 27.26  kg/m  08/28/17 27.26 kg/m  08/17/17 27.26 kg/m   Wt Readings from Last 4 Encounters:  10/18/17 190 lb (86.2 kg)  09/11/17 190 lb (86.2 kg)  08/28/17 190 lb (86.2 kg)  08/17/17 190 lb (86.2 kg)  Psych/Mental status: Alert, oriented x 3 (person, place, & time)       Eyes: PERLA Respiratory: No evidence of acute respiratory distress  Cervical Spine Area Exam  Skin & Axial Inspection: No masses, redness, edema, swelling, or associated skin lesions Alignment: Symmetrical Functional ROM: Unrestricted ROM      Stability: No instability detected Muscle Tone/Strength: Functionally intact. No obvious neuro-muscular  anomalies detected. Sensory (Neurological): Unimpaired Palpation: No palpable anomalies              Upper Extremity (UE) Exam    Side: Right upper extremity  Side: Left upper extremity  Skin & Extremity Inspection: Skin color, temperature, and hair growth are WNL. No peripheral edema or cyanosis. No masses, redness, swelling, asymmetry, or associated skin lesions. No contractures.  Skin & Extremity Inspection: Skin color, temperature, and hair growth are WNL. No peripheral edema or cyanosis. No masses, redness, swelling, asymmetry, or associated skin lesions. No contractures.  Functional ROM: Unrestricted ROM          Functional ROM: Unrestricted ROM          Muscle Tone/Strength: Functionally intact. No obvious neuro-muscular anomalies detected.  Muscle Tone/Strength: Functionally intact. No obvious neuro-muscular anomalies detected.  Sensory (Neurological): Unimpaired          Sensory (Neurological): Unimpaired          Palpation: No palpable anomalies              Palpation: No palpable anomalies              Provocative Test(s):  Phalen's test: deferred Tinel's test: deferred Apley's scratch test (touch opposite shoulder):  Action 1 (Across chest): deferred Action 2 (Overhead): deferred Action 3 (LB reach): deferred   Provocative Test(s):  Phalen's test:  deferred Tinel's test: deferred Apley's scratch test (touch opposite shoulder):  Action 1 (Across chest): deferred Action 2 (Overhead): deferred Action 3 (LB reach): deferred    Thoracic Spine Area Exam  Skin & Axial Inspection: No masses, redness, or swelling Alignment: Symmetrical Functional ROM: Unrestricted ROM Stability: No instability detected Muscle Tone/Strength: Functionally intact. No obvious neuro-muscular anomalies detected. Sensory (Neurological): Unimpaired Muscle strength & Tone: No palpable anomalies  Lumbar Spine Area Exam  Skin & Axial Inspection: No masses, redness, or swelling Alignment: Symmetrical Functional ROM: Decreased ROM       Stability: No instability detected Muscle Tone/Strength: Increased muscle tone over affected area Sensory (Neurological): Movement-associated pain Palpation: Complains of area being tender to palpation       Provocative Tests: Lumbar Hyperextension/rotation test: (+) bilaterally for facet joint pain. Lumbar quadrant test (Kemp's test): (+) bilaterally for facet joint pain. Lumbar Lateral bending test: deferred today       Patrick's Maneuver: deferred today                   FABER test: deferred today                   Thigh-thrust test: deferred today       S-I compression test: deferred today       S-I distraction test: deferred today        Gait & Posture Assessment  Ambulation: Unassisted Gait: Relatively normal for age and body habitus Posture: Antalgic   Lower Extremity Exam    Side: Right lower extremity  Side: Left lower extremity  Stability: No instability observed          Stability: No instability observed          Skin & Extremity Inspection: Skin color, temperature, and hair growth are WNL. No peripheral edema or cyanosis. No masses, redness, swelling, asymmetry, or associated skin lesions. No contractures.  Skin & Extremity Inspection: Skin color, temperature, and hair growth are WNL. No peripheral edema or  cyanosis. No masses, redness, swelling, asymmetry, or associated skin lesions. No contractures.  Functional ROM:  Unrestricted ROM                  Functional ROM: Unrestricted ROM                  Muscle Tone/Strength: Functionally intact. No obvious neuro-muscular anomalies detected.  Muscle Tone/Strength: Functionally intact. No obvious neuro-muscular anomalies detected.  Sensory (Neurological): Unimpaired  Sensory (Neurological): Unimpaired  Palpation: No palpable anomalies  Palpation: No palpable anomalies   Assessment  Primary Diagnosis & Pertinent Problem List: The primary encounter diagnosis was Chronic low back pain (Primary Area of Pain) (Bilateral) (L>R). Diagnoses of Spondylosis without myelopathy or radiculopathy, lumbosacral region, Lumbar facet syndrome (Bilateral) (L>R), Lumbar facet osteoarthritis (Bilateral), Lumbar facet hypertrophy (Bilateral), Chronic pain syndrome, Pharmacologic therapy, Long-term current use of opiate analgesic, and Long term prescription opiate use were also pertinent to this visit.  Status Diagnosis  Persistent Stable Persistent 1. Chronic low back pain (Primary Area of Pain) (Bilateral) (L>R)   2. Spondylosis without myelopathy or radiculopathy, lumbosacral region   3. Lumbar facet syndrome (Bilateral) (L>R)   4. Lumbar facet osteoarthritis (Bilateral)   5. Lumbar facet hypertrophy (Bilateral)   6. Chronic pain syndrome   7. Pharmacologic therapy   8. Long-term current use of opiate analgesic   9. Long term prescription opiate use     Problems updated and reviewed during this visit: No problems updated. Plan of Care  Pharmacotherapy (Medications Ordered): Meds ordered this encounter  Medications  . oxyCODONE (OXY IR/ROXICODONE) 5 MG immediate release tablet    Sig: Take 1 tablet (5 mg total) by mouth daily for 7 days. Max: 1/day    Dispense:  7 tablet    Refill:  0    This prescription is part of a downward opioid taper. Fill prescriptions  in the correct order. Fill instructions must be followed exactly as written to avoid withdrawal. Fill date: 12/03/17 To last until: 12/10/17  . oxyCODONE (OXY IR/ROXICODONE) 5 MG immediate release tablet    Sig: Take 1 tablet (5 mg total) by mouth 2 (two) times daily for 7 days. Max: 2/day    Dispense:  14 tablet    Refill:  0    This prescription is part of a downward opioid taper. Fill prescriptions in the correct order. Fill instructions must be followed exactly as written to avoid withdrawal. Fill date: 11/26/17 To last until: 12/03/17  . oxyCODONE (OXY IR/ROXICODONE) 5 MG immediate release tablet    Sig: Take 1 tablet (5 mg total) by mouth 3 (three) times daily for 7 days. Max: 3/day    Dispense:  21 tablet    Refill:  0    This prescription is part of a downward opioid taper. Fill prescriptions in the correct order. Fill instructions must be followed exactly as written to avoid withdrawal. Fill date: 11/19/17 To last until: 11/26/17  . oxyCODONE (OXY IR/ROXICODONE) 5 MG immediate release tablet    Sig: Take 1 tablet (5 mg total) by mouth 4 (four) times daily for 7 days. Max: 4/day    Dispense:  28 tablet    Refill:  0    This prescription is part of a downward opioid taper. Fill prescriptions in the correct order. Fill instructions must be followed exactly as written to avoid withdrawal. Fill date: 11/12/17 To last until: 11/19/17  . oxyCODONE (OXY IR/ROXICODONE) 5 MG immediate release tablet    Sig: Take 1 tablet (5 mg total) by mouth 5 (five) times daily for 7 days. Max:  5/day    Dispense:  35 tablet    Refill:  0    This prescription is part of a downward opioid taper. Fill prescriptions in the correct order. Fill instructions must be followed exactly as written to avoid withdrawal. Fill date: 11/05/17 To last until: 11/12/17  . oxyCODONE (OXY IR/ROXICODONE) 5 MG immediate release tablet    Sig: Take 1 tablet (5 mg total) by mouth 6 (six) times daily for 7 days. Max: 6/day     Dispense:  42 tablet    Refill:  0    This prescription is part of a downward opioid taper. Fill prescriptions in the correct order. Fill instructions must be followed exactly as written to avoid withdrawal. Fill date: 10/29/17 To last until: 11/05/17  . oxyCODONE (OXY IR/ROXICODONE) 5 MG immediate release tablet    Sig: For the next 7 days take 2 tab with breakfast, dinner, and at bedtime. Take only 1 tab at noon. Max: 7/day    Dispense:  49 tablet    Refill:  0    This prescription is part of a downward opioid taper. Fill prescriptions in the correct order. Fill instructions must be followed exactly as written to avoid withdrawal. Fill date: 10/22/17 To last until: 10/29/17   Medications administered today: Keynan Heffern. Mohar had no medications administered during this visit.   Procedure Orders     LUMBAR FACET(MEDIAL BRANCH NERVE BLOCK) [facility] Lab Orders  No laboratory test(s) ordered today   Imaging Orders  No imaging studies ordered today   Referral Orders  No referral(s) requested today   Interventional management options: Planned, scheduled, and/or pending:   Currently undergoing opioid downward tapering. Started at 150 MME and is currently down to 60 MME. The goal is to do a "Drug Holiday" and then go back to no more than 60 MME, if still needed. Therapeutic bilateral lumbar facet RFA #1 (starting with the left side) (schedule for August) Palliative bilateral lumbar facet block under fluoroscopic guidance and IV sedation for flareups of the pain until we get to the RFA.  Therapeutic Revision and replacement of spinal cord stimulator with MRI compatible device.   Considering:   Removal of nonfunctional spinal cord stimulatorfollowed by an MRI of the lumbar spine with and without contrast. Diagnostic bilateral lumbar facet block Possible bilateral lumbar facet RFA Diagnostic caudal epidural steroid injection plus diagnostic epidurogram Possible Racz procedure    Palliative PRN treatment(s):   Palliative bilateral lumbar facet block under fluoro and IV sedation    Provider-requested follow-up: Return for RFA (fluoro + sedation): (L) L-FCT RFA. (Monday 01/01/18)  Future Appointments  Date Time Provider [place]  11/21/2017 10:30 AM Milinda Pointer, MD [facility] None   Primary Care Physician: Hoyt Koch, MD Location: [facility] Note by: Gaspar Cola, MD Date: 10/18/2017; Time: 10:45 AM

## 2017-10-18 ENCOUNTER — Encounter: Payer: Self-pay | Admitting: Pain Medicine

## 2017-10-18 ENCOUNTER — Other Ambulatory Visit: Payer: Self-pay

## 2017-10-18 ENCOUNTER — Ambulatory Visit: Payer: No Typology Code available for payment source | Attending: Pain Medicine | Admitting: Pain Medicine

## 2017-10-18 VITALS — BP 139/82 | HR 59 | Temp 98.4°F | Ht 70.0 in | Wt 190.0 lb

## 2017-10-18 DIAGNOSIS — G894 Chronic pain syndrome: Secondary | ICD-10-CM

## 2017-10-18 DIAGNOSIS — M545 Low back pain: Secondary | ICD-10-CM | POA: Diagnosis present

## 2017-10-18 DIAGNOSIS — Z79899 Other long term (current) drug therapy: Secondary | ICD-10-CM | POA: Diagnosis not present

## 2017-10-18 DIAGNOSIS — K219 Gastro-esophageal reflux disease without esophagitis: Secondary | ICD-10-CM | POA: Diagnosis not present

## 2017-10-18 DIAGNOSIS — Z79891 Long term (current) use of opiate analgesic: Secondary | ICD-10-CM | POA: Diagnosis not present

## 2017-10-18 DIAGNOSIS — M47816 Spondylosis without myelopathy or radiculopathy, lumbar region: Secondary | ICD-10-CM

## 2017-10-18 DIAGNOSIS — Z981 Arthrodesis status: Secondary | ICD-10-CM | POA: Diagnosis not present

## 2017-10-18 DIAGNOSIS — M5126 Other intervertebral disc displacement, lumbar region: Secondary | ICD-10-CM | POA: Diagnosis not present

## 2017-10-18 DIAGNOSIS — L723 Sebaceous cyst: Secondary | ICD-10-CM | POA: Diagnosis not present

## 2017-10-18 DIAGNOSIS — M47817 Spondylosis without myelopathy or radiculopathy, lumbosacral region: Secondary | ICD-10-CM

## 2017-10-18 DIAGNOSIS — M4316 Spondylolisthesis, lumbar region: Secondary | ICD-10-CM | POA: Insufficient documentation

## 2017-10-18 DIAGNOSIS — G8929 Other chronic pain: Secondary | ICD-10-CM

## 2017-10-18 DIAGNOSIS — M5442 Lumbago with sciatica, left side: Secondary | ICD-10-CM

## 2017-10-18 DIAGNOSIS — M5136 Other intervertebral disc degeneration, lumbar region: Secondary | ICD-10-CM | POA: Diagnosis not present

## 2017-10-18 MED ORDER — OXYCODONE HCL 5 MG PO TABS: 5.0000 mg | ORAL_TABLET | Freq: Four times a day (QID) | ORAL | 0 refills | Status: DC

## 2017-10-18 MED ORDER — OXYCODONE HCL 5 MG PO TABS: 5.0000 mg | ORAL_TABLET | Freq: Every day | ORAL | 0 refills | Status: DC

## 2017-10-18 MED ORDER — OXYCODONE HCL 5 MG PO TABS: 5.0000 mg | ORAL_TABLET | Freq: Two times a day (BID) | ORAL | 0 refills | Status: DC

## 2017-10-18 MED ORDER — OXYCODONE HCL 5 MG PO TABS: ORAL_TABLET | ORAL | 0 refills | Status: DC

## 2017-10-18 MED ORDER — OXYCODONE HCL 5 MG PO TABS
5.0000 mg | ORAL_TABLET | Freq: Three times a day (TID) | ORAL | 0 refills | Status: DC
Start: 2017-11-19 — End: 2018-01-23

## 2017-10-18 NOTE — Patient Instructions (Addendum)
______You have been given 7 prescriptions which are numbered today for Oxycodone 5 mg. I have given you pre procedure instructions. ______________________________________________________________________________________  Preparing for Procedure with Sedation  Instructions: . Oral Intake: Do not eat or drink anything for at least 8 hours prior to your procedure. . Transportation: Public transportation is not allowed. Bring an adult driver. The driver must be physically present in our waiting room before any procedure can be started. Marland Kitchen Physical Assistance: Bring an adult physically capable of assisting you, in the event you need help. This adult should keep you company at home for at least 6 hours after the procedure. . Blood Pressure Medicine: Take your blood pressure medicine with a sip of water the morning of the procedure. . Blood thinners: Notify our staff if you are taking any blood thinners. Depending on which one you take, there will be specific instructions on how and when to stop it. . Diabetics on insulin: Notify the staff so that you can be scheduled 1st case in the morning. If your diabetes requires high dose insulin, take only  of your normal insulin dose the morning of the procedure and notify the staff that you have done so. . Preventing infections: Shower with an antibacterial soap the morning of your procedure. . Build-up your immune system: Take 1000 mg of Vitamin C with every meal (3 times a day) the day prior to your procedure. Marland Kitchen Antibiotics: Inform the staff if you have a condition or reason that requires you to take antibiotics before dental procedures. . Pregnancy: If you are pregnant, call and cancel the procedure. . Sickness: If you have a cold, fever, or any active infections, call and cancel the procedure. . Arrival: You must be in the facility at least 30 minutes prior to your scheduled procedure. . Children: Do not bring children with you. . Dress appropriately: Bring  dark clothing that you would not mind if they get stained. . Valuables: Do not bring any jewelry or valuables.  Procedure appointments are reserved for interventional treatments only. Marland Kitchen No Prescription Refills. . No medication changes will be discussed during procedure appointments. . No disability issues will be discussed.  Reasons to call and reschedule or cancel your procedure: (Following these recommendations will minimize the risk of a serious complication.) . Surgeries: Avoid having procedures within 2 weeks of any surgery. (Avoid for 2 weeks before or after any surgery). . Flu Shots: Avoid having procedures within 2 weeks of a flu shots or . (Avoid for 2 weeks before or after immunizations). . Barium: Avoid having a procedure within 7-10 days after having had a radiological study involving the use of radiological contrast. (Myelograms, Barium swallow or enema study). . Heart attacks: Avoid any elective procedures or surgeries for the initial 6 months after a "Myocardial Infarction" (Heart Attack). . Blood thinners: It is imperative that you stop these medications before procedures. Let us know if you if you take any blood thinner.  . Infection: Avoid procedures during or within two weeks of an infection (including chest colds or gastrointestinal problems). Symptoms associated with infections include: Localized redness, fever, chills, night sweats or profuse sweating, burning sensation when voiding, cough, congestion, stuffiness, runny nose, sore throat, diarrhea, nausea, vomiting, cold or Flu symptoms, recent or current infections. It is specially important if the infection is over the area that we intend to treat. Marland Kitchen Heart and lung problems: Symptoms that may suggest an active cardiopulmonary problem include: cough, chest pain, breathing difficulties or shortness of breath,  dizziness, ankle swelling, uncontrolled high or unusually low blood pressure, and/or palpitations. If you are experiencing  any of these symptoms, cancel your procedure and contact your primary care physician for an evaluation.  Remember:  Regular Business hours are:  Monday to Thursday 8:00 AM to 4:00 PM  Provider's Schedule: Delano MetzFrancisco Madelon Welsch, MD:  Procedure days: Tuesday and Thursday 7:30 AM to 4:00 PM  Edward JollyBilal Lateef, MD:  Procedure days: Monday and Wednesday 7:30 AM to 4:00 PM ____________________________________________________________________________________________

## 2017-10-18 NOTE — Progress Notes (Signed)
Nursing Pain Medication Assessment:  Safety precautions to be maintained throughout the outpatient stay will include: orient to surroundings, keep bed in low position, maintain call bell within reach at all times, provide assistance with transfer out of bed and ambulation.  Medication Inspection Compliance: Pill count conducted under aseptic conditions, in front of the patient. Neither the pills nor the bottle was removed from the patient's sight at any time. Once count was completed pills were immediately returned to the patient in their original bottle.  Medication: Morphine ER (MSContin) Pill/Patch Count: 8 of 60 pills remain Pill/Patch Appearance: Markings consistent with prescribed medication Bottle Appearance: Standard pharmacy container. Clearly labeled. Filled Date: 6 / 28 / 2019 Last Medication intake:  Today

## 2017-11-02 ENCOUNTER — Telehealth: Payer: Self-pay | Admitting: *Deleted

## 2017-11-02 NOTE — Telephone Encounter (Signed)
Left voicemail with patient that I have reviewed his chart and the plan is a taper to then complete Drug Holiday and to reassess after that time. Asked to call me if he needs additional information or has questions.

## 2017-11-21 ENCOUNTER — Ambulatory Visit
Admission: RE | Admit: 2017-11-21 | Discharge: 2017-11-21 | Disposition: A | Discharge status: Home / Self Care | Payer: No Typology Code available for payment source | Source: Ambulatory Visit | Attending: Pain Medicine | Admitting: Pain Medicine

## 2017-11-21 ENCOUNTER — Ambulatory Visit (HOSPITAL_BASED_OUTPATIENT_CLINIC_OR_DEPARTMENT_OTHER): Payer: No Typology Code available for payment source | Admitting: Pain Medicine

## 2017-11-21 ENCOUNTER — Other Ambulatory Visit: Payer: Self-pay

## 2017-11-21 ENCOUNTER — Encounter: Payer: Self-pay | Admitting: Pain Medicine

## 2017-11-21 VITALS — BP 122/78 | HR 60 | Temp 98.3°F | Resp 18 | Ht 70.0 in | Wt 190.0 lb

## 2017-11-21 DIAGNOSIS — M47816 Spondylosis without myelopathy or radiculopathy, lumbar region: Secondary | ICD-10-CM | POA: Insufficient documentation

## 2017-11-21 DIAGNOSIS — G8918 Other acute postprocedural pain: Secondary | ICD-10-CM

## 2017-11-21 DIAGNOSIS — M47817 Spondylosis without myelopathy or radiculopathy, lumbosacral region: Secondary | ICD-10-CM | POA: Diagnosis not present

## 2017-11-21 DIAGNOSIS — M792 Neuralgia and neuritis, unspecified: Secondary | ICD-10-CM

## 2017-11-21 DIAGNOSIS — M47896 Other spondylosis, lumbar region: Secondary | ICD-10-CM | POA: Diagnosis not present

## 2017-11-21 DIAGNOSIS — K219 Gastro-esophageal reflux disease without esophagitis: Secondary | ICD-10-CM | POA: Insufficient documentation

## 2017-11-21 DIAGNOSIS — G8929 Other chronic pain: Secondary | ICD-10-CM | POA: Insufficient documentation

## 2017-11-21 DIAGNOSIS — M545 Low back pain: Secondary | ICD-10-CM | POA: Diagnosis present

## 2017-11-21 DIAGNOSIS — M5442 Lumbago with sciatica, left side: Secondary | ICD-10-CM

## 2017-11-21 HISTORY — DX: Other acute postprocedural pain: G89.18

## 2017-11-21 MED ORDER — FENTANYL CITRATE (PF) 100 MCG/2ML IJ SOLN
25.0000 ug | INTRAMUSCULAR | Status: DC | PRN
  Administered 2017-11-21: 100 ug via INTRAVENOUS
  Filled 2017-11-21: qty 2

## 2017-11-21 MED ORDER — LACTATED RINGERS IV SOLN
1000.0000 mL | Freq: Once | INTRAVENOUS | Status: AC
  Administered 2017-11-21: 1000 mL via INTRAVENOUS

## 2017-11-21 MED ORDER — GABAPENTIN 100 MG PO CAPS: 100.0000 mg | ORAL_CAPSULE | Freq: Every day | ORAL | 1 refills | Status: DC

## 2017-11-21 MED ORDER — ESOMEPRAZOLE MAGNESIUM 40 MG PO CPDR: 40.0000 mg | DELAYED_RELEASE_CAPSULE | Freq: Two times a day (BID) | ORAL | 0 refills | Status: DC

## 2017-11-21 MED ORDER — MIDAZOLAM HCL 5 MG/5ML IJ SOLN
1.0000 mg | INTRAMUSCULAR | Status: DC | PRN
  Administered 2017-11-21: 4 mg via INTRAVENOUS
  Filled 2017-11-21: qty 5

## 2017-11-21 MED ORDER — LIDOCAINE HCL 2 % IJ SOLN
20.0000 mL | Freq: Once | INTRAMUSCULAR | Status: AC
  Administered 2017-11-21: 400 mg
  Filled 2017-11-21: qty 20

## 2017-11-21 MED ORDER — ROPIVACAINE HCL 2 MG/ML IJ SOLN
9.0000 mL | Freq: Once | INTRAMUSCULAR | Status: AC
  Administered 2017-11-21: 10 mL via PERINEURAL
  Filled 2017-11-21: qty 10

## 2017-11-21 MED ORDER — HYDROCODONE-ACETAMINOPHEN 5-325 MG PO TABS: 1.0000 | ORAL_TABLET | Freq: Four times a day (QID) | ORAL | 0 refills | Status: AC | PRN

## 2017-11-21 MED ORDER — TRIAMCINOLONE ACETONIDE 40 MG/ML IJ SUSP
40.0000 mg | Freq: Once | INTRAMUSCULAR | Status: AC
  Administered 2017-11-21: 40 mg
  Filled 2017-11-21: qty 1

## 2017-11-21 NOTE — Patient Instructions (Addendum)
___________________________________________________________________________________________  Post-Radiofrequency (RF) Discharge Instructions  You have just completed a Radiofrequency Neurotomy.  The following instructions will provide you with information and guidelines for self-care upon discharge.  If at any time you have questions or concerns please call your physician. DO NOT DRIVE YOURSELF!!  Instructions:  Apply ice: Fill a plastic sandwich bag with crushed ice. Cover it with a small towel and apply to injection site. Apply for 15 minutes then remove x 15 minutes. Repeat sequence on day of procedure, until you go to bed. The purpose is to minimize swelling and discomfort after procedure.  Apply heat: Apply heat to procedure site starting the day following the procedure. The purpose is to treat any soreness and discomfort from the procedure.  Food intake: No eating limitations, unless stipulated above.  Nevertheless, if you have had sedation, you may experience some nausea.  In this case, it may be wise to wait at least two hours prior to resuming regular diet.  Physical activities: Keep activities to a minimum for the first 8 hours after the procedure. For the first 24 hours after the procedure, do not drive a motor vehicle,  Operate heavy machinery, power tools, or handle any weapons.  Consider walking with the use of an assistive device or accompanied by an adult for the first 24 hours.  Do not drink alcoholic beverages including beer.  Do not make any important decisions or sign any legal documents. Go home and rest today.  Resume activities tomorrow, as tolerated.  Use caution in moving about as you may experience mild leg weakness.  Use caution in cooking, use of household electrical appliances and climbing steps.  Driving: If you have received any sedation, you are not allowed to drive for 24 hours after your procedure.  Blood thinner: Restart your blood thinner 6 hours after your  procedure. (Only for those taking blood thinners)  Insulin: As soon as you can eat, you may resume your normal dosing schedule. (Only for those taking insulin)  Medications: May resume pre-procedure medications.  Do not take any drugs, other than what has been prescribed to you.  Infection prevention: Keep procedure site clean and dry.  Post-procedure Pain Diary: Extremely important that this be done correctly and accurately. Recorded information will be used to determine the next step in treatment.  Pain evaluated is that of treated area only. Do not include pain from an untreated area.  Complete every hour, on the hour, for the initial 8 hours. Set an alarm to help you do this part accurately.  Do not go to sleep and have it completed later. It will not be accurate.  Follow-up appointment: Keep your follow-up appointment after the procedure. Usually 2 weeks for most procedures. (6 weeks in the case of radiofrequency.) Bring you pain diary.   Expect:  From numbing medicine (AKA: Local Anesthetics): Numbness or decrease in pain.  Onset: Full effect within 15 minutes of injected.  Duration: It will depend on the type of local anesthetic used. On the average, 1 to 8 hours.   From steroids: Decrease in swelling or inflammation. Once inflammation is improved, relief of the pain will follow.  Onset of benefits: Depends on the amount of swelling present. The more swelling, the longer it will take for the benefits to be seen. In some cases, up to 10 days.  Duration: Steroids will stay in the system x 2 weeks. Duration of benefits will depend on multiple posibilities including persistent irritating factors.  From procedure: Some   discomfort is to be expected once the numbing medicine wears off. This should be minimal if ice and heat are applied as instructed.  Call if:  You experience numbness and weakness that gets worse with time, as opposed to wearing off.  He experience any unusual  bleeding, difficulty breathing, or loss of the ability to control your bowel and bladder. (This applies to Spinal procedures only)  You experience any redness, swelling, heat, red streaks, elevated temperature, fever, or any other signs of a possible infection.  Emergency Numbers:  Durning business hours (Monday - Thursday, 8:00 AM - 4:00 PM) (Friday, 9:00 AM - 12:00 Noon): [phone]  After hours: [phone] ____________________________________________________________________________________________  Pain Management Discharge Instructions  General Discharge Instructions :  If you need to reach your doctor call: Monday-Friday 8:00 am - 4:00 pm at [phone] or toll free [phone].  After clinic hours [phone] to have operator reach doctor.  Bring all of your medication bottles to all your appointments in the pain clinic.  To cancel or reschedule your appointment with Pain Management please remember to call 24 hours in advance to avoid a fee.  Refer to the educational materials which you have been given on: General Risks, I had my Procedure. Discharge Instructions, Post Sedation.  Post Procedure Instructions:  The drugs you were given will stay in your system until tomorrow, so for the next 24 hours you should not drive, make any legal decisions or drink any alcoholic beverages.  You may eat anything you prefer, but it is better to start with liquids then soups and crackers, and gradually work up to solid foods.  Please notify your doctor immediately if you have any unusual bleeding, trouble breathing or pain that is not related to your normal pain.  Depending on the type of procedure that was done, some parts of your body may feel week and/or numb.  This usually clears up by tonight or the next day.  Walk with the use of an assistive device or accompanied by an adult for the 24 hours.  You may use ice on the affected area for the first 24 hours.  Put ice in a  Ziploc bag and cover with a towel and place against area 15 minutes on 15 minutes off.  You may switch to heat after 24 hours.Radiofrequency Lesioning Radiofrequency lesioning is a procedure that is performed to relieve pain. The procedure is often used for back, neck, or arm pain. Radiofrequency lesioning involves the use of a machine that creates radio waves to make heat. During the procedure, the heat is applied to the nerve that carries the pain signal. The heat damages the nerve and interferes with the pain signal. Pain relief usually starts about 2 weeks after the procedure and lasts for 6 months to 1 year. Tell a health care provider about:  Any allergies you have.  All medicines you are taking, including vitamins, herbs, eye drops, creams, and over-the-counter medicines.  Any problems you or family members have had with anesthetic medicines.  Any blood disorders you have.  Any surgeries you have had.  Any medical conditions you have.  Whether you are pregnant or may be pregnant. What are the risks? Generally, this is a safe procedure. However, problems may occur, including:  Pain or soreness at the injection site.  Infection at the injection site.  Damage to nerves or blood vessels.  What happens before the procedure?  Ask your health care provider about: ? Changing or stopping your regular   medicines. This is especially important if you are taking diabetes medicines or blood thinners. ? Taking medicines such as aspirin and ibuprofen. These medicines can thin your blood. Do not take these medicines before your procedure if your health care provider instructs you not to.  Follow instructions from your health care provider about eating or drinking restrictions.  Plan to have someone take you home after the procedure.  If you go home right after the procedure, plan to have someone with you for 24 hours. What happens during the procedure?  You will be given one or more of  the following: ? A medicine to help you relax (sedative). ? A medicine to numb the area (local anesthetic).  You will be awake during the procedure. You will need to be able to talk with the health care provider during the procedure.  With the help of a type of X-ray (fluoroscopy), the health care provider will insert a radiofrequency needle into the area to be treated.  Next, a wire that carries the radio waves (electrode) will be put through the radiofrequency needle. An electrical pulse will be sent through the electrode to verify the correct nerve. You will feel a tingling sensation, and you may have muscle twitching.  Then, the tissue that is around the needle tip will be heated by an electric current that is passed using the radiofrequency machine. This will numb the nerves.  A bandage (dressing) will be put on the insertion area after the procedure is done. The procedure may vary among health care providers and hospitals. What happens after the procedure?  Your blood pressure, heart rate, breathing rate, and blood oxygen level will be monitored often until the medicines you were given have worn off.  Return to your normal activities as directed by your health care provider. This information is not intended to replace advice given to you by your health care provider. Make sure you discuss any questions you have with your health care provider. Document Released: 11/10/2010 Document Revised: 08/20/2015 Document Reviewed: 04/21/2014 [employer] Interactive Patient Education  2018 [employer]  

## 2017-11-21 NOTE — Progress Notes (Signed)
Safety precautions to be maintained throughout the outpatient stay will include: orient to surroundings, keep bed in low position, maintain call bell within reach at all times, provide assistance with transfer out of bed and ambulation.  

## 2017-11-21 NOTE — Progress Notes (Signed)
Patient's Name: REVAN Roman  MRN: [id number]  Referring Provider: Delano Metz, MD  DOB: 08-16-1960  PCP: Myrlene Broker, MD  DOS: 11/21/2017  Note by: Oswaldo Done, MD  Service setting: Ambulatory outpatient  Specialty: Interventional Pain Management  Patient type: Established  Location: [facility] (AMB) Pain Management Facility  Visit type: Interventional Procedure   Primary Reason for Visit: Interventional Pain Management Treatment. CC: Back Pain (lower)  Procedure:          Anesthesia, Analgesia, Anxiolysis:  Type: Thermal Lumbar Facet, Medial Branch Radiofrequency Ablation/Neurotomy #1 Level: L2, L3, L4, L5, & S1 Medial Branch Level(s). These levels will denervate the L3-4, L4-5, and the L5-S1 lumbar facet joints. Primary Purpose: Therapeutic Region: Posterolateral Lumbosacral Spine Laterality: Left  Type: Moderate (Conscious) Sedation combined with Local Anesthesia Indication(s): Analgesia and Anxiety Route: Intravenous (IV) IV Access: Secured Sedation: Meaningful verbal contact was maintained at all times during the procedure  Local Anesthetic: Lidocaine 1-2%   Indications: 1. Spondylosis without myelopathy or radiculopathy, lumbosacral region   2. Lumbar facet osteoarthritis (Bilateral)   3. Lumbar facet hypertrophy (Bilateral)   4. Lumbar facet syndrome (Bilateral) (L>R)   5. Chronic low back pain (Primary Area of Pain) (Bilateral) (L>R)   6. Neurogenic pain   7. Gastroesophageal reflux disease without esophagitis   8. Acute postoperative pain    Lance Roman has been dealing with the above chronic pain for longer than three months and has either failed to respond, was unable to tolerate, or simply did not get enough benefit from other more conservative therapies including, but not limited to: 1. Over-the-counter medications 2. Anti-inflammatory medications 3. Muscle relaxants 4. Membrane stabilizers 5. Opioids 6. Physical therapy 7. Modalities (Heat, ice,  etc.) 8. Invasive techniques such as nerve blocks. Lance Roman has attained more than 50% relief of the pain from a series of diagnostic injections conducted in separate occasions.  Pain Score: Pre-procedure: 5 /10 Post-procedure: 0-No pain/10  Pre-op Assessment:  Lance Roman is a 57 y.o. (year old), male patient, seen today for interventional treatment. He  has a past surgical history that includes Appendectomy; repairs of bilateral ankle fractures; Spinal fusion (N/A); and Spinal cord stimulator implant (N/A). Lance Roman has a current medication list which includes the following prescription(s): celecoxib, docusate sodium, ibuprofen, multivitamin, olopatadine, oxycodone, oxycodone, oxycodone, esomeprazole, gabapentin, hydrocodone-acetaminophen, and hydrocodone-acetaminophen, and the following Facility-Administered Medications: fentanyl, lactated ringers, and midazolam. His primarily concern today is the Back Pain (lower)  Initial Vital Signs:  Pulse/HCG Rate: 60ECG Heart Rate: (!) 56 Temp: 98.3 F (36.8 C) Resp: 18 BP: 135/70 SpO2: 100 %  BMI: Estimated body mass index is 27.26 kg/m as calculated from the following:   Height as of this encounter: 5\' 10"  (1.778 m).   Weight as of this encounter: 190 lb (86.2 kg).  Risk Assessment: Allergies: Reviewed. He has No Known Allergies.  Allergy Precautions: None required Coagulopathies: Reviewed. None identified.  Blood-thinner therapy: None at this time Active Infection(s): Reviewed. None identified. Lance Roman is afebrile  Site Confirmation: Lance Roman was asked to confirm the procedure and laterality before marking the site Procedure checklist: Completed Consent: Before the procedure and under the influence of no sedative(s), amnesic(s), or anxiolytics, the patient was informed of the treatment options, risks and possible complications. To fulfill our ethical and legal obligations, as recommended by the American Medical Association's Code  of Ethics, I have informed the patient of my clinical impression; the nature and purpose of the treatment or procedure;  the risks, benefits, and possible complications of the intervention; the alternatives, including doing nothing; the risk(s) and benefit(s) of the alternative treatment(s) or procedure(s); and the risk(s) and benefit(s) of doing nothing. The patient was provided information about the general risks and possible complications associated with the procedure. These may include, but are not limited to: failure to achieve desired goals, infection, bleeding, organ or nerve damage, allergic reactions, paralysis, and death. In addition, the patient was informed of those risks and complications associated to Spine-related procedures, such as failure to decrease pain; infection (i.e.: Meningitis, epidural or intraspinal abscess); bleeding (i.e.: epidural hematoma, subarachnoid hemorrhage, or any other type of intraspinal or peri-dural bleeding); organ or nerve damage (i.e.: Any type of peripheral nerve, nerve root, or spinal cord injury) with subsequent damage to sensory, motor, and/or autonomic systems, resulting in permanent pain, numbness, and/or weakness of one or several areas of the body; allergic reactions; (i.e.: anaphylactic reaction); and/or death. Furthermore, the patient was informed of those risks and complications associated with the medications. These include, but are not limited to: allergic reactions (i.e.: anaphylactic or anaphylactoid reaction(s)); adrenal axis suppression; blood sugar elevation that in diabetics may result in ketoacidosis or comma; water retention that in patients with history of congestive heart failure may result in shortness of breath, pulmonary edema, and decompensation with resultant heart failure; weight gain; swelling or edema; medication-induced neural toxicity; particulate matter embolism and blood vessel occlusion with resultant organ, and/or nervous system  infarction; and/or aseptic necrosis of one or more joints. Finally, the patient was informed that Medicine is not an exact science; therefore, there is also the possibility of unforeseen or unpredictable risks and/or possible complications that may result in a catastrophic outcome. The patient indicated having understood very clearly. We have given the patient no guarantees and we have made no promises. Enough time was given to the patient to ask questions, all of which were answered to the patient's satisfaction. Lance Roman has indicated that he wanted to continue with the procedure. Attestation: I, the ordering provider, attest that I have discussed with the patient the benefits, risks, side-effects, alternatives, likelihood of achieving goals, and potential problems during recovery for the procedure that I have provided informed consent. Date  Time: 11/21/2017 10:06 AM  Pre-Procedure Preparation:  Monitoring: As per clinic protocol. Respiration, ETCO2, SpO2, BP, heart rate and rhythm monitor placed and checked for adequate function Safety Precautions: Patient was assessed for positional comfort and pressure points before starting the procedure. Time-out: I initiated and conducted the "Time-out" before starting the procedure, as per protocol. The patient was asked to participate by confirming the accuracy of the "Time Out" information. Verification of the correct person, site, and procedure were performed and confirmed by me, the nursing staff, and the patient. "Time-out" conducted as per Joint Commission's Universal Protocol (UP.01.01.01). Time: 1212  Description of Procedure:          Position: Prone Laterality: Left Levels:  L2, L3, L4, L5, & S1 Medial Branch Level(s), at the L3-4, L4-5, and the L5-S1 lumbar facet joints. Area Prepped: Lumbosacral Prepping solution: ChloraPrep (2% chlorhexidine gluconate and 70% isopropyl alcohol) Safety Precautions: Aspiration looking for blood return was  conducted prior to all injections. At no point did we inject any substances, as a needle was being advanced. Before injecting, the patient was told to immediately notify me if he was experiencing any new onset of "ringing in the ears, or metallic taste in the mouth". No attempts were made at seeking any  paresthesias. Safe injection practices and needle disposal techniques used. Medications properly checked for expiration dates. SDV (single dose vial) medications used. After the completion of the procedure, all disposable equipment used was discarded in the proper designated medical waste containers. Local Anesthesia: Protocol guidelines were followed. The patient was positioned over the fluoroscopy table. The area was prepped in the usual manner. The time-out was completed. The target area was identified using fluoroscopy. A 12-in long, straight, sterile hemostat was used with fluoroscopic guidance to locate the targets for each level blocked. Once located, the skin was marked with an approved surgical skin marker. Once all sites were marked, the skin (epidermis, dermis, and hypodermis), as well as deeper tissues (fat, connective tissue and muscle) were infiltrated with a small amount of a short-acting local anesthetic, loaded on a 10cc syringe with a 25G, 1.5-in  Needle. An appropriate amount of time was allowed for local anesthetics to take effect before proceeding to the next step. Local Anesthetic: Lidocaine 2.0% The unused portion of the local anesthetic was discarded in the proper designated containers. Technical explanation of process:  Radiofrequency Ablation (RFA) L2 Medial Branch Nerve RFA: The target area for the L2 medial branch is at the junction of the postero-lateral aspect of the superior articular process and the superior, posterior, and medial edge of the transverse process of L3. Under fluoroscopic guidance, a Radiofrequency needle was inserted until contact was made with os over the superior  postero-lateral aspect of the pedicular shadow (target area). Sensory and motor testing was conducted to properly adjust the position of the needle. Once satisfactory placement of the needle was achieved, the numbing solution was slowly injected after negative aspiration for blood. 2.0 mL of the nerve block solution was injected without difficulty or complication. After waiting for at least 3 minutes, the ablation was performed. Once completed, the needle was removed intact. L3 Medial Branch Nerve RFA: The target area for the L3 medial branch is at the junction of the postero-lateral aspect of the superior articular process and the superior, posterior, and medial edge of the transverse process of L4. Under fluoroscopic guidance, a Radiofrequency needle was inserted until contact was made with os over the superior postero-lateral aspect of the pedicular shadow (target area). Sensory and motor testing was conducted to properly adjust the position of the needle. Once satisfactory placement of the needle was achieved, the numbing solution was slowly injected after negative aspiration for blood. 2.0 mL of the nerve block solution was injected without difficulty or complication. After waiting for at least 3 minutes, the ablation was performed. Once completed, the needle was removed intact. L4 Medial Branch Nerve RFA: The target area for the L4 medial branch is at the junction of the postero-lateral aspect of the superior articular process and the superior, posterior, and medial edge of the transverse process of L5. Under fluoroscopic guidance, a Radiofrequency needle was inserted until contact was made with os over the superior postero-lateral aspect of the pedicular shadow (target area). Sensory and motor testing was conducted to properly adjust the position of the needle. Once satisfactory placement of the needle was achieved, the numbing solution was slowly injected after negative aspiration for blood. 2.0 mL of the  nerve block solution was injected without difficulty or complication. After waiting for at least 3 minutes, the ablation was performed. Once completed, the needle was removed intact. L5 Medial Branch Nerve RFA: The target area for the L5 medial branch is at the junction of the postero-lateral aspect of  the superior articular process of S1 and the superior, posterior, and medial edge of the sacral ala. Under fluoroscopic guidance, a Radiofrequency needle was inserted until contact was made with os over the superior postero-lateral aspect of the pedicular shadow (target area). Sensory and motor testing was conducted to properly adjust the position of the needle. Once satisfactory placement of the needle was achieved, the numbing solution was slowly injected after negative aspiration for blood. 2.0 mL of the nerve block solution was injected without difficulty or complication. After waiting for at least 3 minutes, the ablation was performed. Once completed, the needle was removed intact. S1 Medial Branch Nerve RFA: The target area for the S1 medial branch is located inferior to the junction of the S1 superior articular process and the L5 inferior articular process, posterior, inferior, and lateral to the 6 o'clock position of the L5-S1 facet joint, just superior to the S1 posterior foramen. Under fluoroscopic guidance, the Radiofrequency needle was advanced until contact was made with os over the Target area. Sensory and motor testing was conducted to properly adjust the position of the needle. Once satisfactory placement of the needle was achieved, the numbing solution was slowly injected after negative aspiration for blood. 2.0 mL of the nerve block solution was injected without difficulty or complication. After waiting for at least 3 minutes, the ablation was performed. Once completed, the needle was removed intact. Radiofrequency lesioning (ablation):  Radiofrequency Generator: NeuroTherm [id number] Sensory  Stimulation Parameters: 50 Hz was used to locate & identify the nerve, making sure that the needle was positioned such that there was no sensory stimulation below 0.3 V or above 0.7 V. Motor Stimulation Parameters: 2 Hz was used to evaluate the motor component. Care was taken not to lesion any nerves that demonstrated motor stimulation of the lower extremities at an output of less than 2.5 times that of the sensory threshold, or a maximum of 2.0 V. Lesioning Technique Parameters: Standard Radiofrequency settings. (Not bipolar or pulsed.) Temperature Settings: 80 degrees C Lesioning time: 60 seconds Intra-operative Compliance: Compliant Materials & Medications: Needle(s) (Electrode/Cannula) Type: Teflon-coated, curved tip, Radiofrequency needle(s) Gauge: 22G Length: 10cm Numbing solution: 0.2% PF-Ropivacaine + Triamcinolone (40 mg/mL) diluted to a final concentration of 4 mg of Triamcinolone/mL of Ropivacaine The unused portion of the solution was discarded in the proper designated containers.  Once the entire procedure was completed, the treated area was cleaned, making sure to leave some of the prepping solution back to take advantage of its long term bactericidal properties.  Illustration of the posterior view of the lumbar spine and the posterior neural structures. Laminae of L2 through S1 are labeled. DPRL5, dorsal primary ramus of L5; DPRS1, dorsal primary ramus of S1; DPR3, dorsal primary ramus of L3; FJ, facet (zygapophyseal) joint L3-L4; I, inferior articular process of L4; LB1, lateral branch of dorsal primary ramus of L1; IAB, inferior articular branches from L3 medial branch (supplies L4-L5 facet joint); IBP, intermediate branch plexus; MB3, medial branch of dorsal primary ramus of L3; NR3, third lumbar nerve root; S, superior articular process of L5; SAB, superior articular branches from L4 (supplies L4-5 facet joint also); TP3, transverse process of L3.  Vitals:   11/21/17 1240 11/21/17  1245 11/21/17 1250 11/21/17 1300  BP: 133/71 (!) 150/61 126/70 116/80  Pulse:      Resp: 12 12 13 14   Temp:      TempSrc:      SpO2: 97% 98% 98% 99%  Weight:  Height:        Start Time: 1212 hrs. End Time: 1246 hrs.  Imaging Guidance (Spinal):          Type of Imaging Technique: Fluoroscopy Guidance (Spinal) Indication(s): Assistance in needle guidance and placement for procedures requiring needle placement in or near specific anatomical locations not easily accessible without such assistance. Exposure Time: Please see nurses notes. Contrast: None used. Fluoroscopic Guidance: I was personally present during the use of fluoroscopy. "Tunnel Vision Technique" used to obtain the best possible view of the target area. Parallax error corrected before commencing the procedure. "Direction-depth-direction" technique used to introduce the needle under continuous pulsed fluoroscopy. Once target was reached, antero-posterior, oblique, and lateral fluoroscopic projection used confirm needle placement in all planes. Images permanently stored in EMR. Interpretation: No contrast injected. I personally interpreted the imaging intraoperatively. Adequate needle placement confirmed in multiple planes. Permanent images saved into the patient's record.  Antibiotic Prophylaxis:   Anti-infectives (From admission, onward)   None     Indication(s): None identified  Post-operative Assessment:  Post-procedure Vital Signs:  Pulse/HCG Rate: 60(!) 56 Temp: 98.3 F (36.8 C) Resp: 14 BP: 116/80 SpO2: 99 %  EBL: None  Complications: No immediate post-treatment complications observed by team, or reported by patient.  Note: The patient tolerated the entire procedure well. A repeat set of vitals were taken after the procedure and the patient was kept under observation following institutional policy, for this type of procedure. Post-procedural neurological assessment was performed, showing return to  baseline, prior to discharge. The patient was provided with post-procedure discharge instructions, including a section on how to identify potential problems. Should any problems arise concerning this procedure, the patient was given instructions to immediately contact us, at any time, without hesitation. In any case, we plan to contact the patient by telephone for a follow-up status report regarding this interventional procedure.  Comments:  No additional relevant information.  Plan of Care    Imaging Orders     DG C-Arm 1-60 Min-No Report  Procedure Orders     Radiofrequency,Lumbar     Radiofrequency,Lumbar  Medications ordered for procedure: Meds ordered this encounter  Medications  . gabapentin (NEURONTIN) 100 MG capsule    Sig: Take 1-3 capsules (100-300 mg total) by mouth at bedtime. Follow written titration schedule.    Dispense:  90 capsule    Refill:  1    Do not place medication on "Automatic Refill". Fill one day early if pharmacy is closed on scheduled refill date.  . esomeprazole (NEXIUM) 40 MG capsule    Sig: Take 1 capsule (40 mg total) by mouth 2 (two) times daily before a meal.    Dispense:  180 capsule    Refill:  0    Do not place medication on "Automatic Refill". Fill one day early if pharmacy is closed on scheduled refill date.  . lidocaine (XYLOCAINE) 2 % (with pres) injection 400 mg  . midazolam (VERSED) 5 MG/5ML injection 1-2 mg    Make sure Flumazenil is available in the pyxis when using this medication. If oversedation occurs, administer 0.2 mg IV over 15 sec. If after 45 sec no response, administer 0.2 mg again over 1 min; may repeat at 1 min intervals; not to exceed 4 doses (1 mg)  . fentaNYL (SUBLIMAZE) injection 25-50 mcg    Make sure Narcan is available in the pyxis when using this medication. In the event of respiratory depression (RR< 8/min): Titrate NARCAN (naloxone) in increments of 0.1  to 0.2 mg IV at 2-3 minute intervals, until desired degree of  reversal.  . lactated ringers infusion 1,000 mL  . ropivacaine (PF) 2 mg/mL (0.2%) (NAROPIN) injection 9 mL  . triamcinolone acetonide (KENALOG-40) injection 40 mg  . HYDROcodone-acetaminophen (NORCO/VICODIN) 5-325 MG tablet    Sig: Take 1 tablet by mouth every 6 (six) hours as needed for up to 7 days for severe pain.    Dispense:  28 tablet    Refill:  0    For acute post-operative pain. Not to be refilled. To last 7 days.  Marland Kitchen. HYDROcodone-acetaminophen (NORCO/VICODIN) 5-325 MG tablet    Sig: Take 1 tablet by mouth every 6 (six) hours as needed for up to 7 days for severe pain.    Dispense:  28 tablet    Refill:  0    For acute post-operative pain. Not to be refilled. To last 7 days.   Medications administered: We administered lidocaine, midazolam, fentaNYL, lactated ringers, ropivacaine (PF) 2 mg/mL (0.2%), and triamcinolone acetonide.  See the medical record for exact dosing, route, and time of administration.  New Prescriptions   HYDROCODONE-ACETAMINOPHEN (NORCO/VICODIN) 5-325 MG TABLET    Take 1 tablet by mouth every 6 (six) hours as needed for up to 7 days for severe pain.   HYDROCODONE-ACETAMINOPHEN (NORCO/VICODIN) 5-325 MG TABLET    Take 1 tablet by mouth every 6 (six) hours as needed for up to 7 days for severe pain.   Disposition: Discharge home  Discharge Date & Time: 11/21/2017; 1320 hrs.   Physician-requested Follow-up: Return for contralateral RFA (2 wks): (R) L-FCT RFA.  No future appointments. Primary Care Physician: Myrlene Brokerrawford, Elizabeth A, MD [facility] Note by: Oswaldo DoneFrancisco A Shaily Librizzi, MD Date: 11/21/2017; Time: 1:14 PM  Disclaimer:  Medicine is not an Visual merchandiserexact science. The only guarantee in medicine is that nothing is guaranteed. It is important to note that the decision to proceed with this intervention was based on the information collected from the patient. The Data and conclusions were drawn from the patient's questionnaire, the  interview, and the physical examination. Because the information was provided in large part by the patient, it cannot be guaranteed that it has not been purposely or unconsciously manipulated. Every effort has been made to obtain as much relevant data as possible for this evaluation. It is important to note that the conclusions that lead to this procedure are derived in large part from the available data. Always take into account that the treatment will also be dependent on availability of resources and existing treatment guidelines, considered by other Pain Management Practitioners as being common knowledge and practice, at the time of the intervention. For Medico-Legal purposes, it is also important to point out that variation in procedural techniques and pharmacological choices are the acceptable norm. The indications, contraindications, technique, and results of the above procedure should only be interpreted and judged by a Board-Certified Interventional Pain Specialist with extensive familiarity and expertise in the same exact procedure and technique.

## 2017-11-22 ENCOUNTER — Telehealth: Payer: Self-pay | Admitting: *Deleted

## 2017-11-22 NOTE — Telephone Encounter (Signed)
Attempted to call for post procedure follow-up. No answer, unable to leave a message. 

## 2018-01-03 ENCOUNTER — Telehealth: Payer: Self-pay | Admitting: *Deleted

## 2018-01-03 NOTE — Telephone Encounter (Signed)
Spoke with Larita Fife and gave her information of taper as well as post RF prescription x 2 weeks for post RF pain prescribed on 11/21/17.

## 2018-01-23 ENCOUNTER — Ambulatory Visit
Admission: RE | Admit: 2018-01-23 | Discharge: 2018-01-23 | Disposition: A | Discharge status: Home / Self Care | Payer: No Typology Code available for payment source | Source: Ambulatory Visit | Attending: Pain Medicine | Admitting: Pain Medicine

## 2018-01-23 ENCOUNTER — Other Ambulatory Visit: Payer: Self-pay

## 2018-01-23 ENCOUNTER — Encounter: Payer: Self-pay | Admitting: Pain Medicine

## 2018-01-23 ENCOUNTER — Ambulatory Visit (HOSPITAL_BASED_OUTPATIENT_CLINIC_OR_DEPARTMENT_OTHER): Payer: No Typology Code available for payment source | Admitting: Pain Medicine

## 2018-01-23 VITALS — BP 128/54 | HR 60 | Temp 98.0°F | Resp 16 | Ht 70.0 in | Wt 190.0 lb

## 2018-01-23 DIAGNOSIS — M47816 Spondylosis without myelopathy or radiculopathy, lumbar region: Secondary | ICD-10-CM

## 2018-01-23 DIAGNOSIS — G8929 Other chronic pain: Secondary | ICD-10-CM

## 2018-01-23 DIAGNOSIS — H25013 Cortical age-related cataract, bilateral: Secondary | ICD-10-CM | POA: Diagnosis not present

## 2018-01-23 DIAGNOSIS — G894 Chronic pain syndrome: Secondary | ICD-10-CM | POA: Diagnosis not present

## 2018-01-23 DIAGNOSIS — M4310 Spondylolisthesis, site unspecified: Secondary | ICD-10-CM

## 2018-01-23 DIAGNOSIS — M5442 Lumbago with sciatica, left side: Secondary | ICD-10-CM

## 2018-01-23 DIAGNOSIS — K219 Gastro-esophageal reflux disease without esophagitis: Secondary | ICD-10-CM | POA: Diagnosis not present

## 2018-01-23 DIAGNOSIS — M47817 Spondylosis without myelopathy or radiculopathy, lumbosacral region: Secondary | ICD-10-CM | POA: Diagnosis not present

## 2018-01-23 DIAGNOSIS — M545 Low back pain: Secondary | ICD-10-CM | POA: Insufficient documentation

## 2018-01-23 DIAGNOSIS — H40033 Anatomical narrow angle, bilateral: Secondary | ICD-10-CM | POA: Diagnosis not present

## 2018-01-23 DIAGNOSIS — H40023 Open angle with borderline findings, high risk, bilateral: Secondary | ICD-10-CM | POA: Diagnosis not present

## 2018-01-23 DIAGNOSIS — M792 Neuralgia and neuritis, unspecified: Secondary | ICD-10-CM

## 2018-01-23 DIAGNOSIS — H40053 Ocular hypertension, bilateral: Secondary | ICD-10-CM | POA: Diagnosis not present

## 2018-01-23 MED ORDER — TRIAMCINOLONE ACETONIDE 40 MG/ML IJ SUSP
INTRAMUSCULAR | Status: AC
  Filled 2018-01-23: qty 1

## 2018-01-23 MED ORDER — ROPIVACAINE HCL 2 MG/ML IJ SOLN
INTRAMUSCULAR | Status: AC
  Filled 2018-01-23: qty 10

## 2018-01-23 MED ORDER — OXYCODONE HCL 10 MG PO TABS: 10.0000 mg | ORAL_TABLET | Freq: Four times a day (QID) | ORAL | 0 refills | Status: DC | PRN

## 2018-01-23 MED ORDER — GABAPENTIN 100 MG PO CAPS: 100.0000 mg | ORAL_CAPSULE | Freq: Every day | ORAL | 2 refills | Status: DC

## 2018-01-23 MED ORDER — MIDAZOLAM HCL 5 MG/5ML IJ SOLN
INTRAMUSCULAR | Status: AC
  Filled 2018-01-23: qty 5

## 2018-01-23 MED ORDER — LIDOCAINE HCL 2 % IJ SOLN
20.0000 mL | Freq: Once | INTRAMUSCULAR | Status: AC
  Administered 2018-01-23: 400 mg

## 2018-01-23 MED ORDER — ROPIVACAINE HCL 2 MG/ML IJ SOLN
9.0000 mL | Freq: Once | INTRAMUSCULAR | Status: AC
  Administered 2018-01-23: 10 mL via PERINEURAL

## 2018-01-23 MED ORDER — CELECOXIB 200 MG PO CAPS: 200.0000 mg | ORAL_CAPSULE | Freq: Two times a day (BID) | ORAL | 1 refills | Status: DC

## 2018-01-23 MED ORDER — FENTANYL CITRATE (PF) 100 MCG/2ML IJ SOLN
25.0000 ug | INTRAMUSCULAR | Status: DC | PRN
  Administered 2018-01-23: 100 ug via INTRAVENOUS

## 2018-01-23 MED ORDER — TRIAMCINOLONE ACETONIDE 40 MG/ML IJ SUSP
40.0000 mg | Freq: Once | INTRAMUSCULAR | Status: AC
  Administered 2018-01-23: 40 mg

## 2018-01-23 MED ORDER — ESOMEPRAZOLE MAGNESIUM 40 MG PO CPDR: 40.0000 mg | DELAYED_RELEASE_CAPSULE | Freq: Two times a day (BID) | ORAL | 5 refills | Status: DC

## 2018-01-23 MED ORDER — FENTANYL CITRATE (PF) 100 MCG/2ML IJ SOLN
INTRAMUSCULAR | Status: AC
  Filled 2018-01-23: qty 2

## 2018-01-23 MED ORDER — LIDOCAINE HCL 2 % IJ SOLN
INTRAMUSCULAR | Status: AC
  Filled 2018-01-23: qty 20

## 2018-01-23 MED ORDER — LACTATED RINGERS IV SOLN
1000.0000 mL | Freq: Once | INTRAVENOUS | Status: AC
  Administered 2018-01-23: 1000 mL via INTRAVENOUS

## 2018-01-23 MED ORDER — MIDAZOLAM HCL 5 MG/5ML IJ SOLN
1.0000 mg | INTRAMUSCULAR | Status: DC | PRN
  Administered 2018-01-23: 3 mg via INTRAVENOUS

## 2018-01-23 NOTE — Progress Notes (Signed)
Safety precautions to be maintained throughout the outpatient stay will include: orient to surroundings, keep bed in low position, maintain call bell within reach at all times, provide assistance with transfer out of bed and ambulation.  

## 2018-01-23 NOTE — Progress Notes (Signed)
Patient's Name: Lance Roman  MRN: [id number]  Referring Provider: Delano Metz, MD  DOB: 16-Nov-1960  PCP: Myrlene Broker, MD  DOS: 01/23/2018  Note by: Oswaldo Done, MD  Service setting: Ambulatory outpatient  Specialty: Interventional Pain Management  Patient type: Established  Location: [facility] (AMB) Pain Management Facility  Visit type: Interventional Procedure   Primary Reason for Visit: Interventional Pain Management Treatment. CC: Back Pain (lower)  Procedure:          Anesthesia, Analgesia, Anxiolysis:  Type: Thermal Lumbar Facet, Medial Branch Radiofrequency Ablation/Neurotomy  #1  Primary Purpose: Therapeutic Region: Posterolateral Lumbosacral Spine Level: L2, L3, L4, L5, & S1 Medial Branch Level(s). These levels will denervate the L3-4, L4-5, and the L5-S1 lumbar facet joints. Laterality: Right  Type: Moderate (Conscious) Sedation combined with Local Anesthesia Indication(s): Analgesia and Anxiety Route: Intravenous (IV) IV Access: Secured Sedation: Meaningful verbal contact was maintained at all times during the procedure  Local Anesthetic: Lidocaine 1-2%  Position: Prone   Indications: 1. Spondylosis without myelopathy or radiculopathy, lumbosacral region   2. Lumbar facet syndrome (Bilateral) (L>R)   3. Lumbar facet osteoarthritis (Bilateral)   4. Lumbar facet hypertrophy (Bilateral)   5. Retrolisthesis (4-5 mm) (L3 over L4) (L2 over L3   6. Osteoarthritis of lumbar spine   7. Chronic low back pain (Primary Area of Pain) (Bilateral) (L>R)   8. Gastroesophageal reflux disease without esophagitis   9. Neurogenic pain   10. Chronic pain syndrome    Lance Roman has been dealing with the above chronic pain for longer than three months and has either failed to respond, was unable to tolerate, or simply did not get enough benefit from other more conservative therapies including, but not limited to: 1. Over-the-counter medications 2. Anti-inflammatory  medications 3. Muscle relaxants 4. Membrane stabilizers 5. Opioids 6. Physical therapy and/or chiropractic manipulation 7. Modalities (Heat, ice, etc.) 8. Invasive techniques such as nerve blocks. Lance Roman has attained more than 50% relief of the pain from a series of diagnostic injections conducted in separate occasions.  Pain Score: Pre-procedure: 3 /10 Post-procedure: 0-No pain/10  Post-Procedure Assessment  11/21/2017 Procedure: Therapeutic left-sided lumbar facet medial branch radiofrequency ablation #1 under fluoroscopic guidance and IV sedation (Post-procedure day # 62) Pre-procedure pain score:  5/10 Post-procedure pain score: 0/10 (100% relief) Influential Factors: BMI: 27.26 kg/m Intra-procedural challenges: None observed.         Assessment challenges: Results reported today are inconsistent with those reported on procedure day.        Before discharge, the patient had previously reported 100% relief of the pain. Reported side-effects: None.        Post-procedural adverse reactions or complications: None reported         Sedation: Sedation provided. When no sedatives are used, the analgesic levels obtained are directly associated to the effectiveness of the local anesthetics. However, when sedation is provided, the level of analgesia obtained during the initial 1 hour following the intervention, is believed to be the result of a combination of factors. These factors may include, but are not limited to: 1. The effectiveness of the local anesthetics used. 2. The effects of the analgesic(s) and/or anxiolytic(s) used. 3. The degree of discomfort experienced by the patient at the time of the procedure. 4. The patients ability and reliability in recalling and recording the events. 5. The presence and influence of possible secondary gains and/or psychosocial factors. Reported result: Relief experienced during the 1st hour after the procedure: 0 % (  Ultra-Short Term Relief)             Interpretative annotation: Unreliable result. Pharmacodynamically inconsistent response. Patient does not appear to have understood instructions on differential evaluation of treated vs untreated area, leading to an inaccurate global report  Effects of local anesthetic: The analgesic effects attained during this period are directly associated to the localized infiltration of local anesthetics and therefore cary significant diagnostic value as to the etiological location, or anatomical origin, of the pain. Expected duration of relief is directly dependent on the pharmacodynamics of the local anesthetic used. Long-acting (4-6 hours) anesthetics used.  Reported result: Relief during the next 4 to 6 hour after the procedure: 0 % (Short-Term Relief)            Interpretative annotation: Unexpected result. Poor/inaccurate patient record keeping and/or recollection.          Long-term benefit: Defined as the period of time past the expected duration of local anesthetics (1 hour for short-acting and 4-6 hours for long-acting). With the possible exception of prolonged sympathetic blockade from the local anesthetics, benefits during this period are typically attributed to, or associated with, other factors such as analgesic sensory neuropraxia, antiinflammatory effects, or beneficial biochemical changes provided by agents other than the local anesthetics.  Reported result: Extended relief following procedure: 0 % (Long-Term Relief)            Interpretative annotation: Unexpected result. No benefit. Therapeutic failure. Persistent algesic mechanism detected.          Current benefits: Defined as reported results that persistent at this point in time.   Analgesia: 0 %            Function: Back to baseline ROM: Back to baseline Interpretative annotation: The patient seems to think that the reason why he did not get any benefit is because he did not clearly understand the instructions during the procedure.  He  now says that he might of been indicating that he was feeling stimulation when in fact he was not.    This meant suggest that it may be necessary for [place] to repeat the radiofrequency on that side in order to get appropriate results.          Interpretation: Results would suggest failure of therapy in achieving desired goal(s).                  Plan:  Proceed with radiofrequency ablation of the opposite side. Depending on how the patient does today, we may repeat the radiofrequency on the opposite side.          Pre-op Assessment:  Mr. Neville is a 57 y.o. (year old), male patient, seen today for interventional treatment. He  has a past surgical history that includes Appendectomy; repairs of bilateral ankle fractures; Spinal fusion (N/A); and Spinal cord stimulator implant (N/A). Mr. Bangura has a current medication list which includes the following prescription(s): ibuprofen, olopatadine, celecoxib, docusate sodium, esomeprazole, gabapentin, multivitamin, oxycodone hcl, and oxycodone hcl, and the following Facility-Administered Medications: fentanyl and midazolam. His primarily concern today is the Back Pain (lower)  When the patient first came to our clinic for the first evaluation on 12/12/2016 his pain was described as a 5/10 and he was using the following amount of opioid analgesics to keep it at that 5/10. Initial opioid analgesics on 12/12/16: Morphine ER 30 mg every 8 hours + morphine IR 15 mg every 6 hours (150 MME/day)   Highest recorded MME/day: 315 mg/day MME/day:  150 mg/day Over the next year, we have been tapering his opioids down to the point where he currently is at the tail and of his "drug holiday" and his opioid analgesic intake is "0".  In order to reach this level, we have been slowly tapering his opioids down at a rate that has kept him away from experiencing withdrawals.  However, we needed to also control his pain and for that we have been doing a series of bilateral diagnostic lumbar  facet blocks, which did provide him with good relief of the pain, however, they would not be long-lasting.  Because of this, we turned to the alternative of radiofrequency ablation of the lumbar facets.  His left side was done on 11/21/2017 and he comes into the clinic today to have his right side done.  Since he has completed his "drug holiday" we will also put him back on oxycodone IR 10 mg every 6 hours for a total of 40 mg/day of oxycodone, which represents an MME of 60 mg/day.  This represents a less than 50% drop in his opioid analgesic consumption.  Over time, we will be reassessing his needs and if need be, we will bring it down further.  Initial Vital Signs:  Pulse/HCG Rate: 60ECG Heart Rate: 60 Temp: 98.2 F (36.8 C) Resp: 16 BP: (!) 146/89 SpO2: 100 %  BMI: Estimated body mass index is 27.26 kg/m as calculated from the following:   Height as of this encounter: 5\' 10"  (1.778 m).   Weight as of this encounter: 190 lb (86.2 kg).  Risk Assessment: Allergies: Reviewed. He has No Known Allergies.  Allergy Precautions: None required Coagulopathies: Reviewed. None identified.  Blood-thinner therapy: None at this time Active Infection(s): Reviewed. None identified. Mr. Afonso is afebrile  Site Confirmation: Mr. Agostino was asked to confirm the procedure and laterality before marking the site Procedure checklist: Completed Consent: Before the procedure and under the influence of no sedative(s), amnesic(s), or anxiolytics, the patient was informed of the treatment options, risks and possible complications. To fulfill our ethical and legal obligations, as recommended by the American Medical Association's Code of Ethics, I have informed the patient of my clinical impression; the nature and purpose of the treatment or procedure; the risks, benefits, and possible complications of the intervention; the alternatives, including doing nothing; the risk(s) and benefit(s) of the alternative treatment(s)  or procedure(s); and the risk(s) and benefit(s) of doing nothing. The patient was provided information about the general risks and possible complications associated with the procedure. These may include, but are not limited to: failure to achieve desired goals, infection, bleeding, organ or nerve damage, allergic reactions, paralysis, and death. In addition, the patient was informed of those risks and complications associated to Spine-related procedures, such as failure to decrease pain; infection (i.e.: Meningitis, epidural or intraspinal abscess); bleeding (i.e.: epidural hematoma, subarachnoid hemorrhage, or any other type of intraspinal or peri-dural bleeding); organ or nerve damage (i.e.: Any type of peripheral nerve, nerve root, or spinal cord injury) with subsequent damage to sensory, motor, and/or autonomic systems, resulting in permanent pain, numbness, and/or weakness of one or several areas of the body; allergic reactions; (i.e.: anaphylactic reaction); and/or death. Furthermore, the patient was informed of those risks and complications associated with the medications. These include, but are not limited to: allergic reactions (i.e.: anaphylactic or anaphylactoid reaction(s)); adrenal axis suppression; blood sugar elevation that in diabetics may result in ketoacidosis or comma; water retention that in patients with history of congestive heart  failure may result in shortness of breath, pulmonary edema, and decompensation with resultant heart failure; weight gain; swelling or edema; medication-induced neural toxicity; particulate matter embolism and blood vessel occlusion with resultant organ, and/or nervous system infarction; and/or aseptic necrosis of one or more joints. Finally, the patient was informed that Medicine is not an exact science; therefore, there is also the possibility of unforeseen or unpredictable risks and/or possible complications that may result in a catastrophic outcome. The patient  indicated having understood very clearly. We have given the patient no guarantees and we have made no promises. Enough time was given to the patient to ask questions, all of which were answered to the patient's satisfaction. Mr. Cordaro has indicated that he wanted to continue with the procedure. Attestation: I, the ordering provider, attest that I have discussed with the patient the benefits, risks, side-effects, alternatives, likelihood of achieving goals, and potential problems during recovery for the procedure that I have provided informed consent. Date  Time: 01/23/2018  1:50 PM  Pre-Procedure Preparation:  Monitoring: As per clinic protocol. Respiration, ETCO2, SpO2, BP, heart rate and rhythm monitor placed and checked for adequate function Safety Precautions: Patient was assessed for positional comfort and pressure points before starting the procedure. Time-out: I initiated and conducted the "Time-out" before starting the procedure, as per protocol. The patient was asked to participate by confirming the accuracy of the "Time Out" information. Verification of the correct person, site, and procedure were performed and confirmed by me, the nursing staff, and the patient. "Time-out" conducted as per Joint Commission's Universal Protocol (UP.01.01.01). Time: 1453  Description of Procedure:          Laterality: Right Levels:  L2, L3, L4, L5, & S1 Medial Branch Level(s), at the L3-4, L4-5, and the L5-S1 lumbar facet joints. Area Prepped: Lumbosacral Prepping solution: ChloraPrep (2% chlorhexidine gluconate and 70% isopropyl alcohol) Safety Precautions: Aspiration looking for blood return was conducted prior to all injections. At no point did we inject any substances, as a needle was being advanced. Before injecting, the patient was told to immediately notify me if he was experiencing any new onset of "ringing in the ears, or metallic taste in the mouth". No attempts were made at seeking any  paresthesias. Safe injection practices and needle disposal techniques used. Medications properly checked for expiration dates. SDV (single dose vial) medications used. After the completion of the procedure, all disposable equipment used was discarded in the proper designated medical waste containers. Local Anesthesia: Protocol guidelines were followed. The patient was positioned over the fluoroscopy table. The area was prepped in the usual manner. The time-out was completed. The target area was identified using fluoroscopy. A 12-in long, straight, sterile hemostat was used with fluoroscopic guidance to locate the targets for each level blocked. Once located, the skin was marked with an approved surgical skin marker. Once all sites were marked, the skin (epidermis, dermis, and hypodermis), as well as deeper tissues (fat, connective tissue and muscle) were infiltrated with a small amount of a short-acting local anesthetic, loaded on a 10cc syringe with a 25G, 1.5-in  Needle. An appropriate amount of time was allowed for local anesthetics to take effect before proceeding to the next step. Local Anesthetic: Lidocaine 2.0% The unused portion of the local anesthetic was discarded in the proper designated containers. Technical explanation of process:  Radiofrequency Ablation (RFA) L2 Medial Branch Nerve RFA: The target area for the L2 medial branch is at the junction of the postero-lateral aspect of the superior  articular process and the superior, posterior, and medial edge of the transverse process of L3. Under fluoroscopic guidance, a Radiofrequency needle was inserted until contact was made with os over the superior postero-lateral aspect of the pedicular shadow (target area). Sensory and motor testing was conducted to properly adjust the position of the needle. Once satisfactory placement of the needle was achieved, the numbing solution was slowly injected after negative aspiration for blood. 2.0 mL of the nerve  block solution was injected without difficulty or complication. After waiting for at least 3 minutes, the ablation was performed. Once completed, the needle was removed intact. L3 Medial Branch Nerve RFA: The target area for the L3 medial branch is at the junction of the postero-lateral aspect of the superior articular process and the superior, posterior, and medial edge of the transverse process of L4. Under fluoroscopic guidance, a Radiofrequency needle was inserted until contact was made with os over the superior postero-lateral aspect of the pedicular shadow (target area). Sensory and motor testing was conducted to properly adjust the position of the needle. Once satisfactory placement of the needle was achieved, the numbing solution was slowly injected after negative aspiration for blood. 2.0 mL of the nerve block solution was injected without difficulty or complication. After waiting for at least 3 minutes, the ablation was performed. Once completed, the needle was removed intact. L4 Medial Branch Nerve RFA: The target area for the L4 medial branch is at the junction of the postero-lateral aspect of the superior articular process and the superior, posterior, and medial edge of the transverse process of L5. Under fluoroscopic guidance, a Radiofrequency needle was inserted until contact was made with os over the superior postero-lateral aspect of the pedicular shadow (target area). Sensory and motor testing was conducted to properly adjust the position of the needle. Once satisfactory placement of the needle was achieved, the numbing solution was slowly injected after negative aspiration for blood. 2.0 mL of the nerve block solution was injected without difficulty or complication. After waiting for at least 3 minutes, the ablation was performed. Once completed, the needle was removed intact. L5 Medial Branch Nerve RFA: The target area for the L5 medial branch is at the junction of the postero-lateral aspect  of the superior articular process of S1 and the superior, posterior, and medial edge of the sacral ala. Under fluoroscopic guidance, a Radiofrequency needle was inserted until contact was made with os over the superior postero-lateral aspect of the pedicular shadow (target area). Sensory and motor testing was conducted to properly adjust the position of the needle. Once satisfactory placement of the needle was achieved, the numbing solution was slowly injected after negative aspiration for blood. 2.0 mL of the nerve block solution was injected without difficulty or complication. After waiting for at least 3 minutes, the ablation was performed. Once completed, the needle was removed intact. S1 Medial Branch Nerve RFA: The target area for the S1 medial branch is located inferior to the junction of the S1 superior articular process and the L5 inferior articular process, posterior, inferior, and lateral to the 6 o'clock position of the L5-S1 facet joint, just superior to the S1 posterior foramen. Under fluoroscopic guidance, the Radiofrequency needle was advanced until contact was made with os over the Target area. Sensory and motor testing was conducted to properly adjust the position of the needle. Once satisfactory placement of the needle was achieved, the numbing solution was slowly injected after negative aspiration for blood. 2.0 mL of the nerve block solution  was injected without difficulty or complication. After waiting for at least 3 minutes, the ablation was performed. Once completed, the needle was removed intact. Radiofrequency lesioning (ablation):  Radiofrequency Generator: NeuroTherm [id number] Sensory Stimulation Parameters: 50 Hz was used to locate & identify the nerve, making sure that the needle was positioned such that there was no sensory stimulation below 0.3 V or above 0.7 V. Motor Stimulation Parameters: 2 Hz was used to evaluate the motor component. Care was taken not to lesion any nerves that  demonstrated motor stimulation of the lower extremities at an output of less than 2.5 times that of the sensory threshold, or a maximum of 2.0 V. Lesioning Technique Parameters: Standard Radiofrequency settings. (Not bipolar or pulsed.) Temperature Settings: 80 degrees C Lesioning time: 60 seconds Intra-operative Compliance: Compliant Materials & Medications: Needle(s) (Electrode/Cannula) Type: Teflon-coated, curved tip, Radiofrequency needle(s) Gauge: 22G Length: 10cm Numbing solution: 0.2% PF-Ropivacaine + Triamcinolone (40 mg/mL) diluted to a final concentration of 4 mg of Triamcinolone/mL of Ropivacaine The unused portion of the solution was discarded in the proper designated containers.  Once the entire procedure was completed, the treated area was cleaned, making sure to leave some of the prepping solution back to take advantage of its long term bactericidal properties.  Illustration of the posterior view of the lumbar spine and the posterior neural structures. Laminae of L2 through S1 are labeled. DPRL5, dorsal primary ramus of L5; DPRS1, dorsal primary ramus of S1; DPR3, dorsal primary ramus of L3; FJ, facet (zygapophyseal) joint L3-L4; I, inferior articular process of L4; LB1, lateral branch of dorsal primary ramus of L1; IAB, inferior articular branches from L3 medial branch (supplies L4-L5 facet joint); IBP, intermediate branch plexus; MB3, medial branch of dorsal primary ramus of L3; NR3, third lumbar nerve root; S, superior articular process of L5; SAB, superior articular branches from L4 (supplies L4-5 facet joint also); TP3, transverse process of L3.  Vitals:   01/23/18 1549 01/23/18 1559 01/23/18 1609 01/23/18 1616  BP: 132/85  125/88 (!) 128/54  Pulse:      Resp: 16 12 12 16   Temp:  98 F (36.7 C)  98 F (36.7 C)  TempSrc:  Temporal  Temporal  SpO2: 99% 100% 100% 100%  Weight:      Height:        Start Time: 1453 hrs. End Time: 1547 hrs.  Imaging Guidance (Spinal):           Type of Imaging Technique: Fluoroscopy Guidance (Spinal) Indication(s): Assistance in needle guidance and placement for procedures requiring needle placement in or near specific anatomical locations not easily accessible without such assistance. Exposure Time: Please see nurses notes. Contrast: None used. Fluoroscopic Guidance: I was personally present during the use of fluoroscopy. "Tunnel Vision Technique" used to obtain the best possible view of the target area. Parallax error corrected before commencing the procedure. "Direction-depth-direction" technique used to introduce the needle under continuous pulsed fluoroscopy. Once target was reached, antero-posterior, oblique, and lateral fluoroscopic projection used confirm needle placement in all planes. Images permanently stored in EMR. Interpretation: No contrast injected. I personally interpreted the imaging intraoperatively. Adequate needle placement confirmed in multiple planes. Permanent images saved into the patient's record.  Antibiotic Prophylaxis:   Anti-infectives (From admission, onward)   None     Indication(s): None identified  Post-operative Assessment:  Post-procedure Vital Signs:  Pulse/HCG Rate: 60(!) 57 Temp: 98 F (36.7 C) Resp: 16 BP: (!) 128/54 SpO2: 100 %  EBL: None  Complications: No immediate post-treatment  complications observed by team, or reported by patient.  Note: The patient tolerated the entire procedure well. A repeat set of vitals were taken after the procedure and the patient was kept under observation following institutional policy, for this type of procedure. Post-procedural neurological assessment was performed, showing return to baseline, prior to discharge. The patient was provided with post-procedure discharge instructions, including a section on how to identify potential problems. Should any problems arise concerning this procedure, the patient was given instructions to immediately contact  us, at any time, without hesitation. In any case, we plan to contact the patient by telephone for a follow-up status report regarding this interventional procedure.  Comments:  No additional relevant information.  Plan of Care   Imaging Orders     DG C-Arm 1-60 Min-No Report  Procedure Orders     Radiofrequency,Lumbar  Medications ordered for procedure: Meds ordered this encounter  Medications  . lidocaine (XYLOCAINE) 2 % (with pres) injection 400 mg  . midazolam (VERSED) 5 MG/5ML injection 1-2 mg    Make sure Flumazenil is available in the pyxis when using this medication. If oversedation occurs, administer 0.2 mg IV over 15 sec. If after 45 sec no response, administer 0.2 mg again over 1 min; may repeat at 1 min intervals; not to exceed 4 doses (1 mg)  . fentaNYL (SUBLIMAZE) injection 25-50 mcg    Make sure Narcan is available in the pyxis when using this medication. In the event of respiratory depression (RR< 8/min): Titrate NARCAN (naloxone) in increments of 0.1 to 0.2 mg IV at 2-3 minute intervals, until desired degree of reversal.  . lactated ringers infusion 1,000 mL  . ropivacaine (PF) 2 mg/mL (0.2%) (NAROPIN) injection 9 mL  . triamcinolone acetonide (KENALOG-40) injection 40 mg  . esomeprazole (NEXIUM) 40 MG capsule    Sig: Take 1 capsule (40 mg total) by mouth 2 (two) times daily before a meal.    Dispense:  60 capsule    Refill:  5    Do not place medication on "Automatic Refill". Fill one day early if pharmacy is closed on scheduled refill date.  . celecoxib (CELEBREX) 200 MG capsule    Sig: Take 1 capsule (200 mg total) by mouth 2 (two) times daily.    Dispense:  180 capsule    Refill:  1    Do not place medication on "Automatic Refill". Fill one day early if pharmacy is closed on scheduled refill date.  . gabapentin (NEURONTIN) 100 MG capsule    Sig: Take 1-3 capsules (100-300 mg total) by mouth at bedtime. Follow written titration schedule.    Dispense:  90 capsule     Refill:  2    Do not place medication on "Automatic Refill". Fill one day early if pharmacy is closed on scheduled refill date.  . Oxycodone HCl 10 MG TABS    Sig: Take 1 tablet (10 mg total) by mouth every 6 (six) hours as needed. Must last 30 days.    Dispense:  120 tablet    Refill:  0    [place] STOP ACT - Not applicable. Fill one day early if pharmacy is closed on scheduled refill date. Must last 30 days. To be filled on: 01/23/18. To last until: 02/22/18.  Marland Kitchen Oxycodone HCl 10 MG TABS    Sig: Take 1 tablet (10 mg total) by mouth every 6 (six) hours as needed. Must last 30 days.    Dispense:  120 tablet    Refill:  0    [place] STOP ACT - Not applicable. Fill one day early if pharmacy is closed on scheduled refill date. Must last 30 days. To be filled on: 02/22/18. To last until: 03/24/18.   Medications administered: We administered lidocaine, midazolam, fentaNYL, lactated ringers, ropivacaine (PF) 2 mg/mL (0.2%), and triamcinolone acetonide.  See the medical record for exact dosing, route, and time of administration.  Disposition: Discharge home  Discharge Date & Time: 01/23/2018; 1616 hrs.   Physician-requested Follow-up: Return for post-procedure eval (2 wks), w/ Dr. Laban Emperor.  Future Appointments  Date Time Provider Department Center  02/07/2018  1:45 PM Delano Metz, MD [facility] None   Primary Care Physician: Myrlene Broker, MD Location: [facility] Note by: Oswaldo Done, MD Date: 01/23/2018; Time: 4:33 PM  Disclaimer:  Medicine is not an Visual merchandiser. The only guarantee in medicine is that nothing is guaranteed. It is important to note that the decision to proceed with this intervention was based on the information collected from the patient. The Data and conclusions were drawn from the patient's questionnaire, the interview, and the physical examination. Because the information was provided in large part by the patient, it cannot  be guaranteed that it has not been purposely or unconsciously manipulated. Every effort has been made to obtain as much relevant data as possible for this evaluation. It is important to note that the conclusions that lead to this procedure are derived in large part from the available data. Always take into account that the treatment will also be dependent on availability of resources and existing treatment guidelines, considered by other Pain Management Practitioners as being common knowledge and practice, at the time of the intervention. For Medico-Legal purposes, it is also important to point out that variation in procedural techniques and pharmacological choices are the acceptable norm. The indications, contraindications, technique, and results of the above procedure should only be interpreted and judged by a Board-Certified Interventional Pain Specialist with extensive familiarity and expertise in the same exact procedure and technique.

## 2018-01-23 NOTE — Patient Instructions (Addendum)
___________________________________________________________________________________________  Post-Radiofrequency (RF) Discharge Instructions  You have just completed a Radiofrequency Neurotomy.  The following instructions will provide you with information and guidelines for self-care upon discharge.  If at any time you have questions or concerns please call your physician. DO NOT DRIVE YOURSELF!!  Instructions:  Apply ice: Fill a plastic sandwich bag with crushed ice. Cover it with a small towel and apply to injection site. Apply for 15 minutes then remove x 15 minutes. Repeat sequence on day of procedure, until you go to bed. The purpose is to minimize swelling and discomfort after procedure.  Apply heat: Apply heat to procedure site starting the day following the procedure. The purpose is to treat any soreness and discomfort from the procedure.  Food intake: No eating limitations, unless stipulated above.  Nevertheless, if you have had sedation, you may experience some nausea.  In this case, it may be wise to wait at least two hours prior to resuming regular diet.  Physical activities: Keep activities to a minimum for the first 8 hours after the procedure. For the first 24 hours after the procedure, do not drive a motor vehicle,  Operate heavy machinery, power tools, or handle any weapons.  Consider walking with the use of an assistive device or accompanied by an adult for the first 24 hours.  Do not drink alcoholic beverages including beer.  Do not make any important decisions or sign any legal documents. Go home and rest today.  Resume activities tomorrow, as tolerated.  Use caution in moving about as you may experience mild leg weakness.  Use caution in cooking, use of household electrical appliances and climbing steps.  Driving: If you have received any sedation, you are not allowed to drive for 24 hours after your procedure.  Blood thinner: Restart your blood thinner 6 hours after your  procedure. (Only for those taking blood thinners)  Insulin: As soon as you can eat, you may resume your normal dosing schedule. (Only for those taking insulin)  Medications: May resume pre-procedure medications.  Do not take any drugs, other than what has been prescribed to you.  Infection prevention: Keep procedure site clean and dry.  Post-procedure Pain Diary: Extremely important that this be done correctly and accurately. Recorded information will be used to determine the next step in treatment.  Pain evaluated is that of treated area only. Do not include pain from an untreated area.  Complete every hour, on the hour, for the initial 8 hours. Set an alarm to help you do this part accurately.  Do not go to sleep and have it completed later. It will not be accurate.  Follow-up appointment: Keep your follow-up appointment after the procedure. Usually 2 weeks for most procedures. (6 weeks in the case of radiofrequency.) Bring you pain diary.   Expect:  From numbing medicine (AKA: Local Anesthetics): Numbness or decrease in pain.  Onset: Full effect within 15 minutes of injected.  Duration: It will depend on the type of local anesthetic used. On the average, 1 to 8 hours.   From steroids: Decrease in swelling or inflammation. Once inflammation is improved, relief of the pain will follow.  Onset of benefits: Depends on the amount of swelling present. The more swelling, the longer it will take for the benefits to be seen. In some cases, up to 10 days.  Duration: Steroids will stay in the system x 2 weeks. Duration of benefits will depend on multiple posibilities including persistent irritating factors.  From procedure: Some   discomfort is to be expected once the numbing medicine wears off. This should be minimal if ice and heat are applied as instructed.  Call if:  You experience numbness and weakness that gets worse with time, as opposed to wearing off.  He experience any unusual  bleeding, difficulty breathing, or loss of the ability to control your bowel and bladder. (This applies to Spinal procedures only)  You experience any redness, swelling, heat, red streaks, elevated temperature, fever, or any other signs of a possible infection.  Emergency Numbers:  Durning business hours (Monday - Thursday, 8:00 AM - 4:00 PM) (Friday, 9:00 AM - 12:00 Noon): [phone]  After hours: [phone] ____________________________________________________________________________________________   ____________________________________________________________________________________________  Initial Gabapentin Titration  Medication used: Gabapentin (Generic Name) or Neurontin (Brand Name) 100 mg tablets/capsules  Reasons to stop increasing the dose:  Reason 1: You get good relief of symptoms, in which case there is no need to increase the daily dose any further.    Reason 2: You develop some side effects, such as sleeping all of the time, difficulty concentrating, or becoming disoriented, in which case you need to go down on the dose, to the prior level, where you were not experiencing any side effects. Stay on that dose longer, to allow more time for your body to get use it, before attempting to increase it again.   Steps: Step 1: Start by taking 1 (one) tablet at bedtime x 7 (seven) days.  Step 2: After being on 1 (one) tablet for 7 (seven) days, then increase it to 2 (two) tablets at bedtime for another 7 (seven) days.  Step 3: Next, after being on 2 (two) tablets at bedtime for 7 (seven) days, then increase it to 3 (three) tablets at bedtime, and stay on that dose until you see your doctor.  Reasons to stop increasing the dose: Reason 1: You get good relief of symptoms, in which case there is no need to increase the daily dose any further.  Reason 2: You develop some side effects, such as sleeping all of the time, difficulty concentrating, or becoming disoriented, in  which case you need to go down on the dose, to the prior level, where you were not experiencing any side effects. Stay on that dose longer, to allow more time for your body to get use it, before attempting to increase it again.  Endpoint: Once you have reached the maximum dose you can tolerate without side-effects, contact your physician so as to evaluate the results of the regimen.   Questions: Feel free to contact us for any questions or problems at [phone] ____________________________________________________________________________________________ Rx for oxycodone hcl 10 mg x 2 months to begin filling 01/23/18 escribed to pharmacy   Celebrex, nexium and Gabapentin escribed to pharmacy

## 2018-01-24 ENCOUNTER — Telehealth: Payer: Self-pay

## 2018-01-24 NOTE — Telephone Encounter (Signed)
Post procedure phone call.  LM 

## 2018-02-05 NOTE — Progress Notes (Signed)
Patient's Name: Lance Roman  MRN: [id number]  Referring Provider: Hoyt Koch, *  DOB: Aug 23, 1960  PCP: Hoyt Koch, MD  DOS: 02/07/2018  Note by: Gaspar Cola, MD  Service setting: Ambulatory outpatient  Specialty: Interventional Pain Management  Location: [facility] (AMB) Pain Management Facility    Patient type: Established   Primary Reason(s) for Visit: Encounter for post-procedure evaluation of chronic illness with mild to moderate exacerbation CC: Back Pain  Procedure:          Anesthesia, Analgesia, Anxiolysis:  Type: Trigger Point Injection (1-2 muscle groups) #1  CPT: 20552 Primary Purpose: Diagnostic Region: Posterior Back Level: Thoracolumbar & PSIS Target Area: Trigger Point Approach: Percutaneous, ipsilateral approach. Laterality: Left-Sided        Type: Local Anesthesia Indication(s): Analgesia         Local Anesthetic: Lidocaine 1-2% Route: Infiltration ([place]/IM) IV Access: Declined Sedation: Declined   Position: Prone   Indications: 1. Chronic low back pain (Primary Area of Pain) (Bilateral) (L>R)   2. Trigger point with back pain    Pain Score: Pre-procedure: 4 /10 Post-procedure: 0-No pain/10  HPI  Lance Roman is a 57 y.o. year old, male patient, who comes today for a post-procedure evaluation. He has GERD; Chronic low back pain (Primary Area of Pain) (Bilateral) (L>R); Pharmacologic therapy; Infected sebaceous cyst; Disorder of skeletal system; Problems influencing health status; Opiate use (150 MME/day); Long term prescription opiate use; Chronic pain syndrome; Muscle cramps; Long-term current use of opiate analgesic; Chronic lower extremity pain (Secondary Area of Pain) (Left); Chronic lumbar radicular pain (L5) (Left); Retrolisthesis (4-5 mm) (L3 over L4) (L2 over L3; Failed back surgical syndrome (Fusion L4-S1); Lumbar facet hypertrophy (Bilateral); Lumbar facet syndrome (Bilateral) (L>R); Spinal cord stimulator dysfunction (fractured  leads); Lumbar facet osteoarthritis (Bilateral); Osteoarthritis of lumbar spine; Spondylosis without myelopathy or radiculopathy, lumbosacral region; Other specified dorsopathies, sacral and sacrococcygeal region; Chronic hip pain (Left); Chronic knee pain (Left); Other intervertebral disc degeneration, lumbar region; Neurogenic pain; Acute postoperative pain; and Trigger point with back pain on their problem list. His primarily concern today is the Back Pain  Pain Assessment: Location: Lower Back Radiating: pain radiaties down left leg and sometimes down my foot Onset: More than a month ago Duration: Chronic pain Quality: Aching Severity: 4 /10 (subjective, self-reported pain score)  Note: Reported level is inconsistent with clinical observations. Clinically the patient looks like a 2/10 A 2/10 is viewed as "Mild to Moderate" and described as noticeable and distracting. Impossible to hide from other people. More frequent flare-ups. Still possible to adapt and function close to normal. It can be very annoying and may have occasional stronger flare-ups. With discipline, patients may get used to it and adapt. Lance Roman continues to use a standard subjective pain scale, rather than an objective pain scale as instructed. When using our objective Pain Scale, levels between 6 and 10/10 are said to belong in an emergency room, as it progressively worsens from a 6/10, described as severely limiting, requiring emergency care not usually available at an outpatient pain management facility. At a 6/10 level, communication becomes difficult and requires great effort. Assistance to reach the emergency department may be required. Facial flushing and profuse sweating along with potentially dangerous increases in heart rate and blood pressure will be evident. Effect on ADL: slow me down Timing: Constant Modifying factors: medications, and moving BP: (!) 147/86  HR: 76  Lance Roman comes in today for post-procedure  evaluation.  Further details on  both, my assessment(s), as well as the proposed treatment plan, please see below.  Post-Procedure Assessment  01/23/2018 Procedure: Therapeutic right-sided lumbar facet radiofrequency ablation #1 under fluoroscopic guidance and IV sedation (Right done on 01/23/18; Left done on 11/21/17) Pre-procedure pain score:  3/10 Post-procedure pain score: 0/10 (100% relief) Influential Factors: BMI: 29.53 kg/m Intra-procedural challenges: None observed.         Assessment challenges: None detected.              Reported side-effects: None.        Post-procedural adverse reactions or complications: None reported         Sedation: Sedation provided. When no sedatives are used, the analgesic levels obtained are directly associated to the effectiveness of the local anesthetics. However, when sedation is provided, the level of analgesia obtained during the initial 1 hour following the intervention, is believed to be the result of a combination of factors. These factors may include, but are not limited to: 1. The effectiveness of the local anesthetics used. 2. The effects of the analgesic(s) and/or anxiolytic(s) used. 3. The degree of discomfort experienced by the patient at the time of the procedure. 4. The patients ability and reliability in recalling and recording the events. 5. The presence and influence of possible secondary gains and/or psychosocial factors. Reported result: Relief experienced during the 1st hour after the procedure: 100 % (Ultra-Short Term Relief) Lance Roman has indicated area to have been numb during this time. Interpretative annotation: Clinically appropriate result. Analgesia during this period is likely to be Local Anesthetic and/or IV Sedative (Analgesic/Anxiolytic) related.          Effects of local anesthetic: The analgesic effects attained during this period are directly associated to the localized infiltration of local anesthetics and therefore  cary significant diagnostic value as to the etiological location, or anatomical origin, of the pain. Expected duration of relief is directly dependent on the pharmacodynamics of the local anesthetic used. Long-acting (4-6 hours) anesthetics used.  Reported result: Relief during the next 4 to 6 hour after the procedure: 100 % (Short-Term Relief) Mr. Leaming has indicated area to have been numb during this time. Interpretative annotation: Clinically appropriate result. Analgesia during this period is likely to be Local Anesthetic-related.          Long-term benefit: Defined as the period of time past the expected duration of local anesthetics (1 hour for short-acting and 4-6 hours for long-acting). With the possible exception of prolonged sympathetic blockade from the local anesthetics, benefits during this period are typically attributed to, or associated with, other factors such as analgesic sensory neuropraxia, antiinflammatory effects, or beneficial biochemical changes provided by agents other than the local anesthetics.  Reported result: Extended relief following procedure: 100 %(for 2 days) (Long-Term Relief)            Interpretative annotation: Clinically possible results. Good relief. No permanent benefit expected. Inflammation plays a part in the etiology to the pain.          Current benefits: Defined as reported results that persistent at this point in time.   Analgesia: 75 % Mr. Clift reports improvement of axial symptoms.  However, he has been experiencing some sharp pains secondary to trigger points, which we have identified today during the physical exam. Function: Somewhat improved ROM: Somewhat improved Interpretative annotation: Ongoing benefit. Therapeutic benefit observed. Adequate RF ablation.          Interpretation: Results would suggest a successful palliative intervention.  Plan:  Trigger point injection today to deactivate the areas of muscle spasm and flareup  after the radiofrequency.                Laboratory Chemistry  Inflammation Markers (CRP: Acute Phase) (ESR: Chronic Phase) Lab Results  Component Value Date   CRP 2.5 12/12/2016   ESRSEDRATE 10 12/12/2016                         Rheumatology Markers Lab Results  Component Value Date   LABURIC 6.4 08/11/2008                        Renal Markers Lab Results  Component Value Date   BUN 20 12/12/2016   CREATININE 0.74 (L) 12/12/2016   BCR 27 (H) 12/12/2016   GFRAA 120 12/12/2016   GFRNONAA 104 12/12/2016                             Hepatic Markers Lab Results  Component Value Date   AST 21 12/12/2016   ALT 12 12/30/2014   ALBUMIN 4.5 12/12/2016   HCVAB NEGATIVE 12/30/2014                        Neuropathy Markers Lab Results  Component Value Date   VITAMINB12 632 12/12/2016                        Hematology Parameters Lab Results  Component Value Date   PLT 167.0 03/03/2011   HGB 13.1 03/03/2011   HCT 38.5 (L) 03/03/2011                        CV Markers No results found.  Note: Lab results reviewed.  Recent Imaging Results   Results for orders placed in visit on 01/23/18  DG C-Arm 1-60 Min-No Report   Narrative Fluoroscopy was utilized by the requesting physician.  No radiographic  interpretation.    Interpretation Report: Fluoroscopy was used during the procedure to assist with needle guidance. The images were interpreted intraoperatively by the requesting physician.  Meds   Current Outpatient Medications:  .  celecoxib (CELEBREX) 200 MG capsule, Take 1 capsule (200 mg total) by mouth 2 (two) times daily., Disp: 180 capsule, Rfl: 1 .  esomeprazole (NEXIUM) 40 MG capsule, Take 1 capsule (40 mg total) by mouth 2 (two) times daily before a meal., Disp: 60 capsule, Rfl: 5 .  gabapentin (NEURONTIN) 100 MG capsule, Take 1-3 capsules (100-300 mg total) by mouth at bedtime. Follow written titration schedule., Disp: 90 capsule, Rfl: 2 .  ibuprofen  (ADVIL,MOTRIN) 200 MG tablet, Take 400 mg by mouth every 6 (six) hours as needed for moderate pain. , Disp: , Rfl:  .  olopatadine (PATANOL) 0.1 % ophthalmic solution, Place 1 drop into both eyes as needed for allergies. , Disp: , Rfl:  .  Oxycodone HCl 10 MG TABS, Take 1 tablet (10 mg total) by mouth every 6 (six) hours as needed. Must last 30 days., Disp: 120 tablet, Rfl: 0 .  [START ON 02/22/2018] Oxycodone HCl 10 MG TABS, Take 1 tablet (10 mg total) by mouth every 6 (six) hours as needed. Must last 30 days., Disp: 120 tablet, Rfl: 0  ROS  Constitutional: Denies any fever or chills Gastrointestinal: No reported hemesis, hematochezia,  vomiting, or acute GI distress Musculoskeletal: Denies any acute onset joint swelling, redness, loss of ROM, or weakness Neurological: No reported episodes of acute onset apraxia, aphasia, dysarthria, agnosia, amnesia, paralysis, loss of coordination, or loss of consciousness  Allergies  Mr. Dowdy has No Known Allergies.  [place]  Drug: Mr. Madariaga  reports that he does not use drugs. Alcohol:  reports that he drinks about 3.0 standard drinks of alcohol per week. Tobacco:  reports that he has quit smoking. His smokeless tobacco use includes chew. Medical:  has a past medical history of Allergic rhinitis, Chronic pain syndrome, DDD (degenerative disc disease), lumbosacral, GERD (gastroesophageal reflux disease), Hyperlipidemia, Osteoarthritis, and S/P appendectomy. Surgical: Mr. Goodie  has a past surgical history that includes Appendectomy; repairs of bilateral ankle fractures; Spinal fusion (N/A); and Spinal cord stimulator implant (N/A). Family: family history includes Diabetes in his maternal grandmother; Heart disease in his father.  Constitutional Exam  General appearance: Well nourished, well developed, and well hydrated. In no apparent acute distress Vitals:   02/07/18 1344 02/07/18 1448  BP: 134/83 (!) 147/86  Pulse: 81 76  Resp:  18  Temp: 98.1 F  (36.7 C)   SpO2: 100% 100%  Weight: 200 lb (90.7 kg)   Height: 5' 9"  (1.753 m)    BMI Assessment: Estimated body mass index is 29.53 kg/m as calculated from the following:   Height as of this encounter: 5' 9"  (1.753 m).   Weight as of this encounter: 200 lb (90.7 kg).  BMI interpretation table: BMI level Category Range association with higher incidence of chronic pain  <18 kg/m2 Underweight   18.5-24.9 kg/m2 Ideal body weight   25-29.9 kg/m2 Overweight Increased incidence by 20%  30-34.9 kg/m2 Obese (Class I) Increased incidence by 68%  35-39.9 kg/m2 Severe obesity (Class II) Increased incidence by 136%  >40 kg/m2 Extreme obesity (Class III) Increased incidence by 254%   Patient's current BMI Ideal Body weight  Body mass index is 29.53 kg/m. Ideal body weight: 70.7 kg (155 lb 13.8 oz) Adjusted ideal body weight: 78.7 kg (173 lb 8.3 oz)   BMI Readings from Last 4 Encounters:  02/07/18 29.53 kg/m  01/23/18 27.26 kg/m  11/21/17 27.26 kg/m  10/18/17 27.26 kg/m   Wt Readings from Last 4 Encounters:  02/07/18 200 lb (90.7 kg)  01/23/18 190 lb (86.2 kg)  11/21/17 190 lb (86.2 kg)  10/18/17 190 lb (86.2 kg)  Psych/Mental status: Alert, oriented x 3 (person, place, & time)       Eyes: PERLA Respiratory: No evidence of acute respiratory distress  Cervical Spine Area Exam  Skin & Axial Inspection: No masses, redness, edema, swelling, or associated skin lesions Alignment: Symmetrical Functional ROM: Unrestricted ROM      Stability: No instability detected Muscle Tone/Strength: Functionally intact. No obvious neuro-muscular anomalies detected. Sensory (Neurological): Unimpaired Palpation: No palpable anomalies              Upper Extremity (UE) Exam    Side: Right upper extremity  Side: Left upper extremity  Skin & Extremity Inspection: Skin color, temperature, and hair growth are WNL. No peripheral edema or cyanosis. No masses, redness, swelling, asymmetry, or associated  skin lesions. No contractures.  Skin & Extremity Inspection: Skin color, temperature, and hair growth are WNL. No peripheral edema or cyanosis. No masses, redness, swelling, asymmetry, or associated skin lesions. No contractures.  Functional ROM: Unrestricted ROM          Functional ROM: Unrestricted ROM  Muscle Tone/Strength: Functionally intact. No obvious neuro-muscular anomalies detected.  Muscle Tone/Strength: Functionally intact. No obvious neuro-muscular anomalies detected.  Sensory (Neurological): Unimpaired          Sensory (Neurological): Unimpaired          Palpation: No palpable anomalies              Palpation: No palpable anomalies              Provocative Test(s):  Phalen's test: deferred Tinel's test: deferred Apley's scratch test (touch opposite shoulder):  Action 1 (Across chest): deferred Action 2 (Overhead): deferred Action 3 (LB reach): deferred   Provocative Test(s):  Phalen's test: deferred Tinel's test: deferred Apley's scratch test (touch opposite shoulder):  Action 1 (Across chest): deferred Action 2 (Overhead): deferred Action 3 (LB reach): deferred    Thoracic Spine Area Exam  Skin & Axial Inspection: No masses, redness, or swelling Alignment: Symmetrical Functional ROM: Unrestricted ROM Stability: No instability detected Muscle Tone/Strength: Increased muscle tone over affected area Sensory (Neurological): Musculoskeletal pain pattern Muscle strength & Tone: Trigger Point over the area of the latissimus dorsi on the left side, close to its insertion to the spine where it is overlapped by the lower end of the trapezius muscle.  Lumbar Spine Area Exam  Skin & Axial Inspection: No masses, redness, or swelling Alignment: Symmetrical Functional ROM: Decreased ROM       Stability: No instability detected Muscle Tone/Strength: Increased muscle tone over affected area Sensory (Neurological): Musculoskeletal pain pattern Palpation: Trigger Point Over  the medial aspect of his spinal cord stimulator battery, close to the PSIS on the left side at the lower border of the erector spinae muscle. Provocative Tests: Hyperextension/rotation test: Equivocal       Lumbar quadrant test (Kemp's test): deferred today       Lateral bending test: deferred today       Patrick's Maneuver: deferred today                   FABER test: deferred today                   S-I anterior distraction/compression test: deferred today         S-I lateral compression test: deferred today         S-I Thigh-thrust test: deferred today         S-I Gaenslen's test: deferred today          Gait & Posture Assessment  Ambulation: Unassisted Gait: Relatively normal for age and body habitus Posture: WNL   Lower Extremity Exam    Side: Right lower extremity  Side: Left lower extremity  Stability: No instability observed          Stability: No instability observed          Skin & Extremity Inspection: Skin color, temperature, and hair growth are WNL. No peripheral edema or cyanosis. No masses, redness, swelling, asymmetry, or associated skin lesions. No contractures.  Skin & Extremity Inspection: Skin color, temperature, and hair growth are WNL. No peripheral edema or cyanosis. No masses, redness, swelling, asymmetry, or associated skin lesions. No contractures.  Functional ROM: Unrestricted ROM                  Functional ROM: Unrestricted ROM                  Muscle Tone/Strength: Functionally intact. No obvious neuro-muscular anomalies detected.  Muscle Tone/Strength: Functionally intact.  No obvious neuro-muscular anomalies detected.  Sensory (Neurological): Unimpaired  Sensory (Neurological): Unimpaired  Palpation: No palpable anomalies  Palpation: No palpable anomalies   Assessment  Primary Diagnosis & Pertinent Problem List: The primary encounter diagnosis was Trigger point with back pain. Diagnoses of Lumbar facet syndrome (Bilateral) (L>R), Chronic low back pain  (Primary Area of Pain) (Bilateral) (L>R), and Failed back surgical syndrome (Fusion L4-S1) were also pertinent to this visit.  Status Diagnosis  Active Improving Having a Flare-up 1. Trigger point with back pain   2. Lumbar facet syndrome (Bilateral) (L>R)   3. Chronic low back pain (Primary Area of Pain) (Bilateral) (L>R)   4. Failed back surgical syndrome (Fusion L4-S1)     Problems updated and reviewed during this visit: Problem  Trigger Point With Back Pain   Pre-op Assessment:  Mr. Hagood is a 57 y.o. (year old), male patient, seen today for interventional treatment. He  has a past surgical history that includes Appendectomy; repairs of bilateral ankle fractures; Spinal fusion (N/A); and Spinal cord stimulator implant (N/A). Mr. Stripling has a current medication list which includes the following prescription(s): celecoxib, esomeprazole, gabapentin, ibuprofen, olopatadine, oxycodone hcl, and oxycodone hcl. His primarily concern today is the Back Pain  Initial Vital Signs:  Pulse/HCG Rate: 81  Temp: 98.1 F (36.7 C) Resp: 18 BP: 134/83 SpO2: 100 %  BMI: Estimated body mass index is 29.53 kg/m as calculated from the following:   Height as of this encounter: 5' 9"  (1.753 m).   Weight as of this encounter: 200 lb (90.7 kg).  Risk Assessment: Allergies: Reviewed. He has No Known Allergies.  Allergy Precautions: None required Coagulopathies: Reviewed. None identified.  Blood-thinner therapy: None at this time Active Infection(s): Reviewed. None identified. Mr. Mccarthy is afebrile  Site Confirmation: Mr. Schmader was asked to confirm the procedure and laterality before marking the site Procedure checklist: Completed Consent: Before the procedure and under the influence of no sedative(s), amnesic(s), or anxiolytics, the patient was informed of the treatment options, risks and possible complications. To fulfill our ethical and legal obligations, as recommended by the American Medical  Association's Code of Ethics, I have informed the patient of my clinical impression; the nature and purpose of the treatment or procedure; the risks, benefits, and possible complications of the intervention; the alternatives, including doing nothing; the risk(s) and benefit(s) of the alternative treatment(s) or procedure(s); and the risk(s) and benefit(s) of doing nothing. The patient was provided information about the general risks and possible complications associated with the procedure. These may include, but are not limited to: failure to achieve desired goals, infection, bleeding, organ or nerve damage, allergic reactions, paralysis, and death. In addition, the patient was informed of those risks and complications associated to the procedure, such as failure to decrease pain; infection; bleeding; organ or nerve damage with subsequent damage to sensory, motor, and/or autonomic systems, resulting in permanent pain, numbness, and/or weakness of one or several areas of the body; allergic reactions; (i.e.: anaphylactic reaction); and/or death. Furthermore, the patient was informed of those risks and complications associated with the medications. These include, but are not limited to: allergic reactions (i.e.: anaphylactic or anaphylactoid reaction(s)); adrenal axis suppression; blood sugar elevation that in diabetics may result in ketoacidosis or comma; water retention that in patients with history of congestive heart failure may result in shortness of breath, pulmonary edema, and decompensation with resultant heart failure; weight gain; swelling or edema; medication-induced neural toxicity; particulate matter embolism and blood vessel occlusion with resultant  organ, and/or nervous system infarction; and/or aseptic necrosis of one or more joints. Finally, the patient was informed that Medicine is not an exact science; therefore, there is also the possibility of unforeseen or unpredictable risks and/or possible  complications that may result in a catastrophic outcome. The patient indicated having understood very clearly. We have given the patient no guarantees and we have made no promises. Enough time was given to the patient to ask questions, all of which were answered to the patient's satisfaction. Mr. Fults has indicated that he wanted to continue with the procedure. Attestation: I, the ordering provider, attest that I have discussed with the patient the benefits, risks, side-effects, alternatives, likelihood of achieving goals, and potential problems during recovery for the procedure that I have provided informed consent. Date  Time: 02/07/2018  1:47 PM  Pre-Procedure Preparation:  Monitoring: As per clinic protocol. Respiration, ETCO2, SpO2, BP, heart rate and rhythm monitor placed and checked for adequate function Safety Precautions: Patient was assessed for positional comfort and pressure points before starting the procedure. Time-out: I initiated and conducted the "Time-out" before starting the procedure, as per protocol. The patient was asked to participate by confirming the accuracy of the "Time Out" information. Verification of the correct person, site, and procedure were performed and confirmed by me, the nursing staff, and the patient. "Time-out" conducted as per Joint Commission's Universal Protocol (UP.01.01.01). Time: 1444  Description of Procedure:          Area Prepped: Entire Posterior Back Region Prepping solution: ChloraPrep (2% chlorhexidine gluconate and 70% isopropyl alcohol) Safety Precautions: Aspiration looking for blood return was conducted prior to all injections. At no point did we inject any substances, as a needle was being advanced. No attempts were made at seeking any paresthesias. Safe injection practices and needle disposal techniques used. Medications properly checked for expiration dates. SDV (single dose vial) medications used. Description of the Procedure: Protocol  guidelines were followed. The patient was placed in position over the fluoroscopy table. The target area was identified and the area prepped in the usual manner. Skin & deeper tissues infiltrated with local anesthetic. Appropriate amount of time allowed to pass for local anesthetics to take effect. The procedure needles were then advanced to the target area. Proper needle placement secured. Negative aspiration confirmed. Solution injected in intermittent fashion, asking for systemic symptoms every 0.5cc of injectate. The needles were then removed and the area cleansed, making sure to leave some of the prepping solution back to take advantage of its long term bactericidal properties.  Vitals:   02/07/18 1344 02/07/18 1448  BP: 134/83 (!) 147/86  Pulse: 81 76  Resp:  18  Temp: 98.1 F (36.7 C)   SpO2: 100% 100%  Weight: 200 lb (90.7 kg)   Height: 5' 9"  (1.753 m)     Start Time: 1444 hrs. End Time: 1447 hrs. Materials:  Needle(s) Type: Epidural needle Gauge: 25G Length: 1.5-in Medication(s): Please see orders for medications and dosing details.  Imaging Guidance:          Type of Imaging Technique: None used Indication(s): N/A Exposure Time: No patient exposure Contrast: None used. Fluoroscopic Guidance: N/A Ultrasound Guidance: N/A Interpretation: N/A  Antibiotic Prophylaxis:   Anti-infectives (From admission, onward)   None     Indication(s): None identified  Post-operative Assessment:  Post-procedure Vital Signs:  Pulse/HCG Rate: 76  Temp: 98.1 F (36.7 C) Resp: 18 BP: (!) 147/86 SpO2: 100 %  EBL: None  Complications: No immediate post-treatment complications  observed by team, or reported by patient.  Note: The patient tolerated the entire procedure well. A repeat set of vitals were taken after the procedure and the patient was kept under observation following institutional policy, for this type of procedure. Post-procedural neurological assessment was performed,  showing return to baseline, prior to discharge. The patient was provided with post-procedure discharge instructions, including a section on how to identify potential problems. Should any problems arise concerning this procedure, the patient was given instructions to immediately contact us, at any time, without hesitation. In any case, we plan to contact the patient by telephone for a follow-up status report regarding this interventional procedure.  Comments:  No additional relevant information.  Plan of Care  Pharmacotherapy (Medications Ordered): Meds ordered this encounter  Medications  . triamcinolone acetonide (KENALOG-40) injection 40 mg  . ropivacaine (PF) 2 mg/mL (0.2%) (NAROPIN) injection 9 mL   Medications administered today: We administered triamcinolone acetonide and ropivacaine (PF) 2 mg/mL (0.2%).   Procedure Orders     TRIGGER POINT INJECTION Lab Orders  No laboratory test(s) ordered today   Imaging Orders  No imaging studies ordered today   Referral Orders  No referral(s) requested today   Interventional management options: Planned, scheduled, and/or pending:   Diagnostic/therapeutic trigger point injection #1 of the mid back and lower back area on the left side (today). Therapeutic Revision and replacement of spinal cord stimulator with MRI compatible device. 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   Considering:   Removal of nonfunctional spinal cord stimulatorfollowed by an MRI of the lumbar spine with and without contrast. Diagnostic bilateral lumbar facet block Possible bilateral lumbar facet RFA Diagnostic caudal epidural steroid injection plus diagnostic epidurogram Possible Racz procedure   Palliative PRN treatment(s):   Therapeutic bilateral lumbar facet RFA #2 (Right done on 01/23/18; Left done on 11/21/17)    Provider-requested follow-up: Return for Med-Mgmt, w/ Dionisio David, NP before 03/24/18.  Currently we are refilling his pain medication with an interval of 60 days, but should he continue doing well in terms of following our medication guidelines, then we will consider going to 90 days.  Future Appointments  Date Time Provider [place]  03/15/2018  8:45 AM Vevelyn Francois, NP [facility] None   Primary Care Physician: Hoyt Koch, MD Location: [facility] Note by: Gaspar Cola, MD Date: 02/07/2018; Time: 3:07 PM

## 2018-02-07 ENCOUNTER — Other Ambulatory Visit: Payer: Self-pay

## 2018-02-07 ENCOUNTER — Ambulatory Visit: Payer: No Typology Code available for payment source | Attending: Pain Medicine | Admitting: Pain Medicine

## 2018-02-07 ENCOUNTER — Encounter: Payer: Self-pay | Admitting: Pain Medicine

## 2018-02-07 VITALS — BP 147/86 | HR 76 | Temp 98.1°F | Resp 18 | Ht 69.0 in | Wt 200.0 lb

## 2018-02-07 DIAGNOSIS — L723 Sebaceous cyst: Secondary | ICD-10-CM | POA: Insufficient documentation

## 2018-02-07 DIAGNOSIS — G894 Chronic pain syndrome: Secondary | ICD-10-CM | POA: Diagnosis not present

## 2018-02-07 DIAGNOSIS — Z79899 Other long term (current) drug therapy: Secondary | ICD-10-CM | POA: Diagnosis not present

## 2018-02-07 DIAGNOSIS — M549 Dorsalgia, unspecified: Secondary | ICD-10-CM | POA: Diagnosis not present

## 2018-02-07 DIAGNOSIS — M545 Low back pain: Secondary | ICD-10-CM | POA: Diagnosis present

## 2018-02-07 DIAGNOSIS — E785 Hyperlipidemia, unspecified: Secondary | ICD-10-CM | POA: Insufficient documentation

## 2018-02-07 DIAGNOSIS — J309 Allergic rhinitis, unspecified: Secondary | ICD-10-CM | POA: Insufficient documentation

## 2018-02-07 DIAGNOSIS — M47816 Spondylosis without myelopathy or radiculopathy, lumbar region: Secondary | ICD-10-CM | POA: Diagnosis not present

## 2018-02-07 DIAGNOSIS — Z87891 Personal history of nicotine dependence: Secondary | ICD-10-CM | POA: Insufficient documentation

## 2018-02-07 DIAGNOSIS — Z981 Arthrodesis status: Secondary | ICD-10-CM | POA: Diagnosis not present

## 2018-02-07 DIAGNOSIS — R252 Cramp and spasm: Secondary | ICD-10-CM | POA: Insufficient documentation

## 2018-02-07 DIAGNOSIS — Z8249 Family history of ischemic heart disease and other diseases of the circulatory system: Secondary | ICD-10-CM | POA: Insufficient documentation

## 2018-02-07 DIAGNOSIS — M5442 Lumbago with sciatica, left side: Secondary | ICD-10-CM

## 2018-02-07 DIAGNOSIS — M961 Postlaminectomy syndrome, not elsewhere classified: Secondary | ICD-10-CM | POA: Diagnosis not present

## 2018-02-07 DIAGNOSIS — Z79891 Long term (current) use of opiate analgesic: Secondary | ICD-10-CM | POA: Diagnosis not present

## 2018-02-07 DIAGNOSIS — G8929 Other chronic pain: Secondary | ICD-10-CM

## 2018-02-07 DIAGNOSIS — M5137 Other intervertebral disc degeneration, lumbosacral region: Secondary | ICD-10-CM | POA: Diagnosis not present

## 2018-02-07 DIAGNOSIS — K219 Gastro-esophageal reflux disease without esophagitis: Secondary | ICD-10-CM | POA: Insufficient documentation

## 2018-02-07 MED ORDER — TRIAMCINOLONE ACETONIDE 40 MG/ML IJ SUSP
40.0000 mg | Freq: Once | INTRAMUSCULAR | Status: AC
  Administered 2018-02-07: 40 mg
  Filled 2018-02-07: qty 1

## 2018-02-07 MED ORDER — ROPIVACAINE HCL 2 MG/ML IJ SOLN
9.0000 mL | Freq: Once | INTRAMUSCULAR | Status: AC
  Administered 2018-02-07: 10 mL
  Filled 2018-02-07: qty 10

## 2018-02-07 NOTE — Progress Notes (Signed)
Nursing Pain Medication Assessment:  Safety precautions to be maintained throughout the outpatient stay will include: orient to surroundings, keep bed in low position, maintain call bell within reach at all times, provide assistance with transfer out of bed and ambulation.  Medication Inspection Compliance: Pill count conducted under aseptic conditions, in front of the patient. Neither the pills nor the bottle was removed from the patient's sight at any time. Once count was completed pills were immediately returned to the patient in their original bottle.  Medication: Oxycodone IR Pill/Patch Count: 59 of 120 pills remain Pill/Patch Appearance: Markings consistent with prescribed medication Bottle Appearance: Standard pharmacy container. Clearly labeled. Filled Date: 10 / 29 / 2019 Last Medication intake:  Today

## 2018-02-07 NOTE — Patient Instructions (Addendum)
____________________________________________________________________________________________  Post-Procedure Discharge Instructions  Instructions:  Apply ice: Fill a plastic sandwich bag with crushed ice. Cover it with a small towel and apply to injection site. Apply for 15 minutes then remove x 15 minutes. Repeat sequence on day of procedure, until you go to bed. The purpose is to minimize swelling and discomfort after procedure.  Apply heat: Apply heat to procedure site starting the day following the procedure. The purpose is to treat any soreness and discomfort from the procedure.  Food intake: Start with clear liquids (like water) and advance to regular food, as tolerated.   Physical activities: Keep activities to a minimum for the first 8 hours after the procedure.   Driving: If you have received any sedation, you are not allowed to drive for 24 hours after your procedure.  Blood thinner: Restart your blood thinner 6 hours after your procedure. (Only for those taking blood thinners)  Insulin: As soon as you can eat, you may resume your normal dosing schedule. (Only for those taking insulin)  Infection prevention: Keep procedure site clean and dry.  Post-procedure Pain Diary: Extremely important that this be done correctly and accurately. Recorded information will be used to determine the next step in treatment.  Pain evaluated is that of treated area only. Do not include pain from an untreated area.  Complete every hour, on the hour, for the initial 8 hours. Set an alarm to help you do this part accurately.  Do not go to sleep and have it completed later. It will not be accurate.  Follow-up appointment: Keep your follow-up appointment after the procedure. Usually 2 weeks for most procedures. (6 weeks in the case of radiofrequency.) Bring you pain diary.   Expect:  From numbing medicine (AKA: Local Anesthetics): Numbness or decrease in pain.  Onset: Full effect within 15  minutes of injected.  Duration: It will depend on the type of local anesthetic used. On the average, 1 to 8 hours.   From steroids: Decrease in swelling or inflammation. Once inflammation is improved, relief of the pain will follow.  Onset of benefits: Depends on the amount of swelling present. The more swelling, the longer it will take for the benefits to be seen. In some cases, up to 10 days.  Duration: Steroids will stay in the system x 2 weeks. Duration of benefits will depend on multiple posibilities including persistent irritating factors.  Occasional side-effects: Facial flushing (red, warm cheeks) , cramps (if present, drink Gatorade and take over-the-counter Magnesium 450-500 mg once to twice a day).  From procedure: Some discomfort is to be expected once the numbing medicine wears off. This should be minimal if ice and heat are applied as instructed.  Call if:  You experience numbness and weakness that gets worse with time, as opposed to wearing off.  New onset bowel or bladder incontinence. (This applies to Spinal procedures only)  Emergency Numbers:  Durning business hours (Monday - Thursday, 8:00 AM - 4:00 PM) (Friday, 9:00 AM - 12:00 Noon): [phone]  After hours: [phone] ____________________________________________________________________________________________   ____________________________________________________________________________________________  Muscle Spasms & Cramps  Cause:  The most common cause of muscle spasms and cramps is vitamin and/or electrolyte (calcium, potassium, sodium, etc.) deficiencies.  Possible triggers: Sweating - causes loss of electrolytes thru the skin. Steroids - causes loss of electrolytes thru the urine.  Treatment: 1. Gatorade (or any other electrolyte-replenishing drink) - Take 1, 8 oz glass with each meal (3 times a day). 2. OTC (over-the-counter) Magnesium  400 to 500 mg - Take 1 tablet twice a day (one with  breakfast and one before bedtime). If you have kidney problems, talk to your primary care physician before taking any Magnesium. 3. Tonic Water with quinine - Take 1, 8 oz glass before bedtime.   ____________________________________________________________________________________________

## 2018-02-26 ENCOUNTER — Other Ambulatory Visit: Payer: Self-pay | Admitting: Pain Medicine

## 2018-02-26 DIAGNOSIS — M47816 Spondylosis without myelopathy or radiculopathy, lumbar region: Secondary | ICD-10-CM

## 2018-03-15 ENCOUNTER — Ambulatory Visit: Payer: No Typology Code available for payment source | Attending: Nurse Practitioner | Admitting: Nurse Practitioner

## 2018-03-15 ENCOUNTER — Encounter: Payer: Self-pay | Admitting: Nurse Practitioner

## 2018-03-15 ENCOUNTER — Other Ambulatory Visit: Payer: Self-pay

## 2018-03-15 VITALS — BP 133/75 | HR 64 | Temp 98.6°F | Ht 70.0 in | Wt 200.0 lb

## 2018-03-15 DIAGNOSIS — M549 Dorsalgia, unspecified: Secondary | ICD-10-CM | POA: Diagnosis not present

## 2018-03-15 DIAGNOSIS — Z79899 Other long term (current) drug therapy: Secondary | ICD-10-CM | POA: Insufficient documentation

## 2018-03-15 DIAGNOSIS — G8929 Other chronic pain: Secondary | ICD-10-CM | POA: Insufficient documentation

## 2018-03-15 DIAGNOSIS — M47816 Spondylosis without myelopathy or radiculopathy, lumbar region: Secondary | ICD-10-CM | POA: Insufficient documentation

## 2018-03-15 DIAGNOSIS — G894 Chronic pain syndrome: Secondary | ICD-10-CM | POA: Insufficient documentation

## 2018-03-15 DIAGNOSIS — M5136 Other intervertebral disc degeneration, lumbar region: Secondary | ICD-10-CM | POA: Insufficient documentation

## 2018-03-15 DIAGNOSIS — M5137 Other intervertebral disc degeneration, lumbosacral region: Secondary | ICD-10-CM | POA: Diagnosis not present

## 2018-03-15 DIAGNOSIS — K219 Gastro-esophageal reflux disease without esophagitis: Secondary | ICD-10-CM | POA: Diagnosis not present

## 2018-03-15 DIAGNOSIS — Z87891 Personal history of nicotine dependence: Secondary | ICD-10-CM | POA: Diagnosis not present

## 2018-03-15 DIAGNOSIS — Z79891 Long term (current) use of opiate analgesic: Secondary | ICD-10-CM | POA: Insufficient documentation

## 2018-03-15 DIAGNOSIS — Z833 Family history of diabetes mellitus: Secondary | ICD-10-CM | POA: Diagnosis not present

## 2018-03-15 MED ORDER — OXYCODONE HCL 10 MG PO TABS: 10.0000 mg | ORAL_TABLET | Freq: Four times a day (QID) | ORAL | 0 refills | Status: DC | PRN

## 2018-03-15 MED ORDER — CELECOXIB 200 MG PO CAPS: 200.0000 mg | ORAL_CAPSULE | Freq: Two times a day (BID) | ORAL | 1 refills | Status: DC

## 2018-03-15 NOTE — Patient Instructions (Signed)
____________________________________________________________________________________________  Medication Rules  Purpose: To inform patients, and their family members, of our rules and regulations.  Applies to: All patients receiving prescriptions (written or electronic).  Pharmacy of record: Pharmacy where electronic prescriptions will be sent. If written prescriptions are taken to a different pharmacy, please inform the nursing staff. The pharmacy listed in the electronic medical record should be the one where you would like electronic prescriptions to be sent.  Electronic prescriptions: In compliance with the [place]reng[place] Opioid Misuse Prevention (STOP) Act of 2017 (Session Law 2017-74/H243), effective March 28, 2018, all controlled substances must be electronically prescribed. Calling prescriptions to the pharmacy will cease to exist.  Prescription refills: Only during scheduled appointments. Applies to all prescriptions.  NOTE: The following applies primarily to controlled substances (Opioid* Pain Medications).   Patient's responsibilities: 1. Pain Pills: Bring all pain pills to every appointment (except for procedure appointments). 2. Pill Bottles: Bring pills in original pharmacy bottle. Always bring the newest bottle. Bring bottle, even if empty. 3. Medication refills: You are responsible for knowing and keeping track of what medications you take and those you need refilled. The day before your appointment: write a list of all prescriptions that need to be refilled. The day of the appointment: give the list to the admitting nurse. Prescriptions will be written only during appointments. If you forget a medication: it will not be "Called in", "Faxed", or "electronically sent". You will need to get another appointment to get these prescribed. No early refills. Do not call asking to have your prescription filled early. 4. Prescription Accuracy: You are responsible for  carefully inspecting your prescriptions before leaving our office. Have the discharge nurse carefully go over each prescription with you, before taking them home. Make sure that your name is accurately spelled, that your address is correct. Check the name and dose of your medication to make sure it is accurate. Check the number of pills, and the written instructions to make sure they are clear and accurate. Make sure that you are given enough medication to last until your next medication refill appointment. 5. Taking Medication: Take medication as prescribed. When it comes to controlled substances, taking less pills or less frequently than prescribed is permitted and encouraged. Never take more pills than instructed. Never take medication more frequently than prescribed.  6. Inform other Doctors: Always inform, all of your healthcare providers, of all the medications you take. 7. Pain Medication from other Providers: You are not allowed to accept any additional pain medication from any other Doctor or Healthcare provider. There are two exceptions to this rule. (see below) In the event that you require additional pain medication, you are responsible for notifying us, as stated below. 8. Medication Agreement: You are responsible for carefully reading and following our Medication Agreement. This must be signed before receiving any prescriptions from our practice. Safely store a copy of your signed Agreement. Violations to the Agreement will result in no further prescriptions. (Additional copies of our Medication Agreement are available upon request.) 9. Laws, Rules, & Regulations: All patients are expected to follow all Federal and State Laws, Statutes, Rules, & Regulations. Ignorance of the Laws does not constitute a valid excuse. The use of any illegal substances is prohibited. 10. Adopted CDC guidelines & recommendations: Target dosing levels will be at or below 60 MME/day. Use of benzodiazepines** is not  recommended.  Exceptions: There are only two exceptions to the rule of not receiving pain medications from other Healthcare Providers. 1.   Exception #1 (Emergencies): In the event of an emergency (i.e.: accident requiring emergency care), you are allowed to receive additional pain medication. However, you are responsible for: As soon as you are able, call our office [phone], at any time of the day or night, and leave a message stating your name, the date and nature of the emergency, and the name and dose of the medication prescribed. In the event that your call is answered by a member of our staff, make sure to document and save the date, time, and the name of the person that took your information.  2. Exception #2 (Planned Surgery): In the event that you are scheduled by another doctor or dentist to have any type of surgery or procedure, you are allowed (for a period no longer than 30 days), to receive additional pain medication, for the acute post-op pain. However, in this case, you are responsible for picking up a copy of our "Post-op Pain Management for Surgeons" handout, and giving it to your surgeon or dentist. This document is available at our office, and does not require an appointment to obtain it. Simply go to our office during business hours (Monday-Thursday from 8:00 AM to 4:00 PM) (Friday 8:00 AM to 12:00 Noon) or if you have a scheduled appointment with us, prior to your surgery, and ask for it by name. In addition, you will need to provide us with your name, name of your surgeon, type of surgery, and date of procedure or surgery.  *Opioid medications include: morphine, codeine, oxycodone, oxymorphone, hydrocodone, hydromorphone, meperidine, tramadol, tapentadol, buprenorphine, fentanyl, methadone. **Benzodiazepine medications include: diazepam (Valium), alprazolam (Xanax), clonazepam (Klonopine), lorazepam (Ativan), clorazepate (Tranxene), chlordiazepoxide (Librium), estazolam (Prosom),  oxazepam (Serax), temazepam (Restoril), triazolam (Halcion) (Last updated: 05/25/2017) ____________________________________________________________________________________________    

## 2018-03-15 NOTE — Progress Notes (Signed)
Nursing Pain Medication Assessment:  Safety precautions to be maintained throughout the outpatient stay will include: orient to surroundings, keep bed in low position, maintain call bell within reach at all times, provide assistance with transfer out of bed and ambulation.  Medication Inspection Compliance: Pill count conducted under aseptic conditions, in front of the patient. Neither the pills nor the bottle was removed from the patient's sight at any time. Once count was completed pills were immediately returned to the patient in their original bottle.  Medication: Oxycodone IR Pill/Patch Count: 38 of 120 pills remain Pill/Patch Appearance: Markings consistent with prescribed medication Bottle Appearance: Standard pharmacy container. Clearly labeled. Filled Date: 5711 / 28 / 2019 Last Medication intake:  Today

## 2018-03-15 NOTE — Progress Notes (Signed)
Patient's Name: Lance Roman  MRN: [id number]  Referring Provider: Hoyt Koch, *  DOB: [birth year]  PCP: Hoyt Koch, MD  DOS: 03/15/2018  Note by: Vevelyn Francois NP  Service setting: Ambulatory outpatient  Specialty: Interventional Pain Management  Location: [facility] (AMB) Pain Management Facility    Patient type: Established    Primary Reason(s) for Visit: Encounter for prescription drug management & post-procedure evaluation of chronic illness with mild to moderate exacerbation(Level of risk: moderate) CC: Back Pain  HPI  Lance Roman is a 57 y.o. year old, male patient, who comes today for a post-procedure evaluation and medication management. He has GERD; Chronic low back pain (Primary Area of Pain) (Bilateral) (L>R); Pharmacologic therapy; Infected sebaceous cyst; Disorder of skeletal system; Problems influencing health status; Opiate use (150 MME/day); Long term prescription opiate use; Chronic pain syndrome; Muscle cramps; Long-term current use of opiate analgesic; Chronic lower extremity pain (Secondary Area of Pain) (Left); Chronic lumbar radicular pain (L5) (Left); Retrolisthesis (4-5 mm) (L3 over L4) (L2 over L3; Failed back surgical syndrome (Fusion L4-S1); Lumbar facet hypertrophy (Bilateral); Lumbar facet syndrome (Bilateral) (L>R); Spinal cord stimulator dysfunction (fractured leads); Lumbar facet osteoarthritis (Bilateral); Osteoarthritis of lumbar spine; Spondylosis without myelopathy or radiculopathy, lumbosacral region; Other specified dorsopathies, sacral and sacrococcygeal region; Chronic hip pain (Left); Chronic knee pain (Left); Other intervertebral disc degeneration, lumbar region; Neurogenic pain; Acute postoperative pain; Trigger point with back pain; and Chronic upper back pain on their problem list. His primarily concern today is the Back Pain  Pain Assessment: Location: Medial, Upper Back Radiating: Denies Onset: More than a month ago Duration: Chronic  pain Quality: Aching, Burning Severity: 2 /10 (subjective, self-reported pain score)  Note: Reported level is compatible with observation.                          Effect on ADL: slow me down Timing: Constant Modifying factors: medications and moving BP: 133/75  HR: 64  Lance Roman was last seen on 02/26/2018 for a procedure. During today's appointment we reviewed Lance Roman's post-procedure results, as well as his outpatient medication regimen. He admits that he is having right sided upper back pain.  He admits that is worse with movement.  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  He feels like the pain sits in the middle of his back like a ball of fire.  He is status post trigger point injection of his lower back and felt like this was very effective.  Further details on both, my assessment(s), as well as the proposed treatment plan, please see below.  Controlled Substance Pharmacotherapy Assessment REMS (Risk Evaluation and Mitigation Strategy)  Analgesic: Oxycodone 10 mg 4 times daily MME/day: 60 mg/day.  [name], RN  03/15/2018  9:33 AM  Sign when Signing Visit Nursing Pain Medication Assessment:  Safety precautions to be maintained throughout the outpatient stay will include: orient to surroundings, keep bed in low position, maintain call bell within reach at all times, provide assistance with transfer out of bed and ambulation.  Medication Inspection Compliance: Pill count conducted under aseptic conditions, in front of the patient. Neither the pills nor the bottle was removed from the patient's sight at any time. Once count was completed pills were immediately returned to the patient in their original bottle.  Medication: Oxycodone IR Pill/Patch Count: 38 of 120 pills remain Pill/Patch Appearance: Markings consistent with prescribed medication Bottle Appearance: Standard pharmacy container.  Clearly labeled. Filled Date: 2 / 28 / 2019 Last Medication intake:  Today    Pharmacokinetics: Liberation and absorption (onset of action): WNL Distribution (time to peak effect): WNL Metabolism and excretion (duration of action): WNL         Pharmacodynamics: Desired effects: Analgesia: Lance Roman reports >50% benefit. Functional ability: Patient reports that medication allows him to accomplish basic ADLs Clinically meaningful improvement in function (CMIF): Sustained CMIF goals met Perceived effectiveness: Described as relatively effective, allowing for increase in activities of daily living (ADL) Undesirable effects: Side-effects or Adverse reactions: None reported Monitoring: [place] PMP: Online review of the past 45-monthperiod conducted. Compliant with practice rules and regulations Last UDS on record: Summary  Date Value Ref Range Status  07/12/2017 FINAL  Final    Comment:    ==================================================================== TOXASSURE SELECT 13 (MW) ==================================================================== Test                             Result       Flag       Units Drug Present and Declared for Prescription Verification   Morphine                       >>19509      EXPECTED   ng/mg creat   Normorphine                    1054         EXPECTED   ng/mg creat    Potential sources of large amounts of morphine in the absence of    codeine include administration of morphine or use of heroin.    Normorphine is an expected metabolite of morphine.   Hydromorphone                  240          EXPECTED   ng/mg creat    Hydromorphone may be present as a metabolite of morphine;    concentrations of hydromorphone rarely exceed 5% of the morphine    concentration when this is the source of hydromorphone. ==================================================================== Test                      Result    Flag   Units      Ref Range   Creatinine              48               mg/dL       >=20 ==================================================================== Declared Medications:  The flagging and interpretation on this report are based on the  following declared medications.  Unexpected results may arise from  inaccuracies in the declared medications.  **Note: The testing scope of this panel includes these medications:  Morphine (Morphine Sulfate)  **Note: The testing scope of this panel does not include following  reported medications:  Celecoxib  Docusate  Esomeprazole  Magnesium  Multivitamin  Olopatadine ==================================================================== For clinical consultation, please call [phone] ====================================================================    UDS interpretation: Compliant          Medication Assessment Form: Reviewed. Patient indicates being compliant with therapy Treatment compliance: Compliant Risk Assessment Profile: Aberrant behavior: See prior evaluations. None observed or detected today Comorbid factors increasing risk of overdose: See prior notes. No additional risks detected today Opioid risk tool (ORT) (Total Score): 0 Personal History of  Substance Abuse (SUD-Substance use disorder):  Alcohol: Negative  Illegal Drugs: Negative  Rx Drugs: Negative  ORT Risk Level calculation: Low Risk Risk of substance use disorder (SUD): Low Opioid Risk Tool - 03/15/18 0929      Family History of Substance Abuse   Alcohol  Negative    Illegal Drugs  Negative    Rx Drugs  Negative      Personal History of Substance Abuse   Alcohol  Negative    Illegal Drugs  Negative    Rx Drugs  Negative      Age   Age between 58-45 years   No      History of Preadolescent Sexual Abuse   History of Preadolescent Sexual Abuse  Negative or Male      Psychological Disease   Psychological Disease  Negative    Depression  Negative      Total Score   Opioid Risk Tool Scoring  0    Opioid Risk Interpretation  Low  Risk      ORT Scoring interpretation table:  Score <3 = Low Risk for SUD  Score between 4-7 = Moderate Risk for SUD  Score >8 = High Risk for Opioid Abuse   Risk Mitigation Strategies:  Patient Counseling: Covered Patient-Prescriber Agreement (PPA): Present and active  Notification to other healthcare providers: Done  Pharmacologic Plan: No change in therapy, at this time.             Post-Procedure Assessment  11/13/2019Procedure: Left-sided lumbar TPI Pre-procedure pain score:  4/10 Post-procedure pain score: 0/10         Influential Factors: BMI: 28.70 kg/m Intra-procedural challenges: None observed.         Assessment challenges: None detected.              Reported side-effects: None.        Post-procedural adverse reactions or complications: None reported         Sedation: Please see nurses note. When no sedatives are used, the analgesic levels obtained are directly associated to the effectiveness of the local anesthetics. However, when sedation is provided, the level of analgesia obtained during the initial 1 hour following the intervention, is believed to be the result of a combination of factors. These factors may include, but are not limited to: 1. The effectiveness of the local anesthetics used. 2. The effects of the analgesic(s) and/or anxiolytic(s) used. 3. The degree of discomfort experienced by the patient at the time of the procedure. 4. The patients ability and reliability in recalling and recording the events. 5. The presence and influence of possible secondary gains and/or psychosocial factors. Reported result: Relief experienced during the 1st hour after the procedure: 100 % (Ultra-Short Term Relief)            Interpretative annotation: Clinically appropriate result. Analgesia during this period is likely to be Local Anesthetic and/or IV Sedative (Analgesic/Anxiolytic) related.          Effects of local anesthetic: The analgesic effects attained during this  period are directly associated to the localized infiltration of local anesthetics and therefore cary significant diagnostic value as to the etiological location, or anatomical origin, of the pain. Expected duration of relief is directly dependent on the pharmacodynamics of the local anesthetic used. Long-acting (4-6 hours) anesthetics used.  Reported result: Relief during the next 4 to 6 hour after the procedure: 100 % (Short-Term Relief)            Interpretative annotation:  Clinically appropriate result. Analgesia during this period is likely to be Local Anesthetic-related.          Long-term benefit: Defined as the period of time past the expected duration of local anesthetics (1 hour for short-acting and 4-6 hours for long-acting). With the possible exception of prolonged sympathetic blockade from the local anesthetics, benefits during this period are typically attributed to, or associated with, other factors such as analgesic sensory neuropraxia, antiinflammatory effects, or beneficial biochemical changes provided by agents other than the local anesthetics.  Reported result: Extended relief following procedure: 100 %(5 weeks) (Long-Term Relief)            Interpretative annotation: Clinically possible results. Good relief. No permanent benefit expected. Inflammation plays a part in the etiology to the pain.          Current benefits: Defined as reported results that persistent at this point in time.   Analgesia: 50 %            Function: Somewhat improved ROM: Somewhat improved Interpretative annotation: Good relief.    Effective diagnostic intervention.          Interpretation: Results would suggest a successful diagnostic intervention.                  Plan:  Please see "Plan of Care" for details.                Laboratory Chemistry  Inflammation Markers (CRP: Acute Phase) (ESR: Chronic Phase) Lab Results  Component Value Date   CRP 2.5 12/12/2016   ESRSEDRATE 10 12/12/2016                          Rheumatology Markers Lab Results  Component Value Date   LABURIC 6.4 08/11/2008                        Renal Function Markers Lab Results  Component Value Date   BUN 20 12/12/2016   CREATININE 0.74 (L) 12/12/2016   BCR 27 (H) 12/12/2016   GFRAA 120 12/12/2016   GFRNONAA 104 12/12/2016                             Hepatic Function Markers Lab Results  Component Value Date   AST 21 12/12/2016   ALT 12 12/30/2014   ALBUMIN 4.5 12/12/2016   ALKPHOS 66 12/12/2016   HCVAB NEGATIVE 12/30/2014                        Electrolytes Lab Results  Component Value Date   NA 140 12/12/2016   K 4.5 12/12/2016   CL 99 12/12/2016   CALCIUM 9.7 12/12/2016   MG 1.9 12/12/2016                        Neuropathy Markers Lab Results  Component Value Date   MGQQPYPP50 932 12/12/2016                        CNS Tests No results found for: COLORCSF, APPEARCSF, RBCCOUNTCSF, WBCCSF, POLYSCSF, LYMPHSCSF, EOSCSF, PROTEINCSF, GLUCCSF, JCVIRUS, CSFOLI, IGGCSF                      Bone Pathology Markers Lab Results  Component Value Date   25OHVITD1 44 12/12/2016  25OHVITD2 <1.0 12/12/2016   25OHVITD3 43 12/12/2016                         Coagulation Parameters Lab Results  Component Value Date   PLT 167.0 03/03/2011                        Cardiovascular Markers Lab Results  Component Value Date   HGB 13.1 03/03/2011   HCT 38.5 (L) 03/03/2011                         CA Markers No results found for: CEA, CA125, LABCA2                      Note: Lab results reviewed.  Recent Diagnostic Imaging Results  DG C-Arm 1-60 Min-No Report Fluoroscopy was utilized by the requesting physician.  No radiographic  interpretation.   Complexity Note: Imaging results reviewed. Results shared with Mr. Gemmer, using Layman's terms.                         Meds   Current Outpatient Medications:  .  celecoxib (CELEBREX) 200 MG capsule, Take 1 capsule (200 mg total) by mouth 2  (two) times daily., Disp: 60 capsule, Rfl: 1 .  esomeprazole (NEXIUM) 40 MG capsule, Take 1 capsule (40 mg total) by mouth 2 (two) times daily before a meal., Disp: 60 capsule, Rfl: 5 .  gabapentin (NEURONTIN) 100 MG capsule, Take 1-3 capsules (100-300 mg total) by mouth at bedtime. Follow written titration schedule., Disp: 90 capsule, Rfl: 2 .  ibuprofen (ADVIL,MOTRIN) 200 MG tablet, Take 400 mg by mouth every 6 (six) hours as needed for moderate pain. , Disp: , Rfl:  .  olopatadine (PATANOL) 0.1 % ophthalmic solution, Place 1 drop into both eyes as needed for allergies. , Disp: , Rfl:  .  [START ON 04/23/2018] Oxycodone HCl 10 MG TABS, Take 1 tablet (10 mg total) by mouth every 6 (six) hours as needed. Must last 30 days., Disp: 120 tablet, Rfl: 0 .  [START ON 03/24/2018] Oxycodone HCl 10 MG TABS, Take 1 tablet (10 mg total) by mouth every 6 (six) hours as needed. Must last 30 days., Disp: 120 tablet, Rfl: 0  ROS  Constitutional: Denies any fever or chills Gastrointestinal: No reported hemesis, hematochezia, vomiting, or acute GI distress Musculoskeletal: Denies any acute onset joint swelling, redness, loss of ROM, or weakness Neurological: No reported episodes of acute onset apraxia, aphasia, dysarthria, agnosia, amnesia, paralysis, loss of coordination, or loss of consciousness  Allergies  Mr. Keetch has No Known Allergies.  PFSH  Drug: Mr. Granillo  reports no history of drug use. Alcohol:  reports current alcohol use of about 3.0 standard drinks of alcohol per week. Tobacco:  reports that he has quit smoking. His smokeless tobacco use includes chew. Medical:  has a past medical history of Allergic rhinitis, Chronic pain syndrome, DDD (degenerative disc disease), lumbosacral, GERD (gastroesophageal reflux disease), Hyperlipidemia, Osteoarthritis, and S/P appendectomy. Surgical: Mr. Leandro  has a past surgical history that includes Appendectomy; repairs of bilateral ankle fractures; Spinal  fusion (N/A); and Spinal cord stimulator implant (N/A). Family: family history includes Diabetes in his maternal grandmother; Heart disease in his father.  Constitutional Exam  General appearance: Well nourished, well developed, and well hydrated. In no apparent acute distress Vitals:   03/15/18  0925  BP: 133/75  Pulse: 64  Temp: 98.6 F (37 C)  SpO2: 99%  Weight: 200 lb (90.7 kg)  Height: 5' 10"  (1.778 m)  Psych/Mental status: Alert, oriented x 3 (person, place, & time)       Eyes: PERLA Respiratory: No evidence of acute respiratory distress  Cervical Spine Area Exam  Skin & Axial Inspection: No masses, redness, edema, swelling, or associated skin lesions Alignment: Symmetrical Functional ROM: Unrestricted ROM      Stability: No instability detected Muscle Tone/Strength: Functionally intact. No obvious neuro-muscular anomalies detected. Sensory (Neurological): Unimpaired Palpation: No palpable anomalies              Upper Extremity (UE) Exam    Side: Right upper extremity  Side: Left upper extremity  Skin & Extremity Inspection: Skin color, temperature, and hair growth are WNL. No peripheral edema or cyanosis. No masses, redness, swelling, asymmetry, or associated skin lesions. No contractures.  Skin & Extremity Inspection: Skin color, temperature, and hair growth are WNL. No peripheral edema or cyanosis. No masses, redness, swelling, asymmetry, or associated skin lesions. No contractures.  Functional ROM: Unrestricted ROM          Functional ROM: Unrestricted ROM          Muscle Tone/Strength: Functionally intact. No obvious neuro-muscular anomalies detected.  Muscle Tone/Strength: Functionally intact. No obvious neuro-muscular anomalies detected.  Sensory (Neurological): Unimpaired          Sensory (Neurological): Unimpaired          Palpation: No palpable anomalies              Palpation: No palpable anomalies                   Thoracic Spine Area Exam  Skin & Axial  Inspection: No masses, redness, or swelling Alignment: Symmetrical Functional ROM: Adequate ROM Stability: No instability detected Muscle Tone/Strength: Increased muscle tone over affected area Sensory (Neurological): Movement associated pain Muscle strength & Tone: Uncomfortable  Lumbar Spine Area Exam  Skin & Axial Inspection: Well healed scar from previous spine surgery detected Alignment: Symmetrical Functional ROM: Unrestricted ROM       Stability: No instability detected Muscle Tone/Strength: Functionally intact. No obvious neuro-muscular anomalies detected. Sensory (Neurological): Unimpaired Palpation: No palpable anomalies         Gait & Posture Assessment  Ambulation: Unassisted Gait: Relatively normal for age and body habitus Posture: WNL   Lower Extremity Exam    Side: Right lower extremity  Side: Left lower extremity  Stability: No instability observed          Stability: No instability observed          Skin & Extremity Inspection: Skin color, temperature, and hair growth are WNL. No peripheral edema or cyanosis. No masses, redness, swelling, asymmetry, or associated skin lesions. No contractures.  Skin & Extremity Inspection: Skin color, temperature, and hair growth are WNL. No peripheral edema or cyanosis. No masses, redness, swelling, asymmetry, or associated skin lesions. No contractures.  Functional ROM: Unrestricted ROM                  Functional ROM: Unrestricted ROM                  Muscle Tone/Strength: Functionally intact. No obvious neuro-muscular anomalies detected.  Muscle Tone/Strength: Functionally intact. No obvious neuro-muscular anomalies detected.  Sensory (Neurological): Unimpaired        Sensory (Neurological): Unimpaired  Palpation: No palpable anomalies  Palpation: No palpable anomalies   Assessment  Primary Diagnosis & Pertinent Problem List: The primary encounter diagnosis was Trigger point with back pain. Diagnoses of Lumbar facet  osteoarthritis (Bilateral), Osteoarthritis of lumbar spine, Chronic pain syndrome, Long term prescription opiate use, and Chronic upper back pain were also pertinent to this visit.  Status Diagnosis  Worsening Persistent Persistent 1. Trigger point with back pain   2. Lumbar facet osteoarthritis (Bilateral)   3. Osteoarthritis of lumbar spine   4. Chronic pain syndrome   5. Long term prescription opiate use   6. Chronic upper back pain     Problems updated and reviewed during this visit: Problem  Chronic Upper Back Pain   Plan of Care  Pharmacotherapy (Medications Ordered): Meds ordered this encounter  Medications  . Oxycodone HCl 10 MG TABS    Sig: Take 1 tablet (10 mg total) by mouth every 6 (six) hours as needed. Must last 30 days.    Dispense:  120 tablet    Refill:  0    Do not place this medication, or any other prescription from our practice, on "Automatic Refill". Patient may have prescription filled one day early if pharmacy is closed on scheduled refill date.    Order Specific Question:   Supervising Provider    Answer:   Milinda Pointer [phone]  . Oxycodone HCl 10 MG TABS    Sig: Take 1 tablet (10 mg total) by mouth every 6 (six) hours as needed. Must last 30 days.    Dispense:  120 tablet    Refill:  0    Do not place this medication, or any other prescription from our practice, on "Automatic Refill". Patient may have prescription filled one day early if pharmacy is closed on scheduled refill date.    Order Specific Question:   Supervising Provider    Answer:   Milinda Pointer [phone]  . celecoxib (CELEBREX) 200 MG capsule    Sig: Take 1 capsule (200 mg total) by mouth 2 (two) times daily.    Dispense:  60 capsule    Refill:  1    Do not place medication on "Automatic Refill". Fill one day early if pharmacy is closed on scheduled refill date.    Order Specific Question:   Supervising Provider    Answer:   Milinda Pointer [[id number]]   New Prescriptions    No medications on file   Medications administered today: Jahmal Dunavant. Paul had no medications administered during this visit. Lab-work, procedure(s), and/or referral(s): Orders Placed This Encounter  Procedures  . TRIGGER POINT INJECTION  . ToxASSURE Select 13 (MW), Urine   Imaging and/or referral(s): None  Interventional management options: Planned, scheduled, and/or pending:   Diagnostic trigger point injection Midline middle Back (R>L)   Considering:   Removal of nonfunctional spinal cord stimulatorfollowed by an MRI of the lumbar spine with and without contrast. Diagnostic bilateral lumbar facet block Possible bilateral lumbar facet RFA Diagnostic caudal epidural steroid injection plus diagnostic epidurogram Possible Racz procedure   Palliative PRN treatment(s):   Therapeuticbilateral lumbar facetRFA#2(Right done on 01/23/18; Left done on 11/21/17)    Provider-requested follow-up: Return in about 2 months (around 05/16/2018) for MedMgmt.  Future Appointments  Date Time Provider [place]  05/17/2018  8:00 AM Vevelyn Francois, NP [facility] None   Primary Care Physician: Hoyt Koch, MD Location: [facility] Note by: Vevelyn Francois NP Date: 03/15/2018; Time: 10:41 AM  Pain  Score Disclaimer: We use the NRS-11 scale. This is a self-reported, subjective measurement of pain severity with only modest accuracy. It is used primarily to identify changes within a particular patient. It must be understood that outpatient pain scales are significantly less accurate that those used for research, where they can be applied under ideal controlled circumstances with minimal exposure to variables. In reality, the score is likely to be a combination of pain intensity and pain affect, where pain affect describes the degree of emotional arousal or changes in action readiness caused by the sensory experience of pain. Factors such as social  and work situation, setting, emotional state, anxiety levels, expectation, and prior pain experience may influence pain perception and show large inter-individual differences that may also be affected by time variables.  Patient instructions provided during this appointment: Patient Instructions  ____________________________________________________________________________________________  Medication Rules  Purpose: To inform patients, and their family members, of our rules and regulations.  Applies to: All patients receiving prescriptions (written or electronic).  Pharmacy of record: Pharmacy where electronic prescriptions will be sent. If written prescriptions are taken to a different pharmacy, please inform the nursing staff. The pharmacy listed in the electronic medical record should be the one where you would like electronic prescriptions to be sent.  Electronic prescriptions: In compliance with the [place] (STOP) Act of 2017 (Session Lanny Cramp [id number]), effective March 28, 2018, all controlled substances must be electronically prescribed. Calling prescriptions to the pharmacy will cease to exist.  Prescription refills: Only during scheduled appointments. Applies to all prescriptions.  NOTE: The following applies primarily to controlled substances (Opioid* Pain Medications).   Patient's responsibilities: 1. Pain Pills: Bring all pain pills to every appointment (except for procedure appointments). 2. Pill Bottles: Bring pills in original pharmacy bottle. Always bring the newest bottle. Bring bottle, even if empty. 3. Medication refills: You are responsible for knowing and keeping track of what medications you take and those you need refilled. The day before your appointment: write a list of all prescriptions that need to be refilled. The day of the appointment: give the list to the admitting nurse. Prescriptions will be written only during  appointments. If you forget a medication: it will not be "Called in", "Faxed", or "electronically sent". You will need to get another appointment to get these prescribed. No early refills. Do not call asking to have your prescription filled early. 4. Prescription Accuracy: You are responsible for carefully inspecting your prescriptions before leaving our office. Have the discharge nurse carefully go over each prescription with you, before taking them home. Make sure that your name is accurately spelled, that your address is correct. Check the name and dose of your medication to make sure it is accurate. Check the number of pills, and the written instructions to make sure they are clear and accurate. Make sure that you are given enough medication to last until your next medication refill appointment. 5. Taking Medication: Take medication as prescribed. When it comes to controlled substances, taking less pills or less frequently than prescribed is permitted and encouraged. Never take more pills than instructed. Never take medication more frequently than prescribed.  6. Inform other Doctors: Always inform, all of your healthcare providers, of all the medications you take. 7. Pain Medication from other Providers: You are not allowed to accept any additional pain medication from any other Doctor or Healthcare provider. There are two exceptions to this rule. (see below) In the event that you require additional pain  medication, you are responsible for notifying us, as stated below. 8. Medication Agreement: You are responsible for carefully reading and following our Medication Agreement. This must be signed before receiving any prescriptions from our practice. Safely store a copy of your signed Agreement. Violations to the Agreement will result in no further prescriptions. (Additional copies of our Medication Agreement are available upon request.) 9. Laws, Rules, & Regulations: All patients are expected to follow  all Federal and [employer], [employer], Rules, [employer]. Ignorance of the Laws does not constitute a valid excuse. The use of any illegal substances is prohibited. 10. Adopted CDC guidelines & recommendations: Target dosing levels will be at or below 60 MME/day. Use of benzodiazepines** is not recommended.  Exceptions: There are only two exceptions to the rule of not receiving pain medications from other Healthcare Providers. 1. Exception #1 (Emergencies): In the event of an emergency (i.e.: accident requiring emergency care), you are allowed to receive additional pain medication. However, you are responsible for: As soon as you are able, call our office [phone], at any time of the day or night, and leave a message stating your name, the date and nature of the emergency, and the name and dose of the medication prescribed. In the event that your call is answered by a member of our staff, make sure to document and save the date, time, and the name of the person that took your information.  2. Exception #2 (Planned Surgery): In the event that you are scheduled by another doctor or dentist to have any type of surgery or procedure, you are allowed (for a period no longer than 30 days), to receive additional pain medication, for the acute post-op pain. However, in this case, you are responsible for picking up a copy of our "Post-op Pain Management for Surgeons" handout, and giving it to your surgeon or dentist. This document is available at our office, and does not require an appointment to obtain it. Simply go to our office during business hours (Monday-Thursday from 8:00 AM to 4:00 PM) (Friday 8:00 AM to 12:00 Noon) or if you have a scheduled appointment with [place], prior to your surgery, and ask for it by name. In addition, you will need to provide [place] with your name, name of your surgeon, type of surgery, and date of procedure or surgery.  *Opioid medications include: morphine, codeine, oxycodone,  oxymorphone, hydrocodone, hydromorphone, meperidine, tramadol, tapentadol, buprenorphine, fentanyl, methadone. **Benzodiazepine medications include: diazepam (Valium), alprazolam (Xanax), clonazepam (Klonopine), lorazepam (Ativan), clorazepate (Tranxene), chlordiazepoxide (Librium), estazolam (Prosom), oxazepam (Serax), temazepam (Restoril), triazolam (Halcion) (Last updated: 05/25/2017) ____________________________________________________________________________________________

## 2018-03-20 LAB — TOXASSURE SELECT 13 (MW), URINE

## 2018-04-10 ENCOUNTER — Ambulatory Visit: Payer: No Typology Code available for payment source | Attending: Nurse Practitioner | Admitting: Pain Medicine

## 2018-04-10 ENCOUNTER — Other Ambulatory Visit: Payer: Self-pay

## 2018-04-10 ENCOUNTER — Encounter: Payer: Self-pay | Admitting: Pain Medicine

## 2018-04-10 VITALS — BP 132/88 | HR 66 | Temp 97.7°F | Resp 18 | Ht 70.0 in | Wt 200.0 lb

## 2018-04-10 DIAGNOSIS — G8929 Other chronic pain: Secondary | ICD-10-CM | POA: Diagnosis present

## 2018-04-10 DIAGNOSIS — M961 Postlaminectomy syndrome, not elsewhere classified: Secondary | ICD-10-CM | POA: Insufficient documentation

## 2018-04-10 DIAGNOSIS — M549 Dorsalgia, unspecified: Secondary | ICD-10-CM | POA: Insufficient documentation

## 2018-04-10 DIAGNOSIS — M7918 Myalgia, other site: Secondary | ICD-10-CM | POA: Diagnosis present

## 2018-04-10 MED ORDER — ROPIVACAINE HCL 2 MG/ML IJ SOLN
9.0000 mL | Freq: Once | INTRAMUSCULAR | Status: AC
  Administered 2018-04-10: 10 mL

## 2018-04-10 MED ORDER — TRIAMCINOLONE ACETONIDE 40 MG/ML IJ SUSP
INTRAMUSCULAR | Status: AC
  Filled 2018-04-10: qty 1

## 2018-04-10 MED ORDER — LIDOCAINE HCL 2 % IJ SOLN
INTRAMUSCULAR | Status: AC
  Filled 2018-04-10: qty 20

## 2018-04-10 MED ORDER — ROPIVACAINE HCL 2 MG/ML IJ SOLN
INTRAMUSCULAR | Status: AC
  Filled 2018-04-10: qty 10

## 2018-04-10 MED ORDER — LIDOCAINE HCL 2 % IJ SOLN
20.0000 mL | Freq: Once | INTRAMUSCULAR | Status: AC
  Administered 2018-04-10: 400 mg

## 2018-04-10 MED ORDER — TRIAMCINOLONE ACETONIDE 40 MG/ML IJ SUSP
40.0000 mg | Freq: Once | INTRAMUSCULAR | Status: AC
  Administered 2018-04-10: 40 mg

## 2018-04-10 NOTE — Patient Instructions (Signed)

## 2018-04-10 NOTE — Progress Notes (Signed)
Patient's Name: Lance Roman  MRN: [id number]  Referring Provider: Barbette Merino, NP  DOB: [birth year]  PCP: Myrlene Broker, MD  DOS: 04/10/2018  Note by: Oswaldo Done, MD  Service setting: Ambulatory outpatient  Specialty: Interventional Pain Management  Patient type: Established  Location: [facility] (AMB) Pain Management Facility  Visit type: Interventional Procedure   Primary Reason for Visit: Interventional Pain Management Treatment. CC: Back Pain (middle, lower)  Procedure:          Anesthesia, Analgesia, Anxiolysis:  Type: Trigger Point Injection (1-2 muscle groups) #2 (Right erector spinae muscle at the lever of the lower scapular border & left quadratus lumborum muscle at the level of the PSIS, on the left.) CPT: 20552 Primary Purpose: Therapeutic Region: Mid & Lower Lumbosacral Level: PSIS on the left and lower scapular border on right Target Area: Trigger Point Approach: Percutaneous, ipsilateral approach. Laterality: Bilateral        Type: Local Anesthesia Indication(s): Analgesia         Local Anesthetic: Lidocaine 1-2% Route: Infiltration ([place]/IM) IV Access: Declined Sedation: Declined   Position: Jacknife   Indications: 1. Trigger point with back pain   2. 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 Routon is a 58 y.o. (year old), male patient, seen today for interventional treatment. He  has a past surgical history that includes Appendectomy; repairs of bilateral ankle fractures; Spinal fusion (N/A); and Spinal cord stimulator implant (N/A). Mr. Kerwick has a current medication list which includes the following prescription(s): celecoxib, esomeprazole, gabapentin, ibuprofen, olopatadine, oxycodone hcl, and oxycodone hcl. His primarily concern today is the Back Pain (middle, lower)  Initial Vital Signs:  Pulse/HCG Rate: 66  Temp: 97.7 F  (36.5 C) Resp: 18 BP: 132/88 SpO2: 100 %  BMI: Estimated body mass index is 28.7 kg/m as calculated from the following:   Height as of this encounter: 5\' 10"  (1.778 m).   Weight as of this encounter: 200 lb (90.7 kg).  Risk Assessment: Allergies: Reviewed. He has No Known Allergies.  Allergy Precautions: None required Coagulopathies: Reviewed. None identified.  Blood-thinner therapy: None at this time Active Infection(s): Reviewed. None identified. Mr. Rothermel is afebrile  Site Confirmation: Mr. Rough was asked to confirm the procedure and laterality before marking the site Procedure checklist: Completed Consent: Before the procedure and under the influence of no sedative(s), amnesic(s), or anxiolytics, the patient was informed of the treatment options, risks and possible complications. To fulfill our ethical and legal obligations, as recommended by the American Medical Association's Code of Ethics, I have informed the patient of my clinical impression; the nature and purpose of the treatment or procedure; the risks, benefits, and possible complications of the intervention; the alternatives, including doing nothing; the risk(s) and benefit(s) of the alternative treatment(s) or procedure(s); and the risk(s) and benefit(s) of doing nothing. The patient was provided information about the general risks and possible complications associated with the procedure. These may include, but are not limited to: failure to achieve desired goals, infection, bleeding, organ or nerve damage, allergic reactions, paralysis, and death. In addition, the patient was informed of those risks and complications associated to the procedure, such as failure to decrease pain; infection; bleeding; organ or nerve damage with subsequent damage to sensory, motor, and/or autonomic systems, resulting in permanent pain, numbness, and/or weakness of one or several areas of the body; allergic reactions; (  i.e.: anaphylactic  reaction); and/or death. Furthermore, the patient was informed of those risks and complications associated with the medications. These include, but are not limited to: allergic reactions (i.e.: anaphylactic or anaphylactoid reaction(s)); adrenal axis suppression; blood sugar elevation that in diabetics may result in ketoacidosis or comma; water retention that in patients with history of congestive heart failure may result in shortness of breath, pulmonary edema, and decompensation with resultant heart failure; weight gain; swelling or edema; medication-induced neural toxicity; particulate matter embolism and blood vessel occlusion with resultant organ, and/or nervous system infarction; and/or aseptic necrosis of one or more joints. Finally, the patient was informed that Medicine is not an exact science; therefore, there is also the possibility of unforeseen or unpredictable risks and/or possible complications that may result in a catastrophic outcome. The patient indicated having understood very clearly. We have given the patient no guarantees and we have made no promises. Enough time was given to the patient to ask questions, all of which were answered to the patient's satisfaction. Mr. Kumari has indicated that he wanted to continue with the procedure. Attestation: I, the ordering provider, attest that I have discussed with the patient the benefits, risks, side-effects, alternatives, likelihood of achieving goals, and potential problems during recovery for the procedure that I have provided informed consent. Date  Time: 04/10/2018  2:02 PM  Pre-Procedure Preparation:  Monitoring: As per clinic protocol. Respiration, ETCO2, SpO2, BP, heart rate and rhythm monitor placed and checked for adequate function Safety Precautions: Patient was assessed for positional comfort and pressure points before starting the procedure. Time-out: I initiated and conducted the "Time-out" before starting the procedure, as per  protocol. The patient was asked to participate by confirming the accuracy of the "Time Out" information. Verification of the correct person, site, and procedure were performed and confirmed by me, the nursing staff, and the patient. "Time-out" conducted as per Joint Commission's Universal Protocol ([email or web address].01.01.01). Time: 1425  Description of Procedure:          Area Prepped: Entire Lumbosacral Region Prepping solution: ChloraPrep (2% chlorhexidine gluconate and 70% isopropyl alcohol) Safety Precautions: Aspiration looking for blood return was conducted prior to all injections. At no point did we inject any substances, as a needle was being advanced. No attempts were made at seeking any paresthesias. Safe injection practices and needle disposal techniques used. Medications properly checked for expiration dates. SDV (single dose vial) medications used. Description of the Procedure: Protocol guidelines were followed. The patient was placed in position over the fluoroscopy table. The target area was identified and the area prepped in the usual manner. Skin & deeper tissues infiltrated with local anesthetic. Appropriate amount of time allowed to pass for local anesthetics to take effect. The procedure needles were then advanced to the target area. Proper needle placement secured. Negative aspiration confirmed. Solution injected in intermittent fashion, asking for systemic symptoms every 0.5cc of injectate. The needles were then removed and the area cleansed, making sure to leave some of the prepping solution back to take advantage of its long term bactericidal properties.  Vitals:   04/10/18 1400  BP: 132/88  Pulse: 66  Resp: 18  Temp: 97.7 F (36.5 C)  TempSrc: Oral  SpO2: 100%  Weight: 200 lb (90.7 kg)  Height: 5\' 10"  (1.778 m)    Start Time: 1425 hrs. End Time: 1429 hrs. Materials:  Needle(s) Type: Epidural needle Gauge: 25G Length: 1.5-in Medication(s): Please see orders for medications and  dosing details.  Imaging Guidance:  Type of Imaging Technique: None used Indication(s): N/A Exposure Time: No patient exposure Contrast: None used. Fluoroscopic Guidance: N/A Ultrasound Guidance: N/A Interpretation: N/A  Antibiotic Prophylaxis:   Anti-infectives (From admission, onward)   None     Indication(s): None identified  Post-operative Assessment:  Post-procedure Vital Signs:  Pulse/HCG Rate: 66  Temp: 97.7 F (36.5 C) Resp: 18 BP: 132/88 SpO2: 100 %  EBL: None  Complications: No immediate post-treatment complications observed by team, or reported by patient.  Note: The patient tolerated the entire procedure well. A repeat set of vitals were taken after the procedure and the patient was kept under observation following institutional policy, for this type of procedure. Post-procedural neurological assessment was performed, showing return to baseline, prior to discharge. The patient was provided with post-procedure discharge instructions, including a section on how to identify potential problems. Should any problems arise concerning this procedure, the patient was given instructions to immediately contact us, at any time, without hesitation. In any case, we plan to contact the patient by telephone for a follow-up status report regarding this interventional procedure.  Comments:  No additional relevant information.  Plan of Care  Interventional management options: Planned, scheduled, and/or pending:   Diagnostic/therapeutic trigger point injection #2 of the mid back and lower back area, bilaterally (today). Therapeutic Revision and replacement of spinal cord stimulator with MRI compatible device. Copy of spinal cord stimulator ID indicates ANS system implanted by Dr. Lazarus SalinesStewart Malloy in 2003. 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   Considering:   Removal of nonfunctional spinal cord stimulatorfollowed by an MRI of the lumbar spine with and without  contrast. Diagnostic caudal epidural steroid injection + diagnostic epidurogram Possible Racz procedure   Palliative PRN treatment(s):   Therapeuticbilateral lumbar facetRFA#2(Right done on 01/23/18; Left done on 11/21/17)   Imaging Orders  No imaging studies ordered today    Procedure Orders     TRIGGER POINT INJECTION  Medications ordered for procedure: Meds ordered this encounter  Medications  . ropivacaine (PF) 2 mg/mL (0.2%) (NAROPIN) injection 9 mL  . triamcinolone acetonide (KENALOG-40) injection 40 mg  . lidocaine (XYLOCAINE) 2 % (with pres) injection 400 mg   Medications administered: We administered ropivacaine (PF) 2 mg/mL (0.2%), triamcinolone acetonide, and lidocaine.  See the medical record for exact dosing, route, and time of administration.  Disposition: Discharge home  Discharge Date & Time: 04/10/2018; 1435 hrs.   Physician-requested Follow-up: Return for Med-Mgmt, w/ Thad Rangerrystal King, NP.  Future Appointments  Date Time Provider Department Center  05/17/2018  8:00 AM Barbette [facility]inoKing, Crystal M, [facility]   Primary Care Physician: Myrlene Brokerrawford, Elizabeth A, MD [facility] Note by: Oswaldo DoneFrancisco A Karlos Scadden, MD Date: 04/10/2018; Time: 2:39 PM  Disclaimer:  Medicine is not an Visual merchandiserexact science. The only guarantee in medicine is that nothing is guaranteed. It is important to note that the decision to proceed with this intervention was based on the information collected from the patient. The Data and conclusions were drawn from the patient's questionnaire, the interview, and the physical examination. Because the information was provided in large part by the patient, it cannot be guaranteed that it has not been purposely or unconsciously manipulated. Every effort has been made to obtain as much relevant data as possible for this evaluation. It is important to note that the conclusions that lead to this procedure are derived in large part from  the available data. Always take into account that the treatment will also be dependent on availability  of resources and existing treatment guidelines, considered by other Pain Management Practitioners as being common knowledge and practice, at the time of the intervention. For Medico-Legal purposes, it is also important to point out that variation in procedural techniques and pharmacological choices are the acceptable norm. The indications, contraindications, technique, and results of the above procedure should only be interpreted and judged by a Board-Certified Interventional Pain Specialist with extensive familiarity and expertise in the same exact procedure and technique.

## 2018-04-11 ENCOUNTER — Telehealth: Payer: Self-pay | Admitting: *Deleted

## 2018-04-11 NOTE — Telephone Encounter (Signed)
Attempted to call for post procedure follow-up. Unable to leave a message. 

## 2018-04-19 ENCOUNTER — Telehealth: Payer: Self-pay | Admitting: Pain Medicine

## 2018-04-19 NOTE — Telephone Encounter (Signed)
Will schedule appointment

## 2018-04-19 NOTE — Progress Notes (Signed)
Patient's Name: Lance Roman  MRN: [id number]  Referring Provider: Hoyt Koch, *  DOB: 1960/08/03  PCP: Hoyt Koch, MD  DOS: 04/23/2018  Note by: Gaspar Cola, MD  Service setting: Ambulatory outpatient  Specialty: Interventional Pain Management  Location: [facility] (AMB) Pain Management Facility    Patient type: Established   Primary Reason(s) for Visit: Encounter for post-procedure evaluation of chronic illness with mild to moderate exacerbation, as well as review of chronic illnesses with exacerbation, or progression (Level of risk: moderate) CC: Back Pain (low and upper) and Foot Pain (left heel)  HPI  [name] is a 58 y.o. year old, male patient, who comes today for a post-procedure evaluation. He has GERD; Chronic low back pain (Primary Area of Pain) (Bilateral) (L>R); Pharmacologic therapy; Infected sebaceous cyst; Disorder of skeletal system; Problems influencing health status; Opiate use (150 MME/day); Long term prescription opiate use; Chronic pain syndrome; Muscle cramps; Long-term current use of opiate analgesic; Chronic lower extremity pain (Secondary Area of Pain) (Left); Chronic lumbar radicular pain (L5) (Left); Retrolisthesis (4-5 mm) (L3 over L4) (L2 over L3; Failed back surgical syndrome (Fusion L4-S1); Lumbar facet hypertrophy (Bilateral); Lumbar facet syndrome (Bilateral) (L>R); Spinal cord stimulator dysfunction (fractured leads); Lumbar facet osteoarthritis (Bilateral); Osteoarthritis of lumbar spine; Spondylosis without myelopathy or radiculopathy, lumbosacral region; Other specified dorsopathies, sacral and sacrococcygeal region; Chronic hip pain (Left); Chronic knee pain (Left); DDD (degenerative disc disease), lumbosacral; Neurogenic pain; Trigger point with back pain; Chronic upper back pain; and Chronic musculoskeletal pain on their problem list. His primarily concern today is the Back Pain (low and upper) and Foot Pain (left heel)  Pain  Assessment: Location: Lower, Upper Back Radiating: legs, left heel Onset: More than a month ago Duration: Chronic pain Quality: Burning, Aching, Sore Severity: 3 /10 (subjective, self-reported pain score)  Note: Reported level is compatible with observation.                         When using our objective Pain Scale, levels between 6 and 10/10 are said to belong in an emergency room, as it progressively worsens from a 6/10, described as severely limiting, requiring emergency care not usually available at an outpatient pain management facility. At a 6/10 level, communication becomes difficult and requires great effort. Assistance to reach the emergency department may be required. Facial flushing and profuse sweating along with potentially dangerous increases in heart rate and blood pressure will be evident. Timing: Constant Modifying factors: rest BP: (!) 147/91  HR: (!) 58  [name] comes in today for review of his chronic pain problem and post-procedure evaluation after the treatment done on 04/19/2018.  The patient comes into the clinic today indicating that his low back pain has gotten worse secondary to spasms.  After careful evaluation, I would seem to me that he is suffering from muscle spasms associated to the transient decrease in electrolytes secondary to the steroids.  I have recommended that he start taking a glass of Gatorade with each meal, then magnesium 500 mg twice daily, and perhaps also take some tonic water at night since most of his cramps are in the legs and at bedtime.  Further details on both, my assessment(s), as well as the proposed treatment plan, please see below.  Post-Procedure Assessment  04/19/2018 Procedure: Diagnostic/therapeutictrigger point injection #2of the mid back and lower back area, bilaterally. Pre-procedure pain score:  2/10 Post-procedure pain score: 2/10 No initial benefit, possibly due to  rapid discharge after no sedation procedure, without enough  time to allow full onset of block. Influential Factors: BMI: 28.70 kg/m Intra-procedural challenges: None observed.         Assessment challenges: None detected.              Reported side-effects: None.        Post-procedural adverse reactions or complications: None reported         Sedation: No sedation used. When no sedatives are used, the analgesic levels obtained are directly associated to the effectiveness of the local anesthetics. However, when sedation is provided, the level of analgesia obtained during the initial 1 hour following the intervention, is believed to be the result of a combination of factors. These factors may include, but are not limited to: 1. The effectiveness of the local anesthetics used. 2. The effects of the analgesic(s) and/or anxiolytic(s) used. 3. The degree of discomfort experienced by the patient at the time of the procedure. 4. The patients ability and reliability in recalling and recording the events. 5. The presence and influence of possible secondary gains and/or psychosocial factors. Reported result: Relief experienced during the 1st hour after the procedure: 100 % (Ultra-Short Term Relief)            Interpretative annotation: Clinically appropriate result. Analgesia during this period is likely to be Local Anesthetic and/or IV Sedative (Analgesic/Anxiolytic) related.          Effects of local anesthetic: The analgesic effects attained during this period are directly associated to the localized infiltration of local anesthetics and therefore cary significant diagnostic value as to the etiological location, or anatomical origin, of the pain. Expected duration of relief is directly dependent on the pharmacodynamics of the local anesthetic used. Long-acting (4-6 hours) anesthetics used.  Reported result: Relief during the next 4 to 6 hour after the procedure: 100 % (Short-Term Relief)            Interpretative annotation: Clinically appropriate result.  Analgesia during this period is likely to be Local Anesthetic-related.          Long-term benefit: Defined as the period of time past the expected duration of local anesthetics (1 hour for short-acting and 4-6 hours for long-acting). With the possible exception of prolonged sympathetic blockade from the local anesthetics, benefits during this period are typically attributed to, or associated with, other factors such as analgesic sensory neuropraxia, antiinflammatory effects, or beneficial biochemical changes provided by agents other than the local anesthetics.  Reported result: Extended relief following procedure: 0 % (Long-Term Relief)            Interpretative annotation: Clinically possible results. No benefit. Therapeutic failure. Inflammation plays a part in the etiology to the pain.          Current benefits: Defined as reported results that persistent at this point in time.   Analgesia: 0 %            Function: Back to baseline ROM: Back to baseline Interpretative annotation: Recurrence of symptoms.    Results would suggest persistent aggravating factors.          Interpretation: Results would suggest that the symptoms that he is describing are associated with the transient loss of electrolytes caused by the steroids.                  Plan:  We will be treating this with the usual protocol using Gatorade, magnesium, and I will provide him with some muscle  relaxant that he can also take.                Laboratory Chemistry  Inflammation Markers (CRP: Acute Phase) (ESR: Chronic Phase) Lab Results  Component Value Date   CRP 2.5 12/12/2016   ESRSEDRATE 10 12/12/2016                         Rheumatology Markers Lab Results  Component Value Date   LABURIC 6.4 08/11/2008                        Renal Function Markers Lab Results  Component Value Date   BUN 20 12/12/2016   CREATININE 0.74 (L) 12/12/2016   BCR 27 (H) 12/12/2016   GFRAA 120 12/12/2016   GFRNONAA 104 12/12/2016                              Hepatic Function Markers Lab Results  Component Value Date   AST 21 12/12/2016   ALT 12 12/30/2014   ALBUMIN 4.5 12/12/2016   ALKPHOS 66 12/12/2016   HCVAB NEGATIVE 12/30/2014                        Electrolytes Lab Results  Component Value Date   NA 140 12/12/2016   K 4.5 12/12/2016   CL 99 12/12/2016   CALCIUM 9.7 12/12/2016   MG 1.9 12/12/2016                        Neuropathy Markers Lab Results  Component Value Date   WUGQBVQX45 038 12/12/2016                        CNS Tests No results found for: COLORCSF, APPEARCSF, RBCCOUNTCSF, WBCCSF, POLYSCSF, LYMPHSCSF, EOSCSF, PROTEINCSF, GLUCCSF, JCVIRUS, CSFOLI, IGGCSF                      Bone Pathology Markers Lab Results  Component Value Date   25OHVITD1 44 12/12/2016   25OHVITD2 <1.0 12/12/2016   25OHVITD3 43 12/12/2016                         Coagulation Parameters Lab Results  Component Value Date   PLT 167.0 03/03/2011                        Cardiovascular Markers Lab Results  Component Value Date   HGB 13.1 03/03/2011   HCT 38.5 (L) 03/03/2011                         CA Markers No results found for: CEA, CA125, LABCA2                      Note: Lab results reviewed.  Recent Diagnostic Imaging Review  Thoracic Imaging: Thoracic DG 2-3 views:  Results for orders placed in visit on 04/25/02  DG Thoracic Spine 1 View   Narrative FINDINGS * CORRECTED ORDER/REFERRING PHYSICIAN ** DR. Nicole Kindred MELOY  Winslow DR       Adrian [place], [place] CLINICAL DATA:  SPINAL CORD STIMULATOR LEAD PLACEMENT. TWO VIEW, THORACIC SPINE THERE ARE TWO LEADS SEEN DORSALLY LOCATED WITHIN THE SPINAL CANAL.  THESE EXTEND TO  THE T9 VERTEBRAL LEVEL.  THE THORACIC VERTEBRAE SHOW MINOR DEGENERATIVE CHANGE BUT OTHERWISE HAVE A NORMAL APPEARANCE. IMPRESSION SPINAL CORD STIMULATOR WITH LEADS PRESENT WITHIN THE SPINAL CANAL DORSALLY AS DISCUSSED ABOVE. OTHERWISE, NORMAL VIEWS OF THE THORACIC SPINE ASIDE FROM  MINOR DEGENERATIVE CHANGE.   Lumbosacral Imaging: Lumbar CT wo contrast:  Results for orders placed during the hospital encounter of 08/28/17  CT LUMBAR SPINE WO CONTRAST   Narrative CLINICAL DATA:  Previous lumbar fusion. Recurrent low back pain extending into the lower extremities bilaterally. Left S1 radiculopathy.  EXAM: LUMBAR MYELOGRAM  FLUOROSCOPY TIME:  Radiation Exposure Index (as provided by the fluoroscopic device): 5,397 uGy*m2  Fluoroscopy Time:  48 seconds  Number of Acquired Images:  14  PROCEDURE: After thorough discussion of risks and benefits of the procedure including bleeding, infection, injury to nerves, blood vessels, adjacent structures as well as headache and CSF leak, written and oral informed consent was obtained. Consent was obtained by Dr. San Morelle. Time out form was completed.  Patient was positioned prone on the fluoroscopy table. Local anesthesia was provided with 1% lidocaine without epinephrine after prepped and draped in the usual sterile fashion. Puncture was performed at L1-2 using a 3 1/2 inch 22-gauge spinal needle via right paramedian approach. Using a single pass through the dura, the needle was placed within the thecal sac, with return of clear CSF. 15 mL of Isovue M-200 was injected into the thecal sac, with normal opacification of the nerve roots and cauda equina consistent with free flow within the subarachnoid space.  I personally performed the lumbar puncture and administered the intrathecal contrast. I also personally supervised acquisition of the myelogram images.  TECHNIQUE: Contiguous axial images were obtained through the Lumbar spine after the intrathecal infusion of infusion. Coronal and sagittal reconstructions were obtained of the axial image sets.  COMPARISON:  None  FINDINGS: LUMBAR MYELOGRAM FINDINGS:  Lumbar fusion is noted at L4-5 and L5-S1. Spinal cord stimulator enters at T12-L1. The  stimulator wires are fragmented. Fusion hardware is intact.  Mild subarticular narrowing is present bilaterally at L1-2 and on the right at L2-3.  Lower lumbar nerve roots fill normally on both sides.  Slight retrolisthesis is present at L3-4 and L2-3. This is exaggerated with standing. Increased uncovering of broad-based disc protrusions is present at L2-3 and L3-4 with standing. The retrolisthesis is worse with extension and reduced in flexion. The disc disease with standing and extension is greatest at L3-4.  CT LUMBAR MYELOGRAM FINDINGS:  The lumbar spine is imaged from T11-12 through S1-2.  Schmorl's nodes are present at T11-12 and T12-L1. Vertebral body heights are normal. There is solid fusion anteriorly and posteriorly at L4-5 and L5-S1.  Slight retrolisthesis is present at L3-4. AP alignment is otherwise anatomic.  Limited imaging of the abdomen is unremarkable.  L1-2: Facet hypertrophy is worse on the right. No significant focal disc protrusion or stenosis is present.  L2-3: A mild broad-based disc protrusion is present. Moderate facet hypertrophy is noted bilaterally. Focal stenosis is present on the supine images.  L3-4: A left paramedian central disc protrusion is present. Moderate facet hypertrophy is noted. Mild foraminal narrowing is worse on the right. The central canal is patent.  L4-5: Left laminectomy and facetectomy are noted. Solid fusion is present anteriorly and posteriorly. No residual or recurrent stenosis is present.  L5-S1: Bilateral laminectomies are noted. Solid fusion is present anteriorly and posteriorly. The nerve roots are distributed peripherally. No residual stenosis is present.  IMPRESSION:  1. Solid fusion at L4-5 and L5-S1 without residual or recurrent stenosis at these levels. 2. Adjacent level disease at L3-4. Central disc protrusion and bilateral facet hypertrophy is noted on the CT scan. Retrolisthesis is exaggerated when  the patient stands and extends contributing to dynamic central canal and likely foraminal stenosis. 3. More mild retrolisthesis at L2-3 is also worse with standing and extension. 4. Mild facet hypertrophy at L1-2 without significant stenosis. 5. Spinal cord stimulator is in place. The leads are fragmented as previously noted.   Electronically Signed   By: San Morelle M.D.   On: 08/28/2017 11:12    Lumbar CT w/wo contrast:  Results for orders placed in visit on 04/14/17  CT L SPINE LTD WO OR W CONTRAST   Lumbar DG 2-3 views:  Results for orders placed in visit on 08/22/00  DG Lumbar Spine 2-3 Views   Narrative FINDINGS CLINICAL DATA:  STATUS-POST L4-5 FUSION, NOW FOR CHECK-UP. LUMBAR SPINE, TWO VIEWS: COMPARISON IS MADE TO June 13, 2000. THE PATIENT IS STATUS-POST POSTERIOR FUSION FROM L-4 THROUGH S-1. THE DISC SPACES REMAIN IN PLACE. THERE IS NORMAL ALIGNMENT. NO HARDWARE COMPLICATING FEATURE IS SEEN. NO CHANGE SINCE THE PRIOR EXAM. IMPRESSION NO SIGNIFICANT CHANGE IN THE POST-OP APPEARANCE OF THE LUMBOSACRAL SPINE.   Lumbar DG Bending views:  Results for orders placed during the hospital encounter of 12/12/16  DG Lumbar Spine Complete W/Bend   Narrative CLINICAL DATA:  58 year old male with chronic lumbar back pain. Prior surgery and spinal stimulator.  EXAM: LUMBAR SPINE - COMPLETE WITH BENDING VIEWS  COMPARISON:  Report of lumbar radiographs 08/22/2000 (no images available).  FINDINGS: Normal lumbar segmentation. Mild retrolisthesis of L3 on L4 measuring 4-5 mm. Lesser retrolisthesis of L2 on L3. Otherwise relatively preserved lumbar lordosis. Previous L4-L5 through L5-S1 fusion and decompression. Solid-appearing arthrodesis. Bilateral transpedicular hardware at those levels appears intact. Mild to moderate associated posterior L3-L4 disc space loss and endplate spurring. Lesser disc space loss and endplate degeneration elsewhere. Mild to moderate facet  hypertrophy at L3-L4, minimal elsewhere.  Posterior approach spinal stimulator device with fractured leads (most notably the right hand side lead near situated between the T12 and L1 spinous processes). The intraspinal component is centered at the T12 level.  SI joints and the sacrum appear to be within normal limits. Visible lower thoracic levels appear intact.  Weight-bearing views in neutral flexion and extension positioning. There is motion at the L3-L4 level, with regressed spondylolisthesis in flexion and exaggerated listhesis in extension. No other abnormal motion identified.  Negative visible bowel gas pattern.  IMPRESSION: 1. Previous L4 through S1 fusion and decompression with solid appearing arthrodesis. Hardware appears intact. 2. 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 3. Spinal stimulator device with fracture of the electrical leads.   Electronically Signed   By: Genevie Ann M.D.   On: 12/12/2016 13:43    Lumbar DG Myelogram Lumbosacral:  Results for orders placed during the hospital encounter of 08/28/17  DG MYELOGRAPHY LUMBAR INJ LUMBOSACRAL   Narrative CLINICAL DATA:  Previous lumbar fusion. Recurrent low back pain extending into the lower extremities bilaterally. Left S1 radiculopathy.  EXAM: LUMBAR MYELOGRAM  FLUOROSCOPY TIME:  Radiation Exposure Index (as provided by the fluoroscopic device): 1,814 uGy*m2  Fluoroscopy Time:  48 seconds  Number of Acquired Images:  14  PROCEDURE: After thorough discussion of risks and benefits of the procedure including bleeding, infection, injury to nerves, blood vessels, adjacent  structures as well as headache and CSF leak, written and oral informed consent was obtained. Consent was obtained by Dr. San Morelle. Time out form was completed.  Patient was positioned prone on the fluoroscopy table.  Local anesthesia was provided with 1% lidocaine without epinephrine after prepped and draped in the usual sterile fashion. Puncture was performed at L1-2 using a 3 1/2 inch 22-gauge spinal needle via right paramedian approach. Using a single pass through the dura, the needle was placed within the thecal sac, with return of clear CSF. 15 mL of Isovue M-200 was injected into the thecal sac, with normal opacification of the nerve roots and cauda equina consistent with free flow within the subarachnoid space.  I personally performed the lumbar puncture and administered the intrathecal contrast. I also personally supervised acquisition of the myelogram images.  TECHNIQUE: Contiguous axial images were obtained through the Lumbar spine after the intrathecal infusion of infusion. Coronal and sagittal reconstructions were obtained of the axial image sets.  COMPARISON:  None  FINDINGS: LUMBAR MYELOGRAM FINDINGS:  Lumbar fusion is noted at L4-5 and L5-S1. Spinal cord stimulator enters at T12-L1. The stimulator wires are fragmented. Fusion hardware is intact.  Mild subarticular narrowing is present bilaterally at L1-2 and on the right at L2-3.  Lower lumbar nerve roots fill normally on both sides.  Slight retrolisthesis is present at L3-4 and L2-3. This is exaggerated with standing. Increased uncovering of broad-based disc protrusions is present at L2-3 and L3-4 with standing. The retrolisthesis is worse with extension and reduced in flexion. The disc disease with standing and extension is greatest at L3-4.  CT LUMBAR MYELOGRAM FINDINGS:  The lumbar spine is imaged from T11-12 through S1-2.  Schmorl's nodes are present at T11-12 and T12-L1. Vertebral body heights are normal. There is solid fusion anteriorly and posteriorly at L4-5 and L5-S1.  Slight retrolisthesis is present at L3-4. AP alignment is otherwise anatomic.  Limited imaging of the abdomen is unremarkable.  L1-2:  Facet hypertrophy is worse on the right. No significant focal disc protrusion or stenosis is present.  L2-3: A mild broad-based disc protrusion is present. Moderate facet hypertrophy is noted bilaterally. Focal stenosis is present on the supine images.  L3-4: A left paramedian central disc protrusion is present. Moderate facet hypertrophy is noted. Mild foraminal narrowing is worse on the right. The central canal is patent.  L4-5: Left laminectomy and facetectomy are noted. Solid fusion is present anteriorly and posteriorly. No residual or recurrent stenosis is present.  L5-S1: Bilateral laminectomies are noted. Solid fusion is present anteriorly and posteriorly. The nerve roots are distributed peripherally. No residual stenosis is present.  IMPRESSION: 1. Solid fusion at L4-5 and L5-S1 without residual or recurrent stenosis at these levels. 2. Adjacent level disease at L3-4. Central disc protrusion and bilateral facet hypertrophy is noted on the CT scan. Retrolisthesis is exaggerated when the patient stands and extends contributing to dynamic central canal and likely foraminal stenosis. 3. More mild retrolisthesis at L2-3 is also worse with standing and extension. 4. Mild facet hypertrophy at L1-2 without significant stenosis. 5. Spinal cord stimulator is in place. The leads are fragmented as previously noted.   Electronically Signed   By: San Morelle M.D.   On: 08/28/2017 11:12    Foot Imaging: Foot-L DG Complete:  Results for orders placed during the hospital encounter of 08/11/08  DG Foot Complete Left   Narrative Clinical Data: Left foot pain and swelling.   LEFT FOOT - COMPLETE 3+  VIEW   Comparison: None   Findings: There are degenerative changes at the first metatarsal phalangeal joint.  No fracture.   IMPRESSION: First metatarsal phalangeal joint osteoarthritis without acute finding.  Provider: Joretta Bachelor   Complexity Note: Imaging results  reviewed. Results shared with Mr. Barron, using Layman's terms.                         Meds   Current Outpatient Medications:  .  celecoxib (CELEBREX) 200 MG capsule, Take 1 capsule (200 mg total) by mouth 2 (two) times daily., Disp: 60 capsule, Rfl: 1 .  esomeprazole (NEXIUM) 40 MG capsule, Take 1 capsule (40 mg total) by mouth 2 (two) times daily before a meal., Disp: 60 capsule, Rfl: 5 .  gabapentin (NEURONTIN) 100 MG capsule, Take 1-3 capsules (100-300 mg total) by mouth at bedtime. Follow written titration schedule., Disp: 90 capsule, Rfl: 2 .  ibuprofen (ADVIL,MOTRIN) 200 MG tablet, Take 400 mg by mouth every 6 (six) hours as needed for moderate pain. , Disp: , Rfl:  .  olopatadine (PATANOL) 0.1 % ophthalmic solution, Place 1 drop into both eyes as needed for allergies. , Disp: , Rfl:  .  Oxycodone HCl 10 MG TABS, Take 1 tablet (10 mg total) by mouth every 6 (six) hours as needed. Must last 30 days., Disp: 120 tablet, Rfl: 0 .  Oxycodone HCl 10 MG TABS, Take 1 tablet (10 mg total) by mouth every 6 (six) hours as needed. Must last 30 days., Disp: 120 tablet, Rfl: 0 .  baclofen (LIORESAL) 10 MG tablet, Take 1 tablet (10 mg total) by mouth 4 (four) times daily., Disp: 120 tablet, Rfl: 2  ROS  Constitutional: Denies any fever or chills Gastrointestinal: No reported hemesis, hematochezia, vomiting, or acute GI distress Musculoskeletal: Denies any acute onset joint swelling, redness, loss of ROM, or weakness Neurological: No reported episodes of acute onset apraxia, aphasia, dysarthria, agnosia, amnesia, paralysis, loss of coordination, or loss of consciousness  Allergies  Mr. Tatar has No Known Allergies.  PFSH  Drug: Mr. Breeden  reports no history of drug use. Alcohol:  reports current alcohol use of about 3.0 standard drinks of alcohol per week. Tobacco:  reports that he has quit smoking. His smokeless tobacco use includes chew. Medical:  has a past medical history of Acute  postoperative pain (11/21/2017), Allergic rhinitis, Chronic pain syndrome, DDD (degenerative disc disease), lumbosacral, GERD (gastroesophageal reflux disease), Hyperlipidemia, Osteoarthritis, and S/P appendectomy. Surgical: Mr. Baig  has a past surgical history that includes Appendectomy; repairs of bilateral ankle fractures; Spinal fusion (N/A); and Spinal cord stimulator implant (N/A). Family: family history includes Diabetes in his maternal grandmother; Heart disease in his father.  Constitutional Exam  General appearance: Well nourished, well developed, and well hydrated. In no apparent acute distress Vitals:   04/23/18 0933  BP: (!) 147/91  Pulse: (!) 58  Resp: 18  Temp: 98.1 F (36.7 C)  TempSrc: Oral  SpO2: 99%  Weight: 200 lb (90.7 kg)  Height: 5' 10"  (1.778 m)   BMI Assessment: Estimated body mass index is 28.7 kg/m as calculated from the following:   Height as of this encounter: 5' 10"  (1.778 m).   Weight as of this encounter: 200 lb (90.7 kg).  BMI interpretation table: BMI level Category Range association with higher incidence of chronic pain  <18 kg/m2 Underweight   18.5-24.9 kg/m2 Ideal body weight   25-29.9 kg/m2 Overweight Increased incidence by 20%  30-34.9 kg/m2 Obese (Class I) Increased incidence by 68%  35-39.9 kg/m2 Severe obesity (Class II) Increased incidence by 136%  >40 kg/m2 Extreme obesity (Class III) Increased incidence by 254%   Patient's current BMI Ideal Body weight  Body mass index is 28.7 kg/m. Ideal body weight: 73 kg (160 lb 15 oz) Adjusted ideal body weight: 80.1 kg (176 lb 9 oz)   BMI Readings from Last 4 Encounters:  04/23/18 28.70 kg/m  04/10/18 28.70 kg/m  03/15/18 28.70 kg/m  02/07/18 29.53 kg/m   Wt Readings from Last 4 Encounters:  04/23/18 200 lb (90.7 kg)  04/10/18 200 lb (90.7 kg)  03/15/18 200 lb (90.7 kg)  02/07/18 200 lb (90.7 kg)  Psych/Mental status: Alert, oriented x 3 (person, place, & time)       Eyes:  PERLA Respiratory: No evidence of acute respiratory distress  Cervical Spine Area Exam  Skin & Axial Inspection: No masses, redness, edema, swelling, or associated skin lesions Alignment: Symmetrical Functional ROM: Unrestricted ROM      Stability: No instability detected Muscle Tone/Strength: Functionally intact. No obvious neuro-muscular anomalies detected. Sensory (Neurological): Unimpaired Palpation: No palpable anomalies              Upper Extremity (UE) Exam    Side: Right upper extremity  Side: Left upper extremity  Skin & Extremity Inspection: Skin color, temperature, and hair growth are WNL. No peripheral edema or cyanosis. No masses, redness, swelling, asymmetry, or associated skin lesions. No contractures.  Skin & Extremity Inspection: Skin color, temperature, and hair growth are WNL. No peripheral edema or cyanosis. No masses, redness, swelling, asymmetry, or associated skin lesions. No contractures.  Functional ROM: Unrestricted ROM          Functional ROM: Unrestricted ROM          Muscle Tone/Strength: Functionally intact. No obvious neuro-muscular anomalies detected.  Muscle Tone/Strength: Functionally intact. No obvious neuro-muscular anomalies detected.  Sensory (Neurological): Unimpaired          Sensory (Neurological): Unimpaired          Palpation: No palpable anomalies              Palpation: No palpable anomalies              Provocative Test(s):  Phalen's test: deferred Tinel's test: deferred Apley's scratch test (touch opposite shoulder):  Action 1 (Across chest): deferred Action 2 (Overhead): deferred Action 3 (LB reach): deferred   Provocative Test(s):  Phalen's test: deferred Tinel's test: deferred Apley's scratch test (touch opposite shoulder):  Action 1 (Across chest): deferred Action 2 (Overhead): deferred Action 3 (LB reach): deferred    Thoracic Spine Area Exam  Skin & Axial Inspection: No masses, redness, or swelling Alignment:  Symmetrical Functional ROM: Unrestricted ROM Stability: No instability detected Muscle Tone/Strength: Functionally intact. No obvious neuro-muscular anomalies detected. Sensory (Neurological): Unimpaired Muscle strength & Tone: No palpable anomalies  Lumbar Spine Area Exam  Skin & Axial Inspection: No masses, redness, or swelling Alignment: Symmetrical Functional ROM: Unrestricted ROM       Stability: No instability detected Muscle Tone/Strength: Functionally intact. No obvious neuro-muscular anomalies detected. Sensory (Neurological): Unimpaired Palpation: No palpable anomalies       Provocative Tests: Hyperextension/rotation test: deferred today       Lumbar quadrant test (Kemp's test): deferred today       Lateral bending test: deferred today       Patrick's Maneuver: deferred today  FABER* test: deferred today                   S-I anterior distraction/compression test: deferred today         S-I lateral compression test: deferred today         S-I Thigh-thrust test: deferred today         S-I Gaenslen's test: deferred today         *(Flexion, ABduction and External Rotation)  Gait & Posture Assessment  Ambulation: Unassisted Gait: Relatively normal for age and body habitus Posture: WNL   Lower Extremity Exam    Side: Right lower extremity  Side: Left lower extremity  Stability: No instability observed          Stability: No instability observed          Skin & Extremity Inspection: Skin color, temperature, and hair growth are WNL. No peripheral edema or cyanosis. No masses, redness, swelling, asymmetry, or associated skin lesions. No contractures.  Skin & Extremity Inspection: Skin color, temperature, and hair growth are WNL. No peripheral edema or cyanosis. No masses, redness, swelling, asymmetry, or associated skin lesions. No contractures.  Functional ROM: Unrestricted ROM                  Functional ROM: Unrestricted ROM                  Muscle  Tone/Strength: Functionally intact. No obvious neuro-muscular anomalies detected.  Muscle Tone/Strength: Functionally intact. No obvious neuro-muscular anomalies detected.  Sensory (Neurological): Unimpaired        Sensory (Neurological): Unimpaired        DTR: Patellar: deferred today Achilles: deferred today Plantar: deferred today  DTR: Patellar: deferred today Achilles: deferred today Plantar: deferred today  Palpation: No palpable anomalies  Palpation: No palpable anomalies   Assessment  Primary Diagnosis & Pertinent Problem List: The primary encounter diagnosis was Chronic musculoskeletal pain. Diagnoses of Trigger point with back pain and Muscle cramps were also pertinent to this visit.  Status Diagnosis  Controlled Controlled Controlled 1. Chronic musculoskeletal pain   2. Trigger point with back pain   3. Muscle cramps     Problems updated and reviewed during this visit: Problem  Chronic Upper Back Pain   Plan of Care  Pharmacotherapy (Medications Ordered): Meds ordered this encounter  Medications  . baclofen (LIORESAL) 10 MG tablet    Sig: Take 1 tablet (10 mg total) by mouth 4 (four) times daily.    Dispense:  120 tablet    Refill:  2    Do not place this medication, or any other prescription from our practice, on "Automatic Refill". Patient may have prescription filled one day early if pharmacy is closed on scheduled refill date.   Medications administered today: Yael Angerer. Dossantos had no medications administered during this visit.  Procedure Orders    No procedure(s) ordered today   Lab Orders  No laboratory test(s) ordered today   Imaging Orders  No imaging studies ordered today   Referral Orders  No referral(s) requested today   Interventional management options: Planned, scheduled, and/or pending: Gatorade, magnesium, tonic water at bedtime, and I will also prescribe some baclofen for him to try.  I will see him back in 2 weeks to see if this took  care of the problem. Therapeutic Revision and replacement of spinal cord stimulator with MRI compatible device. Copy of spinal cord stimulator ID indicates ANS system implanted by Dr. Ricke Hey  in 2003. 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   Considering: Removal of nonfunctional spinal cord stimulatorfollowed by an MRI of the lumbar spine with and without contrast. Diagnostic caudal epidural steroid injection + diagnostic epidurogram Possible Racz procedure   Palliative PRN treatment(s): Therapeuticbilateral lumbar facetRFA#2(Right done on 01/23/18; Left done on 11/21/17)   Provider-requested follow-up: Return in about 2 weeks (around 05/07/2018) for w/ Dr. Dossie Arbour.  Future Appointments  Date Time Provider [place]  05/07/2018  2:00 PM Milinda Pointer, MD [facility] None  05/17/2018  8:00 AM Vevelyn Francois, NP [facility] None   Primary Care Physician: Hoyt Koch, MD Location: [facility] Note by: Gaspar Cola, MD Date: 04/23/2018; Time: 10:35 AM

## 2018-04-19 NOTE — Telephone Encounter (Signed)
Pt called and stated the had TPI last week and isn't getting any relief and wants to know what he should do.

## 2018-04-23 ENCOUNTER — Other Ambulatory Visit: Payer: Self-pay

## 2018-04-23 ENCOUNTER — Ambulatory Visit: Payer: No Typology Code available for payment source | Attending: Pain Medicine | Admitting: Pain Medicine

## 2018-04-23 ENCOUNTER — Encounter: Payer: Self-pay | Admitting: Pain Medicine

## 2018-04-23 VITALS — BP 147/91 | HR 58 | Temp 98.1°F | Resp 18 | Ht 70.0 in | Wt 200.0 lb

## 2018-04-23 DIAGNOSIS — R252 Cramp and spasm: Secondary | ICD-10-CM | POA: Diagnosis present

## 2018-04-23 DIAGNOSIS — M549 Dorsalgia, unspecified: Secondary | ICD-10-CM | POA: Diagnosis present

## 2018-04-23 DIAGNOSIS — G8929 Other chronic pain: Secondary | ICD-10-CM | POA: Diagnosis not present

## 2018-04-23 DIAGNOSIS — M7918 Myalgia, other site: Secondary | ICD-10-CM | POA: Insufficient documentation

## 2018-04-23 MED ORDER — BACLOFEN 10 MG PO TABS: 10.0000 mg | ORAL_TABLET | Freq: Four times a day (QID) | ORAL | 2 refills | Status: DC

## 2018-04-23 NOTE — Progress Notes (Signed)
Safety precautions to be maintained throughout the outpatient stay will include: orient to surroundings, keep bed in low position, maintain call bell within reach at all times, provide assistance with transfer out of bed and ambulation.  

## 2018-04-23 NOTE — Patient Instructions (Addendum)
Rx for Baclofen with 2 refills has been escribed to to your pharmacy.____________________________________________________________________________________________  Muscle Spasms & Cramps  Cause:  The most common cause of muscle spasms and cramps is vitamin and/or electrolyte (calcium, potassium, sodium, etc.) deficiencies.  Possible triggers: Sweating - causes loss of electrolytes thru the skin. Steroids - causes loss of electrolytes thru the urine.  Treatment: 1. Gatorade (or any other electrolyte-replenishing drink) - Take 1, 8 oz glass with each meal (3 times a day). 2. OTC (over-the-counter) Magnesium 400 to 500 mg - Take 1 tablet twice a day (one with breakfast and one before bedtime). If you have kidney problems, talk to your primary care physician before taking any Magnesium. 3. Tonic Water with quinine - Take 1, 8 oz glass before bedtime.   ____________________________________________________________________________________________

## 2018-05-07 ENCOUNTER — Ambulatory Visit: Payer: No Typology Code available for payment source | Attending: Pain Medicine | Admitting: Pain Medicine

## 2018-05-07 ENCOUNTER — Other Ambulatory Visit: Payer: Self-pay

## 2018-05-07 ENCOUNTER — Encounter: Payer: Self-pay | Admitting: Pain Medicine

## 2018-05-07 VITALS — BP 137/86 | HR 63 | Temp 97.7°F | Resp 18 | Ht 70.0 in | Wt 200.0 lb

## 2018-05-07 DIAGNOSIS — T85192S Other mechanical complication of implanted electronic neurostimulator (electrode) of spinal cord, sequela: Secondary | ICD-10-CM | POA: Diagnosis present

## 2018-05-07 DIAGNOSIS — M79605 Pain in left leg: Secondary | ICD-10-CM | POA: Insufficient documentation

## 2018-05-07 DIAGNOSIS — M47816 Spondylosis without myelopathy or radiculopathy, lumbar region: Secondary | ICD-10-CM | POA: Insufficient documentation

## 2018-05-07 DIAGNOSIS — M961 Postlaminectomy syndrome, not elsewhere classified: Secondary | ICD-10-CM | POA: Diagnosis present

## 2018-05-07 DIAGNOSIS — G8929 Other chronic pain: Secondary | ICD-10-CM | POA: Insufficient documentation

## 2018-05-07 DIAGNOSIS — M5442 Lumbago with sciatica, left side: Secondary | ICD-10-CM | POA: Diagnosis present

## 2018-05-07 DIAGNOSIS — M792 Neuralgia and neuritis, unspecified: Secondary | ICD-10-CM | POA: Insufficient documentation

## 2018-05-07 DIAGNOSIS — G894 Chronic pain syndrome: Secondary | ICD-10-CM | POA: Diagnosis present

## 2018-05-07 MED ORDER — OXYCODONE HCL 10 MG PO TABS: 10.0000 mg | ORAL_TABLET | Freq: Four times a day (QID) | ORAL | 0 refills | Status: DC | PRN

## 2018-05-07 MED ORDER — GABAPENTIN 300 MG PO CAPS: 300.0000 mg | ORAL_CAPSULE | Freq: Every day | ORAL | 2 refills | Status: DC

## 2018-05-07 MED ORDER — CELECOXIB 200 MG PO CAPS: 200.0000 mg | ORAL_CAPSULE | Freq: Two times a day (BID) | ORAL | 1 refills | Status: DC

## 2018-05-07 NOTE — Progress Notes (Signed)
Patient's Name: Lance Roman  MRN: [id number]  Referring Provider: Myrlene Brokerrawford, Elizabeth A, *  DOB: March 31, 1960  PCP: Myrlene Brokerrawford, Elizabeth A, MD  DOS: 05/07/2018  Note by: Oswaldo DoneFrancisco A Charlyne Robertshaw, MD  Service setting: Ambulatory outpatient  Specialty: Interventional Pain Management  Location: [facility] (AMB) [facility] Facility    Patient type: Established   HPI  Reason for Visit: Lance Roman is a 58 y.o. year old, male patient, who comes today with a chief complaint of Back Pain (low); Leg Pain (left, posterior to left heel); and Neck Pain (between shoulder blades) Last Appointment: He was last seen by me on 04/23/2018. Pain Assessment: Today, Lance Roman describes the severity of the Chronic pain as a 2 /10. He indicates the location/referral of the pain to be Back Lower/left leg and heel. Onset was: More than a month ago. The quality of pain is described as Aching, Burning, Sore. Temporal description, or timing of pain is: Constant. Possible modifying factors: rest, medications, heat. Lance Roman describes the pain effects on ADL as:  .  Mr. Kyllonen's  height is 5\' 10"  (1.778 m) and weight is 200 lb (90.7 kg). His oral temperature is 97.7 F (36.5 C). His blood pressure is 137/86 and his pulse is 63. His respiration is 18 and oxygen saturation is 99%.   The patient indicates that the medications have helped considerably.  He also admits that we could adjust the doses to improve the results.  ROS  Constitutional: Denies any fever or chills Gastrointestinal: No reported hemesis, hematochezia, vomiting, or acute GI distress Musculoskeletal: Denies any acute onset joint swelling, redness, loss of ROM, or weakness Neurological: No reported episodes of acute onset apraxia, aphasia, dysarthria, agnosia, amnesia, paralysis, loss of coordination, or loss of consciousness  Medication Review  Oxycodone HCl, baclofen, celecoxib, esomeprazole, gabapentin, ibuprofen, and olopatadine  History Review   Allergy: Lance Roman has No Known Allergies. Drug: Lance Roman  reports no history of drug use. Alcohol:  reports current alcohol use of about 3.0 standard drinks of alcohol per week. Tobacco:  reports that he has quit smoking. His smokeless tobacco use includes chew. Social: Lance Roman  reports that he has quit smoking. His smokeless tobacco use includes chew. He reports current alcohol use of about 3.0 standard drinks of alcohol per week. He reports that he does not use drugs. Medical:  has a past medical history of Acute postoperative pain (11/21/2017), Allergic rhinitis, Chronic pain syndrome, DDD (degenerative disc disease), lumbosacral, GERD (gastroesophageal reflux disease), Hyperlipidemia, Osteoarthritis, and S/P appendectomy. Surgical: Lance Roman  has a past surgical history that includes Appendectomy; repairs of bilateral ankle fractures; Spinal fusion (N/A); and Spinal cord stimulator implant (N/A). Family: family history includes Diabetes in his maternal grandmother; Heart disease in his father. Problem List: Lance Roman has Chronic low back pain (Primary Area of Pain) (Bilateral) (L>R); Chronic pain syndrome; Chronic lower extremity pain (Secondary Area of Pain) (Left); Chronic lumbar radicular pain (L5) (Left); Retrolisthesis (4-5 mm) (L3 over L4) (L2 over L3; Failed back surgical syndrome (Fusion L4-S1); Lumbar facet hypertrophy (Bilateral); Lumbar facet syndrome (Bilateral) (L>R); Lumbar facet osteoarthritis (Bilateral); Osteoarthritis of lumbar spine; Spondylosis without myelopathy or radiculopathy, lumbosacral region; Other specified dorsopathies, sacral and sacrococcygeal region; Chronic hip pain (Left); Chronic knee pain (Left); DDD (degenerative disc disease), lumbosacral; Neurogenic pain; Trigger point with back pain; Chronic upper back pain; and Chronic musculoskeletal pain on their pertinent problem list.  Lab Review  Kidney Function Lab Results  Component Value Date  BUN 20  12/12/2016   CREATININE 0.74 (L) 12/12/2016   BCR 27 (H) 12/12/2016   GFRAA 120 12/12/2016   GFRNONAA 104 12/12/2016  Liver Function Lab Results  Component Value Date   AST 21 12/12/2016   ALT 12 12/30/2014   ALBUMIN 4.5 12/12/2016  Note: Above Lab results reviewed.  Imaging Review  DG C-Arm 1-60 Min-No Report Fluoroscopy was utilized by the requesting physician.  No radiographic  interpretation.  Note: Above imaging results reviewed.        Physical Exam  General appearance: Well nourished, well developed, and well hydrated. In no apparent acute distress Mental status: Alert, oriented x 3 (person, place, & time)       Respiratory: No evidence of acute respiratory distress Eyes: PERLA Vitals: BP 137/86   Pulse 63   Temp 97.7 F (36.5 C) (Oral)   Resp 18   Ht 5\' 10"  (1.778 m)   Wt 200 lb (90.7 kg)   SpO2 99%   BMI 28.70 kg/m  BMI: Estimated body mass index is 28.7 kg/m as calculated from the following:   Height as of this encounter: 5\' 10"  (1.778 m).   Weight as of this encounter: 200 lb (90.7 kg). Ideal: Ideal body weight: 73 kg (160 lb 15 oz) Adjusted ideal body weight: 80.1 kg (176 lb 9 oz)  Lumbar Spine Area Exam  Skin & Axial Inspection: Well healed scar from previous spine surgery detected Alignment: Symmetrical Functional ROM: Decreased ROM       Stability: No instability detected Muscle Tone/Strength: Functionally intact. No obvious neuro-muscular anomalies detected. Sensory (Neurological): Unimpaired Palpation: No palpable anomalies       Provocative Tests: Hyperextension/rotation test: deferred today       Lumbar quadrant test (Kemp's test): deferred today       Lateral bending test: deferred today       Patrick's Maneuver: deferred today                   FABER* test: deferred today                   S-I anterior distraction/compression test: deferred today         S-I lateral compression test: deferred today         S-I Thigh-thrust test: deferred  today         S-I Gaenslen's test: deferred today         *(Flexion, ABduction and External Rotation)  Assessment   Status Diagnosis  Controlled Controlled Controlled 1. Chronic low back pain (Primary Area of Pain) (Bilateral) (L>R)   2. Chronic lower extremity pain (Secondary Area of Pain) (Left)   3. Failed back surgical syndrome (Fusion L4-S1)   4. Spinal cord stimulator dysfunction (fractured leads)   5. Lumbar facet osteoarthritis (Bilateral)   6. Osteoarthritis of lumbar spine   7. Neurogenic pain   8. Chronic pain syndrome      Updated Problems: No problems updated.  Plan of Care  Medications: I have discontinued Serena C. Bergman's gabapentin. I have also changed his Oxycodone HCl and Oxycodone HCl. Additionally, I am having him start on gabapentin. Lastly, I am having him maintain his olopatadine, ibuprofen, esomeprazole, baclofen, and celecoxib.  Administered today: Shawntel Linde. Roylance had no medications administered during this visit.  Orders:  No orders of the defined types were placed in this encounter.  Interventional options: Planned follow-up:   Gatorade, magnesium, tonic water at bedtime, and I will also  prescribe some baclofen for him to try.  I will see him back in 2 weeks to see if this took care of the problem. Therapeutic Revision and replacement of spinal cord stimulator with MRI compatible device. Copy ofspinal cord stimulator IDindicatesANS system implanted by Dr. Lazarus SalinesStewart Malloy in 2003. 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 Return in about 2 months (around 07/06/2018) for Med-Mgmt w/ Dr. Laban EmperorNaveira before 07/22/18.Marland Kitchen.   Considering:   Removal of nonfunctional spinal cord stimulatorfollowed by an MRI of the lumbar spine with and without contrast. Diagnostic caudal epidural steroid injection+diagnostic epidurogram Possible Racz procedure   Palliative PRN treatment(s):   Therapeuticbilateral lumbar facetRFA#2(Right done on 01/23/18; Left done  on 11/21/17)   Note by: Oswaldo DoneFrancisco A Charlii Yost, MD Date: 05/07/2018; Time: 3:10 PM

## 2018-05-07 NOTE — Patient Instructions (Signed)
____________________________________________________________________________________________  Initial Gabapentin Titration  Medication used: Gabapentin (Generic Name) or Neurontin (Brand Name) 300 mg tablets/capsules  Reasons to stop increasing the dose:  Reason 1: You get good relief of symptoms, in which case there is no need to increase the daily dose any further.    Reason 2: You develop some side effects, such as sleeping all of the time, difficulty concentrating, or becoming disoriented, in which case you need to go down on the dose, to the prior level, where you were not experiencing any side effects. Stay on that dose longer, to allow more time for your body to get use it, before attempting to increase it again.   Steps: Step 1: Start by taking 1 (one) tablet at bedtime x 7 (seven) days.  Step 2: After being on 1 (one) tablet for 7 (seven) days, then increase it to 2 (two) tablets at bedtime for another 7 (seven) days.  Step 3: Next, after being on 2 (two) tablets at bedtime for 7 (seven) days, then increase it to 3 (three) tablets at bedtime, and stay on that dose until you see your doctor.  Reasons to stop increasing the dose: Reason 1: You get good relief of symptoms, in which case there is no need to increase the daily dose any further.  Reason 2: You develop some side effects, such as sleeping all of the time, difficulty concentrating, or becoming disoriented, in which case you need to go down on the dose, to the prior level, where you were not experiencing any side effects. Stay on that dose longer, to allow more time for your body to get use it, before attempting to increase it again.  Endpoint: Once you have reached the maximum dose you can tolerate without side-effects, contact your physician so as to evaluate the results of the regimen.   Questions: Feel free to contact us for any questions or problems at [phone] ____________________________________________________________________________________________   

## 2018-05-07 NOTE — Progress Notes (Signed)
Nursing Pain Medication Assessment:  Safety precautions to be maintained throughout the outpatient stay will include: orient to surroundings, keep bed in low position, maintain call bell within reach at all times, provide assistance with transfer out of bed and ambulation.  Medication Inspection Compliance: Lance Roman did not comply with our request to bring his pills to be counted. He was reminded that bringing the medication bottles, even when empty, is a requirement.  Medication: None brought in. Pill/Patch Count: None available to be counted. Bottle Appearance: No container available. Did not bring bottle(s) to appointment. Filled Date: N/A Last Medication intake:  Today

## 2018-05-17 ENCOUNTER — Encounter: Payer: No Typology Code available for payment source | Admitting: Nurse Practitioner

## 2018-07-16 ENCOUNTER — Ambulatory Visit: Payer: No Typology Code available for payment source | Attending: Pain Medicine | Admitting: Pain Medicine

## 2018-07-16 ENCOUNTER — Other Ambulatory Visit: Payer: Self-pay

## 2018-07-16 DIAGNOSIS — M961 Postlaminectomy syndrome, not elsewhere classified: Secondary | ICD-10-CM

## 2018-07-16 DIAGNOSIS — M5442 Lumbago with sciatica, left side: Secondary | ICD-10-CM | POA: Diagnosis not present

## 2018-07-16 DIAGNOSIS — M79605 Pain in left leg: Secondary | ICD-10-CM | POA: Diagnosis not present

## 2018-07-16 DIAGNOSIS — M549 Dorsalgia, unspecified: Secondary | ICD-10-CM

## 2018-07-16 DIAGNOSIS — M5137 Other intervertebral disc degeneration, lumbosacral region: Secondary | ICD-10-CM

## 2018-07-16 DIAGNOSIS — K219 Gastro-esophageal reflux disease without esophagitis: Secondary | ICD-10-CM

## 2018-07-16 DIAGNOSIS — G8929 Other chronic pain: Secondary | ICD-10-CM

## 2018-07-16 DIAGNOSIS — G894 Chronic pain syndrome: Secondary | ICD-10-CM

## 2018-07-16 DIAGNOSIS — T85192S Other mechanical complication of implanted electronic neurostimulator (electrode) of spinal cord, sequela: Secondary | ICD-10-CM

## 2018-07-16 DIAGNOSIS — M7918 Myalgia, other site: Secondary | ICD-10-CM

## 2018-07-16 DIAGNOSIS — M47816 Spondylosis without myelopathy or radiculopathy, lumbar region: Secondary | ICD-10-CM

## 2018-07-16 DIAGNOSIS — R252 Cramp and spasm: Secondary | ICD-10-CM

## 2018-07-16 DIAGNOSIS — M792 Neuralgia and neuritis, unspecified: Secondary | ICD-10-CM

## 2018-07-16 MED ORDER — ESOMEPRAZOLE MAGNESIUM 40 MG PO CPDR: 40.0000 mg | DELAYED_RELEASE_CAPSULE | Freq: Every day | ORAL | 0 refills | Status: DC

## 2018-07-16 MED ORDER — CELECOXIB 200 MG PO CAPS: 200.0000 mg | ORAL_CAPSULE | Freq: Two times a day (BID) | ORAL | 0 refills | Status: DC

## 2018-07-16 MED ORDER — GABAPENTIN 300 MG PO CAPS: 300.0000 mg | ORAL_CAPSULE | Freq: Every day | ORAL | 0 refills | Status: DC

## 2018-07-16 MED ORDER — BACLOFEN 10 MG PO TABS: 10.0000 mg | ORAL_TABLET | Freq: Four times a day (QID) | ORAL | 0 refills | Status: DC

## 2018-07-16 MED ORDER — OXYCODONE HCL 10 MG PO TABS: 10.0000 mg | ORAL_TABLET | Freq: Four times a day (QID) | ORAL | 0 refills | Status: DC | PRN

## 2018-07-16 NOTE — Patient Instructions (Signed)

## 2018-07-16 NOTE — Progress Notes (Signed)
Pain Management Virtual Encounter Note - Virtual Visit via Telephone Telehealth (real-time audio visits between healthcare provider and patient).  Patient's Phone No. & Preferred Pharmacy:  [phone] (home); [phone] (mobile); (Preferred) [phone] [email or web address]  [facility] - [place], [place] - [address] [place] [place] [address] Phone: [phone] Fax: [phone]   Pre-screening note:  Our staff contacted Lance Roman and offered him an "in person", "face-to-face" appointment versus a telephone encounter. He indicated preferring the telephone encounter, at this time.  Reason for Virtual Visit: COVID-19*  Social distancing based on CDC and AMA recommendations.   I contacted Lance Roman on 07/16/2018 at 9:05 AM via telephone and clearly identified myself as Lance Done, MD. I verified that I was speaking with the correct person using two identifiers (Name and date of birth: 03/06/1961).  Advanced Informed Consent I sought verbal advanced consent from Lance Roman for virtual visit interactions. I informed Lance Roman of possible security and privacy concerns, risks, and limitations associated with providing "not-in-person" medical evaluation and management services. I also informed Lance Roman of the availability of "in-person" appointments. Finally, I informed him that there would be a charge for the virtual visit and that he could be  personally, fully or partially, financially responsible for it. Lance Roman expressed understanding and agreed to proceed.   Historic Elements   Lance Roman is a 58 y.o. year old, male patient evaluated today after his last encounter by our practice on 05/07/2018. Lance Roman  has a past medical history of Acute postoperative pain (11/21/2017), Allergic rhinitis, Chronic pain syndrome, DDD (degenerative disc disease), lumbosacral, GERD (gastroesophageal reflux disease), Hyperlipidemia,  Osteoarthritis, and S/P appendectomy. He also  has a past surgical history that includes Appendectomy; repairs of bilateral ankle fractures; Spinal fusion (N/A); and Spinal cord stimulator implant (N/A). Lance Roman has a current medication list which includes the following prescription(s): ibuprofen, baclofen, celecoxib, esomeprazole, gabapentin, oxycodone hcl, oxycodone hcl, and oxycodone hcl. He  reports that he has quit smoking. His smokeless tobacco use includes chew. He reports current alcohol use of about 3.0 standard drinks of alcohol per week. He reports that he does not use drugs. Lance Roman has No Known Allergies.   HPI  I last saw him on 05/07/2018. He is being evaluated for medication management.  On the patient's last visit we started him on Gatorade, magnesium, tonic water at bedtime, and I will also prescribed some baclofen for him to try.  Pharmacotherapy Assessment  Analgesic: Oxycodone 10 mg 4 times daily MME/day: 60 mg/day.   Monitoring: Pharmacotherapy: No side-effects or adverse reactions reported. [place] PMP: PDMP not reviewed this encounter.       Compliance: No problems identified or detected. Plan: Refer to "POC".  Review of recent tests  DG C-Arm 1-60 Min-No Report Fluoroscopy was utilized by the requesting physician.  No radiographic  interpretation.    Clinical Support on 03/15/2018  Component Date Value Ref Range Status  . Summary 03/15/2018 FINAL   Final   Comment: ==================================================================== TOXASSURE SELECT 13 (MW) ==================================================================== Test                             Result       Flag       Units Drug Present and Declared for Prescription Verification   Oxycodone  4720         EXPECTED   ng/mg creat   Oxymorphone                    688          EXPECTED   ng/mg creat   Noroxycodone                   6555         EXPECTED   ng/mg creat   Noroxymorphone                  249          EXPECTED   ng/mg creat    Sources of oxycodone are scheduled prescription medications.    Oxymorphone, noroxycodone, and noroxymorphone are expected    metabolites of oxycodone. Oxymorphone is also available as a    scheduled prescription medication. ==================================================================== Test                      Result    Flag   Units      Ref Range   Creatinine              86               mg/dL      >=16 ======                          ============================================================== Declared Medications:  The flagging and interpretation on this report are based on the  following declared medications.  Unexpected results may arise from  inaccuracies in the declared medications.  **Note: The testing scope of this panel includes these medications:  Oxycodone  **Note: The testing scope of this panel does not include following  reported medications:  Celecoxib  Esomeprazole  Gabapentin  Ibuprofen  Olopatadine ==================================================================== For clinical consultation, please call [phone]. ====================================================================    Assessment  The primary encounter diagnosis was Chronic pain syndrome. Diagnoses of Chronic low back pain (Primary Area of Pain) (Bilateral) (L>R), Chronic lower extremity pain (Secondary Area of Pain) (Left), Failed back surgical syndrome (Fusion L4-S1), DDD (degenerative disc disease), lumbosacral, Chronic musculoskeletal pain, Trigger point with back pain, Muscle cramps, Gastroesophageal reflux disease without esophagitis, Lumbar facet osteoarthritis (Bilateral), Osteoarthritis of lumbar spine, Neurogenic pain, and Spinal cord stimulator dysfunction (fractured leads) were also pertinent to this visit.  Plan of Care  I have discontinued Lance Roman "Clyde"'s olopatadine. I have also changed his esomeprazole  and Oxycodone HCl. Additionally, I am having him maintain his ibuprofen, baclofen, Oxycodone HCl, Oxycodone HCl, celecoxib, and gabapentin.  Pharmacotherapy (Medications Ordered): Meds ordered this encounter  Medications  . baclofen (LIORESAL) 10 MG tablet    Sig: Take 1 tablet (10 mg total) by mouth 4 (four) times daily.    Dispense:  360 tablet    Refill:  0    Do not place this medication, or any other prescription from our practice, on "Automatic Refill". Patient may have prescription filled one day early if pharmacy is closed on scheduled refill date.  . Oxycodone HCl 10 MG TABS    Sig: Take 1 tablet (10 mg total) by mouth every 6 (six) hours as needed for up to 30 days. Must last 30 days.    Dispense:  120 tablet    Refill:  0    [place] STOP ACT - Not applicable to Chronic Pain Syndrome (  G89.4) diagnosis. Fill one day early if pharmacy is closed on scheduled refill date. Do not fill until: 08/21/18. To last until: 09/20/18.  Marland Kitchen. Oxycodone HCl 10 MG TABS    Sig: Take 1 tablet (10 mg total) by mouth every 6 (six) hours as needed for up to 30 days. Must last 30 days.    Dispense:  120 tablet    Refill:  0    [place] STOP ACT - Not applicable to Chronic Pain Syndrome (G89.4) diagnosis. Fill one day early if pharmacy is closed on scheduled refill date. Do not fill until: 07/22/18. To last until: 08/21/18.  Marland Kitchen. esomeprazole (NEXIUM) 40 MG capsule    Sig: Take 1 capsule (40 mg total) by mouth at bedtime.    Dispense:  90 capsule    Refill:  0    Do not place medication on "Automatic Refill". Fill one day early if pharmacy is closed on scheduled refill date.  . celecoxib (CELEBREX) 200 MG capsule    Sig: Take 1 capsule (200 mg total) by mouth 2 (two) times daily.    Dispense:  180 capsule    Refill:  0    Do not place medication on "Automatic Refill". Fill one day early if pharmacy is closed on scheduled refill date.  . gabapentin (NEURONTIN) 300 MG capsule    Sig: Take 1-3 capsules (300-900 mg  total) by mouth at bedtime.    Dispense:  270 capsule    Refill:  0    Do not place this medication, or any other prescription from our practice, on "Automatic Refill". Patient may have prescription filled one day early if pharmacy is closed on scheduled refill date.  . Oxycodone HCl 10 MG TABS    Sig: Take 1 tablet (10 mg total) by mouth every 6 (six) hours as needed for up to 30 days. Must last 30 days.    Dispense:  120 tablet    Refill:  0    [place] STOP ACT - Not applicable to Chronic Pain Syndrome (G89.4) diagnosis. Fill one day early if pharmacy is closed on scheduled refill date. Do not fill until: 09/20/18. To last until: 10/20/18.   Orders:  Orders Placed This Encounter  Procedures  . Ambulatory referral to Neurosurgery    Referral Priority:   Routine    Referral Type:   Surgical    Referral Reason:   Specialty Services Required    Referred to Provider:   Pa, [facility]    Requested Specialty:   Neurosurgery    Number of Visits Requested:   1   Follow-up plan:   Return in about 3 months (around 10/15/2018) for Med-Mgmt w/ NP.  Patient sent for a referral to [facility] for replacement of fractured spinal cord stimulator leads and battery.   Interventional options: Pending:   Therapeutic Revision and replacement of spinal cord stimulator with MRI compatible device. Copy ofspinal cord stimulator IDindicatesANS system implanted by Dr. Lazarus SalinesStewart Malloy in 2003. 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   Considering:   Removal of nonfunctional spinal cord stimulatorfollowed by an MRI of the lumbar spine with and without contrast. Diagnostic caudal epidural steroid injection+diagnostic epidurogram Possible Racz procedure   Palliative PRN treatment(s):   Therapeuticbilateral lumbar facetRFA#2(Right Roman on 01/23/18; Left Roman on 11/21/17)   I discussed the assessment and treatment plan with the patient. The patient was provided an  opportunity to ask questions and all were answered. The patient agreed with the plan and  demonstrated an understanding of the instructions.  Patient advised to call back or seek an in-person evaluation if the symptoms or condition worsens.  Total duration of non-face-to-face encounter: 15 minutes.  Note by: Lance Done, MD Date: 07/16/2018; Time: 9:05 AM  Disclaimer:  * Given the special circumstances of the COVID-19 pandemic, the federal government has announced that the Office for Civil Rights (OCR) will exercise its enforcement discretion and will not impose penalties on physicians using telehealth in the event of noncompliance with regulatory requirements under the [employer] Portability and Accountability Act (HIPAA) in connection with the good faith provision of telehealth during the COVID-19 national public health emergency. (AMA)

## 2018-07-18 ENCOUNTER — Ambulatory Visit: Payer: No Typology Code available for payment source | Admitting: Pain Medicine

## 2018-08-13 ENCOUNTER — Encounter: Payer: Self-pay | Admitting: Internal Medicine

## 2018-08-13 ENCOUNTER — Ambulatory Visit (INDEPENDENT_AMBULATORY_CARE_PROVIDER_SITE_OTHER): Payer: BC Managed Care – PPO | Admitting: Internal Medicine

## 2018-08-13 DIAGNOSIS — R21 Rash and other nonspecific skin eruption: Secondary | ICD-10-CM | POA: Diagnosis not present

## 2018-08-13 MED ORDER — PREDNISONE 20 MG PO TABS: 40.0000 mg | ORAL_TABLET | Freq: Every day | ORAL | 0 refills | Status: DC

## 2018-08-13 MED ORDER — TRIAMCINOLONE ACETONIDE 0.1 % EX CREA: 1.0000 "application " | TOPICAL_CREAM | Freq: Two times a day (BID) | CUTANEOUS | 0 refills | Status: DC

## 2018-08-13 NOTE — Assessment & Plan Note (Signed)
Rx for prednisone and triamcinolone ointment.

## 2018-08-13 NOTE — Progress Notes (Signed)
Virtual Visit via Video Note  I connected with Lance Roman on 08/13/18 at 10:40 AM EDT by a video enabled telemedicine application and verified that I am speaking with the correct person using two identifiers.  The patient and the provider were at separate locations throughout the entire encounter.   I discussed the limitations of evaluation and management by telemedicine and the availability of in person appointments. The patient expressed understanding and agreed to proceed.  History of Present Illness: The patient is a 58 y.o. man with visit for rash and concern or poison ivy. He was working outdoors at the onset. Denies fevers or chills. Was on his legs and he has been using tea tree oil which helps temporarily. Denies blistering currently. Some itching and no pain. Does have some on the arms and legs currently. Started about 3 weeks ago originally and he is tired of it. Has not tried benadryl for itching. Denies cough or SOB. Overall it is waxing and waning. Has tried otc ointment  Observations/Objective: Appearance: normal, breathing appears normal, work grooming, abdomen does not appear distended, throat normal, some red rash on the forearms without blistering, mental status is A and O times 3  Assessment and Plan: See problem oriented charting  Follow Up Instructions: rx prednisone and triamcinolone cream.  I discussed the assessment and treatment plan with the patient. The patient was provided an opportunity to ask questions and all were answered. The patient agreed with the plan and demonstrated an understanding of the instructions.   The patient was advised to call back or seek an in-person evaluation if the symptoms worsen or if the condition fails to improve as anticipated.  Myrlene Broker, MD

## 2018-08-23 ENCOUNTER — Encounter: Payer: Self-pay | Admitting: Internal Medicine

## 2018-08-23 ENCOUNTER — Ambulatory Visit (INDEPENDENT_AMBULATORY_CARE_PROVIDER_SITE_OTHER): Payer: BC Managed Care – PPO | Admitting: Internal Medicine

## 2018-08-23 DIAGNOSIS — R21 Rash and other nonspecific skin eruption: Secondary | ICD-10-CM

## 2018-08-23 MED ORDER — PREDNISONE 20 MG PO TABS: 40.0000 mg | ORAL_TABLET | Freq: Every day | ORAL | 0 refills | Status: DC

## 2018-08-23 MED ORDER — FAMOTIDINE 20 MG PO TABS: 20.0000 mg | ORAL_TABLET | Freq: Two times a day (BID) | ORAL | 0 refills | Status: DC

## 2018-08-23 NOTE — Progress Notes (Signed)
Virtual Visit via Video Note  I connected with Lance Roman on 08/23/18 at  3:20 PM EDT by a video enabled telemedicine application and verified that I am speaking with the correct person using two identifiers.  The patient and the provider were at separate locations throughout the entire encounter.   I discussed the limitations of evaluation and management by telemedicine and the availability of in person appointments. The patient expressed understanding and agreed to proceed.  History of Present Illness: The patient is a 58 y.o. man with visit for rash. Seen about 10 days ago for rash he felt was poison ivy related. Given short course prednisone and triamcinolone ointment which he took. Still using cream. The rash overall improved and the itching was gone on the prednisone. Since stopping had about 2 days of no itching and no rash. About 2 days ago he started itching again and then got rash on the biceps region both sides. Rash on legs is gone and forearms is gone. No change in soap, shampoo, detergent. No new medications or otc medications. No change in dose of medications. Has no fevers or chills. No outdoor exposure of poison ivy/oak.  Observations/Objective: Appearance: normal, breathing appears normal, casual grooming, abdomen does not appear distended, throat normal, some scratching during exam to arms, mental status is A and O times 3, unable to appreciate rash well on exam but some red lesion on the biceps region  Assessment and Plan: See problem oriented charting  Follow Up Instructions: rx prednisone for 1 week and also pepcid for the itching  I discussed the assessment and treatment plan with the patient. The patient was provided an opportunity to ask questions and all were answered. The patient agreed with the plan and demonstrated an understanding of the instructions.   The patient was advised to call back or seek an in-person evaluation if the symptoms worsen or if the condition  fails to improve as anticipated.  Myrlene Broker, MD

## 2018-08-23 NOTE — Assessment & Plan Note (Signed)
Rx for prednisone 1 week and pepcid 20 mg BID. Advised to avoid scratching. Could potentially be coming from medication or exposure.

## 2018-09-03 ENCOUNTER — Encounter: Payer: Self-pay | Admitting: Internal Medicine

## 2018-09-03 ENCOUNTER — Other Ambulatory Visit: Payer: Self-pay

## 2018-09-03 ENCOUNTER — Ambulatory Visit (INDEPENDENT_AMBULATORY_CARE_PROVIDER_SITE_OTHER): Payer: BC Managed Care – PPO | Admitting: Internal Medicine

## 2018-09-03 DIAGNOSIS — R21 Rash and other nonspecific skin eruption: Secondary | ICD-10-CM

## 2018-09-03 MED ORDER — HYDROXYZINE HCL 25 MG PO TABS: 25.0000 mg | ORAL_TABLET | Freq: Three times a day (TID) | ORAL | 0 refills | Status: DC | PRN

## 2018-09-03 MED ORDER — PREDNISONE 20 MG PO TABS: ORAL_TABLET | ORAL | 0 refills | Status: DC

## 2018-09-03 NOTE — Progress Notes (Signed)
   Subjective:   Patient ID: Lance Roman, male    DOB: [birth year], 58 y.o.   MRN: [id number]  HPI The patient is a 58 YO man coming in for ongoing rash. Thought originally he had been exposed to poison ivy and got 5 day prednisone. This helped and then the itching and rash came back. He was given 1 week prednisone and took this. Stopped taking this about 08/30/18 and the itching came back about 1-2 days later. He is scratching at night and sometimes during the day. Tried benadryl for night time itching but this did not seem to do much good. He is using otc topicals and the prescribed topicals which are not helping much. Denies fevers or chills. Denies outdoor exposure. He did not try the pepcid we prescribed last time to see if that could help with itching.   Review of Systems  Constitutional: Negative.   HENT: Negative.   Eyes: Negative.   Respiratory: Negative for cough, chest tightness and shortness of breath.   Cardiovascular: Negative for chest pain, palpitations and leg swelling.  Gastrointestinal: Negative for abdominal distention, abdominal pain, constipation, diarrhea, nausea and vomiting.  Musculoskeletal: Negative.   Skin: Positive for rash.  Neurological: Negative.   Psychiatric/Behavioral: Negative.     Objective:  Physical Exam Constitutional:      Appearance: He is well-developed.  HENT:     Head: Normocephalic and atraumatic.  Neck:     Musculoskeletal: Normal range of motion.  Cardiovascular:     Rate and Rhythm: Normal rate and regular rhythm.  Pulmonary:     Effort: Pulmonary effort is normal. No respiratory distress.     Breath sounds: Normal breath sounds. No wheezing or rales.  Abdominal:     General: Bowel sounds are normal. There is no distension.     Palpations: Abdomen is soft.     Tenderness: There is no abdominal tenderness. There is no rebound.  Skin:    General: Skin is warm and dry.     Findings: Rash present.     Comments: Rash on the left  chest and left arm without blistering or weeping, patient scratching during visit  Neurological:     Mental Status: He is alert and oriented to person, place, and time.     Coordination: Coordination normal.     Vitals:   09/03/18 1559  BP: 126/90  Pulse: 71  Temp: 98.4 F (36.9 C)  TempSrc: Oral  SpO2: 98%  Weight: 226 lb (102.5 kg)  Height: 5\' 10"  (1.778 m)    Assessment & Plan:  Visit time 25 minutes: greater than 50% of that time was spent in face to face counseling and coordination of care with the patient: counseled about the nature of itching and rashes and the strong need to not itch and various treatments to use for itching.

## 2018-09-03 NOTE — Patient Instructions (Signed)
We have sent in the prednisone to take 2 pills daily for 1 week, then take 1 pill daily for 1 week then stop.   We have also sent in a medicine called hydroxyzine to use for itching if needed.   Work on not scratching as this will help the itching going.

## 2018-09-04 NOTE — Assessment & Plan Note (Signed)
At this time it is not likely to be poison ivy and suspect that this is caused by the scratching. We talked about how important it is not to scratch at all. Still advised filling the pepcid to take to reduce itching. Rx hydroxyzine to help with night time itching. 2 week prednisone course.

## 2018-09-19 ENCOUNTER — Other Ambulatory Visit: Payer: Self-pay | Admitting: Pain Medicine

## 2018-09-19 DIAGNOSIS — G894 Chronic pain syndrome: Secondary | ICD-10-CM

## 2018-10-09 ENCOUNTER — Encounter: Payer: Self-pay | Admitting: Pain Medicine

## 2018-10-09 NOTE — Progress Notes (Signed)
Pain Management Virtual Encounter Note - Virtual Visit via Telephone Telehealth (real-time audio visits between healthcare provider and patient).   Patient's Phone No. & Preferred Pharmacy:  [phone] (home); [phone] (mobile); (Preferred) [phone] [email or web address]  [address], [place] [address] Phone: [phone] Fax: [phone]    Pre-screening note:  Our staff contacted Lance Roman and offered him an "in person", "face-to-face" appointment versus a telephone encounter. He indicated preferring the telephone encounter, at this time.   Reason for Virtual Visit: COVID-19*  Social distancing based on CDC and AMA recommendations.   I contacted Lance Roman on 10/10/2018 via telephone.      I clearly identified myself as Gaspar Cola, MD. I verified that I was speaking with the correct person using two identifiers (Name: Lance Roman, and date of birth: [birth year]).  Advanced Informed Consent I sought verbal advanced consent from [name] for virtual visit interactions. I informed Lance Roman of possible security and privacy concerns, risks, and limitations associated with providing "not-in-person" medical evaluation and management services. I also informed Lance Roman of the availability of "in-person" appointments. Finally, I informed him that there would be a charge for the virtual visit and that he could be  personally, fully or partially, financially responsible for it. Lance Roman expressed understanding and agreed to proceed.   Historic Elements   Lance Roman is a 58 y.o. year old, male patient evaluated today after his last encounter by our practice on 09/19/2018. Lance Roman  has a past medical history of Acute postoperative pain (11/21/2017), Allergic rhinitis, Chronic pain syndrome, DDD (degenerative disc disease), lumbosacral, GERD (gastroesophageal reflux disease),  Hyperlipidemia, Osteoarthritis, and S/P appendectomy. He also  has a past surgical history that includes Appendectomy; repairs of bilateral ankle fractures; Spinal fusion (N/A); and Spinal cord stimulator implant (N/A). Lance Roman has a current medication list which includes the following prescription(s): baclofen, celecoxib, esomeprazole, hydroxyzine, ibuprofen, oxycodone hcl, oxycodone hcl, oxycodone hcl, and pregabalin. He  reports that he has quit smoking. His smokeless tobacco use includes chew. He reports current alcohol use of about 3.0 standard drinks of alcohol per week. He reports that he does not use drugs. Lance Roman has No Known Allergies.   HPI  Today, he is being contacted for medication management.  Currently the patient is not experiencing any problems with most of his medicine.  However, he was unable to tolerate the gabapentin.  He indicates that it made him too sleepy.  Because of this, today we will discontinue the medication and will go with a Lyrica trial at 25 mg p.o. at bedtime.  We will have him take that for 30 days and then will increase it to twice daily.  We will continue to do this slowly until we can have him comfortable or we reach a maximum of 450 mg/day.  Unfortunately, he was not taking his gabapentin correctly and today I have stressed the fact that it is a medicine that has to be taken on a daily basis in order for him to build up appropriate level so as to appropriately treat the neuropathic component of his pain.  He keeps trying to get that pain under control using ibuprofen or the opioids.  He keeps bringing him back the morphine, which I will never go back to, due to the patient's tolerance.  Furthermore I am concerned that he is already beginning to develop tolerance to  the oxycodone since she appears not to be satisfied with his current dose.  The solution to this is to maximize the membrane stabilizer and also to fix his spinal cord stimulator.  Today he commented  that he is feeling pressured from [employer] to settle his case.  Unfortunately, his spinal cord stimulator has not been fixed at this is an issue that has to be addressed before he makes a decision about his case.  The only good thing that would come from settling his Worker's Compensation case is that most studies have shown that patients tend not to improve until the [employer] case is solved.  I have put some orders to have my staff check on the referral for the spinal cord stimulator revision and replacement.  If we continue to have problems with getting in this referral, then we will go ahead and take it upon herself to schedule him for the replacement.  I have quite a bit of experience with spinal cord stimulators and implantable devices, but as I grew older I was trying to move away from doing this type of interventional procedures, due to the amount of wasted time that we encounter in the ORs.   Pharmacotherapy Assessment  Analgesic: Oxycodone IR 10 mg, 1 tab PO q 6 hrs (40 mg/day of oxycodone) MME/day:60mg /day.   Monitoring: Pharmacotherapy: No side-effects or adverse reactions reported. [place] PMP: PDMP reviewed during this encounter.       Compliance: No problems identified. Effectiveness: Clinically acceptable. Plan: Refer to "POC".  Pertinent Labs   SAFETY SCREENING Profile Lab Results  Component Value Date   HCVAB NEGATIVE 12/30/2014   Renal Function Lab Results  Component Value Date   BUN 20 12/12/2016   CREATININE 0.74 (L) 12/12/2016   BCR 27 (H) 12/12/2016   GFRAA 120 12/12/2016   GFRNONAA 104 12/12/2016   Hepatic Function Lab Results  Component Value Date   AST 21 12/12/2016   ALT 12 12/30/2014   ALBUMIN 4.5 12/12/2016   UDS Summary  Date Value Ref Range Status  03/15/2018 FINAL  Final    Comment:    ==================================================================== TOXASSURE SELECT 13  (MW) ==================================================================== Test                             Result       Flag       Units Drug Present and Declared for Prescription Verification   Oxycodone                      4720         EXPECTED   ng/mg creat   Oxymorphone                    688          EXPECTED   ng/mg creat   Noroxycodone                   6555         EXPECTED   ng/mg creat   Noroxymorphone                 249          EXPECTED   ng/mg creat    Sources of oxycodone are scheduled prescription medications.    Oxymorphone, noroxycodone, and noroxymorphone are expected    metabolites of oxycodone. Oxymorphone is also available as a  scheduled prescription medication. ==================================================================== Test                      Result    Flag   Units      Ref Range   Creatinine              86               mg/dL      >=16>=20 ==================================================================== Declared Medications:  The flagging and interpretation on this report are based on the  following declared medications.  Unexpected results may arise from  inaccuracies in the declared medications.  **Note: The testing scope of this panel includes these medications:  Oxycodone  **Note: The testing scope of this panel does not include following  reported medications:  Celecoxib  Esomeprazole  Gabapentin  Ibuprofen  Olopatadine ==================================================================== For clinical consultation, please call [phone]. ====================================================================    Note: Above Lab results reviewed.  Recent imaging  DG C-Arm 1-60 Min-No Report Fluoroscopy was utilized by the requesting physician.  No radiographic  interpretation.   Assessment  The primary encounter diagnosis was Spinal cord stimulator dysfunction (fractured leads). Diagnoses of Chronic musculoskeletal pain, Trigger  point with back pain, Muscle cramps, Gastroesophageal reflux disease without esophagitis, Lumbar facet osteoarthritis (Bilateral), Osteoarthritis of lumbar spine, Neurogenic pain, and Chronic pain syndrome were also pertinent to this visit.  Plan of Care  I have discontinued Lance DeterSamuel C. Koegel "Clyde"'s Oxycodone HCl, Oxycodone HCl, gabapentin, triamcinolone cream, and predniSONE. I have also changed his Oxycodone HCl. Additionally, I am having him start on pregabalin, Oxycodone HCl, and Oxycodone HCl. Lastly, I am having him maintain his ibuprofen, hydrOXYzine, baclofen, esomeprazole, and celecoxib.  Pharmacotherapy (Medications Ordered): Meds ordered this encounter  Medications  . baclofen (LIORESAL) 10 MG tablet    Sig: Take 1 tablet (10 mg total) by mouth 4 (four) times daily.    Dispense:  360 tablet    Refill:  0    Fill one day early if pharmacy is closed on scheduled refill date. May substitute for generic if available.  . esomeprazole (NEXIUM) 40 MG capsule    Sig: Take 1 capsule (40 mg total) by mouth at bedtime.    Dispense:  90 capsule    Refill:  0    Fill one day early if pharmacy is closed on scheduled refill date. May substitute for generic if available.  . celecoxib (CELEBREX) 200 MG capsule    Sig: Take 1 capsule (200 mg total) by mouth 2 (two) times daily.    Dispense:  180 capsule    Refill:  0    Do not place medication on "Automatic Refill". Fill one day early if pharmacy is closed on scheduled refill date.  . Oxycodone HCl 10 MG TABS    Sig: Take 1 tablet (10 mg total) by mouth every 6 (six) hours as needed. Must last 30 days.    Dispense:  120 tablet    Refill:  0    Chronic Pain: STOP Act (Not applicable) Fill 1 day early if closed on refill date. Do not fill until: 10/20/2018. To last until: 11/19/2018. Avoid benzodiazepines within 8 hours of opioids  . pregabalin (LYRICA) 25 MG capsule    Sig: Take 1 capsule (25 mg total) by mouth at bedtime for 30 days, THEN 1  capsule (25 mg total) 2 (two) times daily.    Dispense:  150 capsule    Refill:  0  Fill one day early if pharmacy is closed on scheduled refill date. May substitute for generic if available.  . Oxycodone HCl 10 MG TABS    Sig: Take 1 tablet (10 mg total) by mouth every 6 (six) hours as needed. Must last 30 days.    Dispense:  120 tablet    Refill:  0    Chronic Pain: STOP Act (Not applicable) Fill 1 day early if closed on refill date. Do not fill until: 11/19/2018. To last until: 12/19/2018. Avoid benzodiazepines within 8 hours of opioids  . Oxycodone HCl 10 MG TABS    Sig: Take 1 tablet (10 mg total) by mouth every 6 (six) hours as needed. Must last 30 days.    Dispense:  120 tablet    Refill:  0    Chronic Pain: STOP Act (Not applicable) Fill 1 day early if closed on refill date. Do not fill until: 12/19/2018. To last until: 01/18/2019. Avoid benzodiazepines within 8 hours of opioids   Orders:  No orders of the defined types were placed in this encounter.  Follow-up plan:   Return in about 14 weeks (around 01/16/2019) for (VV), E/M (MM) evaluation and adjustment done the Lyrica trial.      Interventional options:  Considering:   Revision of replacement of nonfunctional SCSfollowed by an MRI of the lumbar spine with and without contrast. Diagnostic caudal epidural steroid injection+diagnostic epidurogram Possible Racz procedure   Palliative PRN treatment(s):   Therapeuticbilateral lumbar facetRFA#2(Right done on 01/23/18; Left done on 11/21/17)    Recent Visits Date Type Provider Dept  07/16/18 Office Visit Delano MetzNaveira, Jaclene Bartelt, MD [facility]  Showing recent visits within past 90 days and meeting all other requirements   Today's Visits Date Type Provider Dept  10/10/18 Office Visit Delano MetzNaveira, Clora Ohmer, MD [facility]  Showing today's visits and meeting all other requirements   Future Appointments No visits were found meeting these conditions.   Showing future appointments within next 90 days and meeting all other requirements   I discussed the assessment and treatment plan with the patient. The patient was provided an opportunity to ask questions and all were answered. The patient agreed with the plan and demonstrated an understanding of the instructions.  Patient advised to call back or seek an in-person evaluation if the symptoms or condition worsens.  Total duration of non-face-to-face encounter: 20 minutes.  Note by: Oswaldo DoneFrancisco A Aolanis Crispen, MD Date: 10/10/2018; Time: 3:40 PM  Note: This dictation was prepared with Dragon dictation. Any transcriptional errors that may result from this process are unintentional.  Disclaimer:  * Given the special circumstances of the COVID-19 pandemic, the federal government has announced that the Office for Civil Rights (OCR) will exercise its enforcement discretion and will not impose penalties on physicians using telehealth in the event of noncompliance with regulatory requirements under the [employer] Insurance Portability and Accountability Act (HIPAA) in connection with the good faith provision of telehealth during the COVID-19 national public health emergency. (AMA)

## 2018-10-10 ENCOUNTER — Ambulatory Visit: Payer: No Typology Code available for payment source | Attending: Pain Medicine | Admitting: Pain Medicine

## 2018-10-10 ENCOUNTER — Other Ambulatory Visit: Payer: Self-pay

## 2018-10-10 DIAGNOSIS — R252 Cramp and spasm: Secondary | ICD-10-CM

## 2018-10-10 DIAGNOSIS — M7918 Myalgia, other site: Secondary | ICD-10-CM

## 2018-10-10 DIAGNOSIS — M792 Neuralgia and neuritis, unspecified: Secondary | ICD-10-CM

## 2018-10-10 DIAGNOSIS — M47816 Spondylosis without myelopathy or radiculopathy, lumbar region: Secondary | ICD-10-CM

## 2018-10-10 DIAGNOSIS — G8929 Other chronic pain: Secondary | ICD-10-CM

## 2018-10-10 DIAGNOSIS — T85192S Other mechanical complication of implanted electronic neurostimulator (electrode) of spinal cord, sequela: Secondary | ICD-10-CM

## 2018-10-10 DIAGNOSIS — M549 Dorsalgia, unspecified: Secondary | ICD-10-CM

## 2018-10-10 DIAGNOSIS — K219 Gastro-esophageal reflux disease without esophagitis: Secondary | ICD-10-CM

## 2018-10-10 DIAGNOSIS — G894 Chronic pain syndrome: Secondary | ICD-10-CM

## 2018-10-10 MED ORDER — OXYCODONE HCL 10 MG PO TABS: 10.0000 mg | ORAL_TABLET | Freq: Four times a day (QID) | ORAL | 0 refills | Status: DC | PRN

## 2018-10-10 MED ORDER — ESOMEPRAZOLE MAGNESIUM 40 MG PO CPDR: 40.0000 mg | DELAYED_RELEASE_CAPSULE | Freq: Every day | ORAL | 0 refills | Status: DC

## 2018-10-10 MED ORDER — PREGABALIN 25 MG PO CAPS: ORAL_CAPSULE | ORAL | 0 refills | Status: DC

## 2018-10-10 MED ORDER — BACLOFEN 10 MG PO TABS: 10.0000 mg | ORAL_TABLET | Freq: Four times a day (QID) | ORAL | 0 refills | Status: DC

## 2018-10-10 MED ORDER — CELECOXIB 200 MG PO CAPS: 200.0000 mg | ORAL_CAPSULE | Freq: Two times a day (BID) | ORAL | 0 refills | Status: DC

## 2018-12-25 ENCOUNTER — Encounter: Payer: Self-pay | Admitting: *Deleted

## 2018-12-25 ENCOUNTER — Encounter: Payer: Self-pay | Admitting: Pain Medicine

## 2018-12-25 DIAGNOSIS — M792 Neuralgia and neuritis, unspecified: Secondary | ICD-10-CM

## 2018-12-25 DIAGNOSIS — T85192S Other mechanical complication of implanted electronic neurostimulator (electrode) of spinal cord, sequela: Secondary | ICD-10-CM

## 2018-12-25 MED ORDER — PREGABALIN 25 MG PO CAPS: 25.0000 mg | ORAL_CAPSULE | Freq: Two times a day (BID) | ORAL | 1 refills | Status: DC

## 2019-01-07 ENCOUNTER — Other Ambulatory Visit: Payer: Self-pay | Admitting: Pain Medicine

## 2019-01-07 DIAGNOSIS — K219 Gastro-esophageal reflux disease without esophagitis: Secondary | ICD-10-CM

## 2019-01-07 DIAGNOSIS — M549 Dorsalgia, unspecified: Secondary | ICD-10-CM

## 2019-01-07 DIAGNOSIS — M7918 Myalgia, other site: Secondary | ICD-10-CM

## 2019-01-07 DIAGNOSIS — G8929 Other chronic pain: Secondary | ICD-10-CM

## 2019-01-07 DIAGNOSIS — M47816 Spondylosis without myelopathy or radiculopathy, lumbar region: Secondary | ICD-10-CM

## 2019-01-07 DIAGNOSIS — R252 Cramp and spasm: Secondary | ICD-10-CM

## 2019-01-16 ENCOUNTER — Ambulatory Visit: Payer: No Typology Code available for payment source | Attending: Pain Medicine | Admitting: Pain Medicine

## 2019-01-16 ENCOUNTER — Other Ambulatory Visit: Payer: Self-pay

## 2019-01-16 ENCOUNTER — Encounter: Payer: Self-pay | Admitting: Pain Medicine

## 2019-01-16 DIAGNOSIS — G8929 Other chronic pain: Secondary | ICD-10-CM

## 2019-01-16 DIAGNOSIS — M47816 Spondylosis without myelopathy or radiculopathy, lumbar region: Secondary | ICD-10-CM

## 2019-01-16 DIAGNOSIS — M7918 Myalgia, other site: Secondary | ICD-10-CM

## 2019-01-16 DIAGNOSIS — M5442 Lumbago with sciatica, left side: Secondary | ICD-10-CM

## 2019-01-16 DIAGNOSIS — M4310 Spondylolisthesis, site unspecified: Secondary | ICD-10-CM | POA: Diagnosis not present

## 2019-01-16 DIAGNOSIS — G894 Chronic pain syndrome: Secondary | ICD-10-CM | POA: Diagnosis not present

## 2019-01-16 DIAGNOSIS — K219 Gastro-esophageal reflux disease without esophagitis: Secondary | ICD-10-CM

## 2019-01-16 DIAGNOSIS — M961 Postlaminectomy syndrome, not elsewhere classified: Secondary | ICD-10-CM

## 2019-01-16 DIAGNOSIS — R252 Cramp and spasm: Secondary | ICD-10-CM

## 2019-01-16 DIAGNOSIS — M792 Neuralgia and neuritis, unspecified: Secondary | ICD-10-CM

## 2019-01-16 DIAGNOSIS — Z79899 Other long term (current) drug therapy: Secondary | ICD-10-CM

## 2019-01-16 DIAGNOSIS — T85192S Other mechanical complication of implanted electronic neurostimulator (electrode) of spinal cord, sequela: Secondary | ICD-10-CM

## 2019-01-16 DIAGNOSIS — M79605 Pain in left leg: Secondary | ICD-10-CM

## 2019-01-16 MED ORDER — BACLOFEN 10 MG PO TABS: ORAL_TABLET | ORAL | 5 refills | Status: DC

## 2019-01-16 MED ORDER — OXYCODONE HCL 10 MG PO TABS: 10.0000 mg | ORAL_TABLET | Freq: Four times a day (QID) | ORAL | 0 refills | Status: DC | PRN

## 2019-01-16 MED ORDER — GABAPENTIN 300 MG PO CAPS: 600.0000 mg | ORAL_CAPSULE | Freq: Three times a day (TID) | ORAL | 5 refills | Status: DC

## 2019-01-16 MED ORDER — ESOMEPRAZOLE MAGNESIUM 40 MG PO CPDR: 40.0000 mg | DELAYED_RELEASE_CAPSULE | Freq: Every day | ORAL | 3 refills | Status: DC

## 2019-01-16 MED ORDER — CELECOXIB 200 MG PO CAPS: 200.0000 mg | ORAL_CAPSULE | Freq: Two times a day (BID) | ORAL | 1 refills | Status: DC

## 2019-01-16 NOTE — Patient Instructions (Signed)

## 2019-01-16 NOTE — Progress Notes (Signed)
Pain Management Virtual Encounter Note - Virtual Visit via Telephone Telehealth (real-time audio visits between healthcare provider and patient).   Patient's Phone No. & Preferred Pharmacy:  [phone] (home); [phone] (mobile); (Preferred) [phone] [email or web address]  [facility] [place] [address] [place] [address] Phone: [phone] Fax: [phone]    Pre-screening note:  Our staff contacted Mr. Sebesta and offered him an "in person", "face-to-face" appointment versus a telephone encounter. He indicated preferring the telephone encounter, at this time.   Reason for Virtual Visit: COVID-19*  Social distancing based on CDC and AMA recommendations.   I contacted Bernita BuffySamuel C Burdo on 01/16/2019 via telephone.      I clearly identified myself as Oswaldo DoneFrancisco A Kaesen Rodriguez, MD. I verified that I was speaking with the correct person using two identifiers (Name: Bernita BuffySamuel C Phommachanh, and date of birth: January 01, 1961).  Advanced Informed Consent I sought verbal advanced consent from Bernita BuffySamuel C Polka for virtual visit interactions. I informed Mr. Dimock of possible security and privacy concerns, risks, and limitations associated with providing "not-in-person" medical evaluation and management services. I also informed Mr. Nickolas of the availability of "in-person" appointments. Finally, I informed him that there would be a charge for the virtual visit and that he could be  personally, fully or partially, financially responsible for it. Mr. [name] expressed understanding and agreed to proceed.   Historic Elements   Mr. Bernita BuffySamuel C Victorio is a 58 y.o. year old, male patient evaluated today after his last encounter by our practice on 01/07/2019. Mr. [name]  has a past medical history of Acute postoperative pain (11/21/2017), Allergic rhinitis, Chronic pain syndrome, DDD (degenerative disc disease), lumbosacral, GERD (gastroesophageal reflux disease),  Hyperlipidemia, Osteoarthritis, and S/P appendectomy. He also  has a past surgical history that includes Appendectomy; repairs of bilateral ankle fractures; Spinal fusion (N/A); and Spinal cord stimulator implant (N/A). Mr. [name] has a current medication list which includes the following prescription(s): baclofen, celecoxib, esomeprazole, ibuprofen, oxycodone hcl, oxycodone hcl, oxycodone hcl, pregabalin, and gabapentin. He  reports that he has quit smoking. His smokeless tobacco use includes chew. He reports current alcohol use of about 3.0 standard drinks of alcohol per week. He reports that he does not use drugs. Mr. [name] has No Known Allergies.   HPI  Today, he is being contacted for medication management.  The patient indicates doing well with the current medication regimen. No adverse reactions or side effects reported to the medications.  The patient indicates that the Lyrica did not really help him with the pain and it only made him sleepy during the day.  He has pointed out that he had some gabapentin left and he went back to taking that.  He has been taking 600 mg at bedtime which as long as he takes early in the afternoon, does not give him any type of problems during the day.  We talked about this and we came to the conclusion that we will do a trial where he can go up to 900 mg of gabapentin at bedtime to see if that helps more.  He describes his pain as being primarily at bedtime where he experiences spasms and electrical-like sensations, which apparently did not give him any problem during the day and its only at night but it bothers him.  Because he continues to complain about this spasms, I will go ahead and change the baclofen regimen so that he can get 10 mg 3 times daily and  20 mg at bedtime.  Today I have offered to do a diagnostic caudal epidural steroid injection with the aim of possibly going for a Racz procedure since I suspect that he has quite a bit of scar tissue from his prior  surgery.  He indicated that he will like to try the medications first and we will go ahead and do that.   Pharmacotherapy Assessment  Analgesic: Oxycodone IR 10 mg, 1 tab PO q 6 hrs (40 mg/day of oxycodone) MME/day:60mg /day.   Monitoring: Pharmacotherapy: No side-effects or adverse reactions reported. [place] PMP: PDMP reviewed during this encounter.       Compliance: No problems identified. Effectiveness: Clinically acceptable. Plan: Refer to "POC".  UDS:  Summary  Date Value Ref Range Status  03/15/2018 FINAL  Final    Comment:    ==================================================================== TOXASSURE SELECT 13 (MW) ==================================================================== Test                             Result       Flag       Units Drug Present and Declared for Prescription Verification   Oxycodone                      4720         EXPECTED   ng/mg creat   Oxymorphone                    688          EXPECTED   ng/mg creat   Noroxycodone                   6555         EXPECTED   ng/mg creat   Noroxymorphone                 249          EXPECTED   ng/mg creat    Sources of oxycodone are scheduled prescription medications.    Oxymorphone, noroxycodone, and noroxymorphone are expected    metabolites of oxycodone. Oxymorphone is also available as a    scheduled prescription medication. ==================================================================== Test                      Result    Flag   Units      Ref Range   Creatinine              86               mg/dL      >=16 ==================================================================== Declared Medications:  The flagging and interpretation on this report are based on the  following declared medications.  Unexpected results may arise from  inaccuracies in the declared medications.  **Note: The testing scope of this panel includes these medications:  Oxycodone  **Note: The testing scope of this panel does  not include following  reported medications:  Celecoxib  Esomeprazole  Gabapentin  Ibuprofen  Olopatadine ==================================================================== For clinical consultation, please call [phone]. ====================================================================    Laboratory Chemistry Profile (12 mo)  Renal: No results found for requested labs within last 8760 hours.  Lab Results  Component Value Date   GFR 96.24 12/30/2014   GFRAA 120 12/12/2016   GFRNONAA 104 12/12/2016   Hepatic: No results found for requested labs within last 8760 hours. Lab Results  Component Value Date   AST 21 12/12/2016  ALT 12 12/30/2014   Other: No results found for requested labs within last 8760 hours. Note: Above Lab results reviewed.  Imaging  Last 90 days:  No results found.  Assessment  The primary encounter diagnosis was Chronic pain syndrome. Diagnoses of Chronic low back pain (Primary Area of Pain) (Bilateral) (L>R), Lumbar facet syndrome (Bilateral) (L>R), Retrolisthesis (4-5 mm) (L3 over L4) (L2 over L3, Osteoarthritis of lumbar spine, Chronic lower extremity pain (Secondary Area of Pain) (Left), Failed back surgical syndrome (Fusion L4-S1), Spinal cord stimulator dysfunction (fractured leads), Chronic musculoskeletal pain, Muscle cramps, Neurogenic pain, and Gastroesophageal reflux disease without esophagitis were also pertinent to this visit.  Plan of Care  I have discontinued Nahshon C. Russell "Clyde"'s hydrOXYzine. I have also changed his baclofen. Additionally, I am having him start on Oxycodone HCl, Oxycodone HCl, and gabapentin. Lastly, I am having him maintain his ibuprofen, pregabalin, esomeprazole, celecoxib, and Oxycodone HCl.  Pharmacotherapy (Medications Ordered): Meds ordered this encounter  Medications  . baclofen (LIORESAL) 10 MG tablet    Sig: Take 1 tablet (10 mg total) by mouth 3 (three) times daily AND 2 tablets (20 mg total) at  bedtime.    Dispense:  150 tablet    Refill:  5    Fill one day early if pharmacy is closed on scheduled refill date. May substitute for generic if available.  . esomeprazole (NEXIUM) 40 MG capsule    Sig: Take 1 capsule (40 mg total) by mouth at bedtime.    Dispense:  90 capsule    Refill:  3    Fill one day early if pharmacy is closed on scheduled refill date. May substitute for generic if available.  . celecoxib (CELEBREX) 200 MG capsule    Sig: Take 1 capsule (200 mg total) by mouth 2 (two) times daily.    Dispense:  180 capsule    Refill:  1    Do not place medication on "Automatic Refill". Fill one day early if pharmacy is closed on scheduled refill date.  . Oxycodone HCl 10 MG TABS    Sig: Take 1 tablet (10 mg total) by mouth every 6 (six) hours as needed. Must last 30 days.    Dispense:  120 tablet    Refill:  0    Chronic Pain: STOP Act (Not applicable) Fill 1 day early if closed on refill date. Do not fill until: 01/18/2019. To last until: 02/17/2019. Avoid benzodiazepines within 8 hours of opioids  . Oxycodone HCl 10 MG TABS    Sig: Take 1 tablet (10 mg total) by mouth every 6 (six) hours as needed. Must last 30 days.    Dispense:  120 tablet    Refill:  0    Chronic Pain: STOP Act (Not applicable) Fill 1 day early if closed on refill date. Do not fill until: 02/17/2019. To last until: 03/19/2019. Avoid benzodiazepines within 8 hours of opioids  . Oxycodone HCl 10 MG TABS    Sig: Take 1 tablet (10 mg total) by mouth every 6 (six) hours as needed. Must last 30 days.    Dispense:  120 tablet    Refill:  0    Chronic Pain: STOP Act (Not applicable) Fill 1 day early if closed on refill date. Do not fill until: 03/19/2019. To last until: 04/18/2019. Avoid benzodiazepines within 8 hours of opioids  . gabapentin (NEURONTIN) 300 MG capsule    Sig: Take 2-3 capsules (600-900 mg total) by mouth every 8 (eight) hours.  Dispense:  270 capsule    Refill:  5    Fill one day early if  pharmacy is closed on scheduled refill date. May substitute for generic if available.   Orders:  Orders Placed This Encounter  Procedures  . Caudal Epidural Injection    Standing Status:   Standing    Number of Occurrences:   1    Standing Expiration Date:   01/16/2020    Scheduling Instructions:     Laterality: Midline     Level(s): Sacrococcygeal canal (Tailbone area)     Sedation: With Sedation      TIMEFRAME: PRN procedure. (Mr. Vandegrift will call when needed.)    Order Specific Question:   Where will this procedure be performed?    Answer:   [facility]   Follow-up plan:   Return in about 13 weeks (around 04/17/2019) for (VV), (MM), in addition, PRN Procedure(s): ML Caudal ESI #1 + Epidurogram.      Interventional options:  Considering:   Therapeutic revision of replacement of nonfunctional SCS (Fractured leads).  On 06/30/2018 he was referred to Clydell Hakim, MD for this procedure.  Diagnostic caudal epidural steroid injection+diagnostic epidurogram #1 Possible Racz procedure #1   Palliative PRN treatment(s):   Palliative bilateral lumbar facet block #3  Palliativeright lumbar facetRFA#2(last done on 01/23/18)  Palliativeleft lumbar facetRFA#2(last done 11/21/17)     Recent Visits No visits were found meeting these conditions.  Showing recent visits within past 90 days and meeting all other requirements   Today's Visits Date Type Provider Dept  01/16/19 Office Visit Milinda Pointer, MD [facility]  Showing today's visits and meeting all other requirements   Future Appointments No visits were found meeting these conditions.  Showing future appointments within next 90 days and meeting all other requirements   I discussed the assessment and treatment plan with the patient. The patient was provided an opportunity to ask questions and all were answered. The patient agreed with the plan and demonstrated an understanding of the  instructions.  Patient advised to call back or seek an in-person evaluation if the symptoms or condition worsens.  Total duration of non-face-to-face encounter: 15 minutes.  Note by: Gaspar Cola, MD Date: 01/16/2019; Time: 4:45 PM  Note: This dictation was prepared with Dragon dictation. Any transcriptional errors that may result from this process are unintentional.  Disclaimer:  * Given the special circumstances of the COVID-19 pandemic, the federal government has announced that the Office for Civil Rights (OCR) will exercise its enforcement discretion and will not impose penalties on physicians using telehealth in the event of noncompliance with regulatory requirements under the [place] (HIPAA) in connection with the good faith provision of telehealth during the IRJJO-84 national public health emergency. ([place])

## 2019-01-22 NOTE — Progress Notes (Signed)
lvmail asking patient to come in for labs.

## 2019-03-07 ENCOUNTER — Encounter: Payer: Self-pay | Admitting: Pain Medicine

## 2019-04-09 ENCOUNTER — Encounter: Payer: Self-pay | Admitting: Pain Medicine

## 2019-04-09 NOTE — Progress Notes (Signed)
Patient: Lance Roman  Service Category: E/M  Provider: Oswaldo Done, MD  DOB: November 12, 1960  DOS: 04/10/2019  Location: Office  MRN: [id number]  Setting: Ambulatory outpatient  Referring Provider: Myrlene Broker, *  Type: Established Patient  Specialty: Interventional Pain Management  PCP: Myrlene Broker, MD  Location: Remote location  Delivery: TeleHealth     Virtual Encounter - Pain Management PROVIDER NOTE: Information contained herein reflects review and annotations entered in association with encounter. Interpretation of such information and data should be left to medically-trained personnel. Information provided to patient can be located elsewhere in the medical record under "Patient Instructions". Document created using STT-dictation technology, any transcriptional errors that may result from process are unintentional.    Contact & Pharmacy Preferred: [phone] Home: [phone] (home) Mobile: [phone] (mobile) E-mail: [email or web address]  [facility] - [place], [place] - [address] [place] [address] Phone: [phone] Fax: [phone]   Pre-screening  Mr. Bissonnette offered "in-person" vs "virtual" encounter. He indicated preferring virtual for this encounter.   Reason COVID-19*  Social distancing based on CDC and AMA recommendations.   I contacted DYLYN [facility] on 04/10/2019 via telephone.      I clearly identified myself as Oswaldo Done, MD. I verified that I was speaking with the correct person using two identifiers (Name: ANTONEY BIVEN, and date of birth: [birth year]).  Consent I sought verbal advanced consent from [name] for virtual visit interactions. I informed Mr. Gauss of possible security and privacy concerns, risks, and limitations associated with providing "not-in-person" medical evaluation and management services. I also informed Mr. Market of the availability of "in-person"  appointments. Finally, I informed him that there would be a charge for the virtual visit and that he could be  personally, fully or partially, financially responsible for it. Mr. Royce expressed understanding and agreed to proceed.   Historic Elements   Mr. DANIYAL TABOR is a 59 y.o. year old, male patient evaluated today after his last encounter by our practice on 01/16/2019. Mr. Dombkowski  has a past medical history of Acute postoperative pain (11/21/2017), Allergic rhinitis, Chronic pain syndrome, DDD (degenerative disc disease), lumbosacral, GERD (gastroesophageal reflux disease), Hyperlipidemia, Osteoarthritis, and S/P appendectomy. He also  has a past surgical history that includes Appendectomy; repairs of bilateral ankle fractures; Spinal fusion (N/A); and Spinal cord stimulator implant (N/A). Mr. Vanderwoude has a current medication list which includes the following prescription(s): baclofen, celecoxib, esomeprazole, gabapentin, ibuprofen, [START ON 04/18/2019] oxycodone hcl, [START ON 05/18/2019] oxycodone hcl, and [START ON 06/17/2019] oxycodone hcl. He  reports that he has quit smoking. His smokeless tobacco use includes chew. He reports current alcohol use of about 3.0 standard drinks of alcohol per week. He reports that he does not use drugs. Mr. Makris has No Known Allergies.   HPI  Today, he is being contacted for medication management.  The patient indicates doing well with the current medication regimen. No adverse reactions or side effects reported to the medications.  Today the patient clarified that he is not taking the Lyrica.  When he takes his the gabapentin 300 mg, 4 tablets at bedtime.  Still after taking that and the oxycodone, he refers that he has to take quite a bit of ibuprofen at bedtime to be able to sleep.  He complains of pain to be primarily in the area of the lower back and the legs.  He was also wondering as to what happened  with the caudal epidural steroid injection that we had  planned to do on the last visit.  He thinks that the model not being approved by [employer].  However, I have not heard about it, either way.  Today I will again put an order for the caudal epidural steroid injection and I have provided the patient with some information regarding the intrathecal pump.  His spinal cord stimulator has not been working since the day that it was put in.  Since the patient's primary complaint is that of low back pain and lower extremity pain, I am very doubtful that the spinal cord stimulator, even if we can fix the one that he has, will be able to call over those two areas.  Pharmacotherapy Assessment  Analgesic: Oxycodone IR 10 mg, 1 tab PO q 6 hrs (40 mg/day of oxycodone) MME/day:60mg /day.   Monitoring: Pharmacotherapy: No side-effects or adverse reactions reported. [place] PMP: PDMP reviewed during this encounter.       Compliance: No problems identified. Effectiveness: Clinically acceptable. Plan: Refer to "POC".  UDS:  Summary  Date Value Ref Range Status  03/15/2018 FINAL  Final    Comment:    ==================================================================== TOXASSURE SELECT 13 (MW) ==================================================================== Test                             Result       Flag       Units Drug Present and Declared for Prescription Verification   Oxycodone                      4720         EXPECTED   ng/mg creat   Oxymorphone                    688          EXPECTED   ng/mg creat   Noroxycodone                   6555         EXPECTED   ng/mg creat   Noroxymorphone                 249          EXPECTED   ng/mg creat    Sources of oxycodone are scheduled prescription medications.    Oxymorphone, noroxycodone, and noroxymorphone are expected    metabolites of oxycodone. Oxymorphone is also available as a    scheduled prescription medication. ==================================================================== Test                       Result    Flag   Units      Ref Range   Creatinine              86               mg/dL      >=33 ==================================================================== Declared Medications:  The flagging and interpretation on this report are based on the  following declared medications.  Unexpected results may arise from  inaccuracies in the declared medications.  **Note: The testing scope of this panel includes these medications:  Oxycodone  **Note: The testing scope of this panel does not include following  reported medications:  Celecoxib  Esomeprazole  Gabapentin  Ibuprofen  Olopatadine ==================================================================== For clinical consultation, please call [phone]. ====================================================================    Laboratory  Chemistry Profile (12 mo)  Renal: No results found for requested labs within last 8760 hours.  Lab Results  Component Value Date   GFR 96.24 12/30/2014   GFRAA 120 12/12/2016   GFRNONAA 104 12/12/2016   Hepatic: No results found for requested labs within last 8760 hours. Lab Results  Component Value Date   AST 21 12/12/2016   ALT 12 12/30/2014   Other: No results found for requested labs within last 8760 hours. Note: Above Lab results reviewed.  Imaging  DG C-Arm 1-60 Min-No Report Fluoroscopy was utilized by the requesting physician.  No radiographic  interpretation.    Assessment  The primary encounter diagnosis was Chronic pain syndrome. Diagnoses of Chronic low back pain (Primary Area of Pain) (Bilateral) (L>R), Lumbar facet syndrome (Bilateral) (L>R), Chronic lower extremity pain (Secondary Area of Pain) (Left), Failed back surgical syndrome (Fusion L4-S1), Spinal cord stimulator dysfunction (fractured leads), and Neurogenic pain were also pertinent to this visit.  Plan of Care  Problem-specific:  No problem-specific Assessment & Plan notes found for this  encounter.  I have discontinued Marquese Burkland. Thornell "Clyde"'s pregabalin. I am also having him start on Oxycodone HCl and Oxycodone HCl. Additionally, I am having him maintain his ibuprofen, baclofen, esomeprazole, celecoxib, gabapentin, and Oxycodone HCl.  Pharmacotherapy (Medications Ordered): Meds ordered this encounter  Medications  . Oxycodone HCl 10 MG TABS    Sig: Take 1 tablet (10 mg total) by mouth every 6 (six) hours as needed. Must last 30 days.    Dispense:  120 tablet    Refill:  0    Chronic Pain: STOP Act (Not applicable) Fill 1 day early if closed on refill date. Do not fill until: 04/18/2019. To last until: 05/18/2019. Avoid benzodiazepines within 8 hours of opioids  . Oxycodone HCl 10 MG TABS    Sig: Take 1 tablet (10 mg total) by mouth every 6 (six) hours as needed. Must last 30 days.    Dispense:  120 tablet    Refill:  0    Chronic Pain: STOP Act (Not applicable) Fill 1 day early if closed on refill date. Do not fill until: 05/18/2019. To last until: 06/17/2019. Avoid benzodiazepines within 8 hours of opioids  . Oxycodone HCl 10 MG TABS    Sig: Take 1 tablet (10 mg total) by mouth every 6 (six) hours as needed. Must last 30 days.    Dispense:  120 tablet    Refill:  0    Chronic Pain: STOP Act (Not applicable) Fill 1 day early if closed on refill date. Do not fill until: 06/17/2019. To last until: 07/17/2019. Avoid benzodiazepines within 8 hours of opioids   Orders:  Orders Placed This Encounter  Procedures  . Caudal ESI (Schedule)    Standing Status:   Future    Standing Expiration Date:   05/11/2019    Scheduling Instructions:     Laterality: Midline     Level(s): Sacrococcygeal canal (Tailbone area)     Sedation: Patient's choice     Scheduling Timeframe: As soon as pre-approved    Order Specific Question:   Where will this procedure be performed?    Answer:   [facility]   Follow-up plan:   Return in about 3 months (around 07/15/2019) for (VV), (MM), in  addition, Procedure (w/ sedation): (ML) Caudal ESI.      Interventional options:  Considering:   Therapeutic revision of replacement of nonfunctional SCS (Fractured leads).  On 06/30/2018 he was referred  to Odette Fraction, MD for this procedure.  Diagnostic caudal epidural steroid injection+diagnostic epidurogram #1 Possible Racz procedure #1   Palliative PRN treatment(s):   Palliative bilateral lumbar facet block #3  Palliativeright lumbar facetRFA#2(last done on 01/23/18)  Palliativeleft lumbar facetRFA#2(last done 11/21/17)      Recent Visits Date Type Provider Dept  01/16/19 Office Visit Delano Metz, MD [facility]  Showing recent visits within past 90 days and meeting all other requirements   Today's Visits Date Type Provider Dept  04/10/19 Telemedicine Delano Metz, MD [facility]  Showing today's visits and meeting all other requirements   Future Appointments No visits were found meeting these conditions.  Showing future appointments within next 90 days and meeting all other requirements   I discussed the assessment and treatment plan with the patient. The patient was provided an opportunity to ask questions and all were answered. The patient agreed with the plan and demonstrated an understanding of the instructions.  Patient advised to call back or seek an in-person evaluation if the symptoms or condition worsens.  Total duration of non-face-to-face encounter: 15 minutes.  Note by: Oswaldo Done, MD Date: 04/10/2019; Time: 8:28 AM

## 2019-04-10 ENCOUNTER — Other Ambulatory Visit: Payer: Self-pay

## 2019-04-10 ENCOUNTER — Ambulatory Visit: Payer: No Typology Code available for payment source | Attending: Pain Medicine | Admitting: Pain Medicine

## 2019-04-10 DIAGNOSIS — M961 Postlaminectomy syndrome, not elsewhere classified: Secondary | ICD-10-CM

## 2019-04-10 DIAGNOSIS — G8929 Other chronic pain: Secondary | ICD-10-CM

## 2019-04-10 DIAGNOSIS — M792 Neuralgia and neuritis, unspecified: Secondary | ICD-10-CM

## 2019-04-10 DIAGNOSIS — M47816 Spondylosis without myelopathy or radiculopathy, lumbar region: Secondary | ICD-10-CM | POA: Diagnosis not present

## 2019-04-10 DIAGNOSIS — Z9682 Presence of neurostimulator: Secondary | ICD-10-CM

## 2019-04-10 DIAGNOSIS — M5442 Lumbago with sciatica, left side: Secondary | ICD-10-CM

## 2019-04-10 DIAGNOSIS — T85192S Other mechanical complication of implanted electronic neurostimulator (electrode) of spinal cord, sequela: Secondary | ICD-10-CM

## 2019-04-10 DIAGNOSIS — G894 Chronic pain syndrome: Secondary | ICD-10-CM

## 2019-04-10 DIAGNOSIS — M79605 Pain in left leg: Secondary | ICD-10-CM

## 2019-04-10 MED ORDER — OXYCODONE HCL 10 MG PO TABS: 10.0000 mg | ORAL_TABLET | Freq: Four times a day (QID) | ORAL | 0 refills | Status: DC | PRN

## 2019-04-10 NOTE — Patient Instructions (Signed)
____________________________________________________________________________________________  Preparing for Procedure with Sedation  Procedure appointments are limited to planned procedures: . No Prescription Refills. . No disability issues will be discussed. . No medication changes will be discussed.  Instructions: . Oral Intake: Do not eat or drink anything for at least 8 hours prior to your procedure. . Transportation: Public transportation is not allowed. Bring an adult driver. The driver must be physically present in our waiting room before any procedure can be started. . Physical Assistance: Bring an adult physically capable of assisting you, in the event you need help. This adult should keep you company at home for at least 6 hours after the procedure. . Blood Pressure Medicine: Take your blood pressure medicine with a sip of water the morning of the procedure. . Blood thinners: Notify our staff if you are taking any blood thinners. Depending on which one you take, there will be specific instructions on how and when to stop it. . Diabetics on insulin: Notify the staff so that you can be scheduled 1st case in the morning. If your diabetes requires high dose insulin, take only  of your normal insulin dose the morning of the procedure and notify the staff that you have done so. . Preventing infections: Shower with an antibacterial soap the morning of your procedure. . Build-up your immune system: Take 1000 mg of Vitamin C with every meal (3 times a day) the day prior to your procedure. . Antibiotics: Inform the staff if you have a condition or reason that requires you to take antibiotics before dental procedures. . Pregnancy: If you are pregnant, call and cancel the procedure. . Sickness: If you have a cold, fever, or any active infections, call and cancel the procedure. . Arrival: You must be in the facility at least 30 minutes prior to your scheduled procedure. . Children: Do not bring  children with you. . Dress appropriately: Bring dark clothing that you would not mind if they get stained. . Valuables: Do not bring any jewelry or valuables.  Reasons to call and reschedule or cancel your procedure: (Following these recommendations will minimize the risk of a serious complication.) . Surgeries: Avoid having procedures within 2 weeks of any surgery. (Avoid for 2 weeks before or after any surgery). . Flu Shots: Avoid having procedures within 2 weeks of a flu shots or . (Avoid for 2 weeks before or after immunizations). . Barium: Avoid having a procedure within 7-10 days after having had a radiological study involving the use of radiological contrast. (Myelograms, Barium swallow or enema study). . Heart attacks: Avoid any elective procedures or surgeries for the initial 6 months after a "Myocardial Infarction" (Heart Attack). . Blood thinners: It is imperative that you stop these medications before procedures. Let us know if you if you take any blood thinner.  . Infection: Avoid procedures during or within two weeks of an infection (including chest colds or gastrointestinal problems). Symptoms associated with infections include: Localized redness, fever, chills, night sweats or profuse sweating, burning sensation when voiding, cough, congestion, stuffiness, runny nose, sore throat, diarrhea, nausea, vomiting, cold or Flu symptoms, recent or current infections. It is specially important if the infection is over the area that we intend to treat. . Heart and lung problems: Symptoms that may suggest an active cardiopulmonary problem include: cough, chest pain, breathing difficulties or shortness of breath, dizziness, ankle swelling, uncontrolled high or unusually low blood pressure, and/or palpitations. If you are experiencing any of these symptoms, cancel your procedure and contact   your primary care physician for an evaluation.  Remember:  Regular Business hours are:  Monday to Thursday  8:00 AM to 4:00 PM  Provider's Schedule: Milinda Pointer, MD:  Procedure days: Tuesday and Thursday 7:30 AM to 4:00 PM  [name], MD:  Procedure days: Monday and Wednesday 7:30 AM to 4:00 PM ____________________________________________________________________________________________    Intrathecal Pain Pump Information An intrathecal pain pump is a pump that sends medicine into your spinal canal through a thin tube (catheter). You may need an intrathecal pain pump if you have long-term pain that cannot be managed in other ways. In some cases, this pump can be used to deliver medicine that treats long-term muscle spasms (spasticity).  If you are using the pump for only a short time, the pump may be outside your body.  For long-term use, the pump and the catheter are implanted inside your body. The pump is a small round disc and is usually placed under the skin of the abdomen. The catheter will run under your skin from your abdomen to your back. Your health care provider will program the pump to deliver pain medicine into your spinal canal continuously or at certain intervals. You may have a handheld device that allows you to give yourself a dose of pain medicine when needed. The pump runs on a battery. The battery will last 7-10 years, and then the pump will need to be replaced. What are the benefits of an intrathecal pain pump? Opioid pain medicines are strong medicines that are often used to control long-term and severe pain. When these medicines are placed in the fluid space (intrathecal space) that surrounds your spinal cord and brain, the medicine will block the pain signals that come from your body. This type of pump may be an option if you have long-term pain that cannot be managed with opioids that are taken by mouth or by injection. The pump may allow you to have better control of your pain with less medicine. What are the risks of an intrathecal pain pump? Before you can have the  pump placed, you will need to go through a pain control trial. Medicine will be placed into your intrathecal space to see if it controls your pain and to see how well you tolerate the medicine. You will need to have a surgical procedure to place the catheter and the pain pump. This surgery has some risks. There are also risks to long-term use of opioids. Risks of pain pump implantation and use include:  Infection.  Bleeding.  Failure of the pump or the catheter.  Leaking of fluid from your spinal canal (cerebrospinal fluid leak).  A growth around the tip of the catheter inside your spinal canal (granuloma). This can put pressure on your spine.  A need to remove the pump if problems develop.  A need to replace the pump after 7-10 years. Risks of long-term opioid use include:  A breathing problem that prevents you from getting enough oxygen (respiratory depression).  Physical and psychological dependence.  Constipation or difficulty passing urine.  Nausea and vomiting.  Mental changes, including feeling high, dizzy, drowsy, confused, depressed, or anxious. How do I use the pump? Your health care provider will decide what type of pump is best for you. If you have a pump that gives a dose of medicine on demand, you may have a handheld device to trigger the release of your medicine. You will need to see your health care provider often to make sure the pump  is programmed correctly for your pain. This programming may need to be changed over time. You will also need to have the pump resupplied with medicine. Your health care provider may do this at your routine visits. You should also know that:  Electronic devices or scans will not damage your pump.  You will need a device identification card to pass through security gates.  Your device will have an alarm to warn you of a malfunction or low battery power. The alarm will sound if you need a medicine refill. Contact your health care  provider if your alarm sounds. Summary  An intrathecal pain pump is a pump that sends medicine into your spinal canal through a thin tube (catheter).  The pump is a small round disc and is usually placed under the skin of the abdomen.  This type of pump may be an option if you have long-term pain that cannot be managed with opioids that are taken by mouth or by injection.  The pump may allow you to have better control of your pain with less medicine. This information is not intended to replace advice given to you by your health care provider. Make sure you discuss any questions you have with your health care provider. Document Revised: 01/15/2018 Document Reviewed: 01/15/2018 [employer] Patient Education  2020 [employer].

## 2019-04-19 ENCOUNTER — Other Ambulatory Visit: Payer: Self-pay | Admitting: Pain Medicine

## 2019-04-19 DIAGNOSIS — G894 Chronic pain syndrome: Secondary | ICD-10-CM

## 2019-05-14 ENCOUNTER — Ambulatory Visit
Admission: RE | Admit: 2019-05-14 | Discharge: 2019-05-14 | Disposition: A | Discharge status: Home / Self Care | Payer: BC Managed Care – PPO | Source: Ambulatory Visit | Attending: Pain Medicine | Admitting: Pain Medicine

## 2019-05-14 ENCOUNTER — Other Ambulatory Visit: Payer: Self-pay

## 2019-05-14 ENCOUNTER — Encounter: Payer: Self-pay | Admitting: Pain Medicine

## 2019-05-14 ENCOUNTER — Ambulatory Visit: Payer: No Typology Code available for payment source | Attending: Pain Medicine | Admitting: Pain Medicine

## 2019-05-14 VITALS — BP 130/73 | HR 62 | Temp 97.4°F | Resp 17 | Ht 70.0 in | Wt 230.0 lb

## 2019-05-14 DIAGNOSIS — M5442 Lumbago with sciatica, left side: Secondary | ICD-10-CM | POA: Diagnosis present

## 2019-05-14 DIAGNOSIS — M79605 Pain in left leg: Secondary | ICD-10-CM | POA: Diagnosis present

## 2019-05-14 DIAGNOSIS — M5137 Other intervertebral disc degeneration, lumbosacral region: Secondary | ICD-10-CM | POA: Diagnosis present

## 2019-05-14 DIAGNOSIS — M51379 Other intervertebral disc degeneration, lumbosacral region without mention of lumbar back pain or lower extremity pain: Secondary | ICD-10-CM

## 2019-05-14 DIAGNOSIS — M961 Postlaminectomy syndrome, not elsewhere classified: Secondary | ICD-10-CM | POA: Diagnosis present

## 2019-05-14 DIAGNOSIS — G96198 Other disorders of meninges, not elsewhere classified: Secondary | ICD-10-CM | POA: Diagnosis present

## 2019-05-14 DIAGNOSIS — G8929 Other chronic pain: Secondary | ICD-10-CM | POA: Diagnosis present

## 2019-05-14 DIAGNOSIS — M5410 Radiculopathy, site unspecified: Secondary | ICD-10-CM | POA: Insufficient documentation

## 2019-05-14 MED ORDER — SODIUM CHLORIDE (PF) 0.9 % IJ SOLN
INTRAMUSCULAR | Status: AC
  Filled 2019-05-14: qty 10

## 2019-05-14 MED ORDER — IOHEXOL 180 MG/ML  SOLN
INTRAMUSCULAR | Status: AC
  Filled 2019-05-14: qty 20

## 2019-05-14 MED ORDER — LIDOCAINE HCL 2 % IJ SOLN
20.0000 mL | Freq: Once | INTRAMUSCULAR | Status: AC
  Administered 2019-05-14: 10:00:00 400 mg

## 2019-05-14 MED ORDER — TRIAMCINOLONE ACETONIDE 40 MG/ML IJ SUSP
40.0000 mg | Freq: Once | INTRAMUSCULAR | Status: AC
  Administered 2019-05-14: 40 mg

## 2019-05-14 MED ORDER — MIDAZOLAM HCL 5 MG/5ML IJ SOLN
INTRAMUSCULAR | Status: AC
  Filled 2019-05-14: qty 5

## 2019-05-14 MED ORDER — TRIAMCINOLONE ACETONIDE 40 MG/ML IJ SUSP
INTRAMUSCULAR | Status: AC
  Filled 2019-05-14: qty 1

## 2019-05-14 MED ORDER — ROPIVACAINE HCL 2 MG/ML IJ SOLN
INTRAMUSCULAR | Status: AC
  Filled 2019-05-14: qty 10

## 2019-05-14 MED ORDER — IOHEXOL 180 MG/ML  SOLN
10.0000 mL | Freq: Once | INTRAMUSCULAR | Status: AC
  Administered 2019-05-14: 10:00:00 10 mL via EPIDURAL

## 2019-05-14 MED ORDER — LIDOCAINE HCL 2 % IJ SOLN
INTRAMUSCULAR | Status: AC
  Filled 2019-05-14: qty 20

## 2019-05-14 MED ORDER — FENTANYL CITRATE (PF) 100 MCG/2ML IJ SOLN: 25.0000 ug | INTRAMUSCULAR | Status: DC | PRN

## 2019-05-14 MED ORDER — MIDAZOLAM HCL 5 MG/5ML IJ SOLN: 1.0000 mg | INTRAMUSCULAR | Status: DC | PRN

## 2019-05-14 MED ORDER — FENTANYL CITRATE (PF) 100 MCG/2ML IJ SOLN
INTRAMUSCULAR | Status: AC
  Filled 2019-05-14: qty 2

## 2019-05-14 MED ORDER — SODIUM CHLORIDE 0.9% FLUSH
2.0000 mL | Freq: Once | INTRAVENOUS | Status: AC
  Administered 2019-05-14: 10:00:00 2 mL

## 2019-05-14 MED ORDER — ROPIVACAINE HCL 2 MG/ML IJ SOLN
2.0000 mL | Freq: Once | INTRAMUSCULAR | Status: AC
  Administered 2019-05-14: 10:00:00 2 mL via EPIDURAL

## 2019-05-14 MED ORDER — LACTATED RINGERS IV SOLN: 1000.0000 mL | Freq: Once | INTRAVENOUS | Status: DC

## 2019-05-14 NOTE — Patient Instructions (Signed)

## 2019-05-14 NOTE — Progress Notes (Signed)
Safety precautions to be maintained throughout the outpatient stay will include: orient to surroundings, keep bed in low position, maintain call bell within reach at all times, provide assistance with transfer out of bed and ambulation.  

## 2019-05-14 NOTE — Progress Notes (Signed)
PROVIDER NOTE: Information contained herein reflects review and annotations entered in association with encounter. Interpretation of such information and data should be left to medically-trained personnel. Information provided to patient can be located elsewhere in the medical record under "Patient Instructions". Document created using STT-dictation technology, any transcriptional errors that may result from process are unintentional.    Patient: Lance Roman  Service Category: Procedure  Provider: Oswaldo Done, MD  DOB: 1960-06-13  DOS: 05/14/2019  Location: [facility]  MRN: [id number]  Setting: Ambulatory - outpatient  Referring Provider: Myrlene Broker, *  Type: Established Patient  Specialty: Interventional Pain Management  PCP: Myrlene Broker, MD   Primary Reason for Visit: Interventional Pain Management Treatment. CC: Back Pain (low)  Procedure:          Anesthesia, Analgesia, Anxiolysis:  Type: Diagnostic Epidural Steroid Injection + Diagnostic Epidurogram #1  Region: Caudal Level: Sacrococcygeal   Laterality: Midline aiming at the left  Type: Local Anesthesia Indication(s): Analgesia         Route: Infiltration ([place]/IM) IV Access: Declined Sedation: Declined  Local Anesthetic: Lidocaine 1-2%  Position: Prone   Indications: 1. DDD (degenerative disc disease), lumbosacral   2. Failed back surgical syndrome (Fusion L4-S1)   3. Chronic lumbar radicular pain (L5) (Left)   4. Chronic lower extremity pain (Secondary Area of Pain) (Left)   5. Chronic low back pain (Primary Area of Pain) (Bilateral) (L>R)   6. Failed back surgical syndrome    Pain Score: Pre-procedure: 4 /10 Post-procedure: 1 /10   Pre-op Assessment:  Lance Roman is a 59 y.o. (year old), male patient, seen today for interventional treatment. He  has a past surgical history that includes Appendectomy; repairs of bilateral ankle fractures; Spinal fusion (N/A); and Spinal cord  stimulator implant (N/A). Lance Roman has a current medication list which includes the following prescription(s): baclofen, celecoxib, esomeprazole, gabapentin, ibuprofen, oxycodone hcl, [START ON 05/18/2019] oxycodone hcl, and [START ON 06/17/2019] oxycodone hcl. His primarily concern today is the Back Pain (low)  Initial Vital Signs:  Pulse/HCG Rate: 64  Temp: (!) 97.4 F (36.3 C) Resp: 18 BP: 138/65 SpO2: 99 %  BMI: Estimated body mass index is 33 kg/m as calculated from the following:   Height as of this encounter: 5\' 10"  (1.778 m).   Weight as of this encounter: 230 lb (104.3 kg).  Risk Assessment: Allergies: Reviewed. He has No Known Allergies.  Allergy Precautions: None required Coagulopathies: Reviewed. None identified.  Blood-thinner therapy: None at this time Active Infection(s): Reviewed. None identified. Lance Roman is afebrile  Site Confirmation: Lance Roman was asked to confirm the procedure and laterality before marking the site Procedure checklist: Completed Consent: Before the procedure and under the influence of no sedative(s), amnesic(s), or anxiolytics, the patient was informed of the treatment options, risks and possible complications. To fulfill our ethical and legal obligations, as recommended by the American Medical Association's Code of Ethics, I have informed the patient of my clinical impression; the nature and purpose of the treatment or procedure; the risks, benefits, and possible complications of the intervention; the alternatives, including doing nothing; the risk(s) and benefit(s) of the alternative treatment(s) or procedure(s); and the risk(s) and benefit(s) of doing nothing. The patient was provided information about the general risks and possible complications associated with the procedure. These may include, but are not limited to: failure to achieve desired goals, infection, bleeding, organ or nerve damage, allergic reactions, paralysis, and death. In  addition, the  patient was informed of those risks and complications associated to Spine-related procedures, such as failure to decrease pain; infection (i.e.: Meningitis, epidural or intraspinal abscess); bleeding (i.e.: epidural hematoma, subarachnoid hemorrhage, or any other type of intraspinal or peri-dural bleeding); organ or nerve damage (i.e.: Any type of peripheral nerve, nerve root, or spinal cord injury) with subsequent damage to sensory, motor, and/or autonomic systems, resulting in permanent pain, numbness, and/or weakness of one or several areas of the body; allergic reactions; (i.e.: anaphylactic reaction); and/or death. Furthermore, the patient was informed of those risks and complications associated with the medications. These include, but are not limited to: allergic reactions (i.e.: anaphylactic or anaphylactoid reaction(s)); adrenal axis suppression; blood sugar elevation that in diabetics may result in ketoacidosis or comma; water retention that in patients with history of congestive heart failure may result in shortness of breath, pulmonary edema, and decompensation with resultant heart failure; weight gain; swelling or edema; medication-induced neural toxicity; particulate matter embolism and blood vessel occlusion with resultant organ, and/or nervous system infarction; and/or aseptic necrosis of one or more joints. Finally, the patient was informed that Medicine is not an exact science; therefore, there is also the possibility of unforeseen or unpredictable risks and/or possible complications that may result in a catastrophic outcome. The patient indicated having understood very clearly. We have given the patient no guarantees and we have made no promises. Enough time was given to the patient to ask questions, all of which were answered to the patient's satisfaction. Lance Roman has indicated that he wanted to continue with the procedure. Attestation: I, the ordering provider, attest that I  have discussed with the patient the benefits, risks, side-effects, alternatives, likelihood of achieving goals, and potential problems during recovery for the procedure that I have provided informed consent. Date  Time: 05/14/2019  9:11 AM  Pre-Procedure Preparation:  Monitoring: As per clinic protocol. Respiration, ETCO2, SpO2, BP, heart rate and rhythm monitor placed and checked for adequate function Safety Precautions: Patient was assessed for positional comfort and pressure points before starting the procedure. Time-out: I initiated and conducted the "Time-out" before starting the procedure, as per protocol. The patient was asked to participate by confirming the accuracy of the "Time Out" information. Verification of the correct person, site, and procedure were performed and confirmed by me, the nursing staff, and the patient. "Time-out" conducted as per Joint Commission's Universal Protocol (UP.01.01.01). Time: 0937  Description of Procedure:          Target Area: Caudal Epidural Canal. Approach: Midline approach. Area Prepped: Entire Posterior Sacrococcygeal Region Prepping solution: DuraPrep (Iodine Povacrylex [0.7% available iodine] and Isopropyl Alcohol, 74% w/w) Safety Precautions: Aspiration looking for blood return was conducted prior to all injections. At no point did we inject any substances, as a needle was being advanced. No attempts were made at seeking any paresthesias. Safe injection practices and needle disposal techniques used. Medications properly checked for expiration dates. SDV (single dose vial) medications used. Description of the Procedure: Protocol guidelines were followed. The patient was placed in position over the fluoroscopy table. The target area was identified and the area prepped in the usual manner. Skin & deeper tissues infiltrated with local anesthetic. Appropriate amount of time allowed to pass for local anesthetics to take effect. The procedure needles were  then advanced to the target area. Proper needle placement secured. Negative aspiration confirmed. Solution injected in intermittent fashion, asking for systemic symptoms every 0.5cc of injectate. The needles were then removed and the area cleansed, making  sure to leave some of the prepping solution back to take advantage of its long term bactericidal properties. Vitals:   05/14/19 0911 05/14/19 0935 05/14/19 0940 05/14/19 0945  BP: 138/65 133/62 122/76 130/73  Pulse: 64 60 (!) 58 62  Resp: 18 16 14 17   Temp: (!) 97.4 F (36.3 C)     SpO2: 99% 98% 97% 97%  Weight: 230 lb (104.3 kg)     Height: 5\' 10"  (1.778 m)       Start Time: 0937 hrs. End Time: 0944 hrs. Materials:  Needle(s) Type: Epidural needle Gauge: 17G Length: 3.5-in Medication(s): Please see orders for medications and dosing details.  Imaging Guidance (Spinal):          Type of Imaging Technique: Fluoroscopy Guidance (Spinal) Indication(s): Assistance in needle guidance and placement for procedures requiring needle placement in or near specific anatomical locations not easily accessible without such assistance. Exposure Time: Please see nurses notes. Contrast: Before injecting any contrast, we confirmed that the patient did not have an allergy to iodine, shellfish, or radiological contrast. Once satisfactory needle placement was completed at the desired level, radiological contrast was injected. Contrast injected under live fluoroscopy. No contrast complications. See chart for type and volume of contrast used. Fluoroscopic Guidance: I was personally present during the use of fluoroscopy. "Tunnel Vision Technique" used to obtain the best possible view of the target area. Parallax error corrected before commencing the procedure. "Direction-depth-direction" technique used to introduce the needle under continuous pulsed fluoroscopy. Once target was reached, antero-posterior, oblique, and lateral fluoroscopic projection used confirm  needle placement in all planes. Images permanently stored in EMR. Interpretation: I personally interpreted the imaging intraoperatively. Adequate needle placement confirmed in multiple planes. Appropriate spread of contrast into desired area was observed. No evidence of afferent or efferent intravascular uptake. No intrathecal or subarachnoid spread observed. Permanent images saved into the patient's record.  Diagnostic Epidurogram:  Contrast: Before injecting any contrast, we confirmed that the patient did not have an allergy to iodine, shellfish, or radiological contrast. For accuracy purposes, contrast was injected under live fluoroscopy. Study personally interpreted intraoparatively. Type: Non-ionic, water soluble, hypoallergenic, myelogram-compatible, radiological contrast used. Please see orders and nurses note for specific choice of contrast. Volume: Please see nurses note for injected volume.                Observations:  Spinal Alignment: Adequate       Vertebral body: Intact Lamina: Prior laminectomy Disc: Loss of disc hight Facet: Arthritic changes        Hardware: Pedicle screw(s): Present bilaterally but normal evidence of lucency around them  Spread: Abnormal contrast spread. See below. Anterior: Limited Posterior: Defect identified on the left at the L5-S1 level Superior (cephalad): Flow limited to: The caudal canal and sacral nerve roots with restriction to flow below the L5-S1 level Inferior (caudad): Adequate Right lateral: Adequate.  Left lateral: Flow limited to: The S1 nerve root and below Plica medialis dorsalis: Medially aligned with no perceived deviation Nerve root(s): Defect identified affecting the left L5 nerve root. The right L5 nerve root can be seen as well as the rest of the sacral nerve roots. Epidural extravasation: None observed Intrathecal: No intrathecal spread identified Subarachnoid: No subarachnoid spread pattern observed Vascular: No evidence  of afferent or efferent intravascular uptake  Impression: Technically successful epidurogram. 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  Antibiotic Prophylaxis:   Anti-infectives (From admission, onward)  None     Indication(s): None identified  Post-operative Assessment:  Post-procedure Vital Signs:  Pulse/HCG Rate: 62  Temp: (!) 97.4 F (36.3 C) Resp: 17 BP: 130/73 SpO2: 97 %  EBL: None  Complications: No immediate post-treatment complications observed by team, or reported by patient.  Note: The patient tolerated the entire procedure well. A repeat set of vitals were taken after the procedure and the patient was kept under observation following institutional policy, for this type of procedure. Post-procedural neurological assessment was performed, showing return to baseline, prior to discharge. The patient was provided with post-procedure discharge instructions, including a section on how to identify potential problems. Should any problems arise concerning this procedure, the patient was given instructions to immediately contact us, at any time, without hesitation. In any case, we plan to contact the patient by telephone for a follow-up status report regarding this interventional procedure.  Comments:  No additional relevant information.  Plan of Care  Orders:  Orders Placed This Encounter  Procedures  . Caudal Epidural Injection    Scheduling Instructions:     Laterality: Left-sided     Level(s): Sacrococcygeal canal (Tailbone area)     Sedation: Patient's choice     Timeframe: Today    Order Specific Question:   Where will this procedure be performed?    Answer:   [facility]  . [facility] C-ARM 1-60 MIN NO REPORT    Intraoperative interpretation by procedural physician at [facility].    Standing Status:   Standing    Number of Occurrences:   1    Order Specific Question:    Reason for exam:    Answer:   Assistance in needle guidance and placement for procedures requiring needle placement in or near specific anatomical locations not easily accessible without such assistance.  . Informed Consent Details: Physician/Practitioner Attestation; Transcribe to consent form and obtain patient signature    Nursing Order: Transcribe to consent form and obtain patient signature. Note: Always confirm laterality of pain with Mr. Rupnow, before procedure. Procedure: Caudal epidural steroid injection Indication/Reason: Low back pain and lower extremity pain secondary to lumbosacral radiculitis Provider Attestation: I, Jaylan Hinojosa A. Laban Emperor, MD, (Pain Management Specialist), the physician/practitioner, attest that I have discussed with the patient the benefits, risks, side effects, alternatives, likelihood of achieving goals and potential problems during recovery for the procedure that I have provided informed consent.  . Provide equipment / supplies at bedside    Equipment required: Single use, disposable, "Epidural Tray" Epidural Catheter: NOT required    Standing Status:   Standing    Number of Occurrences:   1    Order Specific Question:   Specify    Answer:   Epidural Tray   Chronic Opioid Analgesic:  Oxycodone IR 10 mg, 1 tab PO q 6 hrs (40 mg/day of oxycodone) MME/day:60mg /day.   Medications ordered for procedure: Meds ordered this encounter  Medications  . iohexol (OMNIPAQUE) 180 MG/ML injection 10 mL    Must be Myelogram-compatible. If not available, you may substitute with a water-soluble, non-ionic, hypoallergenic, myelogram-compatible radiological contrast medium.  Marland Kitchen lidocaine (XYLOCAINE) 2 % (with pres) injection 400 mg  . DISCONTD: lactated ringers infusion 1,000 mL  . DISCONTD: midazolam (VERSED) 5 MG/5ML injection 1-2 mg    Make sure Flumazenil is available in the pyxis when using this medication. If oversedation occurs, administer 0.2 mg IV over 15 sec. If  after 45 sec no response, administer 0.2 mg again over 1  min; may repeat at 1 min intervals; not to exceed 4 doses (1 mg)  . DISCONTD: fentaNYL (SUBLIMAZE) injection 25-50 mcg    Make sure Narcan is available in the pyxis when using this medication. In the event of respiratory depression (RR< 8/min): Titrate NARCAN (naloxone) in increments of 0.1 to 0.2 mg IV at 2-3 minute intervals, until desired degree of reversal.  . sodium chloride flush (NS) 0.9 % injection 2 mL  . ropivacaine (PF) 2 mg/mL (0.2%) (NAROPIN) injection 2 mL  . triamcinolone acetonide (KENALOG-40) injection 40 mg   Medications administered: We administered iohexol, lidocaine, sodium chloride flush, ropivacaine (PF) 2 mg/mL (0.2%), and triamcinolone acetonide.  See the medical record for exact dosing, route, and time of administration.  Follow-up plan:   Return in about 2 weeks (around 05/28/2019) for (VV), (PP).       Interventional options:  Considering:   Therapeutic revision of replacement of nonfunctional SCS (Fractured leads).  On 06/30/2018 he was referred to Clydell Hakim, MD for this procedure.  Diagnostic caudal epidural steroid injection+diagnostic epidurogram #1 Possible Racz procedure #1   Palliative PRN treatment(s):   Palliative bilateral lumbar facet block #3  Palliativeright lumbar facetRFA#2(last done on 01/23/18)  Palliativeleft lumbar facetRFA#2(last done 11/21/17)       Recent Visits Date Type Provider Dept  04/10/19 Telemedicine Milinda Pointer, MD [facility]  Showing recent visits within past 90 days and meeting all other requirements   Today's Visits Date Type Provider Dept  05/14/19 Procedure visit Milinda Pointer, MD [facility]  Showing today's visits and meeting all other requirements   Future Appointments Date Type Provider Dept  05/29/19 Appointment Milinda Pointer, MD [facility]  07/10/19 Appointment Milinda Pointer, MD [facility]  Showing future appointments within next 90 days and meeting all other requirements   Disposition: Discharge home  Discharge (Date  Time): 05/14/2019; 0951 hrs.   Primary Care Physician: Hoyt Koch, MD Location: [facility] Note by: Gaspar Cola, MD Date: 05/14/2019; Time: 10:17 AM  Disclaimer:  Medicine is not an exact science. The only guarantee in medicine is that nothing is guaranteed. It is important to note that the decision to proceed with this intervention was based on the information collected from the patient. The Data and conclusions were drawn from the patient's questionnaire, the interview, and the physical examination. Because the information was provided in large part by the patient, it cannot be guaranteed that it has not been purposely or unconsciously manipulated. Every effort has been made to obtain as much relevant data as possible for this evaluation. It is important to note that the conclusions that lead to this procedure are derived in large part from the available data. Always take into account that the treatment will also be dependent on availability of resources and existing treatment guidelines, considered by other Pain Management Practitioners as being common knowledge and practice, at the time of the intervention. For Medico-Legal purposes, it is also important to point out that variation in procedural techniques and pharmacological choices are the acceptable norm. The indications, contraindications, technique, and results of the above procedure should only be interpreted and judged by a Board-Certified Interventional Pain Specialist with extensive familiarity and expertise in the same exact procedure and technique.

## 2019-05-15 ENCOUNTER — Telehealth: Payer: Self-pay

## 2019-05-15 NOTE — Telephone Encounter (Signed)
Post procedure phone call.  Patients wife states he went to work and was doing well.

## 2019-05-18 ENCOUNTER — Other Ambulatory Visit: Payer: Self-pay | Admitting: Pain Medicine

## 2019-05-18 DIAGNOSIS — G894 Chronic pain syndrome: Secondary | ICD-10-CM

## 2019-05-27 ENCOUNTER — Telehealth: Payer: BC Managed Care – PPO | Admitting: Physician Assistant

## 2019-05-27 DIAGNOSIS — R21 Rash and other nonspecific skin eruption: Secondary | ICD-10-CM

## 2019-05-27 MED ORDER — PREDNISONE 10 MG (21) PO TBPK: ORAL_TABLET | Freq: Every day | ORAL | 0 refills | Status: DC

## 2019-05-27 NOTE — Progress Notes (Signed)
E Visit for Rash  We are sorry that you are not feeling well. Here is how we plan to help!  Based on what you shared with me it looks like you have contact dermatitis.  Contact dermatitis is a skin rash caused by something that touches the skin and causes irritation or inflammation.  Your skin may be red, swollen, dry, cracked, and itch.  The rash should go away in a few days but can last a few weeks.  If you get a rash, it's important to figure out what caused it so the irritant can be avoided in the future.   Prednisone 10 mg daily for 6 days (see taper instructions below)  Directions for 6 day taper: Take 6 tabs by mouth daily for 2 days, then 5 tabs for 2 days, then 4 tabs for 2 days, then 3 tabs for 2 days, then 2 tabs for 2 days, then 1 tab by mouth daily for 2 days  HOME CARE:   Take cool showers and avoid direct sunlight.  Apply cool compress or wet dressings.  Take a bath in an oatmeal bath.  Sprinkle content of one Aveeno packet under running faucet with comfortably warm water.  Bathe for 15-20 minutes, 1-2 times daily.  Pat dry with a towel. Do not rub the rash.  Use hydrocortisone cream.  Take an antihistamine like Benadryl for widespread rashes that itch.  The adult dose of Benadryl is 25-50 mg by mouth 4 times daily.  Caution:  This type of medication may cause sleepiness.  Do not drink alcohol, drive, or operate dangerous machinery while taking antihistamines.  Do not take these medications if you have prostate enlargement.  Read package instructions thoroughly on all medications that you take.  GET HELP RIGHT AWAY IF:   Symptoms don't go away after treatment.  Severe itching that persists.  If you rash spreads or swells.  If you rash begins to smell.  If it blisters and opens or develops a yellow-brown crust.  You develop a fever.  You have a sore throat.  You become short of breath.  MAKE SURE YOU:  Understand these instructions. Will watch your  condition. Will get help right away if you are not doing well or get worse.  Thank you for choosing an e-visit. Your e-visit answers were reviewed by a board certified advanced clinical practitioner to complete your personal care plan. Depending upon the condition, your plan could have included both over the counter or prescription medications. Please review your pharmacy choice. Be sure that the pharmacy you have chosen is open so that you can pick up your prescription now.  If there is a problem you may message your provider in MyChart to have the prescription routed to another pharmacy. Your safety is important to [place]. If you have drug allergies check your prescription carefully.  For the next 24 hours, you can use MyChart to ask questions about today's visit, request a non-urgent call back, or ask for a work or school excuse from your e-visit provider. You will get an email in the next two days asking about your experience. I hope that your e-visit has been valuable and will speed your recovery.   Approximately 5 minutes was spent documenting and reviewing patient's chart.

## 2019-05-28 ENCOUNTER — Encounter: Payer: Self-pay | Admitting: Pain Medicine

## 2019-05-28 ENCOUNTER — Encounter: Payer: Self-pay | Admitting: Internal Medicine

## 2019-05-28 MED ORDER — OXYCODONE HCL 10 MG PO TABS: 10.0000 mg | ORAL_TABLET | Freq: Four times a day (QID) | ORAL | 0 refills | Status: DC | PRN

## 2019-05-28 MED ORDER — CELECOXIB 200 MG PO CAPS: 200.0000 mg | ORAL_CAPSULE | Freq: Two times a day (BID) | ORAL | 1 refills | Status: DC

## 2019-05-28 MED ORDER — GABAPENTIN 300 MG PO CAPS: 600.0000 mg | ORAL_CAPSULE | Freq: Three times a day (TID) | ORAL | 5 refills | Status: DC

## 2019-05-28 MED ORDER — BACLOFEN 10 MG PO TABS: ORAL_TABLET | ORAL | 5 refills | Status: DC

## 2019-05-28 NOTE — Assessment & Plan Note (Signed)
Diagnostic caudal epidurogram done on 05/14/2019 confirms epidural fibrosis with obstruction to flow into the area of the left S1, and both L5 nerve roots.

## 2019-05-28 NOTE — Patient Instructions (Addendum)
____________________________________________________________________________________________  Lysis of Epidural Adhesions Utilizing the Epidural Approach (RACZ Epidural Adhesiolysis)  Introduction Bleeding into the epidural space following surgery or leakage of disc material following breakage or a tear of a disc most commonly causes epidural scarring.  Presumably, inflammation and compression of nerve roots by epidural scar (Adhesions) are the mechanism of persistent pain following back surgery, ruptured or herniated discs, or vertebral body fracture.  Epidural scar may also contribute to the pain of spinal column metastatic carcinoma, failed facet joint syndrome, and unexplained neck or low back pain.   Diagnosis Conventional studies such as myelograms, computerized tomography (CT Scans), and magnetic resonance imaging (MRI), are usually inadequate to make the diagnosis.  Injection of contrast material (dye) into the epidural space yields an "epidurogram", which is diagnostic for the presence or absence of epidural scar tissue.   Procedure Since further surgery would only produce more scar tissue, a different approach to the problem needs to be taken.  "Lysis of Epidural Adhesions" (Breakage of scar tissue) is an alternative to this problem.  The procedure consists of introducing an epidural catheter (thin plastic tube) into the epidural space (space between your spinal cord and the walls of the spinal canal, within your backbone).  Once in place, medications are injected through this tubing in order to break the scar tissue.  Although the catheter is placed within fifteen to twenty minutes, it is kept in the epidural space for a couple of hours.  During this time, the injection of medications through the tubing is performed.  At the end of the procedure the catheter is removed and the patient discharged to home.   Conditions Usually Treated with This Modality Conditions include, but are not limited  to: Marland Kitchen Failed Back Surgery Syndrome . Post-Laminectomy Syndrome . Epidural Adhesions . Back and leg pain . Sciatica . Radiculopathy  Side Effects and Possible Complications Everything in medicine is subject to side effects and possible complications.  No two patients are alike.  Side effects and complications are not the same or equivalent to malpractice.  Malpractice refers to an injury sustained by a patient, which occurs as a consequence of negligence in the practice of medicine.  Side effects and complications, on the other hand, are untoward events, which can occur and may injure a patient, in certain percentages of the population.  These events can, and do occur even if everything goes according to plan, and in the absence of negligence or malpractice.  Possible side effects and complications of this procedure include, but are not limited to. . Pain or worsening of symptoms . Infection (local = abscess; or generalized = sepsis), including meningitis and death. Infection due to a steroid-induced immune system suppression. . Bleeding, including hematomas, which might compress the spinal cord, therefore causing paralysis. . Nerve damage, including sensory or motor weakness, and/or paralysis. Nerve damage, ranging from minor nerve irritation with pain, to major nerve damage with paralysis, impotence, urinary incontinence, and/or fecal incontinence. . Allergic reactions ranging from a minor rush to an anaphylactic reaction and death . Failure to relieve pain . Spinal cord compression leading to paralysis . Breakage of catheter (tubing) . Unforeseen events:   Guarantees That It Will Help None. There are no guarantees in medicine.   Results of Treatment 93.9% of patients will experience some pain relief, which will be variable.  6.1% will experience no relief.     Duration of Results Only 12.3% will experience persistent pain relief beyond 12 months.  57.9% of  male patients and 64.4% of  male patients will experience between 0-3 months of variable degrees of pain relief.   Frequently Asked Questions  What is the incidence of complications? It is unknown.  Most complications occur sporadically and are reported as isolated case reports.  Can the procedure be repeated when and if my pain returns? Yes.  The effectiveness of subsequent repeat procedures is also variable.  Some patients obtain longer duration of pain relief while others obtain shorter duration.  Does this require that I be hospitalized? No.  On the average the procedure takes anywhere from 1 to 3 hours to complete.  What should I expect to feel after the procedure? If the procedure is successful, you should experience pain relief of variable degrees.  Will I obtain complete (100%) pain relief? Although possible, it is our experience that patients suffering from chronic pain, the cause of the pain is multifactorial, and therefore, highly unlikely to completely address the problem with only one type of therapy.  What is an Epidurolysis (RACZ) Procedure? Epidurolysis (RACZ) Procedure is used to dissolve some of the scar tissue from around entrapped nerves in the Epidural space of spine, so that medications such as cortisone can reach the affected areas. Dr. Phillips Hay pioneered this procedure.   What causes scarring (adhesions)? Scarring is most commonly caused from bleeding into the Epidural space following back surgery and the subsequent healing process. It is a natural occurrence following surgical intervention. Sometimes scarring can also occur when a disk ruptures and its contents leak out.   What is the purpose of it? To allow medications to reach affected nerves so that pain and other symptoms may be diminished.   How long does the procedure take?  First, a small catheter (surgical tube) is inserted in the Epidural space up to the area where the scar is located. This is done under sterile conditions  using x-ray guidance. This epidural catheter is secured to the skin using tape and the patient is transported to the recovery room. This part may take 45 minutes. While in the recovery area, series of medications will be injected via the catheter. This part may take 1.0 to 2.0 hours. The patient is kept in the recovery room for the duration of the procedure. Once all medications are injected, then the catheter is removed. The actual injections only take a few minutes, however, the procedure protocol calls for certain periods of time to elapse between the administration of medications.   What is actually injected? The injection consists of a mixture of a local anesthetic (numbing medicine), a steroid (anti-inflammatory medication), radiological contrast dye (to visualize the scar tissue), hyaluronidase (medicine to soften the scar), and a hyper-concentrated solution sterile salt water (to remove fluid from the swollen nerves via osmosis).   Will the injection hurt? Patients receive intravenous sedation and local infiltration of numbing medicine to minimize the discomfort from the procedure. Because the procedure involves the insertion of a needle, some discomfort is to be expected. In addition, because there is scar tissue and swelling inside of the epidural space, as the medications are injected, the sensation of pressure should be expected.   Will I be "put out" for this procedure? No. This procedure is done under local anesthesia and sedation, which makes the procedure easy to tolerate. The amount of sedation given generally depends upon the patient's tolerance. Communication with the patients during the procedure is essential to avoid complications.   How is the procedure performed? Catheter placement  is done with the patient lying on their stomach. During the procedure, the patient is monitored with a heart monitor, blood pressure monitor, and a blood oxygen monitoring device. The skin in the back is  cleaned with antiseptic solution and then the procedure is carried out. After the procedure, you are placed on your back or on your side. X-rays are used to assist in the placement of the catheter and to perform the epidurogram (injection of radiological contrast to confirm adequate placement).   What should I expect after the injection? Immediately after the injection, you may feel your legs slightly heavy and numb. Also, you may notice that your pain may improve or disappear. This may only last for a few hours and it is due to the local anesthetics (numbing medicine).    When can I return to work? Unless there are any complications, you should be able to return to work the next day. However, it may take a couple of days for you to get the full benefit of the procedure.   How long the effects of the medication last? The immediate effect is usually from the local anesthetic injected. This wears off in a few hours. The cortisone starts working in about 5 to 10 days and its effect can last for several days to a few months. The benefits from the procedure may last as long as 6 month.   How many times do I need to have this procedure performed? If the first procedure does not relieve your symptoms within one to two weeks, you may be recommended to have one more procedure. After this, we recommend 6 months between [facility] procedures.   Will the Epidurolysis (RACZ) Procedure help me? It is very difficult to predict if the procedure will indeed help you or not. Generally speaking, the patients who have recent scarring (e.g. following back surgery) respond better.    What are the risks and side effects? Generally speaking, this procedure is relatively safe. However, with any procedure there are risks and possible complications. These include, but are not limited to: spinal headaches; central nervous system infections such as meningitis, or epidural abscesses; epidural hematomas with possible spinal cord  compression and subsequent permanent paralysis; worsening of symptoms; failure to provide relief; nerve damage; urinary or fecal incontinence; blood vessel damage, etc. Side effects related to the steroids, such as: weight gain; increase in blood sugar (mainly in diabetics); water retention; suppression of body's immune system; mood disturbances, etc.. Some of patients may have anaphylactic allergic reactions to hyaluronidase. Fortunately, the serious side effects and complications are uncommon.   Who should not have this procedure? If you are allergic to any of the medications to be injected, if you are on a blood thinning medication (e.i. Coumadin), or if you have an active infection going on, you should not have the injection. The following are absolute contraindications for performing epidural adhesiolysis: 1. Sepsis 2. Chronic infection 3. Coagulopathy (Bleeding disorder) 4. Local infection at the procedure site 5. Patient refusal 6. Syrinx formation 7. A relative contraindication is the presence of arachnoiditis.  How do I prepare for the procedure? 1. Do not eat or drink for 6 hours before the procedure. 2. If you take blood pressure medication in the morning, take it as usual, with just a sip of water. 3. Take a shower the morning of the procedure using an antibacterial soap such as "Right Guard". 4. Take a 1000 mg Vitamin C every day, before and after the procedure to  build up your immune system and decrease the risk of infections. 5. You may drink water up to 2 hours before the procedure. 6. Empty your bladder before getting your IV started.  ____________________________________________________________________________________________  ____________________________________________________________________________________________  Muscle Spasms & Cramps  Cause:  The most common cause of muscle spasms and cramps is vitamin and/or electrolyte (calcium, potassium, sodium, etc.)  deficiencies.  Possible triggers: Sweating - causes loss of electrolytes thru the skin. Steroids - causes loss of electrolytes thru the urine.  Treatment: 1. Gatorade (or any other electrolyte-replenishing drink) - Take 1, 8 oz glass with each meal (3 times a day). 2. OTC (over-the-counter) Magnesium 400 to 500 mg - Take 1 tablet twice a day (one with breakfast and one before bedtime). If you have kidney problems, talk to your primary care physician before taking any Magnesium. 3. Tonic Water with quinine - Take 1, 8 oz glass before bedtime.   ____________________________________________________________________________________________

## 2019-05-28 NOTE — Progress Notes (Signed)
Pain relief after procedure (treated area only): (Questions asked to patient) 1. Starting about 15 minutes after the procedure, and "while the area was still numb" (from the local anesthetics), were you having any of your usual pain "in that area" (the treated area)?  (NOTE: NOT including the discomfort from the needle sticks.) First 1 hour: 100 % better. First 4-6 hours: 100 % better. 2. How long did the numbness from the local anesthetics last? (More than 6 hours?) Duration: 8 hours.  3. How much better is your pain now, when compared to before the procedure? Current benefit: 50 % better. 4. Can you move better now? Improvement in ROM (Range of Motion): Yes. 5. Can you do more now? Improvement in function: Yes. 4. Did you have any problems with the procedure? Side-effects/Complications: No. 

## 2019-05-28 NOTE — Progress Notes (Signed)
Patient: Lance Roman  Service Category: E/M  Provider: Gaspar Cola, MD  DOB: 12/13/1960  DOS: 05/29/2019  Location: Office  MRN: [id number]  Setting: Ambulatory outpatient  Referring Provider: Hoyt Koch, *  Type: Established Patient  Specialty: Interventional Pain Management  PCP: Hoyt Koch, MD  Location: Remote location  Delivery: TeleHealth     Virtual Encounter - Pain Management PROVIDER NOTE: Information contained herein reflects review and annotations entered in association with encounter. Interpretation of such information and data should be left to medically-trained personnel. Information provided to patient can be located elsewhere in the medical record under "Patient Instructions". Document created using STT-dictation technology, any transcriptional errors that may result from process are unintentional.    Contact & Pharmacy Preferred: [phone] Home: [phone] (home) Mobile: [phone] (mobile) E-mail: [email or web address]  [place], [place] [address] Phone: [phone] Fax: [phone]   Pre-screening  Lance Roman offered "in-person" vs "virtual" encounter. He indicated preferring virtual for this encounter.   Reason COVID-19*  Social distancing based on CDC and AMA recommendations.   I contacted Lance Roman on 05/29/2019 via telephone.      I clearly identified myself as Gaspar Cola, MD. I verified that I was speaking with the correct person using two identifiers (Name: Lance Roman, and date of birth: 03/05/1961).  Consent I sought verbal advanced consent from Lance Roman for virtual visit interactions. I informed Lance Roman of possible security and privacy concerns, risks, and limitations associated with providing "not-in-person" medical evaluation and management services. I also informed Lance Roman of the availability of "in-person"  appointments. Finally, I informed him that there would be a charge for the virtual visit and that he could be  personally, fully or partially, financially responsible for it. Mr. Gerhold expressed understanding and agreed to proceed.   Historic Elements   Lance Roman is a 59 y.o. year old, male patient evaluated today after his last contact with our practice on 05/18/2019. Lance Roman  has a past medical history of Acute postoperative pain (11/21/2017), Allergic rhinitis, Chronic pain syndrome, DDD (degenerative disc disease), lumbosacral, GERD (gastroesophageal reflux disease), Hyperlipidemia, Osteoarthritis, and S/P appendectomy. He also  has a past surgical history that includes Appendectomy; repairs of bilateral ankle fractures; Spinal fusion (N/A); and Spinal cord stimulator implant (N/A). Lance Roman has a current medication list which includes the following prescription(s): baclofen, [START ON 07/15/2019] baclofen, celecoxib, [START ON 07/15/2019] celecoxib, esomeprazole, gabapentin, [START ON 07/15/2019] gabapentin, ibuprofen, [START ON 06/17/2019] oxycodone hcl, [START ON 07/17/2019] oxycodone hcl, [START ON 08/16/2019] oxycodone hcl, [START ON 09/15/2019] oxycodone hcl, and prednisone. He  reports that he has quit smoking. His smokeless tobacco use includes chew. He reports current alcohol use of about 3.0 standard drinks of alcohol per week. He reports that he does not use drugs. Lance Roman has No Known Allergies.   HPI  Today, he is being contacted for both, medication management and a post-procedure assessment.  Post-Procedure Evaluation  Procedure (05/18/2019): Diagnostic caudal ESI #1 + diagnostic epidurogram #1 fluoroscopic guidance and local anesthesia Pre-procedure pain level:  4/10 Post-procedure: 1/10 (> 50% relief)  Sedation: None.  Dewayne Shorter, RN  05/28/2019  8:53 AM  Signed Pain relief after procedure (treated area only): (Questions asked to patient) 1. Starting about 15 minutes  after the procedure, and "while the area was still numb" (from the local anesthetics), were you  having any of your usual pain "in that area" (the treated area)?  (NOTE: NOT including the discomfort from the needle sticks.) First 1 hour: 100 % better. First 4-6 hours: 100 % better. 2. How long did the numbness from the local anesthetics last? (More than 6 hours?) Duration: 8 hours.  3. How much better is your pain now, when compared to before the procedure? Current benefit: 50 % better. 4. Can you move better now? Improvement in ROM (Range of Motion): Yes. 5. Can you do more now? Improvement in function: Yes. 4. Did you have any problems with the procedure? Side-effects/Complications: No.  Current benefits: Defined as benefit that persist at this time.   Analgesia:  50% improved Function: Lance Roman reports improvement in function ROM: Lance Roman reports improvement in ROM  Pharmacotherapy Assessment  Analgesic: Oxycodone IR 10 mg, 1 tab PO q 6 hrs (40 mg/day of oxycodone) MME/day:'60mg'$ /day.   Monitoring: [place] PMP: PDMP reviewed during this encounter.       Pharmacotherapy: No side-effects or adverse reactions reported. Compliance: No problems identified. Effectiveness: Clinically acceptable. Plan: Refer to "POC".  UDS:  Summary  Date Value Ref Range Status  03/15/2018 FINAL  Final    Comment:    ==================================================================== TOXASSURE SELECT 13 (MW) ==================================================================== Test                             Result       Flag       Units Drug Present and Declared for Prescription Verification   Oxycodone                      4720         EXPECTED   ng/mg creat   Oxymorphone                    688          EXPECTED   ng/mg creat   Noroxycodone                   6555         EXPECTED   ng/mg creat   Noroxymorphone                 249          EXPECTED   ng/mg creat    Sources of oxycodone are  scheduled prescription medications.    Oxymorphone, noroxycodone, and noroxymorphone are expected    metabolites of oxycodone. Oxymorphone is also available as a    scheduled prescription medication. ==================================================================== Test                      Result    Flag   Units      Ref Range   Creatinine              86               mg/dL      >=20 ==================================================================== Declared Medications:  The flagging and interpretation on this report are based on the  following declared medications.  Unexpected results may arise from  inaccuracies in the declared medications.  **Note: The testing scope of this panel includes these medications:  Oxycodone  **Note: The testing scope of this panel does not include following  reported medications:  Celecoxib  Esomeprazole  Gabapentin  Ibuprofen  Olopatadine ==================================================================== For clinical  consultation, please call [phone]. ====================================================================    Laboratory Chemistry Profile   Renal Lab Results  Component Value Date   BUN 20 12/12/2016   CREATININE 0.74 (L) 12/12/2016   BCR 27 (H) 12/12/2016   GFR 96.24 12/30/2014   GFRAA 120 12/12/2016   GFRNONAA 104 12/12/2016    Hepatic Lab Results  Component Value Date   AST 21 12/12/2016   ALT 12 12/30/2014   ALBUMIN 4.5 12/12/2016   ALKPHOS 66 12/12/2016   HCVAB NEGATIVE 12/30/2014    Electrolytes Lab Results  Component Value Date   NA 140 12/12/2016   K 4.5 12/12/2016   CL 99 12/12/2016   CALCIUM 9.7 12/12/2016   MG 1.9 12/12/2016    Bone Lab Results  Component Value Date   25OHVITD1 44 12/12/2016   25OHVITD2 <1.0 12/12/2016   25OHVITD3 43 12/12/2016    Inflammation (CRP: Acute Phase) (ESR: Chronic Phase) Lab Results  Component Value Date   CRP 2.5 12/12/2016   ESRSEDRATE 10 12/12/2016       Note: Above Lab results reviewed.  Imaging  DG PAIN CLINIC C-ARM 1-60 MIN NO REPORT Fluoro was used, but no Radiologist interpretation will be provided.  Please refer to "NOTES" tab for provider progress note.        Assessment  The primary encounter diagnosis was Failed back surgical syndrome (Fusion L4-S1). Diagnoses of Epidural fibrosis, Chronic low back pain (Primary Area of Pain) (Bilateral) (L>R), Chronic lower extremity pain (Secondary Area of Pain) (Left), Chronic pain syndrome, Chronic musculoskeletal pain, Muscle cramps, Neurogenic pain, and Osteoarthritis of lumbar spine were also pertinent to this visit.  Plan of Care  Problem-specific:  Epidural fibrosis Diagnostic caudal epidurogram done on 05/14/2019 confirms epidural fibrosis with obstruction to flow into the area of the left S1, and both L5 nerve roots.  Lance Roman has a current medication list which includes the following long-term medication(s): baclofen, [START ON 07/15/2019] baclofen, celecoxib, [START ON 07/15/2019] celecoxib, esomeprazole, gabapentin, [START ON 07/15/2019] gabapentin, [START ON 06/17/2019] oxycodone hcl, [START ON 07/17/2019] oxycodone hcl, [START ON 08/16/2019] oxycodone hcl, and [START ON 09/15/2019] oxycodone hcl.  Pharmacotherapy (Medications Ordered): Meds ordered this encounter  Medications  . baclofen (LIORESAL) 10 MG tablet    Sig: Take 1 tablet (10 mg total) by mouth 3 (three) times daily AND 2 tablets (20 mg total) at bedtime.    Dispense:  150 tablet    Refill:  5    Fill one day early if pharmacy is closed on scheduled refill date. May substitute for generic if available.  . Oxycodone HCl 10 MG TABS    Sig: Take 1 tablet (10 mg total) by mouth every 6 (six) hours as needed. Must last 30 days.    Dispense:  120 tablet    Refill:  0    Chronic Pain: STOP Act (Not applicable) Fill 1 day early if closed on refill date. Do not fill until: 07/17/2019. To last until: 08/16/2019. Avoid  benzodiazepines within 8 hours of opioids  . celecoxib (CELEBREX) 200 MG capsule    Sig: Take 1 capsule (200 mg total) by mouth 2 (two) times daily.    Dispense:  180 capsule    Refill:  1    Do not place medication on "Automatic Refill". Fill one day early if pharmacy is closed on scheduled refill date.  . gabapentin (NEURONTIN) 300 MG capsule    Sig: Take 2-3 capsules (600-900 mg total) by mouth every 8 (eight) hours.  Dispense:  270 capsule    Refill:  5    Fill one day early if pharmacy is closed on scheduled refill date. May substitute for generic if available.  . Oxycodone HCl 10 MG TABS    Sig: Take 1 tablet (10 mg total) by mouth every 6 (six) hours as needed. Must last 30 days.    Dispense:  120 tablet    Refill:  0    Chronic Pain: STOP Act (Not applicable) Fill 1 day early if closed on refill date. Do not fill until: 08/16/2019. To last until: 09/15/2019. Avoid benzodiazepines within 8 hours of opioids  . Oxycodone HCl 10 MG TABS    Sig: Take 1 tablet (10 mg total) by mouth every 6 (six) hours as needed. Must last 30 days.    Dispense:  120 tablet    Refill:  0    Chronic Pain: STOP Act (Not applicable) Fill 1 day early if closed on refill date. Do not fill until: 09/15/2019. To last until: 10/15/2019. Avoid benzodiazepines within 8 hours of opioids   Orders:  Orders Placed This Encounter  Procedures  . Racz Epidurolysis    Standing Status:   Standing    Number of Occurrences:   1    Standing Expiration Date:   11/27/2020    Scheduling Instructions:     Procedure: RACZ Epidural Lysis of Adhesions     Side: Midline     Sedation: Moderate Conscious Sedation     Scheduling Timeframe: The patient will call to schedule (PRN)    Order Specific Question:   Where will this procedure be performed?    Answer:   [facility]   Follow-up plan:   Return in about 5 months (around 10/14/2019) for (VV), (MM), in addition, PRN Procedure(s): (L) RACZ procedure.      Interventional  options:  Considering:   Therapeutic revision of replacement of nonfunctional SCS (Fractured leads).  On 06/30/2018 he was referred to Clydell Hakim, MD for this procedure.  Diagnostic caudal epidural steroid injection+diagnostic epidurogram #1 Possible Racz procedure #1   Palliative PRN treatment(s):   Palliative bilateral lumbar facet block #3  Palliativeright lumbar facetRFA#2(last done on 01/23/18)  Palliativeleft lumbar facetRFA#2(last done 11/21/17)  Therapeutic left caudal ESI #2 (100/100/50)    Recent Visits Date Type Provider Dept  05/14/19 Procedure visit Milinda Pointer, MD [facility]  04/10/19 Telemedicine Milinda Pointer, MD [facility]  Showing recent visits within past 90 days and meeting all other requirements   Today's Visits Date Type Provider Dept  05/29/19 Telemedicine Milinda Pointer, MD [facility]  Showing today's visits and meeting all other requirements   Future Appointments No visits were found meeting these conditions.  Showing future appointments within next 90 days and meeting all other requirements   I discussed the assessment and treatment plan with the patient. The patient was provided an opportunity to ask questions and all were answered. The patient agreed with the plan and demonstrated an understanding of the instructions.  Patient advised to call back or seek an in-person evaluation if the symptoms or condition worsens.  Duration of encounter: 15 minutes.  Note by: Gaspar Cola, MD Date: 05/29/2019; Time: 8:17 AM

## 2019-05-29 ENCOUNTER — Ambulatory Visit: Payer: No Typology Code available for payment source | Attending: Pain Medicine | Admitting: Pain Medicine

## 2019-05-29 ENCOUNTER — Other Ambulatory Visit: Payer: Self-pay

## 2019-05-29 DIAGNOSIS — M79605 Pain in left leg: Secondary | ICD-10-CM

## 2019-05-29 DIAGNOSIS — M47816 Spondylosis without myelopathy or radiculopathy, lumbar region: Secondary | ICD-10-CM

## 2019-05-29 DIAGNOSIS — M5442 Lumbago with sciatica, left side: Secondary | ICD-10-CM

## 2019-05-29 DIAGNOSIS — G894 Chronic pain syndrome: Secondary | ICD-10-CM

## 2019-05-29 DIAGNOSIS — M7918 Myalgia, other site: Secondary | ICD-10-CM

## 2019-05-29 DIAGNOSIS — R252 Cramp and spasm: Secondary | ICD-10-CM

## 2019-05-29 DIAGNOSIS — G8929 Other chronic pain: Secondary | ICD-10-CM

## 2019-05-29 DIAGNOSIS — G96198 Other disorders of meninges, not elsewhere classified: Secondary | ICD-10-CM | POA: Diagnosis not present

## 2019-05-29 DIAGNOSIS — M961 Postlaminectomy syndrome, not elsewhere classified: Secondary | ICD-10-CM | POA: Diagnosis not present

## 2019-05-29 DIAGNOSIS — Z981 Arthrodesis status: Secondary | ICD-10-CM

## 2019-05-29 DIAGNOSIS — M792 Neuralgia and neuritis, unspecified: Secondary | ICD-10-CM

## 2019-05-29 MED ORDER — PREDNISONE 10 MG (21) PO TBPK: ORAL_TABLET | Freq: Every day | ORAL | 0 refills | Status: DC

## 2019-05-29 NOTE — Addendum Note (Signed)
Addended by: Karrie Meres on: 05/29/2019 11:03 AM   Modules accepted: Orders

## 2019-05-30 ENCOUNTER — Ambulatory Visit: Payer: BC Managed Care – PPO | Admitting: Internal Medicine

## 2019-05-30 DIAGNOSIS — Z0289 Encounter for other administrative examinations: Secondary | ICD-10-CM

## 2019-06-11 ENCOUNTER — Telehealth: Payer: Self-pay | Admitting: Pain Medicine

## 2019-06-11 NOTE — Telephone Encounter (Signed)
Joey from [facility]) called asking to set up peer to peer with Dr. Laban Emperor. They want to discuss current and future Medications. Would like Dr. Laban Emperor to call [phone] to set this up

## 2019-07-02 IMAGING — CT CT L SPINE W/O CM
3 series · 9 of 33 positions shown, 11 images · non-contrast
Comparison: None

CLINICAL DATA: Previous lumbar fusion. Recurrent low back pain
extending into the lower extremities bilaterally. Left S1
radiculopathy.
TECHNIQUE: Contiguous axial images were obtained through the Lumbar spine after
the intrathecal infusion of infusion. Coronal and sagittal
reconstructions were obtained of the axial image sets.

[Series 8: l-spine 2.0 cor bone · coronal · 0.30mm/px · 3 of 61 slices shown]
[im 13/61  bone]
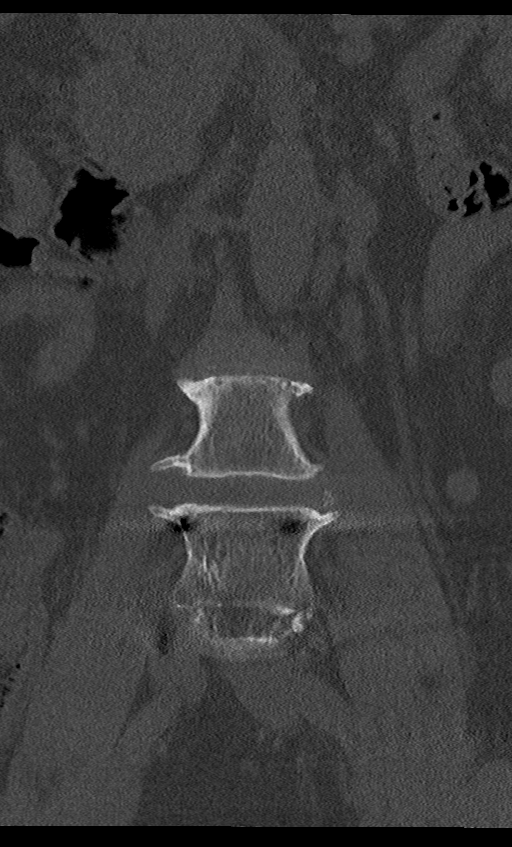
[im 25/61  bone]
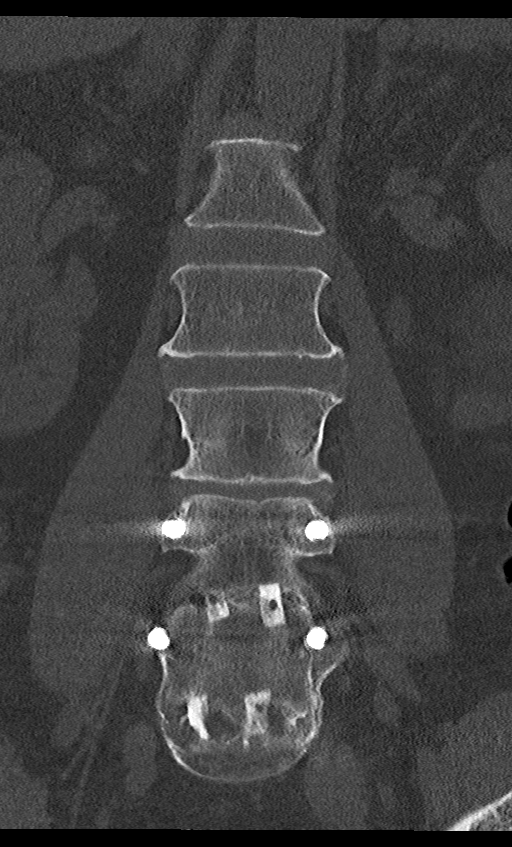
[im 37/61  bone]
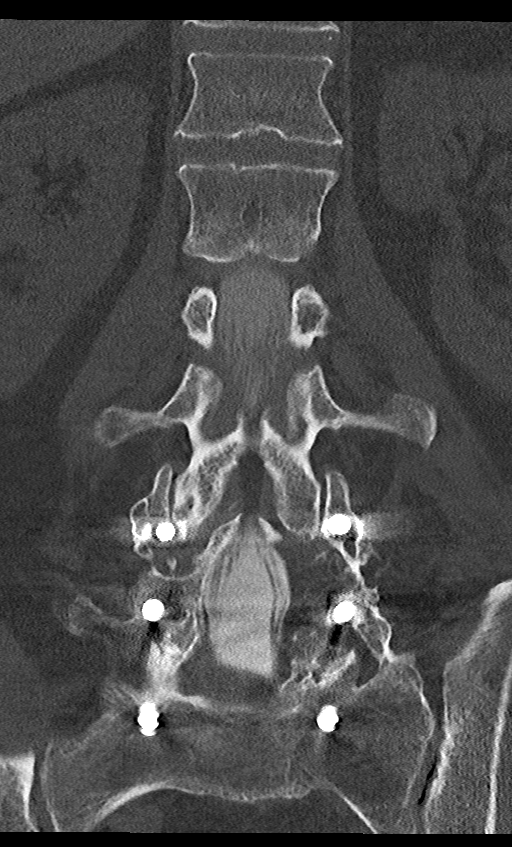

[Series 9: l-spine 2.0 sag bone · sagittal · 0.27mm/px · 5 of 73 slices shown, 6 images]
[im 25/73  bone]
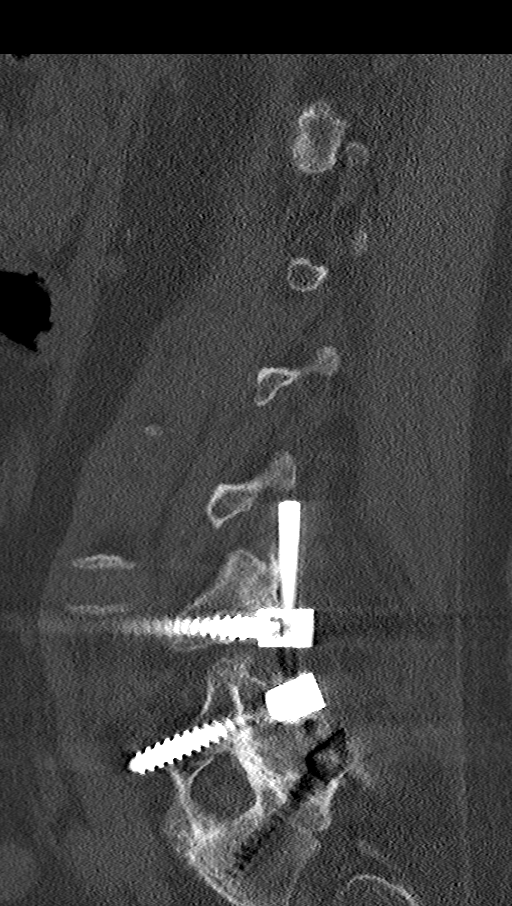
[im 31/73  bone]
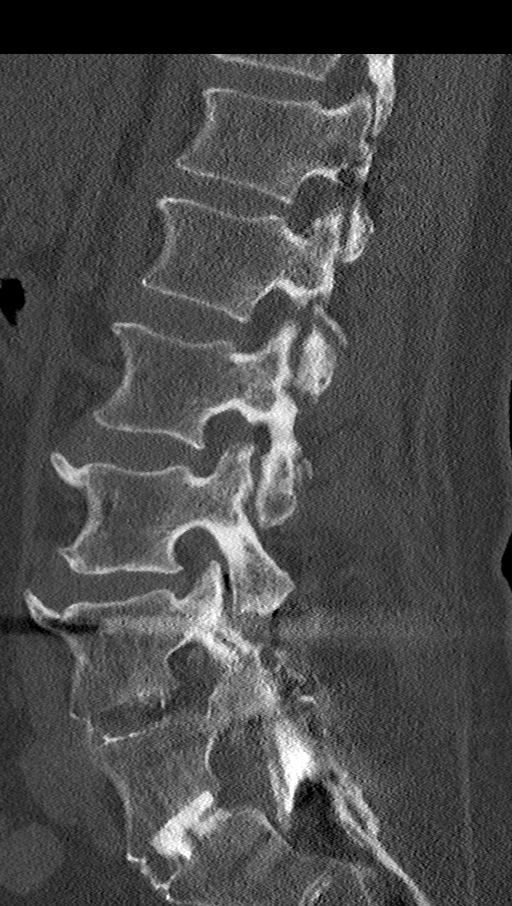
[im 37/73  soft-tissue]
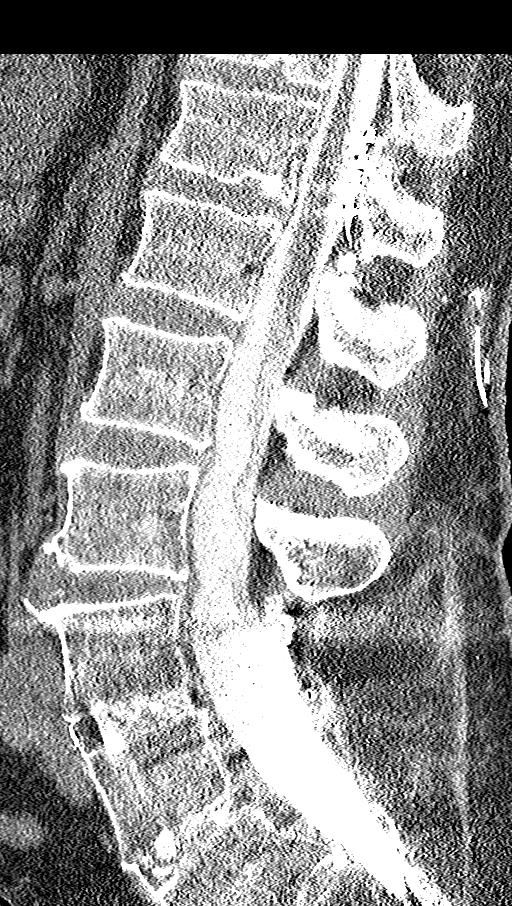
[im 37/73  bone]
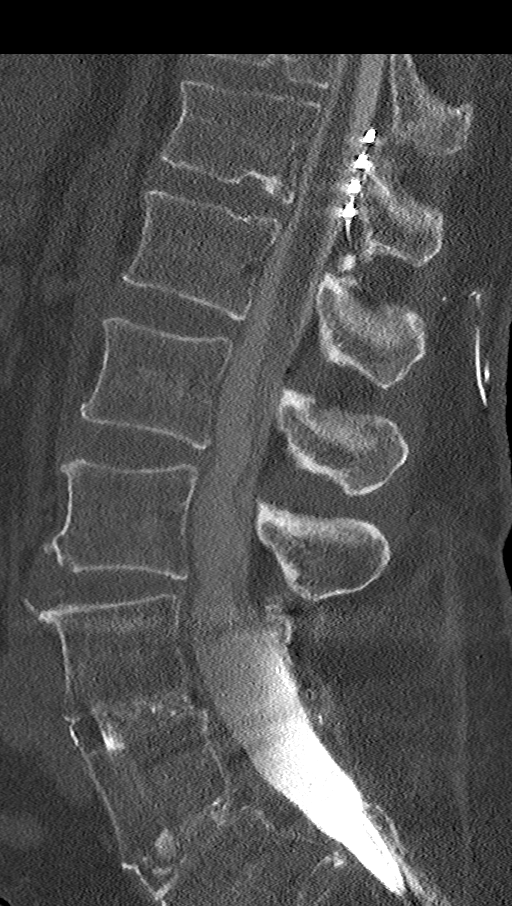
[im 43/73  bone]
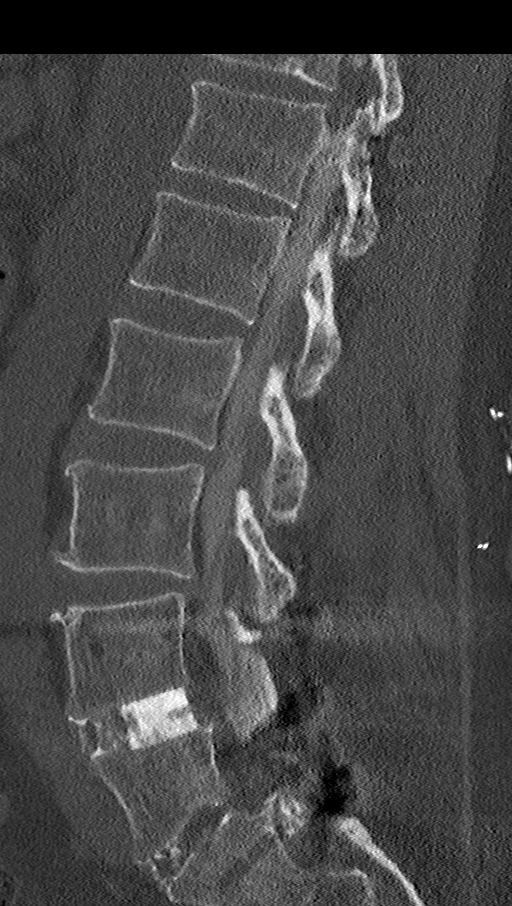
[im 49/73  bone]
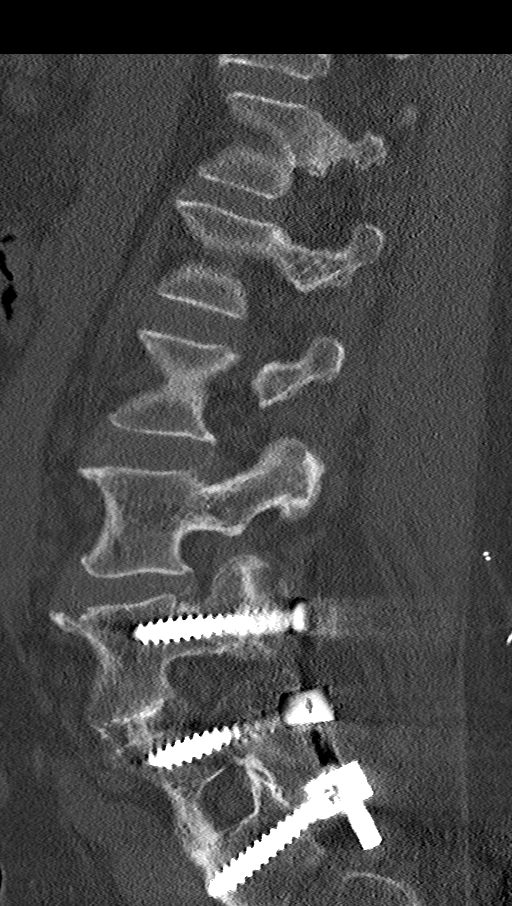

[Series 15: l-spine 2.0 (person_name) (person_name) · axial · 0.37mm/px · z∈[-180,-180]mm · 1 of 105 slices shown, 2 images]
[im 57/105  soft-tissue]
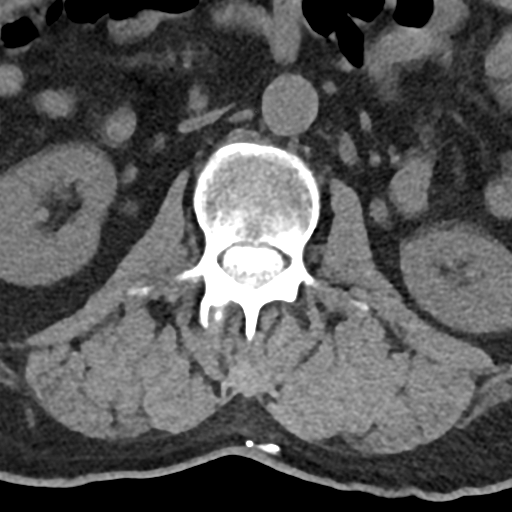
[im 57/105  bone]
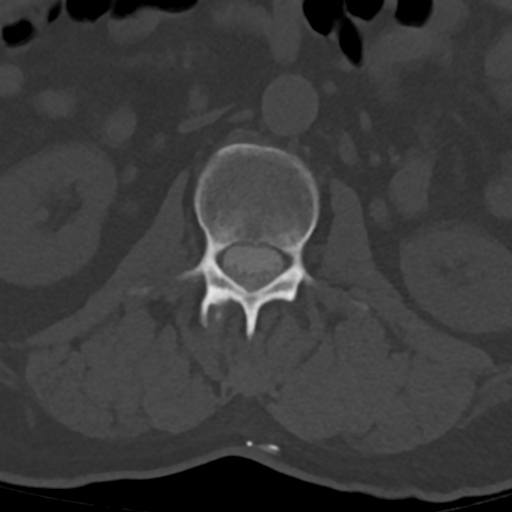

[9 of 33 positions shown; findings below may reference images not displayed]

EXAM:
LUMBAR MYELOGRAM

FLUOROSCOPY TIME:  Radiation Exposure Index (as provided by the
fluoroscopic device): 1,814 uGy*m2

Fluoroscopy Time:  48 seconds

Number of Acquired Images:  14

PROCEDURE:
After thorough discussion of risks and benefits of the procedure
including bleeding, infection, injury to nerves, blood vessels,
adjacent structures as well as headache and CSF leak, written and
oral informed consent was obtained. Consent was obtained by Dr.
Sakina Tri. Time out form was completed.

Patient was positioned prone on the fluoroscopy table. Local
anesthesia was provided with 1% lidocaine without epinephrine after
prepped and draped in the usual sterile fashion. Puncture was
performed at L1-2 using a 3 1/2 inch 22-gauge spinal needle via
right paramedian approach. Using a single pass through the dura, the
needle was placed within the thecal sac, with return of clear CSF.
15 mL of Isovue I-[id number] was injected into the thecal sac, with normal
opacification of the nerve roots and cauda equina consistent with
free flow within the subarachnoid space.

I personally performed the lumbar puncture and administered the
intrathecal contrast. I also personally supervised acquisition of
the myelogram images.
FINDINGS: LUMBAR MYELOGRAM FINDINGS:

Lumbar fusion is noted at L4-5 and L5-S1. Spinal cord stimulator
enters at T12-L1. The stimulator wires are fragmented. Fusion
hardware is intact.

Mild subarticular narrowing is present bilaterally at L1-2 and on
the right at L2-3.

Lower lumbar nerve roots fill normally on both sides.

Slight retrolisthesis is present at L3-4 and L2-3. This is
exaggerated with standing. Increased uncovering of broad-based disc
protrusions is present at L2-3 and L3-4 with standing. The
retrolisthesis is worse with extension and reduced in flexion. The
disc disease with standing and extension is greatest at L3-4.

CT LUMBAR MYELOGRAM FINDINGS:

The lumbar spine is imaged from T11-12 through S1-2.

Schmorl's nodes are present at T11-12 and T12-L1. Vertebral body
heights are normal. There is solid fusion anteriorly and posteriorly
at L4-5 and L5-S1.

Slight retrolisthesis is present at L3-4. AP alignment is otherwise
anatomic.

Limited imaging of the abdomen is unremarkable.

L1-2: Facet hypertrophy is worse on the right. No significant focal
disc protrusion or stenosis is present.

L2-3: A mild broad-based disc protrusion is present. Moderate facet
hypertrophy is noted bilaterally. Focal stenosis is present on the
supine images.

L3-4: A left paramedian central disc protrusion is present. Moderate
facet hypertrophy is noted. Mild foraminal narrowing is worse on the
right. The central canal is patent.

L4-5: Left laminectomy and facetectomy are noted. Solid fusion is
present anteriorly and posteriorly. No residual or recurrent
stenosis is present.

L5-S1: Bilateral laminectomies are noted. Solid fusion is present
anteriorly and posteriorly. The nerve roots are distributed
peripherally. No residual stenosis is present.
IMPRESSION: 1. Solid fusion at L4-5 and L5-S1 without residual or recurrent
stenosis at these levels.
2. Adjacent level disease at L3-4. Central disc protrusion and
bilateral facet hypertrophy is noted on the CT scan. Retrolisthesis
is exaggerated when the patient stands and extends contributing to
dynamic central canal and likely foraminal stenosis.
3. More mild retrolisthesis at L2-3 is also worse with standing and
extension.
4. Mild facet hypertrophy at L1-2 without significant stenosis.
5. Spinal cord stimulator is in place. The leads are fragmented as
previously noted.

## 2019-07-10 ENCOUNTER — Ambulatory Visit: Payer: No Typology Code available for payment source | Admitting: Pain Medicine

## 2019-10-10 ENCOUNTER — Encounter: Payer: Self-pay | Admitting: Pain Medicine

## 2019-10-13 NOTE — Progress Notes (Signed)
Patient: Lance Roman  Service Category: E/M  Provider: Gaspar Cola, MD  DOB: [birth year]  DOS: 10/14/2019  Location: Office  MRN: [id number]  Setting: Ambulatory outpatient  Referring Provider: Hoyt Roman, *  Type: Established Patient  Specialty: Interventional Pain Management  PCP: Lance Koch, MD  Location: Remote location  Delivery: TeleHealth     Virtual Encounter - Pain Management PROVIDER NOTE: Information contained herein reflects review and annotations entered in association with encounter. Interpretation of such information and data should be left to medically-trained personnel. Information provided to patient can be located elsewhere in the medical record under "Patient Instructions". Document created using STT-dictation technology, any transcriptional errors that may result from process are unintentional.    Contact & Pharmacy Preferred: [phone] Home: [phone] (home) Mobile: [phone] (mobile) E-mail: [email or web address]  [place], [place] [address] Phone: [phone] Fax: [phone]   Pre-screening  Lance Roman offered "in-person" vs "virtual" encounter. He indicated preferring virtual for this encounter.   Reason COVID-19*  Social distancing based on CDC and AMA recommendations.   I contacted Lance Roman on 10/14/2019 via telephone.      I clearly identified myself as Lance Cola, MD. I verified that I was speaking with the correct person using two identifiers (Name: Lance Roman, and date of birth: Nov 01, 1960).  Consent I sought verbal advanced consent from Lance Roman for virtual visit interactions. I informed Lance Roman of possible security and privacy concerns, risks, and limitations associated with providing "not-in-person" medical evaluation and management services. I also informed Lance Roman of the availability of "in-person"  appointments. Finally, I informed him that there would be a charge for the virtual visit and that he could be  personally, fully or partially, financially responsible for it. Lance Roman expressed understanding and agreed to proceed.   Historic Elements   Lance Roman is a 59 y.o. year old, male patient evaluated today after his last contact with our practice on 06/11/2019. Lance Roman  has a past medical history of Acute postoperative pain (11/21/2017), Allergic rhinitis, Chronic pain syndrome, DDD (degenerative disc disease), lumbosacral, GERD (gastroesophageal reflux disease), Hyperlipidemia, Osteoarthritis, and S/P appendectomy. He also  has a past surgical history that includes Appendectomy; repairs of bilateral ankle fractures; Spinal fusion (N/A); and Spinal cord stimulator implant (N/A). Lance Roman has a current medication list which includes the following prescription(s): baclofen, celecoxib, esomeprazole, gabapentin, ibuprofen, oxycodone hcl, [START ON 10/15/2019] oxycodone, and [START ON 10/15/2019] oxycodone. He  reports that he has quit smoking. His smokeless tobacco use includes chew. He reports current alcohol use of about 3.0 standard drinks of alcohol per week. He reports that he does not use drugs. Lance Roman has No Known Allergies.   HPI  Today, he is being contacted for medication management.  Again the patient brought up the topic of the medications not lasting long.  This keeps being a problem with him in the sense that he seems to develop tolerance rather quickly.  I have explained to the patient that the solution for tolerance is not to increase the dose or change medication around but to simply do a "Drug Holiday" whenever tolerance develops.  The problem is that he does not want to do that since he says that it is torture.  Here the problem is that I believe that when we did the last drug holiday he simply did not follow  my instructions on how to go down on the opioids and he did it  a lot quicker than he should have and therefore developed withdrawals.  In addition, he has continued to have pain and his spinal cord stimulator is not working.  To compound things even further what we have is that he is a Designer, jewellery case and for some reason, several times have mentioned that we need to go ahead and have the spinal cord stimulator revised then fixed or removed.  Neither of these seem to be happening.  He refers that he does not tolerate taking the gabapentin during the day and therefore what he does is he takes 1200 mg at bedtime.  However, he says that when he is on call, he cannot take it and therefore every 7 days, he does not take his medication.  (He indicates that he is on call every 7 days)  We are pending to have an evaluation with his caseworker so that some of this can be discussed.  Hopefully we can do this on his next evaluation since it will be a face-to-face medication management visit.  Pharmacotherapy Assessment  Analgesic: Oxycodone IR 10 mg, 1 tab PO q 6 hrs (40 mg/day of oxycodone) MME/day:57m/day.   Monitoring: [place] PMP: PDMP reviewed during this encounter.       Pharmacotherapy: No side-effects or adverse reactions reported. Compliance: No problems identified. Effectiveness: Clinically acceptable. Plan: Refer to "POC".  UDS:  Summary  Date Value Ref Range Status  03/15/2018 FINAL  Final    Comment:    ==================================================================== TOXASSURE SELECT 13 (MW) ==================================================================== Test                             Result       Flag       Units Drug Present and Declared for Prescription Verification   Oxycodone                      4720         EXPECTED   ng/mg creat   Oxymorphone                    688          EXPECTED   ng/mg creat   Noroxycodone                   6555         EXPECTED   ng/mg creat   Noroxymorphone                 249          EXPECTED    ng/mg creat    Sources of oxycodone are scheduled prescription medications.    Oxymorphone, noroxycodone, and noroxymorphone are expected    metabolites of oxycodone. Oxymorphone is also available as a    scheduled prescription medication. ==================================================================== Test                      Result    Flag   Units      Ref Range   Creatinine              86               mg/dL      >=20 ==================================================================== Declared Medications:  The flagging and interpretation on this report are based on the  following declared medications.  Unexpected results may arise from  inaccuracies in the declared medications.  **Note: The testing scope of this panel includes these medications:  Oxycodone  **Note: The testing scope of this panel does not include following  reported medications:  Celecoxib  Esomeprazole  Gabapentin  Ibuprofen  Olopatadine ==================================================================== For clinical consultation, please call [phone]. ====================================================================     Laboratory Chemistry Profile   Renal Lab Results  Component Value Date   BUN 20 12/12/2016   CREATININE 0.74 (L) 12/12/2016   BCR 27 (H) 12/12/2016   GFR 96.24 12/30/2014   GFRAA 120 12/12/2016   GFRNONAA 104 12/12/2016     Hepatic Lab Results  Component Value Date   AST 21 12/12/2016   ALT 12 12/30/2014   ALBUMIN 4.5 12/12/2016   ALKPHOS 66 12/12/2016   HCVAB NEGATIVE 12/30/2014     Electrolytes Lab Results  Component Value Date   NA 140 12/12/2016   K 4.5 12/12/2016   CL 99 12/12/2016   CALCIUM 9.7 12/12/2016   MG 1.9 12/12/2016     Bone Lab Results  Component Value Date   25OHVITD1 44 12/12/2016   25OHVITD2 <1.0 12/12/2016   25OHVITD3 43 12/12/2016     Inflammation (CRP: Acute Phase) (ESR: Chronic Phase) Lab Results  Component Value Date    CRP 2.5 12/12/2016   ESRSEDRATE 10 12/12/2016       Note: Above Lab results reviewed.   Imaging  DG PAIN CLINIC C-ARM 1-60 MIN NO REPORT Fluoro was used, but no Radiologist interpretation will be provided.  Please refer to "NOTES" tab for provider progress note.  Assessment  The primary encounter diagnosis was Chronic pain syndrome. Diagnoses of Chronic low back pain (Primary Area of Pain) (Bilateral) (L>R), Chronic lower extremity pain (Secondary Area of Pain) (Left), and Pharmacologic therapy were also pertinent to this visit.  Plan of Care  Problem-specific:  No problem-specific Assessment & Plan notes found for this encounter.  Mr. ERVEY FALLIN has a current medication list which includes the following long-term medication(s): baclofen, celecoxib, esomeprazole, gabapentin, oxycodone hcl, [START ON 10/15/2019] oxycodone, and [START ON 10/15/2019] oxycodone.  Pharmacotherapy (Medications Ordered): Meds ordered this encounter  Medications  . oxyCODONE (OXYCONTIN) 10 mg 12 hr tablet    Sig: Take 1 tablet (10 mg total) by mouth every 12 (twelve) hours. Must last 30 days.    Dispense:  60 tablet    Refill:  0    Chronic Pain: STOP Act (Not applicable) Fill 1 day early if closed on refill date. Do not fill until: 11/14/2019. To last until: 11/14/2019. Avoid benzodiazepines within 8 hours of opioids  . oxyCODONE (OXY IR/ROXICODONE) 5 MG immediate release tablet    Sig: Take 1 tablet (5 mg total) by mouth every 6 (six) hours as needed for severe pain. Must last 30 days.    Dispense:  120 tablet    Refill:  0    Chronic Pain: STOP Act (Not applicable) Fill 1 day early if closed on refill date. Do not fill until: 10/15/2019. To last until: 11/14/2019. Avoid benzodiazepines within 8 hours of opioids   Orders:  Orders Placed This Encounter  Procedures  . ToxASSURE Select 13 (MW), Urine    Volume: 30 ml(s). Minimum 3 ml of urine is needed. Document temperature of fresh  sample. Indications: Long term (current) use of opiate analgesic (Q33.007)    Order Specific Question:   Release to patient    Answer:   Immediate  Follow-up plan:   Return in about 30 days (around 11/13/2019) for F2F(20-min), MM (never on procedure day) to check on UDS.      Interventional options:  Considering:   Therapeutic revision of replacement of nonfunctional SCS (Fractured leads).  On 06/30/2018 he was referred to Clydell Hakim, MD for this procedure.  Diagnostic caudal epidural steroid injection+diagnostic epidurogram #1 Possible Racz procedure #1   Palliative PRN treatment(s):   Palliative bilateral lumbar facet block #3  Palliativeright lumbar facetRFA#2(last done on 01/23/18)  Palliativeleft lumbar facetRFA#2(last done 11/21/17)  Therapeutic left caudal ESI #2 (100/100/50)     Recent Visits No visits were found meeting these conditions. Showing recent visits within past 90 days and meeting all other requirements Today's Visits Date Type Provider Dept  10/14/19 Telemedicine Milinda Pointer, MD [facility]  Showing today's visits and meeting all other requirements Future Appointments No visits were found meeting these conditions. Showing future appointments within next 90 days and meeting all other requirements  I discussed the assessment and treatment plan with the patient. The patient was provided an opportunity to ask questions and all were answered. The patient agreed with the plan and demonstrated an understanding of the instructions.  Patient advised to call back or seek an in-person evaluation if the symptoms or condition worsens.  Duration of encounter: 18 minutes.  Note by: Lance Cola, MD Date: 10/14/2019; Time: 9:31 AM

## 2019-10-14 ENCOUNTER — Ambulatory Visit: Payer: BC Managed Care – PPO | Attending: Pain Medicine | Admitting: Pain Medicine

## 2019-10-14 DIAGNOSIS — G894 Chronic pain syndrome: Secondary | ICD-10-CM | POA: Diagnosis not present

## 2019-10-14 DIAGNOSIS — Z79899 Other long term (current) drug therapy: Secondary | ICD-10-CM | POA: Diagnosis not present

## 2019-10-14 DIAGNOSIS — M79605 Pain in left leg: Secondary | ICD-10-CM | POA: Diagnosis not present

## 2019-10-14 DIAGNOSIS — M5442 Lumbago with sciatica, left side: Secondary | ICD-10-CM

## 2019-10-14 DIAGNOSIS — G8929 Other chronic pain: Secondary | ICD-10-CM

## 2019-10-14 MED ORDER — OXYCODONE HCL ER 10 MG PO T12A: 10.0000 mg | EXTENDED_RELEASE_TABLET | Freq: Two times a day (BID) | ORAL | 0 refills | Status: DC

## 2019-10-14 MED ORDER — OXYCODONE HCL 5 MG PO TABS: 5.0000 mg | ORAL_TABLET | Freq: Four times a day (QID) | ORAL | 0 refills | Status: DC | PRN

## 2019-10-16 DIAGNOSIS — Z79899 Other long term (current) drug therapy: Secondary | ICD-10-CM | POA: Diagnosis not present

## 2019-10-16 DIAGNOSIS — G894 Chronic pain syndrome: Secondary | ICD-10-CM | POA: Diagnosis not present

## 2019-10-21 LAB — TOXASSURE SELECT 13 (MW), URINE

## 2019-10-29 ENCOUNTER — Telehealth: Payer: Self-pay

## 2019-10-29 NOTE — Telephone Encounter (Signed)
The [employer] from [employer] ( workers [employer] ) called and has some questions regarding Mr. Villeda medication. Please call. Her name is Rashaun and I probably spelled it wrong.  [phone]

## 2019-10-29 NOTE — Telephone Encounter (Signed)
Call Rashaun back and she needed to know if his prescriptions filled. I told her that we can only see when his prescriptions were sent. Pharmacy will be able to tell her when they were dispensed. Caller with understanding.

## 2019-11-08 NOTE — Telephone Encounter (Signed)
Arrange for face-to-face during patient appointment, if still interested and required.

## 2019-11-13 ENCOUNTER — Encounter: Payer: BC Managed Care – PPO | Admitting: Pain Medicine

## 2019-11-13 ENCOUNTER — Encounter: Payer: Self-pay | Admitting: Student in an Organized Health Care Education/Training Program

## 2019-11-13 ENCOUNTER — Other Ambulatory Visit: Payer: Self-pay

## 2019-11-13 ENCOUNTER — Ambulatory Visit
Payer: No Typology Code available for payment source | Attending: Pain Medicine | Admitting: Student in an Organized Health Care Education/Training Program

## 2019-11-13 DIAGNOSIS — G8929 Other chronic pain: Secondary | ICD-10-CM | POA: Insufficient documentation

## 2019-11-13 DIAGNOSIS — M47816 Spondylosis without myelopathy or radiculopathy, lumbar region: Secondary | ICD-10-CM | POA: Insufficient documentation

## 2019-11-13 DIAGNOSIS — M7918 Myalgia, other site: Secondary | ICD-10-CM | POA: Diagnosis not present

## 2019-11-13 DIAGNOSIS — R252 Cramp and spasm: Secondary | ICD-10-CM | POA: Insufficient documentation

## 2019-11-13 DIAGNOSIS — G894 Chronic pain syndrome: Secondary | ICD-10-CM | POA: Insufficient documentation

## 2019-11-13 MED ORDER — GABAPENTIN 600 MG PO TABS: 1200.0000 mg | ORAL_TABLET | Freq: Every day | ORAL | 2 refills | Status: DC

## 2019-11-13 MED ORDER — OXYCODONE HCL 5 MG PO TABS: 5.0000 mg | ORAL_TABLET | Freq: Four times a day (QID) | ORAL | 0 refills | Status: DC | PRN

## 2019-11-13 MED ORDER — CELECOXIB 200 MG PO CAPS: 200.0000 mg | ORAL_CAPSULE | Freq: Every day | ORAL | 1 refills | Status: DC | PRN

## 2019-11-13 MED ORDER — OXYCODONE HCL ER 10 MG PO T12A: 10.0000 mg | EXTENDED_RELEASE_TABLET | Freq: Two times a day (BID) | ORAL | 0 refills | Status: DC

## 2019-11-13 MED ORDER — BACLOFEN 10 MG PO TABS: 10.0000 mg | ORAL_TABLET | Freq: Three times a day (TID) | ORAL | 2 refills | Status: DC | PRN

## 2019-11-13 NOTE — Progress Notes (Signed)
Nursing Pain Medication Assessment:  Safety precautions to be maintained throughout the outpatient stay will include: orient to surroundings, keep bed in low position, maintain call bell within reach at all times, provide assistance with transfer out of bed and ambulation.  Medication Inspection Compliance: Pill count conducted under aseptic conditions, in front of the patient. Neither the pills nor the bottle was removed from the patient's sight at any time. Once count was completed pills were immediately returned to the patient in their original bottle.  Medication #1: Oxycodone ER (OxyContin) Pill/Patch Count: 1 of 60 pills remain Pill/Patch Appearance: Markings consistent with prescribed medication Bottle Appearance: Standard pharmacy container. Clearly labeled. Filled Date: 07 / 20 / 2021 Last Medication intake:  Today  Medication #2: Oxycodone IR Pill/Patch Count: 2 of 120 pills remain Pill/Patch Appearance: Markings consistent with prescribed medication Bottle Appearance: Standard pharmacy container. Clearly labeled. Filled Date: 07 / 20 / 2021 Last Medication intake:  Today

## 2019-11-13 NOTE — Progress Notes (Signed)
PROVIDER NOTE: Information contained herein reflects review and annotations entered in association with encounter. Interpretation of such information and data should be left to medically-trained personnel. Information provided to patient can be located elsewhere in the medical record under "Patient Instructions". Document created using STT-dictation technology, any transcriptional errors that may result from process are unintentional.    Patient: Lance Roman  Service Category: E/M  Provider: [name], MD  DOB: 1961-02-11  DOS: 11/13/2019  Specialty: Interventional Pain Management  MRN: [id number]  Setting: Ambulatory outpatient  PCP: Hoyt Koch, MD  Type: Established Patient    Referring Provider: Hoyt Koch, *  Location: Office  Delivery: Face-to-face     HPI  Reason for encounter: [name], a 58 y.o. year old male, is here today for evaluation and management of his No primary diagnosis found.. [name] primary complain today is Back Pain (mid and lower) and Leg Pain (left) Last encounter: Practice (10/29/2019). My last encounter with him was on Visit date not found. Pertinent problems: [name] has Chronic low back pain (Primary Area of Pain) (Bilateral) (L>R); Pharmacologic therapy; Long term prescription opiate use; Chronic pain syndrome; Long-term current use of opiate analgesic; Chronic lumbar radicular pain (L5) (Left); Retrolisthesis (4-5 mm) (L3 over L4) (L2 over L3; Failed back surgical syndrome (Fusion L4-S1); Lumbar facet hypertrophy (Bilateral); Lumbar facet syndrome (Bilateral) (L>R); Spinal cord stimulator dysfunction (fractured leads); Lumbar facet osteoarthritis (Bilateral); Osteoarthritis of lumbar spine; Spondylosis without myelopathy or radiculopathy, lumbosacral region; Chronic hip pain (Left); Chronic knee pain (Left); Neurogenic pain; Chronic musculoskeletal pain; and Epidural fibrosis on their pertinent problem list. Pain Assessment:  Severity of   is reported as a 6 /10. Location: Back Lower, Mid/leg. Onset:  . Quality: Throbbing, Burning, Constant. Timing: Constant. Modifying factor(s):  Marland Kitchen Vitals:  height is 5' 10"  (1.778 m) and weight is 230 lb (104.3 kg). His temporal temperature is 98.1 F (36.7 C). His blood pressure is 152/86 (abnormal) and his pulse is 62. His respiration is 18 and oxygen saturation is 100%.   Patient's last visit with Dr. Dossie Arbour on 10/14/2019.  Due to an adequate analgesic response, his oxycodone 10 mg 4 times daily as needed was weaned to 5 mg 4 times daily as needed and OxyContin 10 mg twice daily as a long-acting analgesic was added.  Patient states that although he still has low back and bilateral leg pain, left greater than right, he does find better benefit with a combination of short and long-acting medications.  He would like to continue on his current regimen.  He also utilizes gabapentin 1200 mg nightly.  We will change his dose to 600 mg tablet so that he can take 2 tablets at night to minimize his pill burden.  I will refill baclofen and Celebrex as well.  Pharmacotherapy Assessment   Analgesic:  10/15/2019  1   10/14/2019  Oxycodone Hcl Er 10 MG Tablet  60.00  30 Fr Nav   [id number]   Pie ([id number])   0/0  30.00 MME  Private Pay   [place]  10/15/2019  1   10/14/2019  Oxycodone Hcl 5 MG Tablet  120.00  30 Fr Nav   [id number]   Pie ([id number])   0/0  30.00 MME  Private Pay   [place]       Monitoring: [place] PMP: PDMP reviewed during this encounter.       Pharmacotherapy: No side-effects or adverse reactions reported. Compliance: No problems identified. Effectiveness: Clinically acceptable.  [name], RN  11/13/2019  2:02 PM  Sign when Signing Visit Nursing Pain Medication Assessment:  Safety precautions to be maintained throughout the outpatient stay will include: orient to surroundings, keep bed in low position, maintain call bell within reach at all times, provide assistance with transfer out of bed and  ambulation.  Medication Inspection Compliance: Pill count conducted under aseptic conditions, in front of the patient. Neither the pills nor the bottle was removed from the patient's sight at any time. Once count was completed pills were immediately returned to the patient in their original bottle.  Medication #1: Oxycodone ER (OxyContin) Pill/Patch Count: 1 of 60 pills remain Pill/Patch Appearance: Markings consistent with prescribed medication Bottle Appearance: Standard pharmacy container. Clearly labeled. Filled Date: 07 / 20 / 2021 Last Medication intake:  Today  Medication #2: Oxycodone IR Pill/Patch Count: 2 of 120 pills remain Pill/Patch Appearance: Markings consistent with prescribed medication Bottle Appearance: Standard pharmacy container. Clearly labeled. Filled Date: 07 / 20 / 2021 Last Medication intake:  Today    UDS:  Summary  Date Value Ref Range Status  10/16/2019 Note  Final    Comment:    ==================================================================== [employer] [facility] 13 (MW) ==================================================================== Test                             Result       Flag       Units  Drug Present and Declared for Prescription Verification   Oxycodone                      3280         EXPECTED   ng/mg creat   Oxymorphone                    851          EXPECTED   ng/mg creat   Noroxycodone                   7456         EXPECTED   ng/mg creat   Noroxymorphone                 237          EXPECTED   ng/mg creat    Sources of oxycodone are scheduled prescription medications.    Oxymorphone, noroxycodone, and noroxymorphone are expected    metabolites of oxycodone. Oxymorphone is also available as a    scheduled prescription medication.  ==================================================================== Test                      Result    Flag   Units      Ref Range   Creatinine              127              mg/dL       >=20 ==================================================================== Declared Medications:  The flagging and interpretation on this report are based on the  following declared medications.  Unexpected results may arise from  inaccuracies in the declared medications.   **Note: The testing scope of this panel includes these medications:   Oxycodone   **Note: The testing scope of this panel does not include the  following reported medications:   Baclofen (Lioresal)  Celecoxib (Celebrex)  Esomeprazole (Nexium)  Gabapentin (Neurontin)  Ibuprofen (Advil) ==================================================================== For clinical  consultation, please call [phone]. ====================================================================      ROS  Constitutional: Denies any fever or chills Gastrointestinal: No reported hemesis, hematochezia, vomiting, or acute GI distress Musculoskeletal: Bilateral low back pain, leg pain Neurological: No reported episodes of acute onset apraxia, aphasia, dysarthria, agnosia, amnesia, paralysis, loss of coordination, or loss of consciousness  Medication Review  Oxycodone HCl, baclofen, celecoxib, esomeprazole, gabapentin, ibuprofen, and oxyCODONE  History Review  Allergy: Mr. Boyajian has No Known Allergies. Drug: Mr. Holst  reports no history of drug use. Alcohol:  reports current alcohol use of about 3.0 standard drinks of alcohol per week. Tobacco:  reports that he has quit smoking. His smokeless tobacco use includes chew. Social: Mr. Burks  reports that he has quit smoking. His smokeless tobacco use includes chew. He reports current alcohol use of about 3.0 standard drinks of alcohol per week. He reports that he does not use drugs. Medical:  has a past medical history of Acute postoperative pain (11/21/2017), Allergic rhinitis, Chronic pain syndrome, DDD (degenerative disc disease), lumbosacral, GERD (gastroesophageal reflux disease),  Hyperlipidemia, Osteoarthritis, and S/P appendectomy. Surgical: Mr. Klemann  has a past surgical history that includes Appendectomy; repairs of bilateral ankle fractures; Spinal fusion (N/A); and Spinal cord stimulator implant (N/A). Family: family history includes Diabetes in his maternal grandmother; Heart disease in his father.  Laboratory Chemistry Profile   Renal Lab Results  Component Value Date   BUN 20 12/12/2016   CREATININE 0.74 (L) 12/12/2016   BCR 27 (H) 12/12/2016   GFR 96.24 12/30/2014   GFRAA 120 12/12/2016   GFRNONAA 104 12/12/2016     Hepatic Lab Results  Component Value Date   AST 21 12/12/2016   ALT 12 12/30/2014   ALBUMIN 4.5 12/12/2016   ALKPHOS 66 12/12/2016   HCVAB NEGATIVE 12/30/2014     Electrolytes Lab Results  Component Value Date   NA 140 12/12/2016   K 4.5 12/12/2016   CL 99 12/12/2016   CALCIUM 9.7 12/12/2016   MG 1.9 12/12/2016     Bone Lab Results  Component Value Date   25OHVITD1 44 12/12/2016   25OHVITD2 <1.0 12/12/2016   25OHVITD3 43 12/12/2016     Inflammation (CRP: Acute Phase) (ESR: Chronic Phase) Lab Results  Component Value Date   CRP 2.5 12/12/2016   ESRSEDRATE 10 12/12/2016       Note: Above Lab results reviewed.   Physical Exam  General appearance: Well nourished, well developed, and well hydrated. In no apparent acute distress Mental status: Alert, oriented x 3 (person, place, & time)       Respiratory: No evidence of acute respiratory distress Eyes: PERLA Vitals: BP (!) 152/86   Pulse 62   Temp 98.1 F (36.7 C) (Temporal)   Resp 18   Ht 5' 10"  (1.778 m)   Wt 230 lb (104.3 kg)   SpO2 100%   BMI 33.00 kg/m  BMI: Estimated body mass index is 33 kg/m as calculated from the following:   Height as of this encounter: 5' 10"  (1.778 m).   Weight as of this encounter: 230 lb (104.3 kg). Ideal: Ideal body weight: 73 kg (160 lb 15 oz) Adjusted ideal body weight: 85.5 kg (188 lb 9 oz)  Lumbar Spine Area Exam   Skin & Axial Inspection: Well healed scar from previous spine surgery detected Alignment: Symmetrical Functional ROM: Pain restricted ROM       Stability: No instability detected Muscle Tone/Strength: Functionally intact. No obvious neuro-muscular anomalies detected. Sensory (Neurological):  Dermatomal pain pattern and musculoskeletal  Gait & Posture Assessment  Ambulation: Unassisted Gait: Relatively normal for age and body habitus Posture: Difficulty standing up straight, due to pain  Lower Extremity Exam    Side: Right lower extremity  Side: Left lower extremity  Stability: No instability observed          Stability: No instability observed          Skin & Extremity Inspection: Skin color, temperature, and hair growth are WNL. No peripheral edema or cyanosis. No masses, redness, swelling, asymmetry, or associated skin lesions. No contractures.  Skin & Extremity Inspection: Skin color, temperature, and hair growth are WNL. No peripheral edema or cyanosis. No masses, redness, swelling, asymmetry, or associated skin lesions. No contractures.  Functional ROM: Pain restricted ROM for hip joint          Functional ROM: Pain restricted ROM for hip joint          Muscle Tone/Strength: Functionally intact. No obvious neuro-muscular anomalies detected.  Muscle Tone/Strength: Functionally intact. No obvious neuro-muscular anomalies detected.  Sensory (Neurological): Unimpaired        Sensory (Neurological): Unimpaired        DTR: Patellar: deferred today Achilles: deferred today Plantar: deferred today  DTR: Patellar: deferred today Achilles: deferred today Plantar: deferred today  Palpation: No palpable anomalies  Palpation: No palpable anomalies    Assessment   Status Diagnosis  Controlled Controlled Controlled 1. Chronic pain syndrome   2. Chronic musculoskeletal pain   3. Muscle cramps   4. Osteoarthritis of lumbar spine      Updated Problems: Problem  Epidural Fibrosis    Diagnosed on epidurogram performed on 05/14/2019 and observed to be affecting the left L5 nerve root.   Chronic Musculoskeletal Pain  Neurogenic Pain  Spondylosis Without Myelopathy Or Radiculopathy, Lumbosacral Region  Chronic hip pain (Left)  Chronic knee pain (Left)  Retrolisthesis (4-5 mm) (L3 over L4) (L2 over L3  Failed back surgical syndrome (Fusion L4-S1)  Lumbar facet hypertrophy (Bilateral)  Lumbar facet syndrome (Bilateral) (L>R)  Spinal cord stimulator dysfunction (fractured leads)   Posterior approach spinal stimulator device with fractured leads (most notably the right hand side lead near situated between the T12 and L1 spinous processes). The intraspinal component is centered at the T12 level. Not working since 2004. Surgery done twice (Oct 2001, due to broken leads, about 6 months after initial placement.) (Implant by Orland Penman, MD - both surgeries)   Lumbar facet osteoarthritis (Bilateral)  Osteoarthritis of lumbar spine  Long Term Prescription Opiate Use  Chronic Pain Syndrome  Long-Term Current Use of Opiate Analgesic  Chronic lumbar radicular pain (L5) (Left)  Pharmacologic Therapy  Chronic low back pain (Primary Area of Pain) (Bilateral) (L>R)    Plan of Care  Mr. Hillis Mcphatter [place] has a current medication list which includes the following long-term medication(s): baclofen, celecoxib, esomeprazole, [START ON 11/14/2019] oxycodone, [START ON 12/14/2019] oxycodone, [START ON 01/13/2020] oxycodone, [START ON 11/14/2019] oxycodone, [START ON 12/14/2019] oxycodone, [START ON 01/13/2020] oxycodone, gabapentin, and oxycodone hcl.  Pharmacotherapy (Medications Ordered): Meds ordered this encounter  Medications  . oxyCODONE (OXYCONTIN) 10 mg 12 hr tablet    Sig: Take 1 tablet (10 mg total) by mouth every 12 (twelve) hours. Must last 30 days.    Dispense:  60 tablet    Refill:  0    Chronic Pain: STOP Act (Not applicable) Fill 1 day early if closed on refill date.  Marland Kitchen  oxyCODONE (OXY IR/ROXICODONE)  5 MG immediate release tablet    Sig: Take 1 tablet (5 mg total) by mouth every 6 (six) hours as needed for severe pain. Must last 30 days.    Dispense:  120 tablet    Refill:  0    Chronic Pain: STOP Act (Not applicable) Fill 1 day early if closed on refill date.  . gabapentin (NEURONTIN) 600 MG tablet    Sig: Take 2 tablets (1,200 mg total) by mouth at bedtime.    Dispense:  60 tablet    Refill:  2  . oxyCODONE (OXYCONTIN) 10 mg 12 hr tablet    Sig: Take 1 tablet (10 mg total) by mouth every 12 (twelve) hours. Must last 30 days.    Dispense:  60 tablet    Refill:  0    Chronic Pain: STOP Act (Not applicable) Fill 1 day early if closed on refill date.  Marland Kitchen oxyCODONE (OXYCONTIN) 10 mg 12 hr tablet    Sig: Take 1 tablet (10 mg total) by mouth every 12 (twelve) hours. Must last 30 days.    Dispense:  60 tablet    Refill:  0    Chronic Pain: STOP Act (Not applicable) Fill 1 day early if closed on refill date.  Marland Kitchen oxyCODONE (OXY IR/ROXICODONE) 5 MG immediate release tablet    Sig: Take 1 tablet (5 mg total) by mouth every 6 (six) hours as needed for severe pain. Must last 30 days.    Dispense:  120 tablet    Refill:  0    Chronic Pain: STOP Act (Not applicable) Fill 1 day early if closed on refill date.  Marland Kitchen oxyCODONE (OXY IR/ROXICODONE) 5 MG immediate release tablet    Sig: Take 1 tablet (5 mg total) by mouth every 6 (six) hours as needed for severe pain. Must last 30 days.    Dispense:  120 tablet    Refill:  0    Chronic Pain: STOP Act (Not applicable) Fill 1 day early if closed on refill date.  . baclofen (LIORESAL) 10 MG tablet    Sig: Take 1 tablet (10 mg total) by mouth 3 (three) times daily as needed for muscle spasms.    Dispense:  90 tablet    Refill:  2    Fill one day early if pharmacy is closed on scheduled refill date. May substitute for generic if available.  . celecoxib (CELEBREX) 200 MG capsule    Sig: Take 1 capsule (200 mg total) by mouth  daily as needed.    Dispense:  60 capsule    Refill:  1    Do not place medication on "Automatic Refill". Fill one day early if pharmacy is closed on scheduled refill date.   Follow-up plan:   Return in about 3 months (around 02/13/2020) for Medication Management, in person.   Recent Visits Date Type Provider Dept  10/14/19 Telemedicine Milinda Pointer, MD [facility]  Showing recent visits within past 90 days and meeting all other requirements Today's Visits Date Type Provider Dept  11/13/19 Office Visit [name], MD [facility]  Showing today's visits and meeting all other requirements Future Appointments Date Type Provider Dept  02/05/20 Appointment Milinda Pointer, MD [facility]  Showing future appointments within next 90 days and meeting all other requirements  I discussed the assessment and treatment plan with the patient. The patient was provided an opportunity to ask questions and all were answered. The patient agreed with the plan and demonstrated  an understanding of the instructions.  Patient advised to call back or seek an in-person evaluation if the symptoms or condition worsens.  Duration of encounter: 101mnutes.  Note by: BGillis Santa MD Date: 11/13/2019; Time: 2:42 PM

## 2019-12-10 DIAGNOSIS — H401131 Primary open-angle glaucoma, bilateral, mild stage: Secondary | ICD-10-CM | POA: Diagnosis not present

## 2020-01-16 DIAGNOSIS — H401131 Primary open-angle glaucoma, bilateral, mild stage: Secondary | ICD-10-CM | POA: Diagnosis not present

## 2020-02-05 ENCOUNTER — Encounter: Payer: Self-pay | Admitting: Pain Medicine

## 2020-02-05 ENCOUNTER — Other Ambulatory Visit: Payer: Self-pay

## 2020-02-05 ENCOUNTER — Ambulatory Visit: Payer: No Typology Code available for payment source | Attending: Pain Medicine | Admitting: Pain Medicine

## 2020-02-05 VITALS — BP 140/73 | HR 62 | Temp 97.2°F | Ht 70.0 in | Wt 235.0 lb

## 2020-02-05 DIAGNOSIS — F1120 Opioid dependence, uncomplicated: Secondary | ICD-10-CM | POA: Diagnosis present

## 2020-02-05 DIAGNOSIS — R252 Cramp and spasm: Secondary | ICD-10-CM | POA: Diagnosis present

## 2020-02-05 DIAGNOSIS — G894 Chronic pain syndrome: Secondary | ICD-10-CM

## 2020-02-05 DIAGNOSIS — M7918 Myalgia, other site: Secondary | ICD-10-CM

## 2020-02-05 DIAGNOSIS — M961 Postlaminectomy syndrome, not elsewhere classified: Secondary | ICD-10-CM

## 2020-02-05 DIAGNOSIS — K219 Gastro-esophageal reflux disease without esophagitis: Secondary | ICD-10-CM | POA: Diagnosis present

## 2020-02-05 DIAGNOSIS — F1190 Opioid use, unspecified, uncomplicated: Secondary | ICD-10-CM

## 2020-02-05 DIAGNOSIS — M5442 Lumbago with sciatica, left side: Secondary | ICD-10-CM

## 2020-02-05 DIAGNOSIS — M79605 Pain in left leg: Secondary | ICD-10-CM

## 2020-02-05 DIAGNOSIS — G8929 Other chronic pain: Secondary | ICD-10-CM

## 2020-02-05 DIAGNOSIS — M792 Neuralgia and neuritis, unspecified: Secondary | ICD-10-CM

## 2020-02-05 DIAGNOSIS — T85192S Other mechanical complication of implanted electronic neurostimulator (electrode) of spinal cord, sequela: Secondary | ICD-10-CM

## 2020-02-05 DIAGNOSIS — M47816 Spondylosis without myelopathy or radiculopathy, lumbar region: Secondary | ICD-10-CM | POA: Diagnosis present

## 2020-02-05 DIAGNOSIS — Z79899 Other long term (current) drug therapy: Secondary | ICD-10-CM | POA: Diagnosis present

## 2020-02-05 MED ORDER — BACLOFEN 10 MG PO TABS: 10.0000 mg | ORAL_TABLET | Freq: Three times a day (TID) | ORAL | 2 refills | Status: DC | PRN

## 2020-02-05 MED ORDER — OXYCODONE HCL 5 MG PO TABS: 5.0000 mg | ORAL_TABLET | Freq: Four times a day (QID) | ORAL | 0 refills | Status: DC | PRN

## 2020-02-05 MED ORDER — CELECOXIB 200 MG PO CAPS: 200.0000 mg | ORAL_CAPSULE | Freq: Every day | ORAL | 2 refills | Status: DC

## 2020-02-05 MED ORDER — NALOXONE HCL 2 MG/2ML IJ SOSY: 1.0000 mg | PREFILLED_SYRINGE | INTRAMUSCULAR | 0 refills | Status: DC | PRN

## 2020-02-05 MED ORDER — OXYCODONE HCL ER 10 MG PO T12A: 10.0000 mg | EXTENDED_RELEASE_TABLET | Freq: Two times a day (BID) | ORAL | 0 refills | Status: DC

## 2020-02-05 MED ORDER — GABAPENTIN 600 MG PO TABS: 1200.0000 mg | ORAL_TABLET | Freq: Every day | ORAL | 2 refills | Status: DC

## 2020-02-05 MED ORDER — ESOMEPRAZOLE MAGNESIUM 40 MG PO CPDR: 40.0000 mg | DELAYED_RELEASE_CAPSULE | Freq: Every day | ORAL | 2 refills | Status: AC

## 2020-02-05 NOTE — Patient Instructions (Addendum)
____________________________________________________________________________________________  CBD (cannabidiol) WARNING  Applicable to: All individuals currently taking or considering taking CBD (cannabidiol) and, more important, all patients taking opioid analgesic controlled substances (pain medication). (Example: oxycodone; oxymorphone; hydrocodone; hydromorphone; morphine; methadone; tramadol; tapentadol; fentanyl; buprenorphine; butorphanol; dextromethorphan; meperidine; codeine; etc.)  Legal status: CBD remains a Schedule I drug prohibited for any use. CBD is illegal with one exception. In the [place], CBD has a limited Food and Drug Administration (FDA) approval for the treatment of two specific types of epilepsy disorders. Only one CBD product has been approved by the FDA for this purpose: "Epidiolex". FDA is aware that some companies are marketing products containing cannabis and cannabis-derived compounds in ways that violate the Federal Food, Drug and Cosmetic Act (FD&C Act) and that may put the health and safety of consumers at risk. The FDA, a Federal agency, has not enforced the CBD status since 2018.   Legality: Some manufacturers ship CBD products nationally, which is illegal. Often such products are sold online and are therefore available throughout the country. CBD is openly sold in head shops and health food stores in some states where such sales have not been explicitly legalized. Selling unapproved products with unsubstantiated therapeutic claims is not only a violation of the law, but also can put patients at risk, as these products have not been proven to be safe or effective. Federal illegality makes it difficult to conduct research on CBD.  Reference: "FDA Regulation of Cannabis and Cannabis-Derived Products, Including Cannabidiol (CBD)" -  https://www.fda.gov/news-events/public-health-focus/fda-regulation-cannabis-and-cannabis-derived-products-including-cannabidiol-cbd  Warning: CBD is not FDA approved and has not undergo the same manufacturing controls as prescription drugs.  This means that the purity and safety of available CBD may be questionable. Most of the time, despite manufacturer's claims, it is contaminated with THC (delta-9-tetrahydrocannabinol - the chemical in marijuana responsible for the "HIGH").  When this is the case, the THC contaminant will trigger a positive urine drug screen (UDS) test for Marijuana (carboxy-THC). Because a positive UDS for any illicit substance is a violation of our medication agreement, your opioid analgesics (pain medicine) may be permanently discontinued.  MORE ABOUT CBD  General Information: CBD  is a derivative of the Marijuana (cannabis sativa) plant discovered in 1940. It is one of the 113 identified substances found in Marijuana. It accounts for up to 40% of the plant's extract. As of 2018, preliminary clinical studies on CBD included research for the treatment of anxiety, movement disorders, and pain. CBD is available and consumed in multiple forms, including inhalation of smoke or vapor, as an aerosol spray, and by mouth. It may be supplied as an oil containing CBD, capsules, dried cannabis, or as a liquid solution. CBD is thought not to be as psychoactive as THC (delta-9-tetrahydrocannabinol - the chemical in marijuana responsible for the "HIGH"). Studies suggest that CBD may interact with different biological target receptors in the body, including cannabinoid and other neurotransmitter receptors. As of 2018 the mechanism of action for its biological effects has not been determined.  Side-effects  Adverse reactions: Dry mouth, diarrhea, decreased appetite, fatigue, drowsiness, malaise, weakness, sleep disturbances, and others.  Drug interactions: CBC may interact with other medications  such as blood-thinners. (Last update: 11/02/2019) ____________________________________________________________________________________________   ____________________________________________________________________________________________  Medication Rules  Purpose: To inform patients, and their family members, of our rules and regulations.  Applies to: All patients receiving prescriptions (written or electronic).  Pharmacy of record: Pharmacy where electronic prescriptions will be sent. If written prescriptions are taken to a different pharmacy, please inform   the nursing staff. The pharmacy listed in the electronic medical record should be the one where you would like electronic prescriptions to be sent.  Electronic prescriptions: In compliance with the [place] Strengthen Opioid Misuse Prevention (STOP) Act of 2017 (Session Law 2017-74/H243), effective March 28, 2018, all controlled substances must be electronically prescribed. Calling prescriptions to the pharmacy will cease to exist.  Prescription refills: Only during scheduled appointments. Applies to all prescriptions.  NOTE: The following applies primarily to controlled substances (Opioid* Pain Medications).   Type of encounter (visit): For patients receiving controlled substances, face-to-face visits are required. (Not an option or up to the patient.)  Patient's responsibilities: 1. Pain Pills: Bring all pain pills to every appointment (except for procedure appointments). 2. Pill Bottles: Bring pills in original pharmacy bottle. Always bring the newest bottle. Bring bottle, even if empty. 3. Medication refills: You are responsible for knowing and keeping track of what medications you take and those you need refilled. The day before your appointment: write a list of all prescriptions that need to be refilled. The day of the appointment: give the list to the admitting nurse. Prescriptions will be written only during  appointments. No prescriptions will be written on procedure days. If you forget a medication: it will not be "Called in", "Faxed", or "electronically sent". You will need to get another appointment to get these prescribed. No early refills. Do not call asking to have your prescription filled early. 4. Prescription Accuracy: You are responsible for carefully inspecting your prescriptions before leaving our office. Have the discharge nurse carefully go over each prescription with you, before taking them home. Make sure that your name is accurately spelled, that your address is correct. Check the name and dose of your medication to make sure it is accurate. Check the number of pills, and the written instructions to make sure they are clear and accurate. Make sure that you are given enough medication to last until your next medication refill appointment. 5. Taking Medication: Take medication as prescribed. When it comes to controlled substances, taking less pills or less frequently than prescribed is permitted and encouraged. Never take more pills than instructed. Never take medication more frequently than prescribed.  6. Inform other Doctors: Always inform, all of your healthcare providers, of all the medications you take. 7. Pain Medication from other Providers: You are not allowed to accept any additional pain medication from any other Doctor or Healthcare provider. There are two exceptions to this rule. (see below) In the event that you require additional pain medication, you are responsible for notifying us, as stated below. 8. Medication Agreement: You are responsible for carefully reading and following our Medication Agreement. This must be signed before receiving any prescriptions from our practice. Safely store a copy of your signed Agreement. Violations to the Agreement will result in no further prescriptions. (Additional copies of our Medication Agreement are available upon request.) 9. Laws, Rules,  & Regulations: All patients are expected to follow all Federal and State Laws, Statutes, Rules, & Regulations. Ignorance of the Laws does not constitute a valid excuse.  10. Illegal drugs and Controlled Substances: The use of illegal substances (including, but not limited to marijuana and its derivatives) and/or the illegal use of any controlled substances is strictly prohibited. Violation of this rule may result in the immediate and permanent discontinuation of any and all prescriptions being written by our practice. The use of any illegal substances is prohibited. 11. Adopted CDC guidelines & recommendations: Target dosing   levels will be at or below 60 MME/day. Use of benzodiazepines** is not recommended.  Exceptions: There are only two exceptions to the rule of not receiving pain medications from other Healthcare Providers. 1. Exception #1 (Emergencies): In the event of an emergency (i.e.: accident requiring emergency care), you are allowed to receive additional pain medication. However, you are responsible for: As soon as you are able, call our office [phone], at any time of the day or night, and leave a message stating your name, the date and nature of the emergency, and the name and dose of the medication prescribed. In the event that your call is answered by a member of our staff, make sure to document and save the date, time, and the name of the person that took your information.  2. Exception #2 (Planned Surgery): In the event that you are scheduled by another doctor or dentist to have any type of surgery or procedure, you are allowed (for a period no longer than 30 days), to receive additional pain medication, for the acute post-op pain. However, in this case, you are responsible for picking up a copy of our "Post-op Pain Management for Surgeons" handout, and giving it to your surgeon or dentist. This document is available at our office, and does not require an appointment to obtain it. Simply  go to our office during business hours (Monday-Thursday from 8:00 AM to 4:00 PM) (Friday 8:00 AM to 12:00 Noon) or if you have a scheduled appointment with us, prior to your surgery, and ask for it by name. In addition, you are responsible for: calling our office [phone], at any time of the day or night, and leaving a message stating your name, name of your surgeon, type of surgery, and date of procedure or surgery. Failure to comply with your responsibilities may result in termination of therapy involving the controlled substances.  *Opioid medications include: morphine, codeine, oxycodone, oxymorphone, hydrocodone, hydromorphone, meperidine, tramadol, tapentadol, buprenorphine, fentanyl, methadone. **Benzodiazepine medications include: diazepam (Valium), alprazolam (Xanax), clonazepam (Klonopine), lorazepam (Ativan), clorazepate (Tranxene), chlordiazepoxide (Librium), estazolam (Prosom), oxazepam (Serax), temazepam (Restoril), triazolam (Halcion) (Last updated: 12/03/2019) ____________________________________________________________________________________________   ____________________________________________________________________________________________  Medication Recommendations and Reminders  Applies to: All patients receiving prescriptions (written and/or electronic).  Medication Rules & Regulations: These rules and regulations exist for your safety and that of others. They are not flexible and neither are we. Dismissing or ignoring them will be considered "non-compliance" with medication therapy, resulting in complete and irreversible termination of such therapy. (See document titled "Medication Rules" for more details.) In all conscience, because of safety reasons, we cannot continue providing a therapy where the patient does not follow instructions.  Pharmacy of record:   Definition: This is the pharmacy where your electronic prescriptions will be sent.   We do not endorse any  particular pharmacy, however, we have experienced problems with [facility] not securing enough medication supply for the community.  We do not restrict you in your choice of pharmacy. However, once we write for your prescriptions, we will NOT be re-sending more prescriptions to fix restricted supply problems created by your pharmacy, or your insurance.   The pharmacy listed in the electronic medical record should be the one where you want electronic prescriptions to be sent.  If you choose to change pharmacy, simply notify our nursing staff.  Recommendations:  Keep all of your pain medications in a safe place, under lock and key, even if you live alone. We will NOT replace lost, stolen, or   damaged medication.  After you fill your prescription, take 1 week's worth of pills and put them away in a safe place. You should keep a separate, properly labeled bottle for this purpose. The remainder should be kept in the original bottle. Use this as your primary supply, until it runs out. Once it's gone, then you know that you have 1 week's worth of medicine, and it is time to come in for a prescription refill. If you do this correctly, it is unlikely that you will ever run out of medicine.  To make sure that the above recommendation works, it is very important that you make sure your medication refill appointments are scheduled at least 1 week before you run out of medicine. To do this in an effective manner, make sure that you do not leave the office without scheduling your next medication management appointment. Always ask the nursing staff to show you in your prescription , when your medication will be running out. Then arrange for the receptionist to get you a return appointment, at least 7 days before you run out of medicine. Do not wait until you have 1 or 2 pills left, to come in. This is very poor planning and does not take into consideration that we may need to cancel appointments due to bad weather,  sickness, or emergencies affecting our staff.  DO NOT ACCEPT A "Partial Fill": If for any reason your pharmacy does not have enough pills/tablets to completely fill or refill your prescription, do not allow for a "partial fill". The law allows the pharmacy to complete that prescription within 72 hours, without requiring a new prescription. If they do not fill the rest of your prescription within those 72 hours, you will need a separate prescription to fill the remaining amount, which we will NOT provide. If the reason for the partial fill is your insurance, you will need to talk to the pharmacist about payment alternatives for the remaining tablets, but again, DO NOT ACCEPT A PARTIAL FILL, unless you can trust your pharmacist to obtain the remainder of the pills within 72 hours.  Prescription refills and/or changes in medication(s):   Prescription refills, and/or changes in dose or medication, will be conducted only during scheduled medication management appointments. (Applies to both, written and electronic prescriptions.)  No refills on procedure days. No medication will be changed or started on procedure days. No changes, adjustments, and/or refills will be conducted on a procedure day. Doing so will interfere with the diagnostic portion of the procedure.  No phone refills. No medications will be "called into the pharmacy".  No Fax refills.  No weekend refills.  No Holliday refills.  No after hours refills.  Remember:  Business hours are:  Monday to Thursday 8:00 AM to 4:00 PM Provider's Schedule: Delano Metz, MD - Appointments are:  Medication management: Monday and Wednesday 8:00 AM to 4:00 PM Procedure day: Tuesday and Thursday 7:30 AM to 4:00 PM Edward Jolly, MD - Appointments are:  Medication management: Tuesday and Thursday 8:00 AM to 4:00 PM Procedure day: Monday and Wednesday 7:30 AM to 4:00 PM (Last update:  10/16/2019) ____________________________________________________________________________________________    Intrathecal Pain Pump Information An intrathecal pain pump is a pump that sends medicine into your spinal canal through a thin tube (catheter). You may need an intrathecal pain pump if you have long-term pain that cannot be managed in other ways. In some cases, this pump can be used to deliver medicine that treats long-term muscle spasms (spasticity).  If you are using the pump for only a short time, the pump may be outside your body.  For long-term use, the pump and the catheter are implanted inside your body. The pump is a small round disc and is usually placed under the skin of the abdomen. The catheter will run under your skin from your abdomen to your back. Your health care provider will program the pump to deliver pain medicine into your spinal canal continuously or at certain intervals. You may have a handheld device that allows you to give yourself a dose of pain medicine when needed. The pump runs on a battery. The battery will last 7-10 years, and then the pump will need to be replaced. What are the benefits of an intrathecal pain pump? Opioid pain medicines are strong medicines that are often used to control long-term and severe pain. When these medicines are placed in the fluid space (intrathecal space) that surrounds your spinal cord and brain, the medicine will block the pain signals that come from your body. This type of pump may be an option if you have long-term pain that cannot be managed with opioids that are taken by mouth or by injection. The pump may allow you to have better control of your pain with less medicine. What are the risks of an intrathecal pain pump? Before you can have the pump placed, you will need to go through a pain control trial. Medicine will be placed into your intrathecal space to see if it controls your pain and to see how well you tolerate the  medicine. You will need to have a surgical procedure to place the catheter and the pain pump. This surgery has some risks. There are also risks to long-term use of opioids. Risks of pain pump implantation and use include:  Infection.  Bleeding.  Failure of the pump or the catheter.  Leaking of fluid from your spinal canal (cerebrospinal fluid leak).  A growth around the tip of the catheter inside your spinal canal (granuloma). This can put pressure on your spine.  A need to remove the pump if problems develop.  A need to replace the pump after 7-10 years. Risks of long-term opioid use include:  A breathing problem that prevents you from getting enough oxygen (respiratory depression).  Physical and psychological dependence.  Constipation or difficulty passing urine.  Nausea and vomiting.  Mental changes, including feeling high, dizzy, drowsy, confused, depressed, or anxious. How do I use the pump? Your health care provider will decide what type of pump is best for you. If you have a pump that gives a dose of medicine on demand, you may have a handheld device to trigger the release of your medicine. You will need to see your health care provider often to make sure the pump is programmed correctly for your pain. This programming may need to be changed over time. You will also need to have the pump resupplied with medicine. Your health care provider may do this at your routine visits. You should also know that:  Electronic devices or scans will not damage your pump.  You will need a device identification card to pass through security gates.  Your device will have an alarm to warn you of a malfunction or low battery power. The alarm will sound if you need a medicine refill. Contact your health care provider if your alarm sounds. Summary  An intrathecal pain pump is a pump that sends medicine into your spinal canal through a thin  tube (catheter).  The pump is a small round disc  and is usually placed under the skin of the abdomen.  This type of pump may be an option if you have long-term pain that cannot be managed with opioids that are taken by mouth or by injection.  The pump may allow you to have better control of your pain with less medicine. This information is not intended to replace advice given to you by your health care provider. Make sure you discuss any questions you have with your health care provider. Document Revised: 01/15/2018 Document Reviewed: 01/15/2018 Elsevier Patient Education  2020 [employer].

## 2020-02-05 NOTE — Progress Notes (Signed)
Nursing Pain Medication Assessment:  Safety precautions to be maintained throughout the outpatient stay will include: orient to surroundings, keep bed in low position, maintain call bell within reach at all times, provide assistance with transfer out of bed and ambulation.  Medication Inspection Compliance: Pill count conducted under aseptic conditions, in front of the patient. Neither the pills nor the bottle was removed from the patient's sight at any time. Once count was completed pills were immediately returned to the patient in their original bottle.  Medication #1: Oxycodone IR Pill/Patch Count: 26 of 120 pills remain Pill/Patch Appearance: Markings consistent with prescribed medication Bottle Appearance: Standard pharmacy container. Clearly labeled. Filled Date: 10 / 8 / 21 Last Medication intake:  Today  Medication #2: Oxycodone ER (OxyContin) Pill/Patch Count: 13 of 60 pills remain Pill/Patch Appearance: Markings consistent with prescribed medication Bottle Appearance: Standard pharmacy container. Clearly labeled. Filled Date: 10 / 68 / 21 Last Medication intake:  TodaySafety precautions to be maintained throughout the outpatient stay will include: orient to surroundings, keep bed in low position, maintain call bell within reach at all times, provide assistance with transfer out of bed and ambulation.

## 2020-02-05 NOTE — Progress Notes (Signed)
PROVIDER NOTE: Information contained herein reflects review and annotations entered in association with encounter. Interpretation of such information and data should be left to medically-trained personnel. Information provided to patient can be located elsewhere in the medical record under "Patient Instructions". Document created using STT-dictation technology, any transcriptional errors that may result from process are unintentional.    Patient: Lance Roman  Service Category: E/M  Provider: Gaspar Cola, MD  DOB: 1961/01/27  DOS: 02/05/2020  Specialty: Interventional Pain Management  MRN: [id number]  Setting: Ambulatory outpatient  PCP: Hoyt Koch, MD  Type: Established Patient    Referring Provider: Hoyt Koch, *  Location: Office  Delivery: Face-to-face     HPI  [name], a 59 y.o. year old male, is here today because of his Chronic pain syndrome [G89.4]. Mr. Isenberg primary complain today is Back Pain Last encounter: My last encounter with him was on Visit date not found. Pertinent problems: [name] has Chronic low back pain (Primary Area of Pain) (Bilateral) (L>R); Chronic pain syndrome; Chronic lower extremity pain (Secondary Area of Pain) (Left); Chronic lumbar radicular pain (L5) (Left); Retrolisthesis (4-5 mm) (L3 over L4) (L2 over L3; Failed back surgical syndrome (Fusion L4-S1); Lumbar facet hypertrophy (Bilateral); Lumbar facet syndrome (Bilateral) (L>R); Lumbar facet osteoarthritis (Bilateral); Osteoarthritis of lumbar spine; Spondylosis without myelopathy or radiculopathy, lumbosacral region; Other specified dorsopathies, sacral and sacrococcygeal region; Chronic hip pain (Left); Chronic knee pain (Left); DDD (degenerative disc disease), lumbosacral; Neurogenic pain; Trigger point with back pain; Chronic upper back pain; Chronic musculoskeletal pain; and Epidural fibrosis on their pertinent problem list. Pain Assessment: Severity of Chronic pain  is reported as a 8 /10. Location: Back Left/pain radiaties down left leg to his heel. Onset: More than a month ago. Quality: Burning, Aching, Constant. Timing: Constant. Modifying factor(s): meds. Vitals:  height is 5' 10"  (1.778 m) and weight is 235 lb (106.6 kg). His temperature is 97.2 F (36.2 C) (abnormal). His blood pressure is 140/73 and his pulse is 62. His oxygen saturation is 99%.   Reason for encounter: medication management.  The patient indicates doing well with the current medication regimen. No adverse reactions or side effects reported to the medications.  The patient indicates that his worst pain is that of the lower back and now it seems to be traveling upwards.  He describes having pain across his mid thoracic area at the level of the lower age of the scapulas.  He feels this to be a burning sensation.  In reviewing further the possible reasons for this, I have learned that he has been having problems with Worker's Compensation paying for his medications.  In fact, they have denied payment for his last visit and the medications written on his last visit because they came from Dr. [name], my practice partner, who was covering for me during vacation.  To me this is clearly wrong and I have encouraged him to appeal that.  Furthermore, as the practice, we find ourselves in the need to transfer nonopioids to the patient's primary care provider in order to continue providing services that may involve the monitoring and prescribing of opioid analgesics.  As controlled substances, there is a significant amount of regulations that we need to comply with.  Because of this, nonopioids are being transferred on all of our patients to the PCP.  Because of this issues with Worker's Compensation pain for the medications, he ran out of the gabapentin (Neurontin) which is the medication  prescribed for the treatment of the neurogenic/neuropathic component of the pain (burning sensation).  This is very  likely to be the reason why he is now experiencing an expansion in his pain pattern into a thoracic region, which he has described as a "burning sensation".  It is very clear to me that it is medically necessary for this patient to continue on these medications, on interrupted.  Whether the medicines come from me, my partner, or the patient's primary care physician is totally irrelevant.  They are being prescribed for the pain caused by the Worker's Compensation injury.  In a similar manner, the patient needs to continue taking the Celebrex, which is a nonsteroidal anti-inflammatory drug prescribed for the arthropathic component of the injury.  Unfortunately, because it also irritates the gastrointestinal tract, the patient requires the use of Nexium, a proton pump inhibitor, to minimize the side effects from the Celebrex and to avoid a case of a peptic ulcer or GI bleed as a consequence of the therapy provided for his Worker's Compensation injury.  Finally there is the baclofen (Lioresal), a muscle relaxant that is being used for the musculoskeletal component of the pain.  All of these medications are medically necessary for the treatment of this patient's condition and there is absolutely no justification for not paying for these medications simply because they are coming from somebody else other than me.  This needs to be fixed soon since they are being transferred to the patient's primary care physician.  In terms of his spinal cord stimulator, the low I have inserted images of the fractured leads this spinal cord stimulator is an ANS system  (serial number [id number]) that was implanted on 01/09/2002 by Dr. Tora Duck.  The patient indicates having an initial implant in 2001 that had to be revised secondary to lead migration.  When the patient was referred to [place] he already had lead fractures and this is the reason why the device was not working.  However, I am having some difficulty understanding why they  moved to an implant when the patient describes that he really never helped his back that much and that was his primary pain.  In any case, the one problem that we are experiencing at this point is that his condition seems to be worsening and because there are electrodes within the epidural canal that are not MRI compatible, this is limiting his ability to be evaluated with an MRI.  Today I have talked to the patient about the possibility of doing an intrathecal trial with an opioid analgesic.  Should he get good relief of the pain, then we could implant an intrathecal pump and at the same time attempt to remove the electrodes from the spinal cord stimulator.  I have explained to the patient that removing those electrodes may be difficult, if at all possible.  However removing the electrodes would certainly help in opening up the patient's ability to have further MRIs in the future, should they become necessary.  In terms of the intrathecal pump, the intrathecal catheter could be located close to the T7 level in which case it would help [place] control the low back pain and we could also use not only opioid analgesics in the pump but we could take advantage of the delivery system and use local anesthetics.  Hopefully this would help control the pain and allow for [place] to then titrate and perhaps stop the oral opioids.  Today I have provided the patient with some information regarding  this and the plan is to have him think about this and let me know whether or not it is something that he is interested in pursuing.  RTCB: 05/12/2020 Nonopioids transfer 02/05/2020: Neurontin, Celebrex, Nexium, and baclofen.  Pharmacotherapy Assessment   Analgesic: Oxycodone IR 10 mg, 1 tab PO q 6 hrs (40 mg/day of oxycodone) MME/day:72m/day.   Monitoring: [place] PMP: PDMP reviewed during this encounter.       Pharmacotherapy: No side-effects or adverse reactions reported. Compliance: No problems identified. Effectiveness:  Clinically acceptable.  BChauncey Fischer RN  02/05/2020  3:07 PM  Sign when Signing Visit Nursing Pain Medication Assessment:  Safety precautions to be maintained throughout the outpatient stay will include: orient to surroundings, keep bed in low position, maintain call bell within reach at all times, provide assistance with transfer out of bed and ambulation.  Medication Inspection Compliance: Pill count conducted under aseptic conditions, in front of the patient. Neither the pills nor the bottle was removed from the patient's sight at any time. Once count was completed pills were immediately returned to the patient in their original bottle.  Medication #1: Oxycodone IR Pill/Patch Count: 26 of 120 pills remain Pill/Patch Appearance: Markings consistent with prescribed medication Bottle Appearance: Standard pharmacy container. Clearly labeled. Filled Date: 10 / 149/ 21 Last Medication intake:  Today  Medication #2: Oxycodone ER (OxyContin) Pill/Patch Count: 13 of 60 pills remain Pill/Patch Appearance: Markings consistent with prescribed medication Bottle Appearance: Standard pharmacy container. Clearly labeled. Filled Date: 10 / 181/ 21 Last Medication intake:  TodaySafety precautions to be maintained throughout the outpatient stay will include: orient to surroundings, keep bed in low position, maintain call bell within reach at all times, provide assistance with transfer out of bed and ambulation.     UDS:  Summary  Date Value Ref Range Status  10/16/2019 Note  Final    Comment:    ==================================================================== [employer] 13 (MW) ==================================================================== Test                             Result       Flag       Units  Drug Present and Declared for Prescription Verification   Oxycodone                      3280         EXPECTED   ng/mg creat   Oxymorphone                    851          EXPECTED    ng/mg creat   Noroxycodone                   7456         EXPECTED   ng/mg creat   Noroxymorphone                 237          EXPECTED   ng/mg creat    Sources of oxycodone are scheduled prescription medications.    Oxymorphone, noroxycodone, and noroxymorphone are expected    metabolites of oxycodone. Oxymorphone is also available as a    scheduled prescription medication.  ==================================================================== Test                      Result    Flag   Units  Ref Range   Creatinine              127              mg/dL      >=20 ==================================================================== Declared Medications:  The flagging and interpretation on this report are based on the  following declared medications.  Unexpected results may arise from  inaccuracies in the declared medications.   **Note: The testing scope of this panel includes these medications:   Oxycodone   **Note: The testing scope of this panel does not include the  following reported medications:   Baclofen (Lioresal)  Celecoxib (Celebrex)  Esomeprazole (Nexium)  Gabapentin (Neurontin)  Ibuprofen (Advil) ==================================================================== For clinical consultation, please call [phone]. ====================================================================      ROS  Constitutional: Denies any fever or chills Gastrointestinal: No reported hemesis, hematochezia, vomiting, or acute GI distress Musculoskeletal: Denies any acute onset joint swelling, redness, loss of ROM, or weakness Neurological: No reported episodes of acute onset apraxia, aphasia, dysarthria, agnosia, amnesia, paralysis, loss of coordination, or loss of consciousness  Medication Review  baclofen, celecoxib, esomeprazole, gabapentin, ibuprofen, naloxone, and oxyCODONE  History Review  Allergy: Mr. Lattin has No Known Allergies. Drug: Mr. Helzer  reports no history  of drug use. Alcohol:  reports current alcohol use of about 3.0 standard drinks of alcohol per week. Tobacco:  reports that he has quit smoking. His smokeless tobacco use includes chew. Social: Mr. Daversa  reports that he has quit smoking. His smokeless tobacco use includes chew. He reports current alcohol use of about 3.0 standard drinks of alcohol per week. He reports that he does not use drugs. Medical:  has a past medical history of Acute postoperative pain (11/21/2017), Allergic rhinitis, Chronic pain syndrome, DDD (degenerative disc disease), lumbosacral, GERD (gastroesophageal reflux disease), Hyperlipidemia, Osteoarthritis, and S/P appendectomy. Surgical: Mr. Crehan  has a past surgical history that includes Appendectomy; repairs of bilateral ankle fractures; Spinal fusion (N/A); and Spinal cord stimulator implant (N/A). Family: family history includes Diabetes in his maternal grandmother; Heart disease in his father.  Laboratory Chemistry Profile   Renal Lab Results  Component Value Date   BUN 20 12/12/2016   CREATININE 0.74 (L) 12/12/2016   BCR 27 (H) 12/12/2016   GFR 96.24 12/30/2014   GFRAA 120 12/12/2016   GFRNONAA 104 12/12/2016     Hepatic Lab Results  Component Value Date   AST 21 12/12/2016   ALT 12 12/30/2014   ALBUMIN 4.5 12/12/2016   ALKPHOS 66 12/12/2016   HCVAB NEGATIVE 12/30/2014     Electrolytes Lab Results  Component Value Date   NA 140 12/12/2016   K 4.5 12/12/2016   CL 99 12/12/2016   CALCIUM 9.7 12/12/2016   MG 1.9 12/12/2016     Bone Lab Results  Component Value Date   25OHVITD1 44 12/12/2016   25OHVITD2 <1.0 12/12/2016   25OHVITD3 43 12/12/2016     Inflammation (CRP: Acute Phase) (ESR: Chronic Phase) Lab Results  Component Value Date   CRP 2.5 12/12/2016   ESRSEDRATE 10 12/12/2016       Note: Above Lab results reviewed.  Recent Imaging Review  DG PAIN CLINIC C-ARM 1-60 MIN NO REPORT Fluoro was used, but no Radiologist  interpretation will be provided.  Please refer to "NOTES" tab for provider progress note. Note: Reviewed                   Physical Exam  General appearance: Well nourished, well developed, and  well hydrated. In no apparent acute distress Mental status: Alert, oriented x 3 (person, place, & time)       Respiratory: No evidence of acute respiratory distress Eyes: PERLA Vitals: BP 140/73   Pulse 62   Temp (!) 97.2 F (36.2 C)   Ht 5' 10"  (1.778 m)   Wt 235 lb (106.6 kg)   SpO2 99%   BMI 33.72 kg/m  BMI: Estimated body mass index is 33.72 kg/m as calculated from the following:   Height as of this encounter: 5' 10"  (1.778 m).   Weight as of this encounter: 235 lb (106.6 kg). Ideal: Ideal body weight: 73 kg (160 lb 15 oz) Adjusted ideal body weight: 86.4 kg (190 lb 9 oz)  Assessment   Status Diagnosis  Controlled Controlled Controlled 1. Chronic pain syndrome   2. Chronic low back pain (Primary Area of Pain) (Bilateral) (L>R)   3. Chronic lower extremity pain (Secondary Area of Pain) (Left)   4. Failed back surgical syndrome (Fusion L4-S1)   5. Osteoarthritis of lumbar spine   6. Pharmacologic therapy   7. Chronic musculoskeletal pain   8. Neurogenic pain   9. Muscle cramps   10. Gastroesophageal reflux disease without esophagitis   11. Opiate use (60 MME/day)   12. Uncomplicated opioid dependence ([place])   13. Spinal cord stimulator dysfunction (fractured leads)      Updated Problems: Problem  Uncomplicated Opioid Dependence ([facility])  Opiate use (60 MME/day)   The patient initially came to me with an MME of 150 mg/day.  After having completed a "Drug Holiday" we have stayed at 46 MME/day.     Plan of Care  Problem-specific:  No problem-specific Assessment & Plan notes found for this encounter.  Mr. GARDY MONTANARI has a current medication list which includes the following long-term medication(s): oxycodone, baclofen, celecoxib, esomeprazole, gabapentin, [START ON  02/12/2020] oxycodone, [START ON 03/13/2020] oxycodone, [START ON 04/12/2020] oxycodone, [START ON 02/12/2020] oxycodone, [START ON 03/13/2020] oxycodone, and [START ON 04/12/2020] oxycodone.  Pharmacotherapy (Medications Ordered): Meds ordered this encounter  Medications  . baclofen (LIORESAL) 10 MG tablet    Sig: Take 1 tablet (10 mg total) by mouth 3 (three) times daily as needed for muscle spasms.    Dispense:  90 tablet    Refill:  2    Fill one day early if pharmacy is closed on scheduled refill date. May substitute for generic, or similar, if available. Void any older refills or prescriptions of this medication.  Marland Kitchen esomeprazole (NEXIUM) 40 MG capsule    Sig: Take 1 capsule (40 mg total) by mouth at bedtime.    Dispense:  30 capsule    Refill:  2    Fill one day early if pharmacy is closed on scheduled refill date. May substitute for generic, or similar, if available. Void any older refills or prescriptions of this medication.  . celecoxib (CELEBREX) 200 MG capsule    Sig: Take 1 capsule (200 mg total) by mouth daily.    Dispense:  30 capsule    Refill:  2    Fill one day early if pharmacy is closed on scheduled refill date. May substitute for generic, or similar, if available. Void any older refills or prescriptions of this medication.  . gabapentin (NEURONTIN) 600 MG tablet    Sig: Take 2 tablets (1,200 mg total) by mouth at bedtime.    Dispense:  60 tablet    Refill:  2    Fill one day early  if pharmacy is closed on scheduled refill date. May substitute for generic, or similar, if available. Void any older refills or prescriptions of this medication.  Marland Kitchen oxyCODONE (OXY IR/ROXICODONE) 5 MG immediate release tablet    Sig: Take 1 tablet (5 mg total) by mouth every 6 (six) hours as needed for severe pain. Must last 30 days.    Dispense:  120 tablet    Refill:  0    Chronic Pain: STOP Act (Not applicable) Fill 1 day early if closed on refill date. Avoid benzodiazepines within 8 hours  of opioids  . oxyCODONE (OXY IR/ROXICODONE) 5 MG immediate release tablet    Sig: Take 1 tablet (5 mg total) by mouth every 6 (six) hours as needed for severe pain. Must last 30 days.    Dispense:  120 tablet    Refill:  0    Chronic Pain: STOP Act (Not applicable) Fill 1 day early if closed on refill date. Avoid benzodiazepines within 8 hours of opioids  . oxyCODONE (OXY IR/ROXICODONE) 5 MG immediate release tablet    Sig: Take 1 tablet (5 mg total) by mouth every 6 (six) hours as needed for severe pain. Must last 30 days.    Dispense:  120 tablet    Refill:  0    Chronic Pain: STOP Act (Not applicable) Fill 1 day early if closed on refill date. Avoid benzodiazepines within 8 hours of opioids  . oxyCODONE (OXYCONTIN) 10 mg 12 hr tablet    Sig: Take 1 tablet (10 mg total) by mouth every 12 (twelve) hours. Must last 30 days.    Dispense:  60 tablet    Refill:  0    Chronic Pain: STOP Act (Not applicable) Fill 1 day early if closed on refill date. Avoid benzodiazepines within 8 hours of opioids  . oxyCODONE (OXYCONTIN) 10 mg 12 hr tablet    Sig: Take 1 tablet (10 mg total) by mouth every 12 (twelve) hours. Must last 30 days.    Dispense:  60 tablet    Refill:  0    Chronic Pain: STOP Act (Not applicable) Fill 1 day early if closed on refill date. Avoid benzodiazepines within 8 hours of opioids  . oxyCODONE (OXYCONTIN) 10 mg 12 hr tablet    Sig: Take 1 tablet (10 mg total) by mouth every 12 (twelve) hours. Must last 30 days.    Dispense:  60 tablet    Refill:  0    Chronic Pain: STOP Act (Not applicable) Fill 1 day early if closed on refill date. Avoid benzodiazepines within 8 hours of opioids  . naloxone (NARCAN) 2 MG/2ML injection    Sig: Inject 1 mL (1 mg total) into the muscle as needed for up to 2 doses (for opioid overdose). In case of emergency (overdose), inject into muscle of upper arm or leg and call 911.    Dispense:  2 mL    Refill:  0    Please instruct patient on the  emergency use of this medication.   Orders:  No orders of the defined types were placed in this encounter.  Follow-up plan:   Return in about 3 months (around 05/12/2020) for (F2F), (Med Mgmt).      Interventional options:  Considering:   Possible intrathecal injection of diagnostic substance (opioid analgesic) as an intrathecal pump trial.  Should the patient be a good candidate for intrathecal pump, but then we could attempt removing the fractured leads from his nonfunctional spinal cord stimulator.  This would allow the patient to have MRIs in the future. Diagnostic caudal epidural steroid injection+diagnostic epidurogram #1 Possible Racz procedure #1   Palliative PRN treatment(s):   Palliative bilateral lumbar facet block #3  Palliativeright lumbar facetRFA#2(last done on 01/23/18)  Palliativeleft lumbar facetRFA#2(last done 11/21/17)  Therapeutic left caudal ESI #2 (100/100/50)    Recent Visits Date Type Provider Dept  11/13/19 Office Visit [name], MD [facility]  Showing recent visits within past 90 days and meeting all other requirements Today's Visits Date Type Provider Dept  02/05/20 Office Visit Milinda Pointer, MD [facility]  Showing today's visits and meeting all other requirements Future Appointments Date Type Provider Dept  04/29/20 Appointment Milinda Pointer, MD [facility]  Showing future appointments within next 90 days and meeting all other requirements  I discussed the assessment and treatment plan with the patient. The patient was provided an opportunity to ask questions and all were answered. The patient agreed with the plan and demonstrated an understanding of the instructions.  Patient advised to call back or seek an in-person evaluation if the symptoms or condition worsens.  Duration of encounter: 62 minutes.  Note by: Gaspar Cola, MD Date: 02/05/2020; Time: 4:00 PM

## 2020-03-10 ENCOUNTER — Other Ambulatory Visit: Payer: Self-pay | Admitting: Pain Medicine

## 2020-03-10 ENCOUNTER — Telehealth: Payer: Self-pay

## 2020-03-10 DIAGNOSIS — M7918 Myalgia, other site: Secondary | ICD-10-CM

## 2020-03-10 DIAGNOSIS — R252 Cramp and spasm: Secondary | ICD-10-CM

## 2020-03-10 DIAGNOSIS — G8929 Other chronic pain: Secondary | ICD-10-CM

## 2020-03-10 MED ORDER — BACLOFEN 10 MG PO TABS: ORAL_TABLET | ORAL | 2 refills | Status: DC

## 2020-03-10 NOTE — Telephone Encounter (Signed)
Ever since Dr. Laban Roman reduced his medication he has been having increased back pain. Please call him to discuss.

## 2020-03-10 NOTE — Telephone Encounter (Signed)
Patient notified that a new script with adjusted dose was sent to pharmacy.

## 2020-03-10 NOTE — Telephone Encounter (Signed)
Dose was changed from one tid  and 2 at hs, to only one tid when Dr. Cherylann Ratel saw him. Bubble message sent to Dr. Laban Emperor asking  him to change it back.

## 2020-03-13 ENCOUNTER — Other Ambulatory Visit: Payer: Self-pay | Admitting: Pain Medicine

## 2020-03-13 DIAGNOSIS — G894 Chronic pain syndrome: Secondary | ICD-10-CM

## 2020-04-16 ENCOUNTER — Telehealth: Payer: Self-pay | Admitting: Pain Medicine

## 2020-04-16 NOTE — Telephone Encounter (Signed)
Patient would like someone to check fill dates on scripts  please call patient

## 2020-04-23 DIAGNOSIS — H401131 Primary open-angle glaucoma, bilateral, mild stage: Secondary | ICD-10-CM | POA: Diagnosis not present

## 2020-04-27 ENCOUNTER — Ambulatory Visit (INDEPENDENT_AMBULATORY_CARE_PROVIDER_SITE_OTHER): Payer: BC Managed Care – PPO | Admitting: Internal Medicine

## 2020-04-27 ENCOUNTER — Other Ambulatory Visit: Payer: Self-pay

## 2020-04-27 ENCOUNTER — Telehealth: Payer: Self-pay | Admitting: Internal Medicine

## 2020-04-27 ENCOUNTER — Encounter: Payer: Self-pay | Admitting: Internal Medicine

## 2020-04-27 DIAGNOSIS — R252 Cramp and spasm: Secondary | ICD-10-CM | POA: Diagnosis not present

## 2020-04-27 DIAGNOSIS — Z0001 Encounter for general adult medical examination with abnormal findings: Secondary | ICD-10-CM | POA: Insufficient documentation

## 2020-04-27 DIAGNOSIS — M47816 Spondylosis without myelopathy or radiculopathy, lumbar region: Secondary | ICD-10-CM

## 2020-04-27 DIAGNOSIS — Z0000 Encounter for general adult medical examination without abnormal findings: Secondary | ICD-10-CM

## 2020-04-27 DIAGNOSIS — M792 Neuralgia and neuritis, unspecified: Secondary | ICD-10-CM

## 2020-04-27 DIAGNOSIS — M7918 Myalgia, other site: Secondary | ICD-10-CM

## 2020-04-27 DIAGNOSIS — G8929 Other chronic pain: Secondary | ICD-10-CM

## 2020-04-27 DIAGNOSIS — G894 Chronic pain syndrome: Secondary | ICD-10-CM

## 2020-04-27 LAB — COMPREHENSIVE METABOLIC PANEL
ALT: 18 U/L (ref 0–53)
AST: 20 U/L (ref 0–37)
Albumin: 4.7 g/dL (ref 3.5–5.2)
Alkaline Phosphatase: 69 U/L (ref 39–117)
BUN: 22 mg/dL (ref 6–23)
CO2: 28 mEq/L (ref 19–32)
Calcium: 10.3 mg/dL (ref 8.4–10.5)
Chloride: 102 mEq/L (ref 96–112)
Creatinine, Ser: 0.87 mg/dL (ref 0.40–1.50)
GFR: 95.16 mL/min (ref >60.00)
Glucose, Bld: 88 mg/dL (ref 70–99)
Potassium: 3.9 mEq/L (ref 3.5–5.1)
Sodium: 137 mEq/L (ref 135–145)
Total Bilirubin: 0.6 mg/dL (ref 0.2–1.2)
Total Protein: 7.3 g/dL (ref 6.0–8.3)

## 2020-04-27 LAB — LIPID PANEL
Cholesterol: 186 mg/dL (ref 0–200)
HDL: 53.9 mg/dL (ref >39.00)
LDL Cholesterol: 116 mg/dL — ABNORMAL HIGH (ref 0–99)
NonHDL: 132.35
Total CHOL/HDL Ratio: 3
Triglycerides: 82 mg/dL (ref 0.0–149.0)
VLDL: 16.4 mg/dL (ref 0.0–40.0)

## 2020-04-27 LAB — CBC
HCT: 42.3 % (ref 39.0–52.0)
Hemoglobin: 14.2 g/dL (ref 13.0–17.0)
MCHC: 33.5 g/dL (ref 30.0–36.0)
MCV: 87.3 fl (ref 78.0–100.0)
Platelets: 215 10*3/uL (ref 150.0–400.0)
RBC: 4.85 Mil/uL (ref 4.22–5.81)
RDW: 13.8 % (ref 11.5–15.5)
WBC: 6.6 10*3/uL (ref 4.0–10.5)

## 2020-04-27 LAB — HEMOGLOBIN A1C: Hgb A1c MFr Bld: 5.7 % (ref 4.6–6.5)

## 2020-04-27 MED ORDER — BACLOFEN 10 MG PO TABS: ORAL_TABLET | ORAL | 11 refills | Status: DC

## 2020-04-27 MED ORDER — CELECOXIB 200 MG PO CAPS: 200.0000 mg | ORAL_CAPSULE | Freq: Every day | ORAL | 3 refills | Status: DC

## 2020-04-27 MED ORDER — GABAPENTIN 600 MG PO TABS
1200.0000 mg | ORAL_TABLET | Freq: Every day | ORAL | 3 refills | Status: DC
Start: 2020-04-27 — End: 2020-04-28

## 2020-04-27 NOTE — Patient Instructions (Signed)

## 2020-04-27 NOTE — Progress Notes (Signed)
   Subjective:   Patient ID: Lance Roman, male    DOB: 1960/12/09, 60 y.o.   MRN: [id number]  HPI The patient is a 60 YO man coming in for physical. Still seeing pain management and stable overall with that.    PMH, [facility], social history reviewed and updated  Review of Systems  Constitutional: Negative.   HENT: Negative.   Eyes: Negative.   Respiratory: Negative for cough, chest tightness and shortness of breath.   Cardiovascular: Negative for chest pain, palpitations and leg swelling.  Gastrointestinal: Negative for abdominal distention, abdominal pain, constipation, diarrhea, nausea and vomiting.  Musculoskeletal: Positive for arthralgias and back pain.  Skin: Negative.   Neurological: Negative.   Psychiatric/Behavioral: Negative.     Objective:  Physical Exam Constitutional:      Appearance: He is well-developed and well-nourished.  HENT:     Head: Normocephalic and atraumatic.  Eyes:     Extraocular Movements: EOM normal.  Cardiovascular:     Rate and Rhythm: Normal rate and regular rhythm.  Pulmonary:     Effort: Pulmonary effort is normal. No respiratory distress.     Breath sounds: Normal breath sounds. No wheezing or rales.  Abdominal:     General: Bowel sounds are normal. There is no distension.     Palpations: Abdomen is soft.     Tenderness: There is no abdominal tenderness. There is no rebound.  Musculoskeletal:        General: No edema.     Cervical back: Normal range of motion.  Skin:    General: Skin is warm and dry.  Neurological:     Mental Status: He is alert and oriented to person, place, and time.     Coordination: Coordination normal.  Psychiatric:        Mood and Affect: Mood and affect normal.    Vitals:   04/27/20 1409  BP: 118/78  Pulse: 63  Resp: 18  Temp: 98.5 F (36.9 C)  TempSrc: Oral  SpO2: 98%  Weight: 232 lb 9.6 oz (105.5 kg)  Height: 5\' 10"  (1.778 m)   This visit occurred during the SARS-CoV-2 public health emergency.   Safety protocols were in place, including screening questions prior to the visit, additional usage of staff PPE, and extensive cleaning of exam room while observing appropriate contact time as indicated for disinfecting solutions.   Assessment & Plan:

## 2020-04-27 NOTE — Assessment & Plan Note (Signed)
Flu shot declines. Covid-19 counseled declines. Shingrix declines. Tetanus up to date. Cologuard due declines. Counseled about sun safety and mole surveillance. Counseled about the dangers of distracted driving. Given 10 year screening recommendations.

## 2020-04-27 NOTE — Telephone Encounter (Signed)
    Patient requesting  baclofen (LIORESAL) 10 MG tablet celecoxib (CELEBREX) 200 MG capsule gabapentin (NEURONTIN) 600 MG tablet be sent to [id number] - [place], [place] - [address]  He states [facility] not [facility]

## 2020-04-27 NOTE — Assessment & Plan Note (Signed)
Needs rx for his non-controlled medications as his pain doctor will not prescribe any longer. Rx for celebrex, gabapentin and baclofen.

## 2020-04-28 MED ORDER — CELECOXIB 200 MG PO CAPS: 200.0000 mg | ORAL_CAPSULE | Freq: Every day | ORAL | 3 refills | Status: DC

## 2020-04-28 MED ORDER — BACLOFEN 10 MG PO TABS: ORAL_TABLET | ORAL | 11 refills | Status: DC

## 2020-04-28 MED ORDER — GABAPENTIN 600 MG PO TABS: 1200.0000 mg | ORAL_TABLET | Freq: Every day | ORAL | 3 refills | Status: DC

## 2020-04-28 NOTE — Telephone Encounter (Signed)
[facility].

## 2020-04-29 ENCOUNTER — Encounter: Payer: Self-pay | Admitting: Pain Medicine

## 2020-04-29 ENCOUNTER — Other Ambulatory Visit: Payer: Self-pay

## 2020-04-29 ENCOUNTER — Ambulatory Visit: Payer: No Typology Code available for payment source | Attending: Pain Medicine | Admitting: Pain Medicine

## 2020-04-29 VITALS — BP 125/78 | HR 64 | Temp 97.3°F | Resp 16 | Ht 70.0 in | Wt 235.0 lb

## 2020-04-29 DIAGNOSIS — T85192S Other mechanical complication of implanted electronic neurostimulator (electrode) of spinal cord, sequela: Secondary | ICD-10-CM

## 2020-04-29 DIAGNOSIS — M4310 Spondylolisthesis, site unspecified: Secondary | ICD-10-CM | POA: Diagnosis present

## 2020-04-29 DIAGNOSIS — G894 Chronic pain syndrome: Secondary | ICD-10-CM

## 2020-04-29 DIAGNOSIS — F1120 Opioid dependence, uncomplicated: Secondary | ICD-10-CM | POA: Diagnosis present

## 2020-04-29 DIAGNOSIS — Z79899 Other long term (current) drug therapy: Secondary | ICD-10-CM | POA: Diagnosis present

## 2020-04-29 DIAGNOSIS — M79605 Pain in left leg: Secondary | ICD-10-CM | POA: Insufficient documentation

## 2020-04-29 DIAGNOSIS — M5442 Lumbago with sciatica, left side: Secondary | ICD-10-CM | POA: Insufficient documentation

## 2020-04-29 DIAGNOSIS — M961 Postlaminectomy syndrome, not elsewhere classified: Secondary | ICD-10-CM

## 2020-04-29 DIAGNOSIS — G8929 Other chronic pain: Secondary | ICD-10-CM

## 2020-04-29 MED ORDER — OXYCODONE HCL ER 10 MG PO T12A: 10.0000 mg | EXTENDED_RELEASE_TABLET | Freq: Two times a day (BID) | ORAL | 0 refills | Status: DC

## 2020-04-29 MED ORDER — OXYCODONE HCL ER 10 MG PO T12A
10.0000 mg | EXTENDED_RELEASE_TABLET | Freq: Two times a day (BID) | ORAL | 0 refills | Status: DC
Start: 2020-06-11 — End: 2020-08-10

## 2020-04-29 MED ORDER — OXYCODONE HCL 5 MG PO TABS: 5.0000 mg | ORAL_TABLET | Freq: Four times a day (QID) | ORAL | 0 refills | Status: DC | PRN

## 2020-04-29 MED ORDER — OXYCODONE HCL ER 10 MG PO T12A
10.0000 mg | EXTENDED_RELEASE_TABLET | Freq: Two times a day (BID) | ORAL | 0 refills | Status: DC
Start: 2020-05-12 — End: 2020-08-10

## 2020-04-29 NOTE — Progress Notes (Signed)
Nursing Pain Medication Assessment:  Safety precautions to be maintained throughout the outpatient stay will include: orient to surroundings, keep bed in low position, maintain call bell within reach at all times, provide assistance with transfer out of bed and ambulation.  Medication Inspection Compliance: Lance Roman did not comply with our request to bring his pills to be counted. He was reminded that bringing the medication bottles, even when empty, is a requirement.  Medication: None brought in. Pill/Patch Count: None available to be counted. Bottle Appearance: No container available. Did not bring bottle(s) to appointment. Filled Date: N/A Last Medication intake:  Today  Reminded to bring to appointment for count

## 2020-04-29 NOTE — Patient Instructions (Signed)
____________________________________________________________________________________________  Preparing for your procedure (without sedation)  Procedure appointments are limited to planned procedures: . No Prescription Refills. . No disability issues will be discussed. . No medication changes will be discussed.  Instructions: . Oral Intake: Do not eat or drink anything for at least 6 hours prior to your procedure. (Exception: Blood Pressure Medication. See below.) . Transportation: Unless otherwise stated by your physician, you may drive yourself after the procedure. . Blood Pressure Medicine: Do not forget to take your blood pressure medicine with a sip of water the morning of the procedure. If your Diastolic (lower reading)is above 100 mmHg, elective cases will be cancelled/rescheduled. . Blood thinners: These will need to be stopped for procedures. Notify our staff if you are taking any blood thinners. Depending on which one you take, there will be specific instructions on how and when to stop it. . Diabetics on insulin: Notify the staff so that you can be scheduled 1st case in the morning. If your diabetes requires high dose insulin, take only  of your normal insulin dose the morning of the procedure and notify the staff that you have done so. . Preventing infections: Shower with an antibacterial soap the morning of your procedure.  . Build-up your immune system: Take 1000 mg of Vitamin C with every meal (3 times a day) the day prior to your procedure. . Antibiotics: Inform the staff if you have a condition or reason that requires you to take antibiotics before dental procedures. . Pregnancy: If you are pregnant, call and cancel the procedure. . Sickness: If you have a cold, fever, or any active infections, call and cancel the procedure. . Arrival: You must be in the facility at least 30 minutes prior to your scheduled procedure. . Children: Do not bring any children with you. . Dress  appropriately: Bring dark clothing that you would not mind if they get stained. . Valuables: Do not bring any jewelry or valuables.  Reasons to call and reschedule or cancel your procedure: (Following these recommendations will minimize the risk of a serious complication.) . Surgeries: Avoid having procedures within 2 weeks of any surgery. (Avoid for 2 weeks before or after any surgery). . Flu Shots: Avoid having procedures within 2 weeks of a flu shots or . (Avoid for 2 weeks before or after immunizations). . Barium: Avoid having a procedure within 7-10 days after having had a radiological study involving the use of radiological contrast. (Myelograms, Barium swallow or enema study). . Heart attacks: Avoid any elective procedures or surgeries for the initial 6 months after a "Myocardial Infarction" (Heart Attack). . Blood thinners: It is imperative that you stop these medications before procedures. Let us know if you if you take any blood thinner.  . Infection: Avoid procedures during or within two weeks of an infection (including chest colds or gastrointestinal problems). Symptoms associated with infections include: Localized redness, fever, chills, night sweats or profuse sweating, burning sensation when voiding, cough, congestion, stuffiness, runny nose, sore throat, diarrhea, nausea, vomiting, cold or Flu symptoms, recent or current infections. It is specially important if the infection is over the area that we intend to treat. . Heart and lung problems: Symptoms that may suggest an active cardiopulmonary problem include: cough, chest pain, breathing difficulties or shortness of breath, dizziness, ankle swelling, uncontrolled high or unusually low blood pressure, and/or palpitations. If you are experiencing any of these symptoms, cancel your procedure and contact your primary care physician for an evaluation.  Remember:  Regular   Business hours are:  Monday to Thursday 8:00 AM to 4:00  PM  Provider's Schedule: Caleigh Rabelo, MD:  Procedure days: Tuesday and Thursday 7:30 AM to 4:00 PM  Bilal Lateef, MD:  Procedure days: Monday and Wednesday 7:30 AM to 4:00 PM ____________________________________________________________________________________________    

## 2020-04-29 NOTE — Progress Notes (Signed)
PROVIDER NOTE: Information contained herein reflects review and annotations entered in association with encounter. Interpretation of such information and data should be left to medically-trained personnel. Information provided to patient can be located elsewhere in the medical record under "Patient Instructions". Document created using STT-dictation technology, any transcriptional errors that may result from process are unintentional.    Patient: Lance Roman  Service Category: E/M  Provider: Gaspar Cola, MD  DOB: 1960-07-26  DOS: 04/29/2020  Specialty: Interventional Pain Management  MRN: [id number]  Setting: Ambulatory outpatient  PCP: Hoyt Koch, MD  Type: Established Patient    Referring Provider: Hoyt Koch, *  Location: Office  Delivery: Face-to-face     HPI  [name], a 60 y.o. year old male, is here today because of his Chronic pain syndrome [G89.4]. Mr. Marcucci primary complain today is Back Pain (lower) Last encounter: My last encounter with him was on 04/16/2020. Pertinent problems: Mr. Paulsen has Chronic low back pain (1ry area of Pain) (Bilateral) (L>R); Chronic pain syndrome; Chronic lower extremity pain (2ry area of Pain) (Left); Chronic lumbar radicular pain (L5) (Left); Retrolisthesis (4-5 mm) (L3 over L4) (L2 over L3; Failed back surgical syndrome (Fusion L4-S1); Lumbar facet hypertrophy (Bilateral); Lumbar facet syndrome (Bilateral) (L>R); Lumbar facet osteoarthritis (Bilateral); Osteoarthritis of lumbar spine; Spondylosis without myelopathy or radiculopathy, lumbosacral region; Other specified dorsopathies, sacral and sacrococcygeal region; Chronic hip pain (Left); Chronic knee pain (Left); DDD (degenerative disc disease), lumbosacral; Neurogenic pain; Trigger point with back pain; Chronic upper back pain; Chronic musculoskeletal pain; and Epidural fibrosis on their pertinent problem list. Pain Assessment: Severity of Chronic pain is reported as  a 6 /10. Location: Back Lower/radiates down left leg to heel in the back. Onset: More than a month ago. Quality: Aching. Timing: Intermittent. Modifying factor(s): medication. Vitals:  height is 5' 10"  (1.778 m) and weight is 235 lb (106.6 kg). His temporal temperature is 97.3 F (36.3 C) (abnormal). His blood pressure is 125/78 and his pulse is 64. His respiration is 16 and oxygen saturation is 100%.   Reason for encounter: medication management.   The patient indicates doing well with the current medication regimen. No adverse reactions or side effects reported to the medications.  The patient refers that this particular combination of medications works relatively well for him.  Today we talked about spinal cord stimulator and I asked him what he wanted to do with this, he indicated that he would not mind if we have it removed, especially if he is a good candidate for an intrathecal pump.  Previously we had talked about this and today he refers being interested in pursuing the option of an intrathecal pump to control his pain.  We will go ahead and schedule him to come in for an intrathecal pump trial and if he is a good candidate, then we will consider the implant with an attempt at removing the broken spinal cord stimulator leads and the battery.  Today I spent quite a bit of time reviewing his records and images.  Below he can see the images from his spinal cord stimulator with the broken leads.  Because he gets no stimulation from them, he has not been using the battery either.  RTCB: 08/10/2020 Nonopioids transferred 02/05/2020: Baclofen              Pharmacotherapy Assessment   Analgesic: Oxycodone IR 5 mg, 1 tab PO q 6 hrs (20 mg/day of oxycodone) + OxyContin 10 mg, 1 tab  p.o. twice daily (20 mg/day of oxycodone) (total of 40 mg/day of oxycodone) MME/day:60m/day.   Monitoring: [place] PMP: PDMP reviewed during this encounter.       Pharmacotherapy: No side-effects or adverse  reactions reported. Compliance: No problems identified. Effectiveness: Clinically acceptable.  TDewayne Shorter RN  04/29/2020  8:52 AM  Signed Nursing Pain Medication Assessment:  Safety precautions to be maintained throughout the outpatient stay will include: orient to surroundings, keep bed in low position, maintain call bell within reach at all times, provide assistance with transfer out of bed and ambulation.  Medication Inspection Compliance: Mr. PFurneydid not comply with our request to bring his pills to be counted. He was reminded that bringing the medication bottles, even when empty, is a requirement.  Medication: None brought in. Pill/Patch Count: None available to be counted. Bottle Appearance: No container available. Did not bring bottle(s) to appointment. Filled Date: N/A Last Medication intake:  Today  Reminded to bring to appointment for count    UDS:  Summary  Date Value Ref Range Status  10/16/2019 Note  Final    Comment:    ==================================================================== [employer] [facility] (MW) ==================================================================== Test                             Result       Flag       Units  Drug Present and Declared for Prescription Verification   Oxycodone                      3280         EXPECTED   ng/mg creat   Oxymorphone                    851          EXPECTED   ng/mg creat   Noroxycodone                   7456         EXPECTED   ng/mg creat   Noroxymorphone                 237          EXPECTED   ng/mg creat    Sources of oxycodone are scheduled prescription medications.    Oxymorphone, noroxycodone, and noroxymorphone are expected    metabolites of oxycodone. Oxymorphone is also available as a    scheduled prescription medication.  ==================================================================== Test                      Result    Flag   Units      Ref Range   Creatinine              127               mg/dL      >=20 ==================================================================== Declared Medications:  The flagging and interpretation on this report are based on the  following declared medications.  Unexpected results may arise from  inaccuracies in the declared medications.   **Note: The testing scope of this panel includes these medications:   Oxycodone   **Note: The testing scope of this panel does not include the  following reported medications:   Baclofen (Lioresal)  Celecoxib (Celebrex)  Esomeprazole (Nexium)  Gabapentin (Neurontin)  Ibuprofen (Advil) ==================================================================== For clinical consultation, please call ([phone] ====================================================================  ROS  Constitutional: Denies any fever or chills Gastrointestinal: No reported hemesis, hematochezia, vomiting, or acute GI distress Musculoskeletal: Denies any acute onset joint swelling, redness, loss of ROM, or weakness Neurological: No reported episodes of acute onset apraxia, aphasia, dysarthria, agnosia, amnesia, paralysis, loss of coordination, or loss of consciousness  Medication Review  baclofen, celecoxib, esomeprazole, gabapentin, ibuprofen, naloxone, and oxyCODONE  History Review  Allergy: Mr. Alpern has No Known Allergies. Drug: Mr. Soffer  reports no history of drug use. Alcohol:  reports current alcohol use of about 3.0 standard drinks of alcohol per week. Tobacco:  reports that he has quit smoking. His smokeless tobacco use includes chew. Social: Mr. Bless  reports that he has quit smoking. His smokeless tobacco use includes chew. He reports current alcohol use of about 3.0 standard drinks of alcohol per week. He reports that he does not use drugs. Medical:  has a past medical history of Acute postoperative pain (11/21/2017), Allergic rhinitis, Chronic pain syndrome, DDD (degenerative disc disease),  lumbosacral, GERD (gastroesophageal reflux disease), Hyperlipidemia, Osteoarthritis, and S/P appendectomy. Surgical: Mr. Bartoli  has a past surgical history that includes Appendectomy; repairs of bilateral ankle fractures; Spinal fusion (N/A); and Spinal cord stimulator implant (N/A). Family: family history includes Diabetes in his maternal grandmother; Heart disease in his father.  Laboratory Chemistry Profile   Renal Lab Results  Component Value Date   BUN 22 04/27/2020   CREATININE 0.87 04/27/2020   BCR 27 (H) 12/12/2016   GFR 95.16 04/27/2020   GFRAA 120 12/12/2016   GFRNONAA 104 12/12/2016     Hepatic Lab Results  Component Value Date   AST 20 04/27/2020   ALT 18 04/27/2020   ALBUMIN 4.7 04/27/2020   ALKPHOS 69 04/27/2020   HCVAB NEGATIVE 12/30/2014     Electrolytes Lab Results  Component Value Date   NA 137 04/27/2020   K 3.9 04/27/2020   CL 102 04/27/2020   CALCIUM 10.3 04/27/2020   MG 1.9 12/12/2016     Bone Lab Results  Component Value Date   25OHVITD1 44 12/12/2016   25OHVITD2 <1.0 12/12/2016   25OHVITD3 43 12/12/2016     Inflammation (CRP: Acute Phase) (ESR: Chronic Phase) Lab Results  Component Value Date   CRP 2.5 12/12/2016   ESRSEDRATE 10 12/12/2016       Note: Above Lab results reviewed.  Recent Imaging Review  DG PAIN CLINIC C-ARM 1-60 MIN NO REPORT Fluoro was used, but no Radiologist interpretation will be provided.  Please refer to "NOTES" tab for provider progress note. Note: Reviewed        Physical Exam  General appearance: Well nourished, well developed, and well hydrated. In no apparent acute distress Mental status: Alert, oriented x 3 (person, place, & time)       Respiratory: No evidence of acute respiratory distress Eyes: PERLA Vitals: BP 125/78 (BP Location: Right Arm, Patient Position: Sitting, Cuff Size: Large)   Pulse 64   Temp (!) 97.3 F (36.3 C) (Temporal)   Resp 16   Ht 5' 10"  (1.778 m)   Wt 235 lb (106.6 kg)    SpO2 100%   BMI 33.72 kg/m  BMI: Estimated body mass index is 33.72 kg/m as calculated from the following:   Height as of this encounter: 5' 10"  (1.778 m).   Weight as of this encounter: 235 lb (106.6 kg). Ideal: Ideal body weight: 73 kg (160 lb 15 oz) Adjusted ideal body weight: 86.4 kg (190 lb 9 oz)  Assessment  Status Diagnosis  Controlled Controlled Controlled 1. Chronic pain syndrome   2. Chronic low back pain (1ry area of Pain) (Bilateral) (L>R)   3. Chronic lower extremity pain (2ry area of Pain) (Left)   4. Failed back surgical syndrome (Fusion L4-S1)   5. Retrolisthesis (4-5 mm) (L3 over L4) (L2 over L3   6. Pharmacologic therapy   7. Uncomplicated opioid dependence (Graf)   8. Spinal cord stimulator dysfunction (fractured leads)      Updated Problems: Problem  Chronic lower extremity pain (2ry area of Pain) (Left)  Chronic low back pain (1ry area of Pain) (Bilateral) (L>R)  Routine General Medical Examination At [facility]  Problem-specific:  No problem-specific Assessment & Plan notes found for this encounter.  Mr. JAKARI SADA has a current medication list which includes the following long-term medication(s): baclofen, celecoxib, esomeprazole, gabapentin, [START ON 05/12/2020] oxycodone, [START ON 06/11/2020] oxycodone, [START ON 07/11/2020] oxycodone, oxycodone, [START ON 05/12/2020] oxycodone, [START ON 06/11/2020] oxycodone, and [START ON 07/11/2020] oxycodone.  Pharmacotherapy (Medications Ordered): Meds ordered this encounter  Medications  . oxyCODONE (OXY IR/ROXICODONE) 5 MG immediate release tablet    Sig: Take 1 tablet (5 mg total) by mouth every 6 (six) hours as needed for severe pain. Must last 30 days.    Dispense:  120 tablet    Refill:  0    Chronic Pain: STOP Act (Not applicable) Fill 1 day early if closed on refill date. Avoid benzodiazepines within 8 hours of opioids  . oxyCODONE (OXY IR/ROXICODONE) 5 MG immediate  release tablet    Sig: Take 1 tablet (5 mg total) by mouth every 6 (six) hours as needed for severe pain. Must last 30 days.    Dispense:  120 tablet    Refill:  0    Chronic Pain: STOP Act (Not applicable) Fill 1 day early if closed on refill date. Avoid benzodiazepines within 8 hours of opioids  . oxyCODONE (OXY IR/ROXICODONE) 5 MG immediate release tablet    Sig: Take 1 tablet (5 mg total) by mouth every 6 (six) hours as needed for severe pain. Must last 30 days.    Dispense:  120 tablet    Refill:  0    Chronic Pain: STOP Act (Not applicable) Fill 1 day early if closed on refill date. Avoid benzodiazepines within 8 hours of opioids  . oxyCODONE (OXYCONTIN) 10 mg 12 hr tablet    Sig: Take 1 tablet (10 mg total) by mouth every 12 (twelve) hours. Must last 30 days.    Dispense:  60 tablet    Refill:  0    Chronic Pain: STOP Act (Not applicable) Fill 1 day early if closed on refill date. Avoid benzodiazepines within 8 hours of opioids  . oxyCODONE (OXYCONTIN) 10 mg 12 hr tablet    Sig: Take 1 tablet (10 mg total) by mouth every 12 (twelve) hours. Must last 30 days.    Dispense:  60 tablet    Refill:  0    Chronic Pain: STOP Act (Not applicable) Fill 1 day early if closed on refill date. Avoid benzodiazepines within 8 hours of opioids  . oxyCODONE (OXYCONTIN) 10 mg 12 hr tablet    Sig: Take 1 tablet (10 mg total) by mouth every 12 (twelve) hours. Must last 30 days.    Dispense:  60 tablet    Refill:  0    Chronic Pain: STOP Act (Not applicable) Fill 1 day early  if closed on refill date. Avoid benzodiazepines within 8 hours of opioids   Orders:  Orders Placed This Encounter  Procedures  . PUMP TRIAL    Standing Status:   Future    Standing Expiration Date:   10/27/2020    Scheduling Instructions:     Same day, outpatient trial. Schedule the patient to be the first case of the morning. Estimated for patient to be here for 4 to 6 hours. Have nurse order necessary medications. Have a spinal  tray available.    Order Specific Question:   Where will this procedure be performed?    Answer:   [facility]   Follow-up plan:   Return in about 3 months (around 08/10/2020) for (F2F), (Med Mgmt), in addition, Procedure (no sedation): Intrathecal Pump Trial .      Interventional options:  Considering:   Possible intrathecal injection of diagnostic substance (opioid analgesic) as an intrathecal pump trial.  Should the patient be a good candidate for intrathecal pump, but then we could attempt removing the fractured leads from his nonfunctional spinal cord stimulator.  This would allow the patient to have MRIs in the future. Diagnostic caudal epidural steroid injection+diagnostic epidurogram #1 Possible Racz procedure #1   Palliative PRN treatment(s):   Palliative bilateral lumbar facet block #3  Palliativeright lumbar facetRFA#2(last done on 01/23/18)  Palliativeleft lumbar facetRFA#2(last done 11/21/17)  Therapeutic left caudal ESI #2 (100/100/50)     Recent Visits Date Type Provider Dept  02/05/20 Office Visit Milinda Pointer, MD [facility]  Showing recent visits within past 90 days and meeting all other requirements Today's Visits Date Type Provider Dept  04/29/20 Office Visit Milinda Pointer, MD [facility]  Showing today's visits and meeting all other requirements Future Appointments No visits were found meeting these conditions. Showing future appointments within next 90 days and meeting all other requirements  I discussed the assessment and treatment plan with the patient. The patient was provided an opportunity to ask questions and all were answered. The patient agreed with the plan and demonstrated an understanding of the instructions.  Patient advised to call back or seek an in-person evaluation if the symptoms or condition worsens.  Duration of encounter: 103 minutes.  Note by: Gaspar Cola, MD Date: 04/29/2020;  Time: 10:48 AM

## 2020-07-22 DIAGNOSIS — H401131 Primary open-angle glaucoma, bilateral, mild stage: Secondary | ICD-10-CM | POA: Diagnosis not present

## 2020-08-06 NOTE — Progress Notes (Signed)
PROVIDER NOTE: Information contained herein reflects review and annotations entered in association with encounter. Interpretation of such information and data should be left to medically-trained personnel. Information provided to patient can be located elsewhere in the medical record under "Patient Instructions". Document created using STT-dictation technology, any transcriptional errors that may result from process are unintentional.    Patient: Lance Roman  Service Category: E/M  Provider: Gaspar Cola, MD  DOB: [birth year]  DOS: 08/10/2020  Specialty: Interventional Pain Management  MRN: [id number]  Setting: Ambulatory outpatient  PCP: Hoyt Koch, MD  Type: Established Patient    Referring Provider: Hoyt Koch, *  Location: Office  Delivery: Face-to-face     HPI  [name], a 59 y.o. year old male, is here today because of his Chronic pain syndrome [G89.4]. Lance Roman primary complain today is Back Pain (low) and Leg Pain (left) Last encounter: My last encounter with him was on 04/29/2020. Pertinent problems: Lance Roman has Chronic low back pain (1ry area of Pain) (Bilateral) (L>R); Chronic pain syndrome; Chronic lower extremity pain (2ry area of Pain) (Left); Chronic lumbar radicular pain (L5) (Left); Retrolisthesis (4-5 mm) (L3 over L4) (L2 over L3; Failed back surgical syndrome (Fusion L4-S1); Lumbar facet hypertrophy (Bilateral); Lumbar facet syndrome (Bilateral) (L>R); Lumbar facet osteoarthritis (Bilateral); Osteoarthritis of lumbar spine; Spondylosis without myelopathy or radiculopathy, lumbosacral region; Other specified dorsopathies, sacral and sacrococcygeal region; Chronic hip pain (Left); Chronic knee pain (Left); DDD (degenerative disc disease), lumbosacral; Neurogenic pain; Trigger point with back pain; Chronic upper back pain; Chronic musculoskeletal pain; and Epidural fibrosis on their pertinent problem list. Pain Assessment: Severity of Chronic  pain is reported as a 7 /10. Location: Back Lower/left leg. Onset: More than a month ago. Quality: Aching,Burning,Radiating,Shooting. Timing: Intermittent. Modifying factor(s): medications, rest, exercise. Vitals:  height is 5' 10"  (1.778 m) and weight is 230 lb (104.3 kg). His oral temperature is 98.4 F (36.9 C). His blood pressure is 130/70 and his pulse is 64. His respiration is 16 and oxygen saturation is 99%.   Reason for encounter: medication management.   The patient indicates doing well with the current medication regimen. No adverse reactions or side effects reported to the medications.   RTCB: 11/08/2020 Nonopioids transferred 02/05/2020: Baclofen, Celebrex & Neurontin (All associated to treatment of worker's compensation case. Transferred to PCP for long term monitoring and managed.)  Pharmacotherapy Assessment   Analgesic: Oxycodone IR 5 mg, 1 tab PO q 6 hrs (20 mg/day of oxycodone) + OxyContin 10 mg, 1 tab p.o. twice daily (20 mg/day of oxycodone) (total of 40 mg/day of oxycodone) MME/day:78m/day.   Monitoring: [place] PMP: PDMP reviewed during this encounter.       Pharmacotherapy: No side-effects or adverse reactions reported. Compliance: No problems identified. Effectiveness: Clinically acceptable.  SHart Rochester RN  08/10/2020  8:03 AM  Sign when Signing Visit Nursing Pain Medication Assessment:  Safety precautions to be maintained throughout the outpatient stay will include: orient to surroundings, keep bed in low position, maintain call bell within reach at all times, provide assistance with transfer out of bed and ambulation.  Medication Inspection Compliance: Pill count conducted under aseptic conditions, in front of the patient. Neither the pills nor the bottle was removed from the patient's sight at any time. Once count was completed pills were immediately returned to the patient in their original bottle.  Medication #1: Oxycodone ER (OxyContin) Pill/Patch Count:  0 of 60 pills remain Pill/Patch Appearance: Markings consistent with  prescribed medication Bottle Appearance: Standard pharmacy container. Clearly labeled. Filled Date: 04 / 16 / 2022 Last Medication intake:  Yesterday  Medication #2: Oxycodone IR Pill/Patch Count: 0 of 120 patches remain Pill/Patch Appearance: Markings consistent with prescribed medication Bottle Appearance: Standard pharmacy container. Clearly labeled. Filled Date: 04 / 16 / 2022 Last Medication intake:  Yesterday    UDS:  Summary  Date Value Ref Range Status  10/16/2019 Note  Final    Comment:    ==================================================================== [employer] Select 13 (MW) ==================================================================== Test                             Result       Flag       Units  Drug Present and Declared for Prescription Verification   Oxycodone                      3280         EXPECTED   ng/mg creat   Oxymorphone                    851          EXPECTED   ng/mg creat   Noroxycodone                   7456         EXPECTED   ng/mg creat   Noroxymorphone                 237          EXPECTED   ng/mg creat    Sources of oxycodone are scheduled prescription medications.    Oxymorphone, noroxycodone, and noroxymorphone are expected    metabolites of oxycodone. Oxymorphone is also available as a    scheduled prescription medication.  ==================================================================== Test                      Result    Flag   Units      Ref Range   Creatinine              127              mg/dL      >=20 ==================================================================== Declared Medications:  The flagging and interpretation on this report are based on the  following declared medications.  Unexpected results may arise from  inaccuracies in the declared medications.   **Note: The testing scope of this panel includes these medications:   Oxycodone    **Note: The testing scope of this panel does not include the  following reported medications:   Baclofen (Lioresal)  Celecoxib (Celebrex)  Esomeprazole (Nexium)  Gabapentin (Neurontin)  Ibuprofen (Advil) ==================================================================== For clinical consultation, please call [phone]. ====================================================================      ROS  Constitutional: Denies any fever or chills Gastrointestinal: No reported hemesis, hematochezia, vomiting, or acute GI distress Musculoskeletal: Denies any acute onset joint swelling, redness, loss of ROM, or weakness Neurological: No reported episodes of acute onset apraxia, aphasia, dysarthria, agnosia, amnesia, paralysis, loss of coordination, or loss of consciousness  Medication Review  baclofen, celecoxib, esomeprazole, gabapentin, ibuprofen, naloxone, and oxyCODONE  History Review  Allergy: Lance Roman has No Known Allergies. Drug: Lance Roman  reports no history of drug use. Alcohol:  reports current alcohol use of about 3.0 standard drinks of alcohol per week. Tobacco:  reports that he has quit smoking. His  smokeless tobacco use includes chew. Social: Lance Roman  reports that he has quit smoking. His smokeless tobacco use includes chew. He reports current alcohol use of about 3.0 standard drinks of alcohol per week. He reports that he does not use drugs. Medical:  has a past medical history of Acute postoperative pain (11/21/2017), Allergic rhinitis, Chronic pain syndrome, DDD (degenerative disc disease), lumbosacral, GERD (gastroesophageal reflux disease), Hyperlipidemia, Osteoarthritis, and S/P appendectomy. Surgical: Lance Roman  has a past surgical history that includes Appendectomy; repairs of bilateral ankle fractures; Spinal fusion (N/A); and Spinal cord stimulator implant (N/A). Family: family history includes Diabetes in his maternal grandmother; Heart disease in his  father.  Laboratory Chemistry Profile   Renal Lab Results  Component Value Date   BUN 22 04/27/2020   CREATININE 0.87 04/27/2020   BCR 27 (H) 12/12/2016   GFR 95.16 04/27/2020   GFRAA 120 12/12/2016   GFRNONAA 104 12/12/2016     Hepatic Lab Results  Component Value Date   AST 20 04/27/2020   ALT 18 04/27/2020   ALBUMIN 4.7 04/27/2020   ALKPHOS 69 04/27/2020   HCVAB NEGATIVE 12/30/2014     Electrolytes Lab Results  Component Value Date   NA 137 04/27/2020   K 3.9 04/27/2020   CL 102 04/27/2020   CALCIUM 10.3 04/27/2020   MG 1.9 12/12/2016     Bone Lab Results  Component Value Date   25OHVITD1 44 12/12/2016   25OHVITD2 <1.0 12/12/2016   25OHVITD3 43 12/12/2016     Inflammation (CRP: Acute Phase) (ESR: Chronic Phase) Lab Results  Component Value Date   CRP 2.5 12/12/2016   ESRSEDRATE 10 12/12/2016       Note: Above Lab results reviewed.  Recent Imaging Review  DG PAIN CLINIC C-ARM 1-60 MIN NO REPORT Fluoro was used, but no Radiologist interpretation will be provided.  Please refer to "NOTES" tab for provider progress note. Note: Reviewed        Physical Exam  General appearance: Well nourished, well developed, and well hydrated. In no apparent acute distress Mental status: Alert, oriented x 3 (person, place, & time)       Respiratory: No evidence of acute respiratory distress Eyes: PERLA Vitals: BP 130/70   Pulse 64   Temp 98.4 F (36.9 C) (Oral)   Resp 16   Ht 5' 10"  (1.778 m)   Wt 230 lb (104.3 kg)   SpO2 99%   BMI 33.00 kg/m  BMI: Estimated body mass index is 33 kg/m as calculated from the following:   Height as of this encounter: 5' 10"  (1.778 m).   Weight as of this encounter: 230 lb (104.3 kg). Ideal: Ideal body weight: 73 kg (160 lb 15 oz) Adjusted ideal body weight: 85.5 kg (188 lb 9 oz)  Assessment   Status Diagnosis  Controlled Controlled Controlled 1. Chronic pain syndrome   2. Chronic low back pain (1ry area of Pain)  (Bilateral) (L>R)   3. Chronic lower extremity pain (2ry area of Pain) (Left)   4. Failed back surgical syndrome (Fusion L4-S1)   5. Retrolisthesis (4-5 mm) (L3 over L4) (L2 over L3   6. Pharmacologic therapy   7. Chronic use of opiate for therapeutic purpose   8. Uncomplicated opioid dependence ([place])      Updated Problems: Problem  Chronic Use of Opiate for Therapeutic Purpose    Plan of Care  Problem-specific:  No problem-specific Assessment & Plan notes found for this encounter.  Lance Roman  has a current medication list which includes the following long-term medication(s): baclofen, celecoxib, esomeprazole, gabapentin, oxycodone, [START ON 09/09/2020] oxycodone, [START ON 10/09/2020] oxycodone, oxycodone, [START ON 09/09/2020] oxycodone, and [START ON 10/09/2020] oxycodone.  Pharmacotherapy (Medications Ordered): Meds ordered this encounter  Medications  . oxyCODONE (OXY IR/ROXICODONE) 5 MG immediate release tablet    Sig: Take 1 tablet (5 mg total) by mouth every 6 (six) hours as needed for severe pain. Must last 30 days.    Dispense:  120 tablet    Refill:  0    Not a duplicate. Do NOT delete! Dispense 1 day early if closed on refill date. Avoid benzodiazepines within 8 hours of opioids. Do not send refill requests.  Marland Kitchen oxyCODONE (OXYCONTIN) 10 mg 12 hr tablet    Sig: Take 1 tablet (10 mg total) by mouth every 12 (twelve) hours. Must last 30 days.    Dispense:  60 tablet    Refill:  0    Not a duplicate. Do NOT delete! Dispense 1 day early if closed on refill date. Avoid benzodiazepines within 8 hours of opioids. Do not send refill requests.  Marland Kitchen oxyCODONE (OXYCONTIN) 10 mg 12 hr tablet    Sig: Take 1 tablet (10 mg total) by mouth every 12 (twelve) hours. Must last 30 days.    Dispense:  60 tablet    Refill:  0    Not a duplicate. Do NOT delete! Dispense 1 day early if closed on refill date. Avoid benzodiazepines within 8 hours of opioids. Do not send refill requests.  Marland Kitchen  oxyCODONE (OXYCONTIN) 10 mg 12 hr tablet    Sig: Take 1 tablet (10 mg total) by mouth every 12 (twelve) hours. Must last 30 days.    Dispense:  60 tablet    Refill:  0    Not a duplicate. Do NOT delete! Dispense 1 day early if closed on refill date. Avoid benzodiazepines within 8 hours of opioids. Do not send refill requests.  Marland Kitchen oxyCODONE (OXY IR/ROXICODONE) 5 MG immediate release tablet    Sig: Take 1 tablet (5 mg total) by mouth every 6 (six) hours as needed for severe pain. Must last 30 days.    Dispense:  120 tablet    Refill:  0    Not a duplicate. Do NOT delete! Dispense 1 day early if closed on refill date. Avoid benzodiazepines within 8 hours of opioids. Do not send refill requests.  Marland Kitchen oxyCODONE (OXY IR/ROXICODONE) 5 MG immediate release tablet    Sig: Take 1 tablet (5 mg total) by mouth every 6 (six) hours as needed for severe pain. Must last 30 days.    Dispense:  120 tablet    Refill:  0    Not a duplicate. Do NOT delete! Dispense 1 day early if closed on refill date. Avoid benzodiazepines within 8 hours of opioids. Do not send refill requests.   Orders:  Orders Placed This Encounter  Procedures  . [employer] Select 13 (MW), Urine    Volume: 30 ml(s). Minimum 3 ml of urine is needed. Document temperature of fresh sample. Indications: Long term (current) use of opiate analgesic (Z30.865)    Order Specific Question:   Release to patient    Answer:   Immediate   Follow-up plan:   Return in about 3 months (around 11/08/2020) for evaluation day, (F2F), (MM).      Interventional Therapies  Risk  Complexity Considerations:   Estimated body mass index is 33 kg/m as calculated from the  following:   Height as of this encounter: 5' 10"  (1.778 m).   Weight as of this encounter: 230 lb (104.3 kg). WNL   Planned  Pending:   Pending further evaluation   Under consideration:   Possible intrathecal injection of diagnostic substance (opioid analgesic) as an intrathecal pump trial.   Should the patient be a good candidate for intrathecal pump, but then we could attempt removing the fractured leads from his nonfunctional spinal cord stimulator.  This would allow the patient to have MRIs in the future.  Possible Racz procedure #1   Completed:   Palliative bilateral lumbar facet block x2 (08/17/2017)  Palliativeright lumbar facetRFAx1(01/23/18)  Palliativeleft lumbar facetRFAx1(11/21/17)  Therapeutic left caudal ESI x1 (05/14/2019) (100/100/50)  Therapeutic bilateral erector spinae muscle and quadratus lumborum muscle trigger point x2 (04/10/2018)    Therapeutic  Palliative (PRN) options:   Palliative bilateral lumbar facet block #3  Palliativeright lumbar facetRFA#2 Palliativeleft lumbar facetRFA#2 Therapeutic left caudal ESI #2     Recent Visits No visits were found meeting these conditions. Showing recent visits within past 90 days and meeting all other requirements Today's Visits Date Type Provider Dept  08/10/20 Office Visit Milinda Pointer, MD [facility]  Showing today's visits and meeting all other requirements Future Appointments No visits were found meeting these conditions. Showing future appointments within next 90 days and meeting all other requirements  I discussed the assessment and treatment plan with the patient. The patient was provided an opportunity to ask questions and all were answered. The patient agreed with the plan and demonstrated an understanding of the instructions.  Patient advised to call back or seek an in-person evaluation if the symptoms or condition worsens.  Duration of encounter: 30 minutes.  Note by: Gaspar Cola, MD Date: 08/10/2020; Time: 8:16 AM

## 2020-08-10 ENCOUNTER — Ambulatory Visit: Payer: BC Managed Care – PPO | Attending: Pain Medicine | Admitting: Pain Medicine

## 2020-08-10 ENCOUNTER — Other Ambulatory Visit: Payer: Self-pay

## 2020-08-10 ENCOUNTER — Encounter: Payer: Self-pay | Admitting: Pain Medicine

## 2020-08-10 VITALS — BP 130/70 | HR 64 | Temp 98.4°F | Resp 16 | Ht 70.0 in | Wt 230.0 lb

## 2020-08-10 DIAGNOSIS — Z79899 Other long term (current) drug therapy: Secondary | ICD-10-CM | POA: Diagnosis not present

## 2020-08-10 DIAGNOSIS — M5442 Lumbago with sciatica, left side: Secondary | ICD-10-CM | POA: Diagnosis not present

## 2020-08-10 DIAGNOSIS — M79605 Pain in left leg: Secondary | ICD-10-CM | POA: Diagnosis not present

## 2020-08-10 DIAGNOSIS — Z79891 Long term (current) use of opiate analgesic: Secondary | ICD-10-CM | POA: Insufficient documentation

## 2020-08-10 DIAGNOSIS — F1120 Opioid dependence, uncomplicated: Secondary | ICD-10-CM | POA: Diagnosis not present

## 2020-08-10 DIAGNOSIS — G894 Chronic pain syndrome: Secondary | ICD-10-CM | POA: Insufficient documentation

## 2020-08-10 DIAGNOSIS — G8929 Other chronic pain: Secondary | ICD-10-CM | POA: Diagnosis not present

## 2020-08-10 DIAGNOSIS — M961 Postlaminectomy syndrome, not elsewhere classified: Secondary | ICD-10-CM

## 2020-08-10 DIAGNOSIS — M4310 Spondylolisthesis, site unspecified: Secondary | ICD-10-CM | POA: Insufficient documentation

## 2020-08-10 MED ORDER — OXYCODONE HCL ER 10 MG PO T12A
10.0000 mg | EXTENDED_RELEASE_TABLET | Freq: Two times a day (BID) | ORAL | 0 refills | Status: DC
Start: 2020-08-10 — End: 2020-11-04

## 2020-08-10 MED ORDER — OXYCODONE HCL 5 MG PO TABS: 5.0000 mg | ORAL_TABLET | Freq: Four times a day (QID) | ORAL | 0 refills | Status: DC | PRN

## 2020-08-10 MED ORDER — OXYCODONE HCL ER 10 MG PO T12A: 10.0000 mg | EXTENDED_RELEASE_TABLET | Freq: Two times a day (BID) | ORAL | 0 refills | Status: DC

## 2020-08-10 MED ORDER — OXYCODONE HCL 5 MG PO TABS
5.0000 mg | ORAL_TABLET | Freq: Four times a day (QID) | ORAL | 0 refills | Status: DC | PRN
Start: 2020-08-10 — End: 2020-11-04

## 2020-08-10 MED ORDER — OXYCODONE HCL ER 10 MG PO T12A
10.0000 mg | EXTENDED_RELEASE_TABLET | Freq: Two times a day (BID) | ORAL | 0 refills | Status: DC
Start: 2020-09-09 — End: 2020-11-04

## 2020-08-10 MED ORDER — OXYCODONE HCL 5 MG PO TABS
5.0000 mg | ORAL_TABLET | Freq: Four times a day (QID) | ORAL | 0 refills | Status: DC | PRN
Start: 2020-09-09 — End: 2020-11-04

## 2020-08-10 NOTE — Progress Notes (Signed)
Nursing Pain Medication Assessment:  Safety precautions to be maintained throughout the outpatient stay will include: orient to surroundings, keep bed in low position, maintain call bell within reach at all times, provide assistance with transfer out of bed and ambulation.  Medication Inspection Compliance: Pill count conducted under aseptic conditions, in front of the patient. Neither the pills nor the bottle was removed from the patient's sight at any time. Once count was completed pills were immediately returned to the patient in their original bottle.  Medication #1: Oxycodone ER (OxyContin) Pill/Patch Count: 0 of 60 pills remain Pill/Patch Appearance: Markings consistent with prescribed medication Bottle Appearance: Standard pharmacy container. Clearly labeled. Filled Date: 04 / 16 / 2022 Last Medication intake:  Yesterday  Medication #2: Oxycodone IR Pill/Patch Count: 0 of 120 patches remain Pill/Patch Appearance: Markings consistent with prescribed medication Bottle Appearance: Standard pharmacy container. Clearly labeled. Filled Date: 04 / 16 / 2022 Last Medication intake:  Yesterday

## 2020-08-10 NOTE — Patient Instructions (Signed)
____________________________________________________________________________________________  Drug Holidays (Slow)  What is a "Drug Holiday"? Drug Holiday: is the name given to the period of time during which a patient stops taking a medication(s) for the purpose of eliminating tolerance to the drug.  Benefits . Improved effectiveness of opioids. . Decreased opioid dose needed to achieve benefits. . Improved pain with lesser dose.  What is tolerance? Tolerance: is the progressive decreased in effectiveness of a drug due to its repetitive use. With repetitive use, the body gets use to the medication and as a consequence, it loses its effectiveness. This is a common problem seen with opioid pain medications. As a result, a larger dose of the drug is needed to achieve the same effect that used to be obtained with a smaller dose.  How long should a "Drug Holiday" last? You should stay off of the pain medicine for at least 14 consecutive days. (2 weeks)  Should I stop the medicine "cold turkey"? No. You should always coordinate with your Pain Specialist so that he/she can provide you with the correct medication dose to make the transition as smoothly as possible.  How do I stop the medicine? Slowly. You will be instructed to decrease the daily amount of pills that you take by one (1) pill every seven (7) days. This is called a "slow downward taper" of your dose. For example: if you normally take four (4) pills per day, you will be asked to drop this dose to three (3) pills per day for seven (7) days, then to two (2) pills per day for seven (7) days, then to one (1) per day for seven (7) days, and at the end of those last seven (7) days, this is when the "Drug Holiday" would start.   Will I have withdrawals? By doing a "slow downward taper" like this one, it is unlikely that you will experience any significant withdrawal symptoms. Typically, what triggers withdrawals is the sudden stop of a high  dose opioid therapy. Withdrawals can usually be avoided by slowly decreasing the dose over a prolonged period of time. If you do not follow these instructions and decide to stop your medication abruptly, withdrawals may be possible.  What are withdrawals? Withdrawals: refers to the wide range of symptoms that occur after stopping or dramatically reducing opiate drugs after heavy and prolonged use. Withdrawal symptoms do not occur to patients that use low dose opioids, or those who take the medication sporadically. Contrary to benzodiazepine (example: Valium, Xanax, etc.) or alcohol withdrawals ("Delirium Tremens"), opioid withdrawals are not lethal. Withdrawals are the physical manifestation of the body getting rid of the excess receptors.  Expected Symptoms Early symptoms of withdrawal may include: . Agitation . Anxiety . Muscle aches . Increased tearing . Insomnia . Runny nose . Sweating . Yawning  Late symptoms of withdrawal may include: . Abdominal cramping . Diarrhea . Dilated pupils . Goose bumps . Nausea . Vomiting  Will I experience withdrawals? Due to the slow nature of the taper, it is very unlikely that you will experience any.  What is a slow taper? Taper: refers to the gradual decrease in dose.  (Last update: 10/16/2019) ____________________________________________________________________________________________    ____________________________________________________________________________________________  Medication Recommendations and Reminders  Applies to: All patients receiving prescriptions (written and/or electronic).  Medication Rules & Regulations: These rules and regulations exist for your safety and that of others. They are not flexible and neither are we. Dismissing or ignoring them will be considered "non-compliance" with medication therapy, resulting in complete   and irreversible termination of such therapy. (See document titled "Medication Rules" for  more details.) In all conscience, because of safety reasons, we cannot continue providing a therapy where the patient does not follow instructions.  Pharmacy of record:   Definition: This is the pharmacy where your electronic prescriptions will be sent.   We do not endorse any particular pharmacy, however, we have experienced problems with [facility] not securing enough medication supply for the community.  We do not restrict you in your choice of pharmacy. However, once we write for your prescriptions, we will NOT be re-sending more prescriptions to fix restricted supply problems created by your pharmacy, or your insurance.   The pharmacy listed in the electronic medical record should be the one where you want electronic prescriptions to be sent.  If you choose to change pharmacy, simply notify our nursing staff.  Recommendations:  Keep all of your pain medications in a safe place, under lock and key, even if you live alone. We will NOT replace lost, stolen, or damaged medication.  After you fill your prescription, take 1 week's worth of pills and put them away in a safe place. You should keep a separate, properly labeled bottle for this purpose. The remainder should be kept in the original bottle. Use this as your primary supply, until it runs out. Once it's gone, then you know that you have 1 week's worth of medicine, and it is time to come in for a prescription refill. If you do this correctly, it is unlikely that you will ever run out of medicine.  To make sure that the above recommendation works, it is very important that you make sure your medication refill appointments are scheduled at least 1 week before you run out of medicine. To do this in an effective manner, make sure that you do not leave the office without scheduling your next medication management appointment. Always ask the nursing staff to show you in your prescription , when your medication will be running out. Then arrange for  the receptionist to get you a return appointment, at least 7 days before you run out of medicine. Do not wait until you have 1 or 2 pills left, to come in. This is very poor planning and does not take into consideration that we may need to cancel appointments due to bad weather, sickness, or emergencies affecting our staff.  DO NOT ACCEPT A "Partial Fill": If for any reason your pharmacy does not have enough pills/tablets to completely fill or refill your prescription, do not allow for a "partial fill". The law allows the pharmacy to complete that prescription within 72 hours, without requiring a new prescription. If they do not fill the rest of your prescription within those 72 hours, you will need a separate prescription to fill the remaining amount, which we will NOT provide. If the reason for the partial fill is your insurance, you will need to talk to the pharmacist about payment alternatives for the remaining tablets, but again, DO NOT ACCEPT A PARTIAL FILL, unless you can trust your pharmacist to obtain the remainder of the pills within 72 hours.  Prescription refills and/or changes in medication(s):   Prescription refills, and/or changes in dose or medication, will be conducted only during scheduled medication management appointments. (Applies to both, written and electronic prescriptions.)  No refills on procedure days. No medication will be changed or started on procedure days. No changes, adjustments, and/or refills will be conducted on a procedure day. Doing so   will interfere with the diagnostic portion of the procedure.  No phone refills. No medications will be "called into the pharmacy".  No Fax refills.  No weekend refills.  No Holliday refills.  No after hours refills.  Remember:  Business hours are:  Monday to Thursday 8:00 AM to 4:00 PM Provider's Schedule: Shannan Slinker, MD - Appointments are:  Medication management: Monday and Wednesday 8:00 AM to 4:00 PM Procedure  day: Tuesday and Thursday 7:30 AM to 4:00 PM Bilal Lateef, MD - Appointments are:  Medication management: Tuesday and Thursday 8:00 AM to 4:00 PM Procedure day: Monday and Wednesday 7:30 AM to 4:00 PM (Last update: 10/16/2019) ____________________________________________________________________________________________   ____________________________________________________________________________________________  CBD (cannabidiol) WARNING  Applicable to: All individuals currently taking or considering taking CBD (cannabidiol) and, more important, all patients taking opioid analgesic controlled substances (pain medication). (Example: oxycodone; oxymorphone; hydrocodone; hydromorphone; morphine; methadone; tramadol; tapentadol; fentanyl; buprenorphine; butorphanol; dextromethorphan; meperidine; codeine; etc.)  Legal status: CBD remains a Schedule I drug prohibited for any use. CBD is illegal with one exception. In the [place], CBD has a limited Food and Drug Administration (FDA) approval for the treatment of two specific types of epilepsy disorders. Only one CBD product has been approved by the FDA for this purpose: "Epidiolex". FDA is aware that some companies are marketing products containing cannabis and cannabis-derived compounds in ways that violate the Federal Food, Drug and Cosmetic Act (FD&C Act) and that may put the health and safety of consumers at risk. The FDA, a Federal agency, has not enforced the CBD status since 2018.   Legality: Some manufacturers ship CBD products nationally, which is illegal. Often such products are sold online and are therefore available throughout the country. CBD is openly sold in head shops and health food stores in some states where such sales have not been explicitly legalized. Selling unapproved products with unsubstantiated therapeutic claims is not only a violation of the law, but also can put patients at risk, as these products have not been proven to  be safe or effective. Federal illegality makes it difficult to conduct research on CBD.  Reference: "FDA Regulation of Cannabis and Cannabis-Derived Products, Including Cannabidiol (CBD)" - https://www.fda.gov/news-events/public-health-focus/fda-regulation-cannabis-and-cannabis-derived-products-including-cannabidiol-cbd  Warning: CBD is not FDA approved and has not undergo the same manufacturing controls as prescription drugs.  This means that the purity and safety of available CBD may be questionable. Most of the time, despite manufacturer's claims, it is contaminated with THC (delta-9-tetrahydrocannabinol - the chemical in marijuana responsible for the "HIGH").  When this is the case, the THC contaminant will trigger a positive urine drug screen (UDS) test for Marijuana (carboxy-THC). Because a positive UDS for any illicit substance is a violation of our medication agreement, your opioid analgesics (pain medicine) may be permanently discontinued.  MORE ABOUT CBD  General Information: CBD  is a derivative of the Marijuana (cannabis sativa) plant discovered in 1940. It is one of the 113 identified substances found in Marijuana. It accounts for up to 40% of the plant's extract. As of 2018, preliminary clinical studies on CBD included research for the treatment of anxiety, movement disorders, and pain. CBD is available and consumed in multiple forms, including inhalation of smoke or vapor, as an aerosol spray, and by mouth. It may be supplied as an oil containing CBD, capsules, dried cannabis, or as a liquid solution. CBD is thought not to be as psychoactive as THC (delta-9-tetrahydrocannabinol - the chemical in marijuana responsible for the "HIGH"). Studies suggest that CBD may interact   with different biological target receptors in the body, including cannabinoid and other neurotransmitter receptors. As of 2018 the mechanism of action for its biological effects has not been determined.  Side-effects   Adverse reactions: Dry mouth, diarrhea, decreased appetite, fatigue, drowsiness, malaise, weakness, sleep disturbances, and others.  Drug interactions: CBC may interact with other medications such as blood-thinners. (Last update: 11/02/2019) ____________________________________________________________________________________________   ____________________________________________________________________________________________  Medication Rules  Purpose: To inform patients, and their family members, of our rules and regulations.  Applies to: All patients receiving prescriptions (written or electronic).  Pharmacy of record: Pharmacy where electronic prescriptions will be sent. If written prescriptions are taken to a different pharmacy, please inform the nursing staff. The pharmacy listed in the electronic medical record should be the one where you would like electronic prescriptions to be sent.  Electronic prescriptions: In compliance with the [place] Strengthen Opioid Misuse Prevention (STOP) Act of 2017 (Session Law 2017-74/H243), effective March 28, 2018, all controlled substances must be electronically prescribed. Calling prescriptions to the pharmacy will cease to exist.  Prescription refills: Only during scheduled appointments. Applies to all prescriptions.  NOTE: The following applies primarily to controlled substances (Opioid* Pain Medications).   Type of encounter (visit): For patients receiving controlled substances, face-to-face visits are required. (Not an option or up to the patient.)  Patient's responsibilities: 1. Pain Pills: Bring all pain pills to every appointment (except for procedure appointments). 2. Pill Bottles: Bring pills in original pharmacy bottle. Always bring the newest bottle. Bring bottle, even if empty. 3. Medication refills: You are responsible for knowing and keeping track of what medications you take and those you need refilled. The day before  your appointment: write a list of all prescriptions that need to be refilled. The day of the appointment: give the list to the admitting nurse. Prescriptions will be written only during appointments. No prescriptions will be written on procedure days. If you forget a medication: it will not be "Called in", "Faxed", or "electronically sent". You will need to get another appointment to get these prescribed. No early refills. Do not call asking to have your prescription filled early. 4. Prescription Accuracy: You are responsible for carefully inspecting your prescriptions before leaving our office. Have the discharge nurse carefully go over each prescription with you, before taking them home. Make sure that your name is accurately spelled, that your address is correct. Check the name and dose of your medication to make sure it is accurate. Check the number of pills, and the written instructions to make sure they are clear and accurate. Make sure that you are given enough medication to last until your next medication refill appointment. 5. Taking Medication: Take medication as prescribed. When it comes to controlled substances, taking less pills or less frequently than prescribed is permitted and encouraged. Never take more pills than instructed. Never take medication more frequently than prescribed.  6. Inform other Doctors: Always inform, all of your healthcare providers, of all the medications you take. 7. Pain Medication from other Providers: You are not allowed to accept any additional pain medication from any other Doctor or Healthcare provider. There are two exceptions to this rule. (see below) In the event that you require additional pain medication, you are responsible for notifying us, as stated below. 8. Cough Medicine: Often these contain an opioid, such as codeine or hydrocodone. Never accept or take cough medicine containing these opioids if you are already taking an opioid* medication. The  combination may cause respiratory failure and   death. 9. Medication Agreement: You are responsible for carefully reading and following our Medication Agreement. This must be signed before receiving any prescriptions from our practice. Safely store a copy of your signed Agreement. Violations to the Agreement will result in no further prescriptions. (Additional copies of our Medication Agreement are available upon request.) 10. Laws, Rules, & Regulations: All patients are expected to follow all Federal and State Laws, Statutes, Rules, & Regulations. Ignorance of the Laws does not constitute a valid excuse.  11. Illegal drugs and Controlled Substances: The use of illegal substances (including, but not limited to marijuana and its derivatives) and/or the illegal use of any controlled substances is strictly prohibited. Violation of this rule may result in the immediate and permanent discontinuation of any and all prescriptions being written by our practice. The use of any illegal substances is prohibited. 12. Adopted CDC guidelines & recommendations: Target dosing levels will be at or below 60 MME/day. Use of benzodiazepines** is not recommended.  Exceptions: There are only two exceptions to the rule of not receiving pain medications from other Healthcare Providers. 1. Exception #1 (Emergencies): In the event of an emergency (i.e.: accident requiring emergency care), you are allowed to receive additional pain medication. However, you are responsible for: As soon as you are able, call our office [phone], at any time of the day or night, and leave a message stating your name, the date and nature of the emergency, and the name and dose of the medication prescribed. In the event that your call is answered by a member of our staff, make sure to document and save the date, time, and the name of the person that took your information.  2. Exception #2 (Planned Surgery): In the event that you are scheduled by  another doctor or dentist to have any type of surgery or procedure, you are allowed (for a period no longer than 30 days), to receive additional pain medication, for the acute post-op pain. However, in this case, you are responsible for picking up a copy of our "Post-op Pain Management for Surgeons" handout, and giving it to your surgeon or dentist. This document is available at our office, and does not require an appointment to obtain it. Simply go to our office during business hours (Monday-Thursday from 8:00 AM to 4:00 PM) (Friday 8:00 AM to 12:00 Noon) or if you have a scheduled appointment with us, prior to your surgery, and ask for it by name. In addition, you are responsible for: calling our office [phone], at any time of the day or night, and leaving a message stating your name, name of your surgeon, type of surgery, and date of procedure or surgery. Failure to comply with your responsibilities may result in termination of therapy involving the controlled substances.  *Opioid medications include: morphine, codeine, oxycodone, oxymorphone, hydrocodone, hydromorphone, meperidine, tramadol, tapentadol, buprenorphine, fentanyl, methadone. **Benzodiazepine medications include: diazepam (Valium), alprazolam (Xanax), clonazepam (Klonopine), lorazepam (Ativan), clorazepate (Tranxene), chlordiazepoxide (Librium), estazolam (Prosom), oxazepam (Serax), temazepam (Restoril), triazolam (Halcion) (Last updated: 02/24/2020) ____________________________________________________________________________________________    

## 2020-08-15 LAB — TOXASSURE SELECT 13 (MW), URINE

## 2020-09-03 ENCOUNTER — Ambulatory Visit (HOSPITAL_COMMUNITY): Payer: BC Managed Care – PPO

## 2020-09-03 ENCOUNTER — Other Ambulatory Visit: Payer: Self-pay

## 2020-09-03 ENCOUNTER — Encounter (HOSPITAL_COMMUNITY): Payer: Self-pay

## 2020-09-03 ENCOUNTER — Ambulatory Visit (HOSPITAL_COMMUNITY)
Admission: EM | Admit: 2020-09-03 | Discharge: 2020-09-03 | Disposition: A | Discharge status: Home / Self Care | Payer: BC Managed Care – PPO | Attending: Physician Assistant | Admitting: Physician Assistant

## 2020-09-03 DIAGNOSIS — M109 Gout, unspecified: Secondary | ICD-10-CM | POA: Diagnosis not present

## 2020-09-03 DIAGNOSIS — M79675 Pain in left toe(s): Secondary | ICD-10-CM

## 2020-09-03 MED ORDER — PREDNISONE 10 MG (21) PO TBPK: ORAL_TABLET | ORAL | 0 refills | Status: DC

## 2020-09-03 NOTE — ED Triage Notes (Signed)
Pt c/o gout in left foot. Pt reports pain started yesterday. States has not had flare up in 5-6 years. Redness and inflammation noted to base of great toe.

## 2020-09-03 NOTE — Discharge Instructions (Signed)
Take prednisone taper as directed.  While you are taking this you should not take NSAIDs including aspirin, ibuprofen/Advil, naproxen/Aleve, Celebrex as this can cause stomach bleeding.  Keep foot elevated and try to avoid ambulation.  If symptoms recur it may be worthwhile to follow-up with your primary care to determine if starting a uric acid lowering medication may be helpful.  If anything worsens any develop numbness, tingling, fever, nausea, vomiting, inability to walk please return for reevaluation.

## 2020-09-03 NOTE — ED Provider Notes (Signed)
[facility]    CSN: [id number] Arrival date & time: 09/03/20  1147      History   Chief Complaint Chief Complaint  Patient presents with   Foot Pain    HPI Lance Roman is a 60 y.o. male.   Patient presents today with a several day history of worsening left great toe pain.  He has a history of gout and states current symptoms are similar to previous episodes of this condition.  He is not currently taking any uric acid lowering medications as he has been without a flare for 5 to 6 years.  He reports pain is rated 8 on a 0-10 pain scale, localized to left great MTP, described as burning with periodic sharp pains, worse with attempted ambulation, no alleviating factors identified.  He has tried prescription pain medication without improvement of symptoms.  He denies any recent medication changes, dietary changes, increased alcohol consumption.  He denies any weakness, numbness, paresthesias, nausea, vomiting, fever.  He denies history of diabetes.   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   Chronic musculoskeletal pain 04/10/2018   Chronic upper back pain 03/15/2018   Trigger point with back pain 02/07/2018   Neurogenic pain 09/11/2017   Spondylosis without myelopathy or radiculopathy, lumbosacral region 08/09/2017   Other specified dorsopathies, sacral and sacrococcygeal region 08/09/2017   Chronic hip pain (Left) 08/09/2017   Chronic knee pain (Left) 08/09/2017   DDD  (degenerative disc disease), lumbosacral 08/09/2017   Retrolisthesis (4-5 mm) (L3 over L4) (L2 over L3 01/09/2017   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   Lumbar facet hypertrophy (Bilateral) 01/09/2017   Lumbar facet syndrome (Bilateral) (L>R) 01/09/2017   Spinal cord stimulator dysfunction (fractured leads) 01/09/2017   Lumbar facet osteoarthritis (Bilateral) 01/09/2017   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/09/2017   Disorder of skeletal system 12/12/2016   Problems influencing health status 12/12/2016   Opiate use (60 MME/day) 12/12/2016   Long term prescription opiate use 12/12/2016   Chronic pain syndrome 12/12/2016   Muscle cramps 12/12/2016   Long-term current use of opiate analgesic 12/12/2016   Chronic lower extremity pain (2ry area of Pain) (Left) 12/12/2016   Chronic lumbar radicular pain (L5) (Left) 12/12/2016   Pharmacologic therapy 12/30/2014   GERD 03/27/2007   Chronic low back pain (1ry area of Pain) (Bilateral) (L>R) 03/27/2007    Past Surgical History:  Procedure Laterality Date   APPENDECTOMY     repairs of bilateral ankle fractures     SPINAL CORD STIMULATOR IMPLANT N/A    SPINAL FUSION N/A        Home Medications    Prior to Admission medications   Medication Sig Start Date End Date Taking? Authorizing Provider  baclofen (LIORESAL) 10 MG tablet Take 1 tablet (10 mg total) by mouth 3 (three) times daily AND 2 tablets (20 mg total) at bedtime. 04/28/20 09/03/20 Yes Myrlene Broker, MD  celecoxib (CELEBREX) 200 MG capsule Take 1  capsule (200 mg total) by mouth daily. 04/28/20  Yes Myrlene Broker, MD  esomeprazole (NEXIUM) 40 MG capsule Take 1 capsule (40 mg total) by mouth at bedtime. 02/05/20 09/03/20 Yes Delano Metz, MD  gabapentin (NEURONTIN) 600 MG tablet Take 2 tablets (1,200 mg total) by mouth at bedtime. 04/28/20  Yes Myrlene Broker, MD  ibuprofen (ADVIL,MOTRIN) 200 MG tablet Take 400 mg by mouth every 6 (six) hours  as needed for moderate pain.    Yes [provider]  oxyCODONE (OXY IR/ROXICODONE) 5 MG immediate release tablet Take 1 tablet (5 mg total) by mouth every 6 (six) hours as needed for severe pain. Must last 30 days. 08/10/20 09/09/20 Yes Delano Metz, MD  oxyCODONE (OXYCONTIN) 10 mg 12 hr tablet Take 1 tablet (10 mg total) by mouth every 12 (twelve) hours. Must last 30 days. 08/10/20 09/09/20 Yes Delano Metz, MD  predniSONE (STERAPRED UNI-PAK 21 TAB) 10 MG (21) TBPK tablet As directed. 09/03/20  Yes Kirby Argueta, Noberto Retort, PA-C  naloxone (NARCAN) 2 MG/2ML injection Inject 1 mL (1 mg total) into the muscle as needed for up to 2 doses (for opioid overdose). In case of emergency (overdose), inject into muscle of upper arm or leg and call 911. 02/05/20   Delano Metz, MD  oxyCODONE (OXY IR/ROXICODONE) 5 MG immediate release tablet Take 1 tablet (5 mg total) by mouth every 6 (six) hours as needed for severe pain. Must last 30 days. 09/09/20 10/09/20  Delano Metz, MD  oxyCODONE (OXY IR/ROXICODONE) 5 MG immediate release tablet Take 1 tablet (5 mg total) by mouth every 6 (six) hours as needed for severe pain. Must last 30 days. 10/09/20 11/08/20  Delano Metz, MD  oxyCODONE (OXYCONTIN) 10 mg 12 hr tablet Take 1 tablet (10 mg total) by mouth every 12 (twelve) hours. Must last 30 days. 09/09/20 10/09/20  Delano Metz, MD  oxyCODONE (OXYCONTIN) 10 mg 12 hr tablet Take 1 tablet (10 mg total) by mouth every 12 (twelve) hours. Must last 30 days. 10/09/20 11/08/20  Delano Metz, MD    Family History Family History  Problem Relation Age of Onset   Heart disease Father    Diabetes Maternal Grandmother     Social History Social History   Tobacco Use   Smoking status: Former    Pack years: 0.00   Smokeless tobacco: Current    Types: Chew  Vaping Use   Vaping Use: Never used  Substance Use Topics   Alcohol use: Yes    Alcohol/week: 3.0 standard drinks    Types: 3 Cans of  beer per week    Comment: occasional   Drug use: No     Allergies   Patient has no known allergies.   Review of Systems Review of Systems  Constitutional:  Positive for activity change. Negative for appetite change, fatigue and fever.  Respiratory:  Negative for cough and shortness of breath.   Cardiovascular:  Negative for chest pain.  Gastrointestinal:  Negative for abdominal pain, diarrhea, nausea and vomiting.  Musculoskeletal:  Positive for arthralgias, gait problem and joint swelling. Negative for myalgias.  Neurological:  Negative for dizziness, weakness, light-headedness, numbness and headaches.    Physical Exam Triage Vital Signs ED Triage Vitals  Enc Vitals Group     BP 09/03/20 1318 124/73     Pulse Rate 09/03/20 1318 (!) 59     Resp 09/03/20 1318 18     Temp 09/03/20 1318 98.3 F (36.8 C)     Temp  Source 09/03/20 1318 Oral     SpO2 09/03/20 1318 99 %     Weight --      Height --      Head Circumference --      Peak Flow --      Pain Score 09/03/20 1313 8     Pain Loc --      Pain Edu? --      Excl. in GC? --    No data found.  Updated Vital Signs BP 124/73   Pulse (!) 59   Temp 98.3 F (36.8 C) (Oral)   Resp 18   SpO2 99%   Visual Acuity Right Eye Distance:   Left Eye Distance:   Bilateral Distance:    Right Eye Near:   Left Eye Near:    Bilateral Near:     Physical Exam Vitals reviewed.  Constitutional:      General: He is awake.     Appearance: Normal appearance. He is normal weight. He is not ill-appearing.     Comments: Very pleasant male appears stated age in no acute distress  HENT:     Head: Normocephalic and atraumatic.  Cardiovascular:     Rate and Rhythm: Normal rate and regular rhythm.     Heart sounds: Normal heart sounds, S1 normal and S2 normal. No murmur heard. Pulmonary:     Effort: Pulmonary effort is normal.     Breath sounds: Normal breath sounds. No stridor. No wheezing, rhonchi or rales.     Comments: Clear to  auscultation bilaterally Abdominal:     Palpations: Abdomen is soft.     Tenderness: There is no abdominal tenderness. There is no right CVA tenderness, left CVA tenderness, guarding or rebound.  Musculoskeletal:     Left foot: Decreased range of motion. Normal capillary refill. Swelling and tenderness present. No bony tenderness.  Feet:     Left foot:     Protective Sensation: 10 sites tested.  10 sites sensed.     Skin integrity: Erythema and warmth present.     Toenail Condition: Left toenails are normal.     Comments: Left foot: Foot neurovascularly intact.  Erythema and warmth noted and left great MTP.  No deformity noted.  No bony tenderness on exam. Neurological:     Mental Status: He is alert.  Psychiatric:        Behavior: Behavior is cooperative.     UC Treatments / Results  Labs (all labs ordered are listed, but only abnormal results are displayed) Labs Reviewed - No data to display  EKG   Radiology No results found.  Procedures Procedures (including critical care time)  Medications Ordered in UC Medications - No data to display  Initial Impression / Assessment and Plan / UC Course  I have reviewed the triage vital signs and the nursing notes.  Pertinent labs & imaging results that were available during my care of the patient were reviewed by me and considered in my medical decision making (see chart for details).     Clinical presentation consistent with gout.  Patient has tried and failed NSAIDs.  We will try prednisone taper to manage symptoms.  He was instructed not to take NSAIDs with this medication due to risk of GI bleeding.  No indication for imaging given no traumatic injury and no bony tenderness on exam.  Discussed that if symptoms persist patient should follow-up with PCP to consider prophylactic medication.  Recommended he avoid alcohol.  Discussed alarm symptoms  or warrant emergent evaluation.  Strict return precautions given to which patient  expressed understanding.   Final Clinical Impressions(s) / UC Diagnoses   Final diagnoses:  Acute gout of left foot, unspecified cause  Great toe pain, left     Discharge Instructions      Take prednisone taper as directed.  While you are taking this you should not take NSAIDs including aspirin, ibuprofen/Advil, naproxen/Aleve, Celebrex as this can cause stomach bleeding.  Keep foot elevated and try to avoid ambulation.  If symptoms recur it may be worthwhile to follow-up with your primary care to determine if starting a uric acid lowering medication may be helpful.  If anything worsens any develop numbness, tingling, fever, nausea, vomiting, inability to walk please return for reevaluation.     ED Prescriptions     Medication Sig Dispense Auth. Provider   predniSONE (STERAPRED UNI-PAK 21 TAB) 10 MG (21) TBPK tablet As directed. 21 tablet Kanani Mowbray, Noberto Retort, PA-C      PDMP not reviewed this encounter.   Jeani Hawking, PA-C 09/03/20 1344

## 2020-11-02 ENCOUNTER — Encounter: Payer: BC Managed Care – PPO | Admitting: Pain Medicine

## 2020-11-02 NOTE — Progress Notes (Signed)
PROVIDER NOTE: Information contained herein reflects review and annotations entered in association with encounter. Interpretation of such information and data should be left to medically-trained personnel. Information provided to patient can be located elsewhere in the medical record under "Patient Instructions". Document created using STT-dictation technology, any transcriptional errors that may result from process are unintentional.    Patient: [name]  Service Category: E/M  Provider: Gaspar Cola, MD  DOB: 1960/07/15  DOS: 11/05/2020  Specialty: Interventional Pain Management  MRN: [id number]  Setting: Ambulatory outpatient  PCP: Hoyt Koch, MD  Type: Established Patient    Referring Provider: Hoyt Koch, *  Location: Office  Delivery: Face-to-face     HPI  Mr. ZAMERE PASTERNAK, a 60 y.o. year old male, is here today because of his Chronic pain syndrome [G89.4]. Mr. Alverio primary complain today is Back Pain Last encounter: My last encounter with him was on 08/10/2020. Pertinent problems: Mr. Fosdick has Chronic low back pain (1ry area of Pain) (Bilateral) (L>R); Chronic pain syndrome; Chronic lower extremity pain (2ry area of Pain) (Left); Chronic lumbar radicular pain (L5) (Left); Retrolisthesis (4-5 mm) (L3 over L4) (L2 over L3; Failed back surgical syndrome (Fusion L4-S1); Lumbar facet hypertrophy (Bilateral); Lumbar facet syndrome (Bilateral) (L>R); Lumbar facet osteoarthritis (Bilateral); Osteoarthritis of lumbar spine; Spondylosis without myelopathy or radiculopathy, lumbosacral region; Other specified dorsopathies, sacral and sacrococcygeal region; Chronic hip pain (Left); Chronic knee pain (Left); DDD (degenerative disc disease), lumbosacral; Neurogenic pain; Trigger point with back pain; Chronic upper back pain; Chronic musculoskeletal pain; and Epidural fibrosis on their pertinent problem list. Pain Assessment: Severity of Chronic pain is reported as a 5  /10. Location: Back Left/pain radiaities down his left leg to bottom of ghis foot. Onset: More than a month ago. Quality: Aching, Burning, Sharp, Nagging, Constant, Discomfort. Timing: Constant. Modifying factor(s): meds, laying down and resting, walking. Vitals:  height is 5' 10"  (1.778 m) and weight is 230 lb (104.3 kg). His temperature is 97.3 F (36.3 C) (abnormal). His blood pressure is 142/80 (abnormal) and his pulse is 70. His oxygen saturation is 99%.   Reason for encounter: medication management.   The patient indicates doing well with the current medication regimen. No adverse reactions or side effects reported to the medications.   RTCB: 03/08/2021  Pharmacotherapy Assessment  Analgesic: Oxycodone IR 5 mg, 1 tab PO q 6 hrs (20 mg/day of oxycodone) + OxyContin 10 mg, 1 tab p.o. twice daily (20 mg/day of oxycodone) (total of 40 mg/day of oxycodone) MME/day: 60 mg/day.   Monitoring: [place] PMP: PDMP reviewed during this encounter.       Pharmacotherapy: No side-effects or adverse reactions reported. Compliance: No problems identified. Effectiveness: Clinically acceptable.  [name], RN  11/05/2020  2:43 PM  Sign when Signing Visit Nursing Pain Medication Assessment:  Safety precautions to be maintained throughout the outpatient stay will include: orient to surroundings, keep bed in low position, maintain call bell within reach at all times, provide assistance with transfer out of bed and ambulation.    Medication Inspection Compliance: Pill count conducted under aseptic conditions, in front of the patient. Neither the pills nor the bottle was removed from the patient's sight at any time. Once count was completed pills were immediately returned to the patient in their original bottle.   Medication: None brought in. Pill/Patch Count: None available to be counted. Bottle Appearance: No container available. Did not bring bottle(s) to appointment. Filled Date: N/A Last Medication  intake:  Today      UDS:  Summary  Date Value Ref Range Status  08/10/2020 Note  Final    Comment:    ==================================================================== [employer] (MW) ==================================================================== Test                             Result       Flag       Units  Drug Present and Declared for Prescription Verification   Oxycodone                      2790         EXPECTED   ng/mg creat   Oxymorphone                    496          EXPECTED   ng/mg creat   Noroxycodone                   4303         EXPECTED   ng/mg creat   Noroxymorphone                 139          EXPECTED   ng/mg creat    Sources of oxycodone are scheduled prescription medications.    Oxymorphone, noroxycodone, and noroxymorphone are expected    metabolites of oxycodone. Oxymorphone is also available as a    scheduled prescription medication.  ==================================================================== Test                      Result    Flag   Units      Ref Range   Creatinine              230              mg/dL      >=20 ==================================================================== Declared Medications:  The flagging and interpretation on this report are based on the  following declared medications.  Unexpected results may arise from  inaccuracies in the declared medications.   **Note: The testing scope of this panel includes these medications:   Oxycodone   **Note: The testing scope of this panel does not include the  following reported medications:   Baclofen (Lioresal)  Celecoxib (Celebrex)  Esomeprazole (Nexium)  Gabapentin (Neurontin)  Ibuprofen (Advil)  Naloxone (Narcan) ==================================================================== For clinical consultation, please call [phone]. ====================================================================      ROS  Constitutional: Denies any fever or  chills Gastrointestinal: No reported hemesis, hematochezia, vomiting, or acute GI distress Musculoskeletal: Denies any acute onset joint swelling, redness, loss of ROM, or weakness Neurological: No reported episodes of acute onset apraxia, aphasia, dysarthria, agnosia, amnesia, paralysis, loss of coordination, or loss of consciousness  Medication Review  baclofen, celecoxib, esomeprazole, gabapentin, ibuprofen, naloxone, and oxyCODONE  History Review  Allergy: Mr. Ostergaard has No Known Allergies. Drug: Mr. Livingood  reports no history of drug use. Alcohol:  reports current alcohol use of about 3.0 standard drinks per week. Tobacco:  reports that he has quit smoking. His smokeless tobacco use includes chew. Social: Mr. Peregoy  reports that he has quit smoking. His smokeless tobacco use includes chew. He reports current alcohol use of about 3.0 standard drinks per week. He reports that he does not use drugs. Medical:  has a past medical history of Acute postoperative pain (11/21/2017), Allergic rhinitis,  Chronic pain syndrome, DDD (degenerative disc disease), lumbosacral, GERD (gastroesophageal reflux disease), Hyperlipidemia, Osteoarthritis, and S/P appendectomy. Surgical: Mr. Spackman  has a past surgical history that includes Appendectomy; repairs of bilateral ankle fractures; Spinal fusion (N/A); and Spinal cord stimulator implant (N/A). Family: family history includes Diabetes in his maternal grandmother; Heart disease in his father.  Laboratory Chemistry Profile   Renal Lab Results  Component Value Date   BUN 22 04/27/2020   CREATININE 0.87 04/27/2020   BCR 27 (H) 12/12/2016   GFR 95.16 04/27/2020   GFRAA 120 12/12/2016   GFRNONAA 104 12/12/2016    Hepatic Lab Results  Component Value Date   AST 20 04/27/2020   ALT 18 04/27/2020   ALBUMIN 4.7 04/27/2020   ALKPHOS 69 04/27/2020   HCVAB NEGATIVE 12/30/2014    Electrolytes Lab Results  Component Value Date   NA 137 04/27/2020   K  3.9 04/27/2020   CL 102 04/27/2020   CALCIUM 10.3 04/27/2020   MG 1.9 12/12/2016    Bone Lab Results  Component Value Date   25OHVITD1 44 12/12/2016   25OHVITD2 <1.0 12/12/2016   25OHVITD3 43 12/12/2016    Inflammation (CRP: Acute Phase) (ESR: Chronic Phase) Lab Results  Component Value Date   CRP 2.5 12/12/2016   ESRSEDRATE 10 12/12/2016         Note: Above Lab results reviewed.  Recent Imaging Review  DG PAIN CLINIC C-ARM 1-60 MIN NO REPORT Fluoro was used, but no Radiologist interpretation will be provided.  Please refer to "NOTES" tab for provider progress note. Note: Reviewed        Physical Exam  General appearance: Well nourished, well developed, and well hydrated. In no apparent acute distress Mental status: Alert, oriented x 3 (person, place, & time)       Respiratory: No evidence of acute respiratory distress Eyes: PERLA Vitals: BP (!) 142/80   Pulse 70   Temp (!) 97.3 F (36.3 C)   Ht 5' 10"  (1.778 m)   Wt 230 lb (104.3 kg)   SpO2 99%   BMI 33.00 kg/m  BMI: Estimated body mass index is 33 kg/m as calculated from the following:   Height as of this encounter: 5' 10"  (1.778 m).   Weight as of this encounter: 230 lb (104.3 kg). Ideal: Ideal body weight: 73 kg (160 lb 15 oz) Adjusted ideal body weight: 85.5 kg (188 lb 9 oz)  Assessment   Status Diagnosis  Controlled Controlled Controlled 1. Chronic pain syndrome   2. Chronic low back pain (1ry area of Pain) (Bilateral) (L>R)   3. Chronic lower extremity pain (2ry area of Pain) (Left)   4. Epidural fibrosis   5. Failed back surgical syndrome (Fusion L4-S1)   6. Pharmacologic therapy   7. Chronic use of opiate for therapeutic purpose   8. Opiate use (60 MME/day)   9. Encounter for medication management      Updated Problems: No problems updated.  Plan of Care  Problem-specific:  No problem-specific Assessment & Plan notes found for this encounter.  Mr. JUANITA DEVINCENT has a current medication  list which includes the following long-term medication(s): celecoxib, gabapentin, [START ON 11/08/2020] oxycodone, [START ON 12/08/2020] oxycodone, [START ON 01/07/2021] oxycodone, [START ON 02/06/2021] oxycodone, [START ON 11/08/2020] oxycodone, [START ON 12/08/2020] oxycodone, [START ON 01/07/2021] oxycodone, [START ON 02/06/2021] oxycodone, baclofen, and esomeprazole.  Pharmacotherapy (Medications Ordered): Meds ordered this encounter  Medications   oxyCODONE (OXY IR/ROXICODONE) 5 MG immediate release tablet  Sig: Take 1 tablet (5 mg total) by mouth every 6 (six) hours as needed for severe pain. Must last 30 days.    Dispense:  120 tablet    Refill:  0    Not a duplicate. Do NOT delete! Dispense 1 day early if closed on refill date. Avoid benzodiazepines within 8 hours of opioids. Do not send refill requests.   oxyCODONE (OXY IR/ROXICODONE) 5 MG immediate release tablet    Sig: Take 1 tablet (5 mg total) by mouth every 6 (six) hours as needed for severe pain. Must last 30 days.    Dispense:  120 tablet    Refill:  0    Not a duplicate. Do NOT delete! Dispense 1 day early if closed on refill date. Avoid benzodiazepines within 8 hours of opioids. Do not send refill requests.   oxyCODONE (OXY IR/ROXICODONE) 5 MG immediate release tablet    Sig: Take 1 tablet (5 mg total) by mouth every 6 (six) hours as needed for severe pain. Must last 30 days.    Dispense:  120 tablet    Refill:  0    Not a duplicate. Do NOT delete! Dispense 1 day early if closed on refill date. Avoid benzodiazepines within 8 hours of opioids. Do not send refill requests.   oxyCODONE (OXYCONTIN) 10 mg 12 hr tablet    Sig: Take 1 tablet (10 mg total) by mouth every 12 (twelve) hours. Must last 30 days.    Dispense:  60 tablet    Refill:  0    Not a duplicate. Do NOT delete! Dispense 1 day early if closed on refill date. Avoid benzodiazepines within 8 hours of opioids. Do not send refill requests.   oxyCODONE (OXYCONTIN) 10 mg  12 hr tablet    Sig: Take 1 tablet (10 mg total) by mouth every 12 (twelve) hours. Must last 30 days.    Dispense:  60 tablet    Refill:  0    Not a duplicate. Do NOT delete! Dispense 1 day early if closed on refill date. Avoid benzodiazepines within 8 hours of opioids. Do not send refill requests.   oxyCODONE (OXYCONTIN) 10 mg 12 hr tablet    Sig: Take 1 tablet (10 mg total) by mouth every 12 (twelve) hours. Must last 30 days.    Dispense:  60 tablet    Refill:  0    Not a duplicate. Do NOT delete! Dispense 1 day early if closed on refill date. Avoid benzodiazepines within 8 hours of opioids. Do not send refill requests.   oxyCODONE (OXYCONTIN) 10 mg 12 hr tablet    Sig: Take 1 tablet (10 mg total) by mouth every 12 (twelve) hours. Must last 30 days.    Dispense:  60 tablet    Refill:  0    Not a duplicate. Do NOT delete! Dispense 1 day early if closed on refill date. Avoid benzodiazepines within 8 hours of opioids. Do not send refill requests.   oxyCODONE (OXY IR/ROXICODONE) 5 MG immediate release tablet    Sig: Take 1 tablet (5 mg total) by mouth every 6 (six) hours as needed for severe pain. Must last 30 days.    Dispense:  120 tablet    Refill:  0    Not a duplicate. Do NOT delete! Dispense 1 day early if closed on refill date. Avoid benzodiazepines within 8 hours of opioids. Do not send refill requests.   naloxone (NARCAN) nasal spray 4 mg/0.1 mL  Sig: Place 1 spray into the nose as needed for up to 365 doses (for opioid-induced respiratory depresssion). In case of emergency (overdose), spray once into each nostril. If no response within 3 minutes, repeat application and call 122.    Dispense:  1 each    Refill:  0    Instruct patient in proper use of device.    Orders:  No orders of the defined types were placed in this encounter.  Follow-up plan:   Return in about 4 months (around 03/08/2021) for (M,W) (F2F) (MM).     Interventional Therapies  Risk  Complexity  Considerations:   Estimated body mass index is 33 kg/m as calculated from the following:   Height as of this encounter: 5' 10"  (1.778 m).   Weight as of this encounter: 230 lb (104.3 kg). WNL   Planned  Pending:   Pending further evaluation   Under consideration:   Possible intrathecal injection of diagnostic substance (opioid analgesic) as an intrathecal pump trial.  Should the patient be a good candidate for intrathecal pump, but then we could attempt removing the fractured leads from his nonfunctional spinal cord stimulator.  This would allow the patient to have MRIs in the future.  Possible Racz procedure #1    Completed:   Palliative bilateral lumbar facet block x2 (08/17/2017)  Palliative right lumbar facet RFA x1 (01/23/18)  Palliative left lumbar facet RFA x1 (11/21/17)  Therapeutic left caudal ESI x1 (05/14/2019) (100/100/50)  Therapeutic bilateral erector spinae muscle and quadratus lumborum muscle trigger point x2 (04/10/2018)    Therapeutic  Palliative (PRN) options:   Palliative bilateral lumbar facet block #3  Palliative right lumbar facet RFA #2  Palliative left lumbar facet RFA #2  Therapeutic left caudal ESI #2      Recent Visits Date Type Provider Dept  08/10/20 Office Visit Milinda Pointer, MD [facility]  Showing recent visits within past 90 days and meeting all other requirements Today's Visits Date Type Provider Dept  11/05/20 Office Visit Milinda Pointer, MD [facility]  Showing today's visits and meeting all other requirements Future Appointments No visits were found meeting these conditions. Showing future appointments within next 90 days and meeting all other requirements I discussed the assessment and treatment plan with the patient. The patient was provided an opportunity to ask questions and all were answered. The patient agreed with the plan and demonstrated an understanding of the instructions.  Patient advised to call  back or seek an in-person evaluation if the symptoms or condition worsens.  Duration of encounter: 30 minutes.  Note by: Gaspar Cola, MD Date: 11/05/2020; Time: 7:24 PM

## 2020-11-05 ENCOUNTER — Other Ambulatory Visit: Payer: Self-pay

## 2020-11-05 ENCOUNTER — Ambulatory Visit: Payer: No Typology Code available for payment source | Attending: Pain Medicine | Admitting: Pain Medicine

## 2020-11-05 ENCOUNTER — Encounter: Payer: Self-pay | Admitting: Pain Medicine

## 2020-11-05 VITALS — BP 142/80 | HR 70 | Temp 97.3°F | Ht 70.0 in | Wt 230.0 lb

## 2020-11-05 DIAGNOSIS — M961 Postlaminectomy syndrome, not elsewhere classified: Secondary | ICD-10-CM

## 2020-11-05 DIAGNOSIS — Z79891 Long term (current) use of opiate analgesic: Secondary | ICD-10-CM | POA: Diagnosis not present

## 2020-11-05 DIAGNOSIS — M79605 Pain in left leg: Secondary | ICD-10-CM

## 2020-11-05 DIAGNOSIS — G894 Chronic pain syndrome: Secondary | ICD-10-CM | POA: Diagnosis not present

## 2020-11-05 DIAGNOSIS — G96198 Other disorders of meninges, not elsewhere classified: Secondary | ICD-10-CM

## 2020-11-05 DIAGNOSIS — Z79899 Other long term (current) drug therapy: Secondary | ICD-10-CM

## 2020-11-05 DIAGNOSIS — F1190 Opioid use, unspecified, uncomplicated: Secondary | ICD-10-CM | POA: Diagnosis not present

## 2020-11-05 DIAGNOSIS — M5442 Lumbago with sciatica, left side: Secondary | ICD-10-CM

## 2020-11-05 DIAGNOSIS — G8929 Other chronic pain: Secondary | ICD-10-CM

## 2020-11-05 MED ORDER — OXYCODONE HCL 5 MG PO TABS: 5.0000 mg | ORAL_TABLET | Freq: Four times a day (QID) | ORAL | 0 refills | Status: DC | PRN

## 2020-11-05 MED ORDER — OXYCODONE HCL ER 10 MG PO T12A: 10.0000 mg | EXTENDED_RELEASE_TABLET | Freq: Two times a day (BID) | ORAL | 0 refills | Status: DC

## 2020-11-05 MED ORDER — NALOXONE HCL 4 MG/0.1ML NA LIQD: 1.0000 | NASAL | 0 refills | Status: AC | PRN

## 2020-11-05 NOTE — Progress Notes (Signed)
Nursing Pain Medication Assessment:  Safety precautions to be maintained throughout the outpatient stay will include: orient to surroundings, keep bed in low position, maintain call bell within reach at all times, provide assistance with transfer out of bed and ambulation.    Medication Inspection Compliance: Pill count conducted under aseptic conditions, in front of the patient. Neither the pills nor the bottle was removed from the patient's sight at any time. Once count was completed pills were immediately returned to the patient in their original bottle.   Medication: None brought in. Pill/Patch Count: None available to be counted. Bottle Appearance: No container available. Did not bring bottle(s) to appointment. Filled Date: N/A Last Medication intake:  Today

## 2020-12-01 DIAGNOSIS — H52203 Unspecified astigmatism, bilateral: Secondary | ICD-10-CM | POA: Diagnosis not present

## 2020-12-01 DIAGNOSIS — H401131 Primary open-angle glaucoma, bilateral, mild stage: Secondary | ICD-10-CM | POA: Diagnosis not present

## 2020-12-01 DIAGNOSIS — H5213 Myopia, bilateral: Secondary | ICD-10-CM | POA: Diagnosis not present

## 2020-12-01 DIAGNOSIS — H524 Presbyopia: Secondary | ICD-10-CM | POA: Diagnosis not present

## 2021-02-06 ENCOUNTER — Other Ambulatory Visit: Payer: Self-pay | Admitting: Pain Medicine

## 2021-02-06 DIAGNOSIS — G894 Chronic pain syndrome: Secondary | ICD-10-CM

## 2021-02-06 DIAGNOSIS — Z79899 Other long term (current) drug therapy: Secondary | ICD-10-CM

## 2021-02-06 DIAGNOSIS — Z79891 Long term (current) use of opiate analgesic: Secondary | ICD-10-CM

## 2021-03-02 NOTE — Progress Notes (Signed)
PROVIDER NOTE: Information contained herein reflects review and annotations entered in association with encounter. Interpretation of such information and data should be left to medically-trained personnel. Information provided to patient can be located elsewhere in the medical record under "Patient Instructions". Document created using STT-dictation technology, any transcriptional errors that may result from process are unintentional.    Patient: Lance Roman  Service Category: E/M  Provider: Gaspar Cola, MD  DOB: Apr 02, 1960  DOS: 03/03/2021  Specialty: Interventional Pain Management  MRN: [id number]  Setting: Ambulatory outpatient  PCP: Hoyt Koch, MD  Type: Established Patient    Referring Provider: Hoyt Koch, *  Location: Office  Delivery: Face-to-face     HPI  Lance Roman, a 59 y.o. year old male, is here today because of his Chronic pain syndrome [G89.4]. Lance Roman primary complain today is Back Pain (Low, mid) Last encounter: My last encounter with him was on 02/06/2021. Pertinent problems: Lance Roman has Chronic low back pain (1ry area of Pain) (Bilateral) (L>R) w/o sciatica; Chronic pain syndrome; Chronic lower extremity pain (2ry area of Pain) (Left); Chronic lumbar radicular pain (L5) (Left); Retrolisthesis (4-5 mm) (L3 over L4) (L2 over L3; Failed back surgical syndrome (Fusion L4-S1); Lumbar facet hypertrophy (Bilateral); Lumbar facet syndrome (Bilateral) (L>R); Lumbar facet osteoarthritis (Bilateral); Osteoarthritis of lumbar spine; Spondylosis without myelopathy or radiculopathy, lumbosacral region; Other specified dorsopathies, sacral and sacrococcygeal region; Chronic hip pain (Left); Chronic knee pain (Left); DDD (degenerative disc disease), lumbosacral; Neurogenic pain; Trigger point with back pain; Chronic upper back pain; Chronic musculoskeletal pain; and Epidural fibrosis on their pertinent problem list. Pain Assessment: Severity of Chronic  pain is reported as a 8 /10. Location: Back Lower, Mid/radiates down left leg in the back to heel. Onset: More than a month ago. Quality: Aching, Shooting, Sharp, Venus, Dull. Timing: Intermittent. Modifying factor(s): medication, rest. Vitals:  height is 5' 10"  (1.778 m) and weight is 240 lb (108.9 kg). His temperature is 97.2 F (36.2 C) (abnormal). His blood pressure is 162/80 (abnormal) and his pulse is 69. His respiration is 18 and oxygen saturation is 100%.   Reason for encounter: medication management.   The patient indicates doing well with the current medication regimen. No adverse reactions or side effects reported to the medications.  In addition to this, the patient indicates having a flareup of his low back pain and left lower extremity pain.  He indicates that the back pain is worse in the leg pain.  The leg pain is described to run through the back of the leg down to the calf area, but does not seem to get into the foot.  Provocative hyperextension on rotation of the lumbar spine and Kemp maneuver exactly reproduced the patient's pain, bilaterally, including the left lower extremity referred pain.  The patient indicated that he is interested in having some type of procedure to bring this pain down.  He refers that while he is not working, the medications keep the pain under control however, the more he does the worst the pain gets.  He indicates following our recommendations for the Preemptive Analgesia technique and he also admits that it does help, but it does not completely eliminate the pain.  For this reason, he is interested in having something done.  Based on the above, I believe that he would benefit from a therapeutic/palliative bilateral lumbar facet block under fluoroscopic guidance.  RTCB: 06/06/2021 Nonopioids transferred 02/05/2020: Baclofen  Pharmacotherapy Assessment  Analgesic: Oxycodone IR 5 mg,  1 tab PO q 6 hrs (20 mg/day of oxycodone) + OxyContin 10 mg, 1 tab p.o. twice  daily (20 mg/day of oxycodone) (total of 40 mg/day of oxycodone) MME/day: 60 mg/day.   Monitoring: [place] PMP: PDMP reviewed during this encounter.       Pharmacotherapy: No side-effects or adverse reactions reported. Compliance: No problems identified. Effectiveness: Clinically acceptable.  Dewayne Shorter, RN  03/03/2021  8:04 AM  Sign when Signing Visit Nursing Pain Medication Assessment:  Safety precautions to be maintained throughout the outpatient stay will include: orient to surroundings, keep bed in low position, maintain call bell within reach at all times, provide assistance with transfer out of bed and ambulation.  Medication Inspection Compliance: Pill count conducted under aseptic conditions, in front of the patient. Neither the pills nor the bottle was removed from the patient's sight at any time. Once count was completed pills were immediately returned to the patient in their original bottle.  Medication: Oxycodone IR 5 mg Pill/Patch Count:  15 of 120 pills remain Pill/Patch Appearance: Markings consistent with prescribed medication Bottle Appearance: Standard pharmacy container. Clearly labeled. Filled Date: 74 / 12 / 2022 Last Medication intake:  Today  Oxycodone 10 mg  11/60 Filled 02-06-2021 today    UDS:  Summary  Date Value Ref Range Status  08/10/2020 Note  Final    Comment:    ==================================================================== [employer] Select 13 (MW) ==================================================================== Test                             Result       Flag       Units  Drug Present and Declared for Prescription Verification   Oxycodone                      2790         EXPECTED   ng/mg creat   Oxymorphone                    496          EXPECTED   ng/mg creat   Noroxycodone                   4303         EXPECTED   ng/mg creat   Noroxymorphone                 139          EXPECTED   ng/mg creat    Sources of oxycodone are scheduled  prescription medications.    Oxymorphone, noroxycodone, and noroxymorphone are expected    metabolites of oxycodone. Oxymorphone is also available as a    scheduled prescription medication.  ==================================================================== Test                      Result    Flag   Units      Ref Range   Creatinine              230              mg/dL      >=20 ==================================================================== Declared Medications:  The flagging and interpretation on this report are based on the  following declared medications.  Unexpected results may arise from  inaccuracies in the declared medications.   **Note: The testing scope of this panel includes these medications:   Oxycodone   **  Note: The testing scope of this panel does not include the  following reported medications:   Baclofen (Lioresal)  Celecoxib (Celebrex)  Esomeprazole (Nexium)  Gabapentin (Neurontin)  Ibuprofen (Advil)  Naloxone (Narcan) ==================================================================== For clinical consultation, please call [phone]. ====================================================================      ROS  Constitutional: Denies any fever or chills Gastrointestinal: No reported hemesis, hematochezia, vomiting, or acute GI distress Musculoskeletal: Denies any acute onset joint swelling, redness, loss of ROM, or weakness Neurological: No reported episodes of acute onset apraxia, aphasia, dysarthria, agnosia, amnesia, paralysis, loss of coordination, or loss of consciousness  Medication Review  baclofen, celecoxib, esomeprazole, gabapentin, ibuprofen, naloxone, and oxyCODONE  History Review  Allergy: Lance Roman has No Known Allergies. Drug: Lance Roman  reports no history of drug use. Alcohol:  reports current alcohol use of about 3.0 standard drinks per week. Tobacco:  reports that he has quit smoking. His smokeless tobacco use includes  chew. Social: Lance Roman  reports that he has quit smoking. His smokeless tobacco use includes chew. He reports current alcohol use of about 3.0 standard drinks per week. He reports that he does not use drugs. Medical:  has a past medical history of Acute postoperative pain (11/21/2017), Allergic rhinitis, Chronic pain syndrome, DDD (degenerative disc disease), lumbosacral, GERD (gastroesophageal reflux disease), Hyperlipidemia, Osteoarthritis, and S/P appendectomy. Surgical: Lance Roman  has a past surgical history that includes Appendectomy; repairs of bilateral ankle fractures; Spinal fusion (N/A); and Spinal cord stimulator implant (N/A). Family: family history includes Diabetes in his maternal grandmother; Heart disease in his father.  Laboratory Chemistry Profile   Renal Lab Results  Component Value Date   BUN 22 04/27/2020   CREATININE 0.87 04/27/2020   BCR 27 (H) 12/12/2016   GFR 95.16 04/27/2020   GFRAA 120 12/12/2016   GFRNONAA 104 12/12/2016    Hepatic Lab Results  Component Value Date   AST 20 04/27/2020   ALT 18 04/27/2020   ALBUMIN 4.7 04/27/2020   ALKPHOS 69 04/27/2020   HCVAB NEGATIVE 12/30/2014    Electrolytes Lab Results  Component Value Date   NA 137 04/27/2020   K 3.9 04/27/2020   CL 102 04/27/2020   CALCIUM 10.3 04/27/2020   MG 1.9 12/12/2016    Bone Lab Results  Component Value Date   25OHVITD1 44 12/12/2016   25OHVITD2 <1.0 12/12/2016   25OHVITD3 43 12/12/2016    Inflammation (CRP: Acute Phase) (ESR: Chronic Phase) Lab Results  Component Value Date   CRP 2.5 12/12/2016   ESRSEDRATE 10 12/12/2016         Note: Above Lab results reviewed.  Recent Imaging Review  DG PAIN CLINIC C-ARM 1-60 MIN NO REPORT Fluoro was used, but no Radiologist interpretation will be provided.  Please refer to "NOTES" tab for provider progress note. Note: Reviewed        Physical Exam  General appearance: Well nourished, well developed, and well hydrated. In no  apparent acute distress Mental status: Alert, oriented x 3 (person, place, & time)       Respiratory: No evidence of acute respiratory distress Eyes: PERLA Vitals: BP (!) 162/80 (BP Location: Right Arm, Patient Position: Sitting, Cuff Size: Normal)   Pulse 69   Temp (!) 97.2 F (36.2 C)   Resp 18   Ht 5' 10"  (1.778 m)   Wt 240 lb (108.9 kg)   SpO2 100%   BMI 34.44 kg/m  BMI: Estimated body mass index is 34.44 kg/m as calculated from the following:   Height  as of this encounter: 5' 10"  (1.778 m).   Weight as of this encounter: 240 lb (108.9 kg). Ideal: Ideal body weight: 73 kg (160 lb 15 oz) Adjusted ideal body weight: 87.3 kg (192 lb 9 oz)  Assessment   Status Diagnosis  Controlled Controlled Controlled 1. Chronic pain syndrome   2. Chronic lower extremity pain (2ry area of Pain) (Left)   3. Chronic upper back pain   4. Chronic musculoskeletal pain   5. Failed back surgical syndrome (Fusion L4-S1)   6. Lumbar facet syndrome (Bilateral) (L>R)   7. Pharmacologic therapy   8. Chronic use of opiate for therapeutic purpose   9. Encounter for chronic pain management   10. Encounter for medication management   11. Chronic low back pain (1ry area of Pain) (Bilateral) (L>R) w/o sciatica   12. Lumbar facet osteoarthritis (Bilateral)   13. Lumbar facet hypertrophy (Bilateral)      Updated Problems: Problem  Chronic low back pain (1ry area of Pain) (Bilateral) (L>R) w/o sciatica    Plan of Care  Problem-specific:  No problem-specific Assessment & Plan notes found for this encounter.  Lance Roman has a current medication list which includes the following long-term medication(s): baclofen, celecoxib, esomeprazole, gabapentin, [START ON 03/08/2021] oxycodone, [START ON 04/07/2021] oxycodone, [START ON 05/07/2021] oxycodone, [START ON 03/08/2021] oxycodone, [START ON 04/07/2021] oxycodone, and [START ON 05/07/2021] oxycodone.  Pharmacotherapy (Medications Ordered): Meds  ordered this encounter  Medications   oxyCODONE (OXY IR/ROXICODONE) 5 MG immediate release tablet    Sig: Take 1 tablet (5 mg total) by mouth every 6 (six) hours as needed for severe pain. Must last 30 days.    Dispense:  120 tablet    Refill:  0    DO NOT: delete (not duplicate); no partial-fill (will deny script to complete), no refill request (F/U required). DISPENSE: 1 day early if closed on fill date. WARN: No CNS-depressants within 8 hrs of med.   oxyCODONE (OXYCONTIN) 10 mg 12 hr tablet    Sig: Take 1 tablet (10 mg total) by mouth every 12 (twelve) hours. Must last 30 days.    Dispense:  60 tablet    Refill:  0    DO NOT: delete (not duplicate); no partial-fill (will deny script to complete), no refill request (F/U required). DISPENSE: 1 day early if closed on fill date. WARN: No CNS-depressants within 8 hrs of med.   oxyCODONE (OXYCONTIN) 10 mg 12 hr tablet    Sig: Take 1 tablet (10 mg total) by mouth every 12 (twelve) hours. Must last 30 days.    Dispense:  60 tablet    Refill:  0    DO NOT: delete (not duplicate); no partial-fill (will deny script to complete), no refill request (F/U required). DISPENSE: 1 day early if closed on fill date. WARN: No CNS-depressants within 8 hrs of med.   oxyCODONE (OXYCONTIN) 10 mg 12 hr tablet    Sig: Take 1 tablet (10 mg total) by mouth every 12 (twelve) hours. Must last 30 days.    Dispense:  60 tablet    Refill:  0    DO NOT: delete (not duplicate); no partial-fill (will deny script to complete), no refill request (F/U required). DISPENSE: 1 day early if closed on fill date. WARN: No CNS-depressants within 8 hrs of med.   oxyCODONE (OXY IR/ROXICODONE) 5 MG immediate release tablet    Sig: Take 1 tablet (5 mg total) by mouth every 6 (six) hours as needed  for severe pain. Must last 30 days.    Dispense:  120 tablet    Refill:  0    DO NOT: delete (not duplicate); no partial-fill (will deny script to complete), no refill request (F/U required).  DISPENSE: 1 day early if closed on fill date. WARN: No CNS-depressants within 8 hrs of med.   oxyCODONE (OXY IR/ROXICODONE) 5 MG immediate release tablet    Sig: Take 1 tablet (5 mg total) by mouth every 6 (six) hours as needed for severe pain. Must last 30 days.    Dispense:  120 tablet    Refill:  0    DO NOT: delete (not duplicate); no partial-fill (will deny script to complete), no refill request (F/U required). DISPENSE: 1 day early if closed on fill date. WARN: No CNS-depressants within 8 hrs of med.    Orders:  Orders Placed This Encounter  Procedures   LUMBAR FACET(MEDIAL BRANCH NERVE BLOCK) MBNB    Standing Status:   Future    Standing Expiration Date:   06/01/2021    Scheduling Instructions:     Procedure: Lumbar facet block (AKA.: Lumbosacral medial branch nerve block)     Side: Bilateral     Level: L3-4, L4-5, & L5-S1 Facets (L2, L3, L4, L5, & S1 Medial Branch Nerves)     Sedation: Patient's choice.     Timeframe: ASAA    Order Specific Question:   Where will this procedure be performed?    Answer:   [facility]    Follow-up plan:   Return for (Clinic) procedure: (B) L-FCT BLK, (Sed-anx).     Interventional Therapies  Risk  Complexity Considerations:   Estimated body mass index is 34.44 kg/m as calculated from the following:   Height as of this encounter: 5' 10"  (1.778 m).   Weight as of this encounter: 240 lb (108.9 kg). WNL   Planned  Pending:   Therapeutic/palliative bilateral lumbar facet MBB #3    Under consideration:   Possible intrathecal pump trial.  Possible Racz procedure #1    Completed:   Diagnostic/therapeutic bilateral lumbar facet MBB x2 (08/17/2017) (100/90/90/90)  Palliative right lumbar facet RFA x1 (01/23/18) (100/100/100/75)  Palliative left lumbar facet RFA x1 (11/21/17) (0/0/0/0)  Therapeutic left caudal ESI x1 (05/14/2019) (100/100/50)  Therapeutic bilateral erector spinae & quadratus lumborum muscle MNB/TPI x2 (04/10/2018)  (100/100/0/0)   Therapeutic  Palliative (PRN) options:   Therapeutic/palliative bilateral lumbar facet MBB  Palliative right lumbar facet RFA #2  Palliative left lumbar facet RFA #2     Recent Visits No visits were found meeting these conditions. Showing recent visits within past 90 days and meeting all other requirements Today's Visits Date Type Provider Dept  03/03/21 Office Visit Milinda Pointer, MD [facility]  Showing today's visits and meeting all other requirements Future Appointments Date Type Provider Dept  05/26/21 Appointment Milinda Pointer, MD [facility]  Showing future appointments within next 90 days and meeting all other requirements I discussed the assessment and treatment plan with the patient. The patient was provided an opportunity to ask questions and all were answered. The patient agreed with the plan and demonstrated an understanding of the instructions.  Patient advised to call back or seek an in-person evaluation if the symptoms or condition worsens.  Duration of encounter: 30 minutes.  Note by: Gaspar Cola, MD Date: 03/03/2021; Time: 8:28 AM

## 2021-03-03 ENCOUNTER — Encounter: Payer: Self-pay | Admitting: Pain Medicine

## 2021-03-03 ENCOUNTER — Other Ambulatory Visit: Payer: Self-pay

## 2021-03-03 ENCOUNTER — Ambulatory Visit: Payer: No Typology Code available for payment source | Attending: Pain Medicine | Admitting: Pain Medicine

## 2021-03-03 VITALS — BP 162/80 | HR 69 | Temp 97.2°F | Resp 18 | Ht 70.0 in | Wt 240.0 lb

## 2021-03-03 DIAGNOSIS — Z79899 Other long term (current) drug therapy: Secondary | ICD-10-CM | POA: Diagnosis present

## 2021-03-03 DIAGNOSIS — M47816 Spondylosis without myelopathy or radiculopathy, lumbar region: Secondary | ICD-10-CM | POA: Insufficient documentation

## 2021-03-03 DIAGNOSIS — M7918 Myalgia, other site: Secondary | ICD-10-CM | POA: Diagnosis present

## 2021-03-03 DIAGNOSIS — G8929 Other chronic pain: Secondary | ICD-10-CM | POA: Insufficient documentation

## 2021-03-03 DIAGNOSIS — G894 Chronic pain syndrome: Secondary | ICD-10-CM | POA: Insufficient documentation

## 2021-03-03 DIAGNOSIS — M961 Postlaminectomy syndrome, not elsewhere classified: Secondary | ICD-10-CM | POA: Diagnosis present

## 2021-03-03 DIAGNOSIS — M545 Low back pain, unspecified: Secondary | ICD-10-CM | POA: Diagnosis present

## 2021-03-03 DIAGNOSIS — M79605 Pain in left leg: Secondary | ICD-10-CM | POA: Insufficient documentation

## 2021-03-03 DIAGNOSIS — M549 Dorsalgia, unspecified: Secondary | ICD-10-CM | POA: Insufficient documentation

## 2021-03-03 DIAGNOSIS — Z79891 Long term (current) use of opiate analgesic: Secondary | ICD-10-CM | POA: Insufficient documentation

## 2021-03-03 MED ORDER — OXYCODONE HCL ER 10 MG PO T12A: 10.0000 mg | EXTENDED_RELEASE_TABLET | Freq: Two times a day (BID) | ORAL | 0 refills | Status: DC

## 2021-03-03 MED ORDER — OXYCODONE HCL 5 MG PO TABS
5.0000 mg | ORAL_TABLET | Freq: Four times a day (QID) | ORAL | 0 refills | Status: DC | PRN
Start: 2021-05-07 — End: 2021-05-26

## 2021-03-03 MED ORDER — OXYCODONE HCL 5 MG PO TABS
5.0000 mg | ORAL_TABLET | Freq: Four times a day (QID) | ORAL | 0 refills | Status: DC | PRN
Start: 2021-03-08 — End: 2021-05-13

## 2021-03-03 MED ORDER — OXYCODONE HCL 5 MG PO TABS: 5.0000 mg | ORAL_TABLET | Freq: Four times a day (QID) | ORAL | 0 refills | Status: DC | PRN

## 2021-03-03 NOTE — Patient Instructions (Addendum)
______________________________________________________________________  Preparing for Procedure with Sedation  NOTICE: Due to recent regulatory changes, starting on October 26, 2020, procedures requiring intravenous (IV) sedation will no longer be performed at the [facility].  These types of procedures are required to be performed at [facility] ambulatory surgery facility.  We are very sorry for the inconvenience.  Procedure appointments are limited to planned procedures: No Prescription Refills. No disability issues will be discussed. No medication changes will be discussed.  Instructions: Oral Intake: Do not eat or drink anything for at least 8 hours prior to your procedure. (Exception: Blood Pressure Medication. See below.) Transportation: A driver is required. You may not drive yourself after the procedure. Blood Pressure Medicine: Do not forget to take your blood pressure medicine with a sip of water the morning of the procedure. If your Diastolic (lower reading) is above 100 mmHg, elective cases will be cancelled/rescheduled. Blood thinners: These will need to be stopped for procedures. Notify our staff if you are taking any blood thinners. Depending on which one you take, there will be specific instructions on how and when to stop it. Diabetics on insulin: Notify the staff so that you can be scheduled 1st case in the morning. If your diabetes requires high dose insulin, take only  of your normal insulin dose the morning of the procedure and notify the staff that you have done so. Preventing infections: Shower with an antibacterial soap the morning of your procedure. Build-up your immune system: Take 1000 mg of Vitamin C with every meal (3 times a day) the day prior to your procedure. Antibiotics: Inform the staff if you have a condition or reason that requires you to take antibiotics before dental procedures. Pregnancy: If you are pregnant, call and cancel the procedure. Sickness: If  you have a cold, fever, or any active infections, call and cancel the procedure. Arrival: You must be in the facility at least 30 minutes prior to your scheduled procedure. Children: Do not bring children with you. Dress appropriately: Bring dark clothing that you would not mind if they get stained. Valuables: Do not bring any jewelry or valuables.  Reasons to call and reschedule or cancel your procedure: (Following these recommendations will minimize the risk of a serious complication.) Surgeries: Avoid having procedures within 2 weeks of any surgery. (Avoid for 2 weeks before or after any surgery). Flu Shots: Avoid having procedures within 2 weeks of a flu shots. (Avoid for 2 weeks before or after immunizations). Barium: Avoid having a procedure within 7-10 days after having had a radiological study involving the use of radiological contrast. (Myelograms, Barium swallow or enema study). Heart attacks: Avoid any elective procedures or surgeries for the initial 6 months after a "Myocardial Infarction" (Heart Attack). Blood thinners: It is imperative that you stop these medications before procedures. Let us know if you if you take any blood thinner.  Infection: Avoid procedures during or within two weeks of an infection (including chest colds or gastrointestinal problems). Symptoms associated with infections include: Localized redness, fever, chills, night sweats or profuse sweating, burning sensation when voiding, cough, congestion, stuffiness, runny nose, sore throat, diarrhea, nausea, vomiting, cold or Flu symptoms, recent or current infections. It is specially important if the infection is over the area that we intend to treat. Heart and lung problems: Symptoms that may suggest an active cardiopulmonary problem include: cough, chest pain, breathing difficulties or shortness of breath, dizziness, ankle swelling, uncontrolled high or unusually low blood pressure, and/or palpitations. If you are  experiencing any of these symptoms, cancel your procedure and contact your primary care physician for an evaluation.  Remember:  Regular Business hours are:  Monday to Thursday 8:00 AM to 4:00 PM  Provider's Schedule: Francisco Naveira, MD:  Procedure days: Tuesday and Thursday 7:30 AM to 4:00 PM  Bilal Lateef, MD:  Procedure days: Monday and Wednesday 7:30 AM to 4:00 PM ______________________________________________________________________  ____________________________________________________________________________________________  General Risks and Possible Complications  Patient Responsibilities: It is important that you read this as it is part of your informed consent. It is our duty to inform you of the risks and possible complications associated with treatments offered to you. It is your responsibility as a patient to read this and to ask questions about anything that is not clear or that you believe was not covered in this document.  Patient's Rights: You have the right to refuse treatment. You also have the right to change your mind, even after initially having agreed to have the treatment done. However, under this last option, if you wait until the last second to change your mind, you may be charged for the materials used up to that point.  Introduction: Medicine is not an exact science. Everything in Medicine, including the lack of treatment(s), carries the potential for danger, harm, or loss (which is by definition: Risk). In Medicine, a complication is a secondary problem, condition, or disease that can aggravate an already existing one. All treatments carry the risk of possible complications. The fact that a side effects or complications occurs, does not imply that the treatment was conducted incorrectly. It must be clearly understood that these can happen even when everything is done following the highest safety standards.  No treatment: You can choose not to proceed with the  proposed treatment alternative. The "PRO(s)" would include: avoiding the risk of complications associated with the therapy. The "CON(s)" would include: not getting any of the treatment benefits. These benefits fall under one of three categories: diagnostic; therapeutic; and/or palliative. Diagnostic benefits include: getting information which can ultimately lead to improvement of the disease or symptom(s). Therapeutic benefits are those associated with the successful treatment of the disease. Finally, palliative benefits are those related to the decrease of the primary symptoms, without necessarily curing the condition (example: decreasing the pain from a flare-up of a chronic condition, such as incurable terminal cancer).  General Risks and Complications: These are associated to most interventional treatments. They can occur alone, or in combination. They fall under one of the following six (6) categories: no benefit or worsening of symptoms; bleeding; infection; nerve damage; allergic reactions; and/or death. No benefits or worsening of symptoms: In Medicine there are no guarantees, only probabilities. No healthcare provider can ever guarantee that a medical treatment will work, they can only state the probability that it may. Furthermore, there is always the possibility that the condition may worsen, either directly, or indirectly, as a consequence of the treatment. Bleeding: This is more common if the patient is taking a blood thinner, either prescription or over the counter (example: Goody Powders, Fish oil, Aspirin, Garlic, etc.), or if suffering a condition associated with impaired coagulation (example: Hemophilia, cirrhosis of the liver, low platelet counts, etc.). However, even if you do not have one on these, it can still happen. If you have any of these conditions, or take one of these drugs, make sure to notify your treating physician. Infection: This is more common in patients with a compromised  immune system, either due to disease (example:   diabetes, cancer, human immunodeficiency virus [HIV], etc.), or due to medications or treatments (example: therapies used to treat cancer and rheumatological diseases). However, even if you do not have one on these, it can still happen. If you have any of these conditions, or take one of these drugs, make sure to notify your treating physician. Nerve Damage: This is more common when the treatment is an invasive one, but it can also happen with the use of medications, such as those used in the treatment of cancer. The damage can occur to small secondary nerves, or to large primary ones, such as those in the spinal cord and brain. This damage may be temporary or permanent and it may lead to impairments that can range from temporary numbness to permanent paralysis and/or brain death. Allergic Reactions: Any time a substance or material comes in contact with our body, there is the possibility of an allergic reaction. These can range from a mild skin rash (contact dermatitis) to a severe systemic reaction (anaphylactic reaction), which can result in death. Death: In general, any medical intervention can result in death, most of the time due to an unforeseen complication. ____________________________________________________________________________________________ Facet Blocks Patient Information  Description: The facets are joints in the spine between the vertebrae.  Like any joints in the body, facets can become irritated and painful.  Arthritis can also effect the facets.  By injecting steroids and local anesthetic in and around these joints, we can temporarily block the nerve supply to them.  Steroids act directly on irritated nerves and tissues to reduce selling and inflammation which often leads to decreased pain.  Facet blocks may be done anywhere along the spine from the neck to the low back depending upon the location of your pain.   After numbing the skin  with local anesthetic (like Novocaine), a small needle is passed onto the facet joints under x-ray guidance.  You may experience a sensation of pressure while this is being done.  The entire block usually lasts about 15-25 minutes.   Conditions which may be treated by facet blocks:  Low back/buttock pain Neck/shoulder pain Certain types of headaches  Preparation for the injection:  Do not eat any solid food or dairy products within 8 hours of your appointment. You may drink clear liquid up to 3 hours before appointment.  Clear liquids include water, black coffee, juice or soda.  No milk or cream please. You may take your regular medication, including pain medications, with a sip of water before your appointment.  Diabetics should hold regular insulin (if taken separately) and take 1/2 normal NPH dose the morning of the procedure.  Carry some sugar containing items with you to your appointment. A driver must accompany you and be prepared to drive you home after your procedure. Bring all your current medications with you. An IV may be inserted and sedation may be given at the discretion of the physician. A blood pressure cuff, EKG and other monitors will often be applied during the procedure.  Some patients may need to have extra oxygen administered for a short period. You will be asked to provide medical information, including your allergies and medications, prior to the procedure.  We must know immediately if you are taking blood thinners (like Coumadin/Warfarin) or if you are allergic to IV iodine contrast (dye).  We must know if you could possible be pregnant.  Possible side-effects:  Bleeding from needle site Infection (rare, may require surgery) Nerve injury (rare) Numbness & tingling (temporary) Difficulty  urinating (rare, temporary) Spinal headache (a headache worse with upright posture) Light-headedness (temporary) Pain at injection site (serveral days) Decreased blood pressure  (rare, temporary) Weakness in arm/leg (temporary) Pressure sensation in back/neck (temporary)   Call if you experience:  Fever/chills associated with headache or increased back/neck pain Headache worsened by an upright position New onset, weakness or numbness of an extremity below the injection site Hives or difficulty breathing (go to the emergency room) Inflammation or drainage at the injection site(s) Severe back/neck pain greater than usual New symptoms which are concerning to you  Please note:  Although the local anesthetic injected can often make your back or neck feel good for several hours after the injection, the pain will likely return. It takes 3-7 days for steroids to work.  You may not notice any pain relief for at least one week.  If effective, we will often do a series of 2-3 injections spaced 3-6 weeks apart to maximally decrease your pain.  After the initial series, you may be a candidate for a more permanent nerve block of the facets.  If you have any questions, please call #[phone]) [facility]

## 2021-03-03 NOTE — Progress Notes (Signed)
Nursing Pain Medication Assessment:  Safety precautions to be maintained throughout the outpatient stay will include: orient to surroundings, keep bed in low position, maintain call bell within reach at all times, provide assistance with transfer out of bed and ambulation.  Medication Inspection Compliance: Pill count conducted under aseptic conditions, in front of the patient. Neither the pills nor the bottle was removed from the patient's sight at any time. Once count was completed pills were immediately returned to the patient in their original bottle.  Medication: Oxycodone IR 5 mg Pill/Patch Count:  15 of 120 pills remain Pill/Patch Appearance: Markings consistent with prescribed medication Bottle Appearance: Standard pharmacy container. Clearly labeled. Filled Date: 60 / 12 / 2022 Last Medication intake:  Today  Oxycodone 10 mg  11/60 Filled 02-06-2021 today

## 2021-03-18 DIAGNOSIS — H401131 Primary open-angle glaucoma, bilateral, mild stage: Secondary | ICD-10-CM | POA: Diagnosis not present

## 2021-04-27 ENCOUNTER — Other Ambulatory Visit: Payer: Self-pay

## 2021-04-27 ENCOUNTER — Encounter: Payer: Self-pay | Admitting: Pain Medicine

## 2021-04-27 ENCOUNTER — Ambulatory Visit
Admission: RE | Admit: 2021-04-27 | Discharge: 2021-04-27 | Disposition: A | Discharge status: Home / Self Care | Payer: BC Managed Care – PPO | Source: Ambulatory Visit | Attending: Pain Medicine | Admitting: Pain Medicine

## 2021-04-27 ENCOUNTER — Ambulatory Visit: Payer: No Typology Code available for payment source | Attending: Pain Medicine | Admitting: Pain Medicine

## 2021-04-27 VITALS — BP 153/87 | HR 60 | Temp 97.4°F | Resp 15 | Ht 70.0 in | Wt 240.0 lb

## 2021-04-27 DIAGNOSIS — M5137 Other intervertebral disc degeneration, lumbosacral region: Secondary | ICD-10-CM | POA: Insufficient documentation

## 2021-04-27 DIAGNOSIS — G8929 Other chronic pain: Secondary | ICD-10-CM | POA: Insufficient documentation

## 2021-04-27 DIAGNOSIS — M545 Low back pain, unspecified: Secondary | ICD-10-CM | POA: Diagnosis present

## 2021-04-27 DIAGNOSIS — M4310 Spondylolisthesis, site unspecified: Secondary | ICD-10-CM | POA: Insufficient documentation

## 2021-04-27 DIAGNOSIS — M47817 Spondylosis without myelopathy or radiculopathy, lumbosacral region: Secondary | ICD-10-CM | POA: Diagnosis not present

## 2021-04-27 DIAGNOSIS — M47816 Spondylosis without myelopathy or radiculopathy, lumbar region: Secondary | ICD-10-CM | POA: Insufficient documentation

## 2021-04-27 DIAGNOSIS — T85192S Other mechanical complication of implanted electronic neurostimulator (electrode) of spinal cord, sequela: Secondary | ICD-10-CM | POA: Diagnosis present

## 2021-04-27 DIAGNOSIS — M961 Postlaminectomy syndrome, not elsewhere classified: Secondary | ICD-10-CM | POA: Insufficient documentation

## 2021-04-27 MED ORDER — LIDOCAINE HCL 2 % IJ SOLN
20.0000 mL | Freq: Once | INTRAMUSCULAR | Status: AC
  Administered 2021-04-27: 400 mg
  Filled 2021-04-27: qty 20

## 2021-04-27 MED ORDER — ROPIVACAINE HCL 2 MG/ML IJ SOLN
18.0000 mL | Freq: Once | INTRAMUSCULAR | Status: AC
  Administered 2021-04-27: 18 mL via PERINEURAL

## 2021-04-27 MED ORDER — MIDAZOLAM HCL 5 MG/5ML IJ SOLN: 0.5000 mg | Freq: Once | INTRAMUSCULAR | Status: DC

## 2021-04-27 MED ORDER — PENTAFLUOROPROP-TETRAFLUOROETH EX AERO
INHALATION_SPRAY | Freq: Once | CUTANEOUS | Status: AC
  Administered 2021-04-27: 30 via TOPICAL
  Filled 2021-04-27: qty 116

## 2021-04-27 MED ORDER — TRIAMCINOLONE ACETONIDE 40 MG/ML IJ SUSP
80.0000 mg | Freq: Once | INTRAMUSCULAR | Status: AC
  Administered 2021-04-27: 80 mg
  Filled 2021-04-27: qty 2

## 2021-04-27 MED ORDER — ROPIVACAINE HCL 2 MG/ML IJ SOLN
INTRAMUSCULAR | Status: AC
  Filled 2021-04-27: qty 20

## 2021-04-27 MED ORDER — LACTATED RINGERS IV SOLN: 1000.0000 mL | Freq: Once | INTRAVENOUS | Status: DC

## 2021-04-27 NOTE — Progress Notes (Signed)
PROVIDER NOTE: Interpretation of information contained herein should be left to medically-trained personnel. Specific patient instructions are provided elsewhere under "Patient Instructions" section of medical record. This document was created in part using STT-dictation technology, any transcriptional errors that may result from this process are unintentional.  Patient: Lance Roman Type: Established DOB: 1960/04/12 MRN: [id number] PCP: Lance Koch, MD  Service: Procedure DOS: 04/27/2021 Setting: Ambulatory Location: Ambulatory outpatient facility Delivery: Face-to-face Provider: Gaspar Cola, MD Specialty: Interventional Pain Management Specialty designation: 09 Location: Outpatient facility Ref. Prov.: Lance Pointer, MD    Primary Reason for Visit: Interventional Pain Management Treatment. CC: Back Pain (Bilateral lumbar as well as mid back,, burning pain. )   Procedure:           Type: Lumbar Facet, Medial Branch Block(s) #3  Laterality: Bilateral  Level: L2, L3, L4, L5, & S1 Medial Branch Level(s). Injecting these levels blocks the L3-4 and L5-S1 lumbar facet joints. Imaging: Fluoroscopic guidance Anesthesia: Local anesthesia (1-2% Lidocaine) Anxiolysis: IV Versed         Sedation: None. DOS: 04/27/2021 Performed by: Lance Cola, MD  Primary Purpose: Diagnostic/Therapeutic Indications: Low back pain severe enough to impact quality of life or function. 1. Spondylosis without myelopathy or radiculopathy, lumbosacral region   2. Lumbar facet syndrome (Bilateral) (L>R)   3. Retrolisthesis (4-5 mm) of L2/L3 and L3/L4   4. DDD (degenerative disc disease), lumbosacral   5. Lumbar facet hypertrophy (Bilateral)   6. Lumbar facet osteoarthritis (Bilateral)   7. Chronic low back pain (1ry area of Pain) (Bilateral) (L>R) w/o sciatica   8. Failed back surgical syndrome (Fusion L4-S1)   9. Spinal cord stimulator dysfunction (fractured leads)    NAS-11  Pain score:   Pre-procedure: 6 /10   Post-procedure: 0-No pain (Standing moving)/10     Position / Prep / Materials:  Position: Prone  Prep solution: DuraPrep (Iodine Povacrylex [0.7% available iodine] and Isopropyl Alcohol, 74% w/w) Area Prepped: Posterolateral Lumbosacral Spine (Wide prep: From the lower border of the scapula down to the end of the tailbone and from flank to flank.)  Materials:  Tray: Block Needle(s):  Type: Spinal  Gauge (G): 22  Length: 5-in Qty: 4  Pre-op H&P Assessment:  Lance Roman is a 61 y.o. (year old), male patient, seen today for interventional treatment. He  has a past surgical history that includes Appendectomy; repairs of bilateral ankle fractures; Spinal fusion (N/A); and Spinal cord stimulator implant (N/A). Lance Roman has a current medication list which includes the following prescription(s): baclofen, celecoxib, esomeprazole, gabapentin, ibuprofen, latanoprost, naloxone, oxycodone, [START ON 05/07/2021] oxycodone, oxycodone, [START ON 05/07/2021] oxycodone, oxycodone, and oxycodone, and the following Facility-Administered Medications: lactated ringers and midazolam. His primarily concern today is the Back Pain (Bilateral lumbar as well as mid back,, burning pain. )  Initial Vital Signs:  Pulse/HCG Rate: 66  Temp: (!) 97.4 F (36.3 C) Resp: 16 BP: (!) 157/79 SpO2: 100 %  BMI: Estimated body mass index is 34.44 kg/m as calculated from the following:   Height as of this encounter: 5\' 10"  (1.778 m).   Weight as of this encounter: 240 lb (108.9 kg).  Risk Assessment: Allergies: Reviewed. He has No Known Allergies.  Allergy Precautions: None required Coagulopathies: Reviewed. None identified.  Blood-thinner therapy: None at this time Active Infection(s): Reviewed. None identified. Lance Roman is afebrile  Site Confirmation: Lance Roman was asked to confirm the procedure and laterality before marking the site Procedure checklist: Completed Consent:  Before  the procedure and under the influence of no sedative(s), amnesic(s), or anxiolytics, the patient was informed of the treatment options, risks and possible complications. To fulfill our ethical and legal obligations, as recommended by the American Medical Association's Code of Ethics, I have informed the patient of my clinical impression; the nature and purpose of the treatment or procedure; the risks, benefits, and possible complications of the intervention; the alternatives, including doing nothing; the risk(s) and benefit(s) of the alternative treatment(s) or procedure(s); and the risk(s) and benefit(s) of doing nothing. The patient was provided information about the general risks and possible complications associated with the procedure. These may include, but are not limited to: failure to achieve desired goals, infection, bleeding, organ or nerve damage, allergic reactions, paralysis, and death. In addition, the patient was informed of those risks and complications associated to Spine-related procedures, such as failure to decrease pain; infection (i.e.: Meningitis, epidural or intraspinal abscess); bleeding (i.e.: epidural hematoma, subarachnoid hemorrhage, or any other type of intraspinal or peri-dural bleeding); organ or nerve damage (i.e.: Any type of peripheral nerve, nerve root, or spinal cord injury) with subsequent damage to sensory, motor, and/or autonomic systems, resulting in permanent pain, numbness, and/or weakness of one or several areas of the body; allergic reactions; (i.e.: anaphylactic reaction); and/or death. Furthermore, the patient was informed of those risks and complications associated with the medications. These include, but are not limited to: allergic reactions (i.e.: anaphylactic or anaphylactoid reaction(s)); adrenal axis suppression; blood sugar elevation that in diabetics may result in ketoacidosis or comma; water retention that in patients with history of congestive heart  failure may result in shortness of breath, pulmonary edema, and decompensation with resultant heart failure; weight gain; swelling or edema; medication-induced neural toxicity; particulate matter embolism and blood vessel occlusion with resultant organ, and/or nervous system infarction; and/or aseptic necrosis of one or more joints. Finally, the patient was informed that Medicine is not an exact science; therefore, there is also the possibility of unforeseen or unpredictable risks and/or possible complications that may result in a catastrophic outcome. The patient indicated having understood very clearly. We have given the patient no guarantees and we have made no promises. Enough time was given to the patient to ask questions, all of which were answered to the patient's satisfaction. Mr. Penilla has indicated that he wanted to continue with the procedure. Attestation: I, the ordering provider, attest that I have discussed with the patient the benefits, risks, side-effects, alternatives, likelihood of achieving goals, and potential problems during recovery for the procedure that I have provided informed consent. Date   Time: 04/27/2021  9:13 AM  Pre-Procedure Preparation:  Monitoring: As per clinic protocol. Respiration, ETCO2, SpO2, BP, heart rate and rhythm monitor placed and checked for adequate function Safety Precautions: Patient was assessed for positional comfort and pressure points before starting the procedure. Time-out: I initiated and conducted the "Time-out" before starting the procedure, as per protocol. The patient was asked to participate by confirming the accuracy of the "Time Out" information. Verification of the correct person, site, and procedure were performed and confirmed by me, the nursing staff, and the patient. "Time-out" conducted as per Joint Commission's Universal Protocol (UP.01.01.01). Time: 1027  Description of Procedure:          Laterality: Bilateral. The procedure was  performed in identical fashion on both sides. Levels:  L2, L3, L4, L5, & S1 Medial Branch Level(s)  Safety Precautions: Aspiration looking for blood return was conducted prior to all injections. At no  point did we inject any substances, as a needle was being advanced. Before injecting, the patient was told to immediately notify me if he was experiencing any new onset of "ringing in the ears, or metallic taste in the mouth". No attempts were made at seeking any paresthesias. Safe injection practices and needle disposal techniques used. Medications properly checked for expiration dates. SDV (single dose vial) medications used. After the completion of the procedure, all disposable equipment used was discarded in the proper designated medical waste containers. Local Anesthesia: Protocol guidelines were followed. The patient was positioned over the fluoroscopy table. The area was prepped in the usual manner. The time-out was completed. The target area was identified using fluoroscopy. A 12-in long, straight, sterile hemostat was used with fluoroscopic guidance to locate the targets for each level blocked. Once located, the skin was marked with an approved surgical skin marker. Once all sites were marked, the skin (epidermis, dermis, and hypodermis), as well as deeper tissues (fat, connective tissue and muscle) were infiltrated with a small amount of a short-acting local anesthetic, loaded on a 10cc syringe with a 25G, 1.5-in  Needle. An appropriate amount of time was allowed for local anesthetics to take effect before proceeding to the next step. Local Anesthetic: Lidocaine 2.0% The unused portion of the local anesthetic was discarded in the proper designated containers. Technical explanation of process:  L2 Medial Branch Nerve Block (MBB): The target area for the L2 medial branch is at the junction of the postero-lateral aspect of the superior articular process and the superior, posterior, and medial edge of the  transverse process of L3. Under fluoroscopic guidance, a Quincke needle was inserted until contact was made with os over the superior postero-lateral aspect of the pedicular shadow (target area). After negative aspiration for blood, 0.5 mL of the nerve block solution was injected without difficulty or complication. The needle was removed intact. L3 Medial Branch Nerve Block (MBB): The target area for the L3 medial branch is at the junction of the postero-lateral aspect of the superior articular process and the superior, posterior, and medial edge of the transverse process of L4. Under fluoroscopic guidance, a Quincke needle was inserted until contact was made with os over the superior postero-lateral aspect of the pedicular shadow (target area). After negative aspiration for blood, 0.5 mL of the nerve block solution was injected without difficulty or complication. The needle was removed intact. L4 Medial Branch Nerve Block (MBB): The target area for the L4 medial branch is at the junction of the postero-lateral aspect of the superior articular process and the superior, posterior, and medial edge of the transverse process of L5. Under fluoroscopic guidance, a Quincke needle was inserted until contact was made with os over the superior postero-lateral aspect of the pedicular shadow (target area). After negative aspiration for blood, 0.5 mL of the nerve block solution was injected without difficulty or complication. The needle was removed intact. L5 Medial Branch Nerve Block (MBB): The target area for the L5 medial branch is at the junction of the postero-lateral aspect of the superior articular process and the superior, posterior, and medial edge of the sacral ala. Under fluoroscopic guidance, a Quincke needle was inserted until contact was made with os over the superior postero-lateral aspect of the pedicular shadow (target area). After negative aspiration for blood, 0.5 mL of the nerve block solution was injected  without difficulty or complication. The needle was removed intact. S1 Medial Branch Nerve Block (MBB): The target area for the S1  medial branch is at the posterior and inferior 6 o'clock position of the L5-S1 facet joint. Under fluoroscopic guidance, the Quincke needle inserted for the L5 MBB was redirected until contact was made with os over the inferior and postero aspect of the sacrum, at the 6 o' clock position under the L5-S1 facet joint (Target area). After negative aspiration for blood, 0.5 mL of the nerve block solution was injected without difficulty or complication. The needle was removed intact.  Once the entire procedure was completed, the treated area was cleaned, making sure to leave some of the prepping solution back to take advantage of its long term bactericidal properties.      Illustration of the posterior view of the lumbar spine and the posterior neural structures. Laminae of L2 through S1 are labeled. DPRL5, dorsal primary ramus of L5; DPRS1, dorsal primary ramus of S1; DPR3, dorsal primary ramus of L3; FJ, facet (zygapophyseal) joint L3-L4; I, inferior articular process of L4; LB1, lateral branch of dorsal primary ramus of L1; IAB, inferior articular branches from L3 medial branch (supplies L4-L5 facet joint); IBP, intermediate branch plexus; MB3, medial branch of dorsal primary ramus of L3; NR3, third lumbar nerve root; S, superior articular process of L5; SAB, superior articular branches from L4 (supplies L4-5 facet joint also); TP3, transverse process of L3.  Vitals:   04/27/21 1025 04/27/21 1030 04/27/21 1035 04/27/21 1039  BP: (!) 156/90 (!) 146/92 (!) 144/90 (!) 153/87  Pulse: (!) 53 (!) 56 (!) 57 60  Resp: 15 17 13 15   Temp:      TempSrc:      SpO2: 98% 99% 99% 99%  Weight:      Height:         Start Time: 1027 hrs. End Time: 1037 hrs.  Imaging Guidance (Spinal):          Type of Imaging Technique: Fluoroscopy Guidance (Spinal) Indication(s): Assistance in  needle guidance and placement for procedures requiring needle placement in or near specific anatomical locations not easily accessible without such assistance. Exposure Time: Please see nurses notes. Contrast: None used. Fluoroscopic Guidance: I was personally present during the use of fluoroscopy. "Tunnel Vision Technique" used to obtain the best possible view of the target area. Parallax error corrected before commencing the procedure. "Direction-depth-direction" technique used to introduce the needle under continuous pulsed fluoroscopy. Once target was reached, antero-posterior, oblique, and lateral fluoroscopic projection used confirm needle placement in all planes. Images permanently stored in EMR. Interpretation: No contrast injected. I personally interpreted the imaging intraoperatively. Adequate needle placement confirmed in multiple planes. Permanent images saved into the patient's record.  Antibiotic Prophylaxis:   Anti-infectives (From admission, onward)    None      Indication(s): None identified  Post-operative Assessment:  Post-procedure Vital Signs:  Pulse/HCG Rate: 60 (NSR)  Temp: (!) 97.4 F (36.3 C) Resp: 15 BP: (!) 153/87 SpO2: 99 %  EBL: None  Complications: No immediate post-treatment complications observed by team, or reported by patient.  Note: The patient tolerated the entire procedure well. A repeat set of vitals were taken after the procedure and the patient was kept under observation following institutional policy, for this type of procedure. Post-procedural neurological assessment was performed, showing return to baseline, prior to discharge. The patient was provided with post-procedure discharge instructions, including a section on how to identify potential problems. Should any problems arise concerning this procedure, the patient was given instructions to immediately contact us, at any time, without hesitation. In any case,  we plan to contact the patient by  telephone for a follow-up status report regarding this interventional procedure.  Comments:  No additional relevant information.  Plan of Care  Orders:  Orders Placed This Encounter  Procedures   LUMBAR FACET(MEDIAL BRANCH NERVE BLOCK) MBNB    Scheduling Instructions:     Procedure: Lumbar facet block (AKA.: Lumbosacral medial branch nerve block)     Side: Bilateral     Level: L3-4 & L5-S1 Facets (L2, L3, L4, L5, & S1 Medial Branch Nerves)     Sedation: Patient's choice.     Timeframe: Today    Order Specific Question:   Where will this procedure be performed?    Answer:   [facility]   DG [facility] C-ARM 1-60 MIN NO REPORT    Intraoperative interpretation by procedural physician at [facility].    Standing Status:   Standing    Number of Occurrences:   1    Order Specific Question:   Reason for exam:    Answer:   Assistance in needle guidance and placement for procedures requiring needle placement in or near specific anatomical locations not easily accessible without such assistance.   Informed Consent Details: Physician/Practitioner Attestation; Transcribe to consent form and obtain patient signature    Nursing Order: Transcribe to consent form and obtain patient signature. Note: Always confirm laterality of pain with Mr. Biel, before procedure.    Order Specific Question:   Physician/Practitioner attestation of informed consent for procedure/surgical case    Answer:   I, the physician/practitioner, attest that I have discussed with the patient the benefits, risks, side effects, alternatives, likelihood of achieving goals and potential problems during recovery for the procedure that I have provided informed consent.    Order Specific Question:   Procedure    Answer:   Lumbar Facet Block  under fluoroscopic guidance    Order Specific Question:   Physician/Practitioner performing the procedure    Answer:   Rolfe Hartsell A. Dossie Arbour MD    Order Specific Question:    Indication/Reason    Answer:   Low Back Pain, with our without leg pain, due to Facet Joint Arthralgia (Joint Pain) Spondylosis (Arthritis of the Spine), without myelopathy or radiculopathy (Nerve Damage).   Provide equipment / supplies at bedside    "Block Tray" (Disposable   single use) Needle type: SpinalSpinal Amount/quantity: 4 Size: Regular (3.5-inch) Gauge: 22G    Standing Status:   Standing    Number of Occurrences:   1    Order Specific Question:   Specify    Answer:   Block Tray   Chronic Opioid Analgesic:  Oxycodone IR 5 mg, 1 tab PO q 6 hrs (20 mg/day of oxycodone) + OxyContin 10 mg, 1 tab p.o. twice daily (20 mg/day of oxycodone) (total of 40 mg/day of oxycodone) MME/day: 60 mg/day.   Medications ordered for procedure: Meds ordered this encounter  Medications   lidocaine (XYLOCAINE) 2 % (with pres) injection 400 mg   pentafluoroprop-tetrafluoroeth (GEBAUERS) aerosol   lactated ringers infusion 1,000 mL   midazolam (VERSED) 5 MG/5ML injection 0.5-2 mg    Make sure Flumazenil is available in the pyxis when using this medication. If oversedation occurs, administer 0.2 mg IV over 15 sec. If after 45 sec no response, administer 0.2 mg again over 1 min; may repeat at 1 min intervals; not to exceed 4 doses (1 mg)   ropivacaine (PF) 2 mg/mL (0.2%) (NAROPIN) injection 18 mL   triamcinolone acetonide (  KENALOG-40) injection 80 mg   Medications administered: We administered lidocaine, pentafluoroprop-tetrafluoroeth, ropivacaine (PF) 2 mg/mL (0.2%), and triamcinolone acetonide.  See the medical record for exact dosing, route, and time of administration.  Follow-up plan:   Return in about 2 weeks (around 05/11/2021) for Proc-day (T,Th), (VV), (PPE).       Interventional Therapies  Risk   Complexity Considerations:   Estimated body mass index is 34.44 kg/m as calculated from the following:   Height as of this encounter: 5\' 10"  (1.778 m).   Weight as of this encounter: 240 lb  (108.9 kg). WNL   Planned   Pending:   Therapeutic/palliative bilateral lumbar facet MBB #3    Under consideration:   Possible intrathecal pump trial.  Possible Racz procedure #1    Completed:   Diagnostic/therapeutic bilateral lumbar facet MBB x2 (08/17/2017) (100/90/90/90)  Palliative right lumbar facet RFA x1 (01/23/18) (100/100/100/75)  Palliative left lumbar facet RFA x1 (11/21/17) (0/0/0/0)  Therapeutic left caudal ESI x1 (05/14/2019) (100/100/50)  Therapeutic bilateral erector spinae & quadratus lumborum muscle MNB/TPI x2 (04/10/2018) (100/100/0/0)   Therapeutic   Palliative (PRN) options:   Therapeutic/palliative bilateral lumbar facet MBB  Palliative right lumbar facet RFA #2  Palliative left lumbar facet RFA #2      Recent Visits Date Type Provider Dept  03/03/21 Office Visit Lance Pointer, MD [facility]  Showing recent visits within past 90 days and meeting all other requirements Today's Visits Date Type Provider Dept  04/27/21 Procedure visit Lance Pointer, MD [facility]  Showing today's visits and meeting all other requirements Future Appointments Date Type Provider Dept  05/13/21 Appointment Lance Pointer, MD [facility]  05/26/21 Appointment Lance Pointer, MD [facility]  Showing future appointments within next 90 days and meeting all other requirements  Disposition: Discharge home  Discharge (Date   Time): 04/27/2021; 1043 hrs.   Primary Care Physician: Lance Koch, MD Location: [facility] Note by: Lance Cola, MD Date: 04/27/2021; Time: 12:26 PM  Disclaimer:  Medicine is not an Chief Strategy Officer. The only guarantee in medicine is that nothing is guaranteed. It is important to note that the decision to proceed with this intervention was based on the information collected from the patient. The Data and conclusions were drawn from the patient's questionnaire,  the interview, and the physical examination. Because the information was provided in large part by the patient, it cannot be guaranteed that it has not been purposely or unconsciously manipulated. Every effort has been made to obtain as much relevant data as possible for this evaluation. It is important to note that the conclusions that lead to this procedure are derived in large part from the available data. Always take into account that the treatment will also be dependent on availability of resources and existing treatment guidelines, considered by other Pain Management Practitioners as being common knowledge and practice, at the time of the intervention. For Medico-Legal purposes, it is also important to point out that variation in procedural techniques and pharmacological choices are the acceptable norm. The indications, contraindications, technique, and results of the above procedure should only be interpreted and judged by a Board-Certified Interventional Pain Specialist with extensive familiarity and expertise in the same exact procedure and technique.

## 2021-04-27 NOTE — Patient Instructions (Signed)

## 2021-04-27 NOTE — Progress Notes (Signed)
Safety precautions to be maintained throughout the outpatient stay will include: orient to surroundings, keep bed in low position, maintain call bell within reach at all times, provide assistance with transfer out of bed and ambulation.  

## 2021-04-28 ENCOUNTER — Telehealth: Payer: Self-pay

## 2021-04-28 ENCOUNTER — Ambulatory Visit (INDEPENDENT_AMBULATORY_CARE_PROVIDER_SITE_OTHER): Payer: BC Managed Care – PPO | Admitting: Internal Medicine

## 2021-04-28 ENCOUNTER — Encounter: Payer: Self-pay | Admitting: Internal Medicine

## 2021-04-28 VITALS — BP 126/88 | HR 55 | Resp 18 | Ht 70.0 in | Wt 245.2 lb

## 2021-04-28 DIAGNOSIS — M7918 Myalgia, other site: Secondary | ICD-10-CM | POA: Diagnosis not present

## 2021-04-28 DIAGNOSIS — M47816 Spondylosis without myelopathy or radiculopathy, lumbar region: Secondary | ICD-10-CM | POA: Diagnosis not present

## 2021-04-28 DIAGNOSIS — M545 Low back pain, unspecified: Secondary | ICD-10-CM

## 2021-04-28 DIAGNOSIS — Z0000 Encounter for general adult medical examination without abnormal findings: Secondary | ICD-10-CM

## 2021-04-28 DIAGNOSIS — F1120 Opioid dependence, uncomplicated: Secondary | ICD-10-CM

## 2021-04-28 DIAGNOSIS — R252 Cramp and spasm: Secondary | ICD-10-CM | POA: Diagnosis not present

## 2021-04-28 DIAGNOSIS — M792 Neuralgia and neuritis, unspecified: Secondary | ICD-10-CM

## 2021-04-28 DIAGNOSIS — G8929 Other chronic pain: Secondary | ICD-10-CM

## 2021-04-28 DIAGNOSIS — K219 Gastro-esophageal reflux disease without esophagitis: Secondary | ICD-10-CM

## 2021-04-28 LAB — LIPID PANEL
Cholesterol: 188 mg/dL (ref 0–200)
HDL: 54.4 mg/dL (ref >39.00)
LDL Cholesterol: 120 mg/dL — ABNORMAL HIGH (ref 0–99)
NonHDL: 133.75
Total CHOL/HDL Ratio: 3
Triglycerides: 67 mg/dL (ref 0.0–149.0)
VLDL: 13.4 mg/dL (ref 0.0–40.0)

## 2021-04-28 LAB — COMPREHENSIVE METABOLIC PANEL
ALT: 20 U/L (ref 0–53)
AST: 20 U/L (ref 0–37)
Albumin: 4.7 g/dL (ref 3.5–5.2)
Alkaline Phosphatase: 71 U/L (ref 39–117)
BUN: 24 mg/dL — ABNORMAL HIGH (ref 6–23)
CO2: 29 mEq/L (ref 19–32)
Calcium: 9.4 mg/dL (ref 8.4–10.5)
Chloride: 102 mEq/L (ref 96–112)
Creatinine, Ser: 0.9 mg/dL (ref 0.40–1.50)
GFR: 93.53 mL/min (ref >60.00)
Glucose, Bld: 109 mg/dL — ABNORMAL HIGH (ref 70–99)
Potassium: 4.3 mEq/L (ref 3.5–5.1)
Sodium: 136 mEq/L (ref 135–145)
Total Bilirubin: 0.4 mg/dL (ref 0.2–1.2)
Total Protein: 7.2 g/dL (ref 6.0–8.3)

## 2021-04-28 LAB — CBC
HCT: 41.2 % (ref 39.0–52.0)
Hemoglobin: 13.7 g/dL (ref 13.0–17.0)
MCHC: 33.2 g/dL (ref 30.0–36.0)
MCV: 89.2 fl (ref 78.0–100.0)
Platelets: 217 10*3/uL (ref 150.0–400.0)
RBC: 4.62 Mil/uL (ref 4.22–5.81)
RDW: 13.3 % (ref 11.5–15.5)
WBC: 9.2 10*3/uL (ref 4.0–10.5)

## 2021-04-28 LAB — HEMOGLOBIN A1C: Hgb A1c MFr Bld: 5.9 % (ref 4.6–6.5)

## 2021-04-28 MED ORDER — GABAPENTIN 600 MG PO TABS: 1200.0000 mg | ORAL_TABLET | Freq: Every day | ORAL | 3 refills | Status: DC

## 2021-04-28 MED ORDER — CELECOXIB 200 MG PO CAPS: 200.0000 mg | ORAL_CAPSULE | Freq: Every day | ORAL | 3 refills | Status: DC

## 2021-04-28 MED ORDER — BACLOFEN 10 MG PO TABS: ORAL_TABLET | ORAL | 11 refills | Status: DC

## 2021-04-28 NOTE — Patient Instructions (Signed)
Think about doing the cologuard for colon cancer screening.

## 2021-04-28 NOTE — Assessment & Plan Note (Signed)
Refill gabapentin, baclofen and celebrex. Checking CBC and CMP for ongoing treatment.

## 2021-04-28 NOTE — Assessment & Plan Note (Signed)
Seeing pain management and stable overall.

## 2021-04-28 NOTE — Assessment & Plan Note (Signed)
Takes rare nexium. Could be related to celebrex.

## 2021-04-28 NOTE — Telephone Encounter (Signed)
Post procedure phone call.  LM 

## 2021-04-28 NOTE — Assessment & Plan Note (Signed)
Flu shot declines. Covid-19 declines. Shingrix declines. Tetanus up to date. Cologuard declines repeat. Counseled about sun safety and mole surveillance. Counseled about the dangers of distracted driving. Given 10 year screening recommendations.

## 2021-04-28 NOTE — Assessment & Plan Note (Signed)
Seeing pain management and on chronic oxycodone.

## 2021-04-28 NOTE — Assessment & Plan Note (Signed)
Refill baclofen and gabapentin and celebrex.

## 2021-04-28 NOTE — Progress Notes (Signed)
° °  Subjective:   Patient ID: Lance Roman, male    DOB: 10/02/1960, 61 y.o.   MRN: [id number]  HPI The patient is a 61 YO man coming in for physical.   PMH, [place], social history reviewed and updated  Review of Systems  Constitutional: Negative.  Negative for appetite change, fatigue, fever and unexpected weight change.  HENT: Negative.    Eyes: Negative.   Respiratory: Negative.  Negative for cough, chest tightness and shortness of breath.   Cardiovascular: Negative.  Negative for chest pain, palpitations and leg swelling.  Gastrointestinal:  Negative for abdominal distention, abdominal pain, constipation, diarrhea, nausea and vomiting.  Musculoskeletal:  Positive for back pain. Negative for arthralgias.  Skin: Negative.   Neurological: Negative.  Negative for syncope, weakness and numbness.  Psychiatric/Behavioral: Negative.     Objective:  Physical Exam Constitutional:      Appearance: He is well-developed.  HENT:     Head: Normocephalic and atraumatic.  Cardiovascular:     Rate and Rhythm: Normal rate and regular rhythm.  Pulmonary:     Effort: Pulmonary effort is normal. No respiratory distress.     Breath sounds: Normal breath sounds. No wheezing or rales.  Abdominal:     General: Bowel sounds are normal. There is no distension.     Palpations: Abdomen is soft.     Tenderness: There is no abdominal tenderness. There is no rebound.  Musculoskeletal:        General: Tenderness present.     Cervical back: Normal range of motion.  Skin:    General: Skin is warm and dry.  Neurological:     Mental Status: He is alert and oriented to person, place, and time.     Coordination: Coordination normal.    Vitals:   04/28/21 0920  BP: 126/88  Pulse: (!) 55  Resp: 18  SpO2: 98%  Weight: 245 lb 3.2 oz (111.2 kg)  Height: 5\' 10"  (1.778 m)    This visit occurred during the SARS-CoV-2 public health emergency.  Safety protocols were in place, including screening questions  prior to the visit, additional usage of staff PPE, and extensive cleaning of exam room while observing appropriate contact time as indicated for disinfecting solutions.   Assessment & Plan:

## 2021-04-29 ENCOUNTER — Telehealth: Payer: Self-pay | Admitting: Internal Medicine

## 2021-04-29 NOTE — Telephone Encounter (Signed)
See below

## 2021-04-29 NOTE — Telephone Encounter (Signed)
*   will not cover* celecoxib (CELEBREX) 200 MG capsule  *see below*

## 2021-04-29 NOTE — Telephone Encounter (Signed)
Call patient and see if he wants to buy this or switch.

## 2021-04-29 NOTE — Telephone Encounter (Signed)
Rep w/ [facility] states pt insurance will not cover   Rep states the alternative are:  Naproxen 250 mg, 375 mg, or 500 mg Meloxicam 7.5 mg or 15 mg   Rep states a new rx can be sent for one of the alternatives

## 2021-04-30 NOTE — Telephone Encounter (Signed)
See my chart message

## 2021-05-13 ENCOUNTER — Other Ambulatory Visit: Payer: Self-pay

## 2021-05-13 ENCOUNTER — Ambulatory Visit: Payer: No Typology Code available for payment source | Attending: Pain Medicine | Admitting: Pain Medicine

## 2021-05-13 DIAGNOSIS — M79605 Pain in left leg: Secondary | ICD-10-CM

## 2021-05-13 DIAGNOSIS — M545 Low back pain, unspecified: Secondary | ICD-10-CM

## 2021-05-13 DIAGNOSIS — M47816 Spondylosis without myelopathy or radiculopathy, lumbar region: Secondary | ICD-10-CM

## 2021-05-13 DIAGNOSIS — M961 Postlaminectomy syndrome, not elsewhere classified: Secondary | ICD-10-CM | POA: Diagnosis not present

## 2021-05-13 DIAGNOSIS — G894 Chronic pain syndrome: Secondary | ICD-10-CM

## 2021-05-13 DIAGNOSIS — G8929 Other chronic pain: Secondary | ICD-10-CM

## 2021-05-13 NOTE — Progress Notes (Signed)
Patient: Lance Roman  Service Category: E/M  Provider: Gaspar Cola, MD  DOB: 01-17-1961  DOS: 05/13/2021  Location: Office  MRN: [id number]  Setting: Ambulatory outpatient  Referring Provider: Hoyt Roman, *  Type: Established Patient  Specialty: Interventional Pain Management  PCP: Lance Koch, MD  Location: Remote location  Delivery: TeleHealth     Virtual Encounter - Pain Management PROVIDER NOTE: Information contained herein reflects review and annotations entered in association with encounter. Interpretation of such information and data should be left to medically-trained personnel. Information provided to patient can be located elsewhere in the medical record under "Patient Instructions". Document created using STT-dictation technology, any transcriptional errors that may result from process are unintentional.    Contact & Pharmacy Preferred: [phone] Home: [phone] (home) Mobile: [phone] (mobile) E-mail: [email or web address]  [place], [place] [address] Phone: [phone] Fax: [phone]  [id number] [id number] [place]Phone: [phone]: [phone] [facility] [place]Phone: [phone]: [phone]  Pre-screening  Lance Roman offered "in-person" vs "virtual" encounter. He indicated preferring virtual for this encounter.   Reason COVID-19*   Social distancing based on CDC and AMA recommendations.   I contacted Lance DANSEREAUon 05/13/2021 via telephone.      I clearly identified myself as FGaspar Cola MD. I verified that I was speaking with the correct person using two identifiers (Name: Lance Roman and date of birth: 807-May-1962.  Consent I sought verbal  advanced consent from SSimone Curiafor virtual visit interactions. I informed Lance Roman of possible security and privacy concerns, risks, and limitations associated with providing "not-in-person" medical evaluation and management services. I also informed Lance Roman of the availability of "in-person" appointments. Finally, I informed him that there would be a charge for the virtual visit and that he could be  personally, fully or partially, financially responsible for it. Lance Roman understanding and agreed to proceed.   Historic Elements   Mr. SULES MARSALAis a 61y.o. year old, male patient evaluated today after our last contact on 04/27/2021. Lance Roman has a past medical history of Acute postoperative pain (11/21/2017), Allergic rhinitis, Chronic pain syndrome, DDD (degenerative disc disease), lumbosacral, GERD (gastroesophageal reflux disease), Hyperlipidemia, Osteoarthritis, and S/P appendectomy. He also  has a past surgical history that includes Appendectomy; repairs of bilateral ankle fractures; Spinal fusion (N/A); and Spinal cord stimulator implant (N/A). Mr. PHungerhas a current medication list which includes the following prescription(s): baclofen, celecoxib, esomeprazole, gabapentin, ibuprofen, latanoprost, naloxone, oxycodone, and oxycodone. He  reports that he has quit smoking. His smokeless tobacco use includes chew. He reports current alcohol use of about 3.0 standard drinks per week. He reports that he does not use drugs. Lance Roman No Known Allergies.   HPI  Today, he is being contacted for a post-procedure assessment.  According to the patient he attained 100% relief of the pain for the duration of the local anesthetic which then went down to a 50% improvement before going up again to an ongoing 70% relief of his pain.  He still having some pain in his foot, but I clarified for him that the procedure that we did was primarily for the back pain.  He refers that for the  time being he is doing okay and does not need anything else.  Post-procedure evaluation    Type: Lumbar Facet, Medial Branch Block(s) #3  Laterality: Bilateral  Level: L2, L3, L4, L5, & S1 Medial Branch Level(s). Injecting these levels blocks the L3-4 and L5-S1 lumbar facet joints. Imaging: Fluoroscopic guidance Anesthesia: Local anesthesia (1-2% Lidocaine) Anxiolysis: IV Versed         Sedation: None. DOS: 04/27/2021 Performed by: Lance Cola, MD  Primary Purpose: Diagnostic/Therapeutic Indications: Low back pain severe enough to impact quality of life or function. 1. Spondylosis without myelopathy or radiculopathy, lumbosacral region   2. Lumbar facet syndrome (Bilateral) (L>R)   3. Retrolisthesis (4-5 mm) of L2/L3 and L3/L4   4. DDD (degenerative disc disease), lumbosacral   5. Lumbar facet hypertrophy (Bilateral)   6. Lumbar facet osteoarthritis (Bilateral)   7. Chronic low back pain (1ry area of Pain) (Bilateral) (L>R) w/o sciatica   8. Failed back surgical syndrome (Fusion L4-S1)   9. Spinal cord stimulator dysfunction (fractured leads)    NAS-11 Pain score:   Pre-procedure: 6 /10   Post-procedure: 0-No pain (Standing moving)/10     Effectiveness:  Initial hour after procedure: 100 %. Subsequent 4-6 hours post-procedure: 100 %. Analgesia past initial 6 hours: 50 % (lasting about 2 weeks). Ongoing improvement:  Analgesic: The patient refers that he is currently enjoying an ongoing 70% improvement of his low back pain Function: Lance Roman reports improvement in function ROM: Lance Roman reports improvement in ROM  Pharmacotherapy Assessment   Opioid Analgesic: Oxycodone IR 5 mg, 1 tab PO q 6 hrs (20 mg/day of oxycodone) + OxyContin 10 mg, 1 tab p.o. twice daily (20 mg/day of oxycodone) (total of 40 mg/day of oxycodone) MME/day: 60 mg/day.   Monitoring: Lance Roman: PDMP reviewed during this encounter.       Pharmacotherapy: No side-effects or adverse reactions  reported. Compliance: No problems identified. Effectiveness: Clinically acceptable. Plan: Refer to "POC". UDS:  Summary  Date Value Ref Range Status  08/10/2020 Note  Final    Comment:    ==================================================================== [employer] Select 13 (MW) ==================================================================== Test                             Result       Flag       Units  Drug Present and Declared for Prescription Verification   Oxycodone                      2790         EXPECTED   ng/mg creat   Oxymorphone                    496          EXPECTED   ng/mg creat   Noroxycodone                   4303         EXPECTED   ng/mg creat   Noroxymorphone                 139          EXPECTED   ng/mg creat    Sources of oxycodone are scheduled prescription medications.    Oxymorphone, noroxycodone, and noroxymorphone are expected    metabolites of oxycodone. Oxymorphone is also available as a  scheduled prescription medication.  ==================================================================== Test                      Result    Flag   Units      Ref Range   Creatinine              230              mg/dL      >=20 ==================================================================== Declared Medications:  The flagging and interpretation on this report are based on the  following declared medications.  Unexpected results may arise from  inaccuracies in the declared medications.   **Note: The testing scope of this panel includes these medications:   Oxycodone   **Note: The testing scope of this panel does not include the  following reported medications:   Baclofen (Lioresal)  Celecoxib (Celebrex)  Esomeprazole (Nexium)  Gabapentin (Neurontin)  Ibuprofen (Advil)  Naloxone (Narcan) ==================================================================== For clinical consultation, please call [phone]. ====================================================================      Laboratory Chemistry Profile   Renal Lab Results  Component Value Date   BUN 24 (H) 04/28/2021   CREATININE 0.90 04/28/2021   BCR 27 (H) 12/12/2016   GFR 93.53 04/28/2021   GFRAA 120 12/12/2016   GFRNONAA 104 12/12/2016    Hepatic Lab Results  Component Value Date   AST 20 04/28/2021   ALT 20 04/28/2021   ALBUMIN 4.7 04/28/2021   ALKPHOS 71 04/28/2021   HCVAB NEGATIVE 12/30/2014    Electrolytes Lab Results  Component Value Date   NA 136 04/28/2021   K 4.3 04/28/2021   CL 102 04/28/2021   CALCIUM 9.4 04/28/2021   MG 1.9 12/12/2016    Bone Lab Results  Component Value Date   25OHVITD1 44 12/12/2016   25OHVITD2 <1.0 12/12/2016   25OHVITD3 43 12/12/2016    Inflammation (CRP: Acute Phase) (ESR: Chronic Phase) Lab Results  Component Value Date   CRP 2.5 12/12/2016   ESRSEDRATE 10 12/12/2016         Note: Above Lab results reviewed.  Imaging  DG PAIN CLINIC C-ARM 1-60 MIN NO REPORT Fluoro was used, but no Radiologist interpretation will be provided.  Please refer to "NOTES" tab for provider progress note.  Assessment  The primary encounter diagnosis was Chronic low back pain (1ry area of Pain) (Bilateral) (L>R) w/o sciatica. Diagnoses of Lumbar facet syndrome (Bilateral) (L>R), Failed back surgical syndrome (Fusion L4-S1), Chronic lower extremity pain (2ry area of Pain) (Left), and Chronic pain syndrome were also pertinent to this visit.  Plan of Care  Problem-specific:  No problem-specific Assessment & Plan notes found for this encounter.  Lance Roman has a current medication list which includes the following long-term medication(s): baclofen, celecoxib, esomeprazole, gabapentin, oxycodone, and oxycodone.  Pharmacotherapy (Medications Ordered): No orders of the defined types were placed in this encounter.  Orders:  No orders of the defined types were placed in this  encounter.  Follow-up plan:   No follow-ups on file.     Interventional Therapies  Risk   Complexity Considerations:   Estimated body mass index is 34.44 kg/m as calculated from the following:   Height as of this encounter: 5' 10"  (1.778 m).   Weight as of this encounter: 240 lb (108.9 kg).  NOTE: Poor candidate for RFA 2ry to Hardware   Planned   Pending:      Under consideration:   Possible intrathecal pump trial.  Possible Racz procedure #1  Completed:   Diagnostic/therapeutic bilateral lumbar facet MBB x3 (04/27/2021) (100/100/50/70)  Palliative right lumbar facet RFA x1 (01/23/18) (100/100/100/75)  Palliative left lumbar facet RFA x1 (11/21/17) (0/0/0/0)  Therapeutic left caudal ESI x1 (05/14/2019) (100/100/50)  Therapeutic bilateral erector spinae & quadratus lumborum muscle MNB/TPI x2 (04/10/2018) (100/100/0/0)   Therapeutic   Palliative (PRN) options:   Therapeutic/palliative bilateral lumbar facet MBB       Recent Visits Date Type Provider Dept  04/27/21 Procedure visit Milinda Pointer, MD [facility]  03/03/21 Office Visit Milinda Pointer, MD [facility]  Showing recent visits within past 90 days and meeting all other requirements Today's Visits Date Type Provider Dept  05/13/21 Office Visit Milinda Pointer, MD [facility]  Showing today's visits and meeting all other requirements Future Appointments Date Type Provider Dept  05/26/21 Appointment Milinda Pointer, MD [facility]  Showing future appointments within next 90 days and meeting all other requirements  I discussed the assessment and treatment plan with the patient. The patient was provided an opportunity to ask questions and all were answered. The patient agreed with the plan and demonstrated an understanding of the instructions.  Patient advised to call back or seek an in-person evaluation if the symptoms or condition worsens.  Duration of  encounter: 15 minutes.  Note by: Lance Cola, MD Date: 05/13/2021; Time: 2:19 PM

## 2021-05-24 NOTE — Progress Notes (Signed)
PROVIDER NOTE: Information contained herein reflects review and annotations entered in association with encounter. Interpretation of such information and data should be left to medically-trained personnel. Information provided to patient can be located elsewhere in the medical record under "Patient Instructions". Document created using STT-dictation technology, any transcriptional errors that may result from process are unintentional.    Patient: Lance Roman  Service Category: E/M  Provider: Gaspar Cola, MD  DOB: Jun 05, 1960  DOS: 05/26/2021  Specialty: Interventional Pain Management  MRN: [id number]  Setting: Ambulatory outpatient  PCP: Lance Koch, MD  Type: Established Patient    Referring Provider: Hoyt Roman, *  Location: Office  Delivery: Face-to-face     HPI  [name], a 61 y.o. year old male, is here today because of his Chronic pain syndrome [G89.4]. [name] primary complain today is Back Pain (Mid and lower) Last encounter: My last encounter with him was on 05/13/2021. Pertinent problems: [name] has Chronic low back pain (1ry area of Pain) (Bilateral) (L>R) w/o sciatica; Chronic pain syndrome; Chronic lower extremity pain (2ry area of Pain) (Left); Chronic lumbar radicular pain (L5) (Left); Retrolisthesis (4-5 mm) of L2/L3 and L3/L4; Failed back surgical syndrome (Fusion L4-S1); Lumbar facet hypertrophy (Bilateral); Lumbar facet syndrome (Bilateral) (L>R); Spinal cord stimulator dysfunction (fractured leads); Lumbar facet osteoarthritis (Bilateral); Osteoarthritis of lumbar spine; Spondylosis without myelopathy or radiculopathy, lumbosacral region; Other specified dorsopathies, sacral and sacrococcygeal region; Chronic hip pain (Left); Chronic knee pain (Left); DDD (degenerative disc disease), lumbosacral; Neurogenic pain; Chronic upper back pain; Chronic musculoskeletal pain; Epidural fibrosis; and Abnormal CT/myelogram scan, lumbar spine  (08/28/2017) on their pertinent problem list. Pain Assessment: Severity of Chronic pain is reported as a 3 /10. Location: Back Mid, Lower/denies. Onset: More than a month ago. Quality: Aching. Timing: Constant. Modifying factor(s): walking, medication. Vitals:  height is 5' 10"  (1.778 m) and weight is 245 lb (111.1 kg). His temperature is 97.3 F (36.3 C) (abnormal). His blood pressure is 150/79 (abnormal) and his pulse is 63. His respiration is 16 and oxygen saturation is 100%.   Reason for encounter: medication management.   The patient indicates doing well with the current medication regimen. No adverse reactions or side effects reported to the medications.   The patient indicates being interested in some type of procedure to help with the pain that he is experiencing in the mid back (thoracolumbar region).  Since the caudal epidural steroid injection, he no longer has any lower extremity pain.  He refers that the left side seems to be worse.  Review of the 08/28/2017 Lumbar CT/myelogram shows bilateral subarticular narrowing at L1-2 and on the right at the L2-3 level.  It also shows slight retrolisthesis at L2-3 and L3-4 levels which seems to be exaggerated with standing the retrolisthesis seems to be worse with extension and reduced in flexion.  There disc disease with standing and extension his greatest at L3-4.  He also has facet hypertrophy at the L1-2 and L2-3 levels.  At the L2-3 level there seems to be some focal stenosis.  These upper levels could be responsible for some of the pain that he has been describing and therefore we will go ahead and schedule him to return for a left-sided L2-3 LESI #1 under fluoroscopic guidance.  RTCB: 09/04/2021 Nonopioids transferred 02/05/2020: Baclofen  Pharmacotherapy Assessment  Analgesic: Oxycodone IR 5 mg, 1 tab PO q 6 hrs (20 mg/day of oxycodone) + OxyContin 10 mg, 1 tab p.o. twice daily (20  mg/day of oxycodone) (total of 40 mg/day of  oxycodone) MME/day: 60 mg/day.   Monitoring: [place] PMP: PDMP reviewed during this encounter.       Pharmacotherapy: No side-effects or adverse reactions reported. Compliance: No problems identified. Effectiveness: Clinically acceptable.  Dewayne Shorter, RN  05/26/2021  8:06 AM  Sign when Signing Visit Nursing Pain Medication Assessment:  Safety precautions to be maintained throughout the outpatient stay will include: orient to surroundings, keep bed in low position, maintain call bell within reach at all times, provide assistance with transfer out of bed and ambulation.  Medication Inspection Compliance: Pill count conducted under aseptic conditions, in front of the patient. Neither the pills nor the bottle was removed from the patient's sight at any time. Once count was completed pills were immediately returned to the patient in their original bottle.  Medication: Oxycodone IR Pill/Patch Count:  45 of 120 pills remain Pill/Patch Appearance: Markings consistent with prescribed medication Bottle Appearance: Standard pharmacy container. Clearly labeled. Filled Date: 02 / 10 / 2023 Last Medication intake:  Today  Oxycontin 10 mg 22/60 Filled 05/07/2021 Last took today    UDS:  Summary  Date Value Ref Range Status  08/10/2020 Note  Final    Comment:    ==================================================================== [employer] (MW) ==================================================================== Test                             Result       Flag       Units  Drug Present and Declared for Prescription Verification   Oxycodone                      2790         EXPECTED   ng/mg creat   Oxymorphone                    496          EXPECTED   ng/mg creat   Noroxycodone                   4303         EXPECTED   ng/mg creat   Noroxymorphone                 139          EXPECTED   ng/mg creat    Sources of oxycodone are scheduled prescription medications.    Oxymorphone,  noroxycodone, and noroxymorphone are expected    metabolites of oxycodone. Oxymorphone is also available as a    scheduled prescription medication.  ==================================================================== Test                      Result    Flag   Units      Ref Range   Creatinine              230              mg/dL      >=20 ==================================================================== Declared Medications:  The flagging and interpretation on this report are based on the  following declared medications.  Unexpected results may arise from  inaccuracies in the declared medications.   **Note: The testing scope of this panel includes these medications:   Oxycodone   **Note: The testing scope of this panel does not include the  following reported medications:   Baclofen (Lioresal)  Celecoxib (Celebrex)  Esomeprazole (Nexium)  Gabapentin (Neurontin)  Ibuprofen (Advil)  Naloxone (Narcan) ==================================================================== For clinical consultation, please call [phone]. ====================================================================      ROS  Constitutional: Denies any fever or chills Gastrointestinal: No reported hemesis, hematochezia, vomiting, or acute GI distress Musculoskeletal: Denies any acute onset joint swelling, redness, loss of ROM, or weakness Neurological: No reported episodes of acute onset apraxia, aphasia, dysarthria, agnosia, amnesia, paralysis, loss of coordination, or loss of consciousness  Medication Review  baclofen, esomeprazole, gabapentin, ibuprofen, latanoprost, naloxone, and oxyCODONE  History Review  Allergy: Mr. Kreis has No Known Allergies. Drug: Mr. Rihn  reports no history of drug use. Alcohol:  reports current alcohol use of about 3.0 standard drinks per week. Tobacco:  reports that he has quit smoking. His smokeless tobacco use includes chew. Social: Mr. Ayotte  reports that he  has quit smoking. His smokeless tobacco use includes chew. He reports current alcohol use of about 3.0 standard drinks per week. He reports that he does not use drugs. Medical:  has a past medical history of Acute postoperative pain (11/21/2017), Allergic rhinitis, Chronic pain syndrome, DDD (degenerative disc disease), lumbosacral, GERD (gastroesophageal reflux disease), Hyperlipidemia, Osteoarthritis, and S/P appendectomy. Surgical: Mr. Furches  has a past surgical history that includes Appendectomy; repairs of bilateral ankle fractures; Spinal fusion (N/A); and Spinal cord stimulator implant (N/A). Family: family history includes Diabetes in his maternal grandmother; Heart disease in his father.  Laboratory Chemistry Profile   Renal Lab Results  Component Value Date   BUN 24 (H) 04/28/2021   CREATININE 0.90 04/28/2021   BCR 27 (H) 12/12/2016   GFR 93.53 04/28/2021   GFRAA 120 12/12/2016   GFRNONAA 104 12/12/2016    Hepatic Lab Results  Component Value Date   AST 20 04/28/2021   ALT 20 04/28/2021   ALBUMIN 4.7 04/28/2021   ALKPHOS 71 04/28/2021   HCVAB NEGATIVE 12/30/2014    Electrolytes Lab Results  Component Value Date   NA 136 04/28/2021   K 4.3 04/28/2021   CL 102 04/28/2021   CALCIUM 9.4 04/28/2021   MG 1.9 12/12/2016    Bone Lab Results  Component Value Date   25OHVITD1 44 12/12/2016   25OHVITD2 <1.0 12/12/2016   25OHVITD3 43 12/12/2016    Inflammation (CRP: Acute Phase) (ESR: Chronic Phase) Lab Results  Component Value Date   CRP 2.5 12/12/2016   ESRSEDRATE 10 12/12/2016         Note: Above Lab results reviewed.  Recent Imaging Review  DG PAIN CLINIC C-ARM 1-60 MIN NO REPORT Fluoro was used, but no Radiologist interpretation will be provided.  Please refer to "NOTES" tab for provider progress note. Note: Reviewed        Physical Exam  General appearance: Well nourished, well developed, and well hydrated. In no apparent acute distress Mental status:  Alert, oriented x 3 (person, place, & time)       Respiratory: No evidence of acute respiratory distress Eyes: PERLA Vitals: BP (!) 150/79    Pulse 63    Temp (!) 97.3 F (36.3 C)    Resp 16    Ht 5' 10"  (1.778 m)    Wt 245 lb (111.1 kg)    SpO2 100%    BMI 35.15 kg/m  BMI: Estimated body mass index is 35.15 kg/m as calculated from the following:   Height as of this encounter: 5' 10"  (1.778 m).   Weight as of this encounter: 245 lb (111.1 kg). Ideal: Ideal body  weight: 73 kg (160 lb 15 oz) Adjusted ideal body weight: 88.3 kg (194 lb 9 oz)  Assessment   Status Diagnosis  Controlled Persistent Controlled 1. Chronic pain syndrome   2. Chronic low back pain (1ry area of Pain) (Bilateral) (L>R) w/o sciatica   3. Lumbar facet syndrome (Bilateral) (L>R)   4. Chronic lower extremity pain (2ry area of Pain) (Left)   5. Failed back surgical syndrome (Fusion L4-S1)   6. Chronic upper back pain   7. Chronic musculoskeletal pain   8. Pharmacologic therapy   9. Chronic use of opiate for therapeutic purpose   10. Encounter for chronic pain management   11. Encounter for medication management   12. Abnormal CT/myelogram scan, lumbar spine (08/28/2017)      Updated Problems: Problem  Abnormal CT/myelogram scan, lumbar spine (08/28/2017)   (08/28/2017) LUMBAR CT/MYELOGRAM FINDINGS: Lumbar fusion is noted at L4-5 and L5-S1. Spinal cord stimulator enters at T12-L1. The stimulator wires are fragmented. Fusion hardware is intact.  Mild subarticular narrowing is present bilaterally at L1-2 and on the right at L2-3.  Lower lumbar nerve roots fill normally on both sides.  Slight retrolisthesis is present at L3-4 and L2-3. This is exaggerated with standing. Increased uncovering of broad-based disc protrusions is present at L2-3 and L3-4 with standing. The retrolisthesis is worse with extension and reduced in flexion. The disc disease with standing and extension is greatest at L3-4.  CT LUMBAR  MYELOGRAM FINDINGS:  The lumbar spine is imaged from T11-12 through S1-2.  Schmorl's nodes are present at T11-12 and T12-L1. Vertebral body heights are normal. There is solid fusion anteriorly and posteriorly at L4-5 and L5-S1.  Slight retrolisthesis is present at L3-4. AP alignment is otherwise anatomic.  Limited imaging of the abdomen is unremarkable.  L1-2: Facet hypertrophy is worse on the right. No significant focal disc protrusion or stenosis is present.  L2-3: A mild broad-based disc protrusion is present. Moderate facet hypertrophy is noted bilaterally. Focal stenosis is present on the supine images.  L3-4: A left paramedian central disc protrusion is present. Moderate facet hypertrophy is noted. Mild foraminal narrowing is worse on the right. The central canal is patent.  L4-5: Left laminectomy and facetectomy are noted. Solid fusion is present anteriorly and posteriorly. No residual or recurrent stenosis is present.  L5-S1: Bilateral laminectomies are noted. Solid fusion is present anteriorly and posteriorly. The nerve roots are distributed peripherally. No residual stenosis is present.  IMPRESSION: 1. Solid fusion at L4-5 and L5-S1 without residual or recurrent stenosis at these levels. 2. Adjacent level disease at L3-4. Central disc protrusion and bilateral facet hypertrophy is noted on the CT scan. Retrolisthesis is exaggerated when the patient stands and extends contributing to dynamic central canal and likely foraminal stenosis. 3. More mild retrolisthesis at L2-3 is also worse with standing and extension. 4. Mild facet hypertrophy at L1-2 without significant stenosis. 5. Spinal cord stimulator is in place. The leads are fragmented as previously noted.   Ddd (Degenerative Disc Disease), Lumbosacral    Plan of Care  Problem-specific:  No problem-specific Assessment & Plan notes found for this encounter.  Mr. WATARU MCCOWEN has a current medication list which includes  the following long-term medication(s): baclofen, esomeprazole, gabapentin, [START ON 06/06/2021] oxycodone, [START ON 07/06/2021] oxycodone, [START ON 08/05/2021] oxycodone, [START ON 06/06/2021] oxycodone, [START ON 07/06/2021] oxycodone, and [START ON 08/05/2021] oxycodone.  Pharmacotherapy (Medications Ordered): Meds ordered this encounter  Medications   oxyCODONE (OXY IR/ROXICODONE) 5 MG immediate  release tablet    Sig: Take 1 tablet (5 mg total) by mouth every 6 (six) hours as needed for severe pain. Must last 30 days.    Dispense:  120 tablet    Refill:  0    DO NOT: delete (not duplicate); no partial-fill (will deny script to complete), no refill request (F/U required). DISPENSE: 1 day early if closed on fill date. WARN: No CNS-depressants within 8 hrs of med.   oxyCODONE (OXY IR/ROXICODONE) 5 MG immediate release tablet    Sig: Take 1 tablet (5 mg total) by mouth every 6 (six) hours as needed for severe pain. Must last 30 days.    Dispense:  120 tablet    Refill:  0    DO NOT: delete (not duplicate); no partial-fill (will deny script to complete), no refill request (F/U required). DISPENSE: 1 day early if closed on fill date. WARN: No CNS-depressants within 8 hrs of med.   oxyCODONE (OXY IR/ROXICODONE) 5 MG immediate release tablet    Sig: Take 1 tablet (5 mg total) by mouth every 6 (six) hours as needed for severe pain. Must last 30 days.    Dispense:  120 tablet    Refill:  0    DO NOT: delete (not duplicate); no partial-fill (will deny script to complete), no refill request (F/U required). DISPENSE: 1 day early if closed on fill date. WARN: No CNS-depressants within 8 hrs of med.   oxyCODONE (OXYCONTIN) 10 mg 12 hr tablet    Sig: Take 1 tablet (10 mg total) by mouth every 12 (twelve) hours. Must last 30 days.    Dispense:  60 tablet    Refill:  0    DO NOT: delete (not duplicate); no partial-fill (will deny script to complete), no refill request (F/U required). DISPENSE: 1 day early if  closed on fill date. WARN: No CNS-depressants within 8 hrs of med.   oxyCODONE (OXYCONTIN) 10 mg 12 hr tablet    Sig: Take 1 tablet (10 mg total) by mouth every 12 (twelve) hours. Must last 30 days.    Dispense:  60 tablet    Refill:  0    DO NOT: delete (not duplicate); no partial-fill (will deny script to complete), no refill request (F/U required). DISPENSE: 1 day early if closed on fill date. WARN: No CNS-depressants within 8 hrs of med.   oxyCODONE (OXYCONTIN) 10 mg 12 hr tablet    Sig: Take 1 tablet (10 mg total) by mouth every 12 (twelve) hours. Must last 30 days.    Dispense:  60 tablet    Refill:  0    DO NOT: delete (not duplicate); no partial-fill (will deny script to complete), no refill request (F/U required). DISPENSE: 1 day early if closed on fill date. WARN: No CNS-depressants within 8 hrs of med.   Orders:  Orders Placed This Encounter  Procedures   Lumbar Epidural Injection    Standing Status:   Future    Standing Expiration Date:   08/26/2021    Scheduling Instructions:     Procedure: Interlaminar Lumbar Epidural Steroid injection (LESI)  L2-3     Laterality: Left-sided     Sedation: Patient's choice.     Timeframe: ASAA    Order Specific Question:   Where will this procedure be performed?    Answer:   [facility]   Follow-up plan:   Return for (Clinic) procedure: (L) L2-3 LESI #1.     Interventional Therapies  Risk  Complexity Considerations:   Estimated body mass index is 34.44 kg/m as calculated from the following:   Height as of this encounter: 5' 10"  (1.778 m).   Weight as of this encounter: 240 lb (108.9 kg).  NOTE: Poor candidate for RFA 2ry to Hardware   Planned   Pending:   Diagnostic/therapeutic left L2-3 LESI #1    Under consideration:   Diagnostic/therapeutic left L2-3 LESI #1  Diagnostic bilateral thoracolumbar facet MBB (L1-2 through L3-4) #1  Diagnostic/therapeutic bilateral L1-2 and L2-3 transforaminal ESI #1  Possible  intrathecal pump trial.  Possible Racz procedure #1    Completed:   Diagnostic/therapeutic bilateral lumbar facet MBB x3 (04/27/2021) (100/100/50/70)  Palliative right lumbar facet RFA x1 (01/23/18) (100/100/100/75)  Palliative left lumbar facet RFA x1 (11/21/17) (0/0/0/0)  Therapeutic left caudal ESI x1 (05/14/2019) (100/100/50/100) (100% relief of LEP as of 05/26/2021) (743 days) Therapeutic bilateral erector spinae & quadratus lumborum muscle MNB/TPI x2 (04/10/2018) (100/100/0/0)   Therapeutic   Palliative (PRN) options:   Therapeutic/palliative bilateral lumbar facet MBB     Recent Visits Date Type Provider Dept  05/13/21 Office Visit Milinda Pointer, MD [facility]  04/27/21 Procedure visit Milinda Pointer, MD [facility]  03/03/21 Office Visit Milinda Pointer, MD [facility]  Showing recent visits within past 90 days and meeting all other requirements Today's Visits Date Type Provider Dept  05/26/21 Office Visit Milinda Pointer, MD [facility]  Showing today's visits and meeting all other requirements Future Appointments No visits were found meeting these conditions. Showing future appointments within next 90 days and meeting all other requirements  I discussed the assessment and treatment plan with the patient. The patient was provided an opportunity to ask questions and all were answered. The patient agreed with the plan and demonstrated an understanding of the instructions.  Patient advised to call back or seek an in-person evaluation if the symptoms or condition worsens.  Duration of encounter: 57 minutes.  Note by: Lance Cola, MD Date: 05/26/2021; Time: 8:52 AM

## 2021-05-26 ENCOUNTER — Ambulatory Visit: Payer: No Typology Code available for payment source | Attending: Pain Medicine | Admitting: Pain Medicine

## 2021-05-26 ENCOUNTER — Encounter: Payer: Self-pay | Admitting: Pain Medicine

## 2021-05-26 ENCOUNTER — Other Ambulatory Visit: Payer: Self-pay

## 2021-05-26 VITALS — BP 150/79 | HR 63 | Temp 97.3°F | Resp 16 | Ht 70.0 in | Wt 245.0 lb

## 2021-05-26 DIAGNOSIS — M7918 Myalgia, other site: Secondary | ICD-10-CM | POA: Diagnosis present

## 2021-05-26 DIAGNOSIS — G8929 Other chronic pain: Secondary | ICD-10-CM

## 2021-05-26 DIAGNOSIS — M79605 Pain in left leg: Secondary | ICD-10-CM | POA: Diagnosis not present

## 2021-05-26 DIAGNOSIS — G894 Chronic pain syndrome: Secondary | ICD-10-CM | POA: Diagnosis not present

## 2021-05-26 DIAGNOSIS — Z79891 Long term (current) use of opiate analgesic: Secondary | ICD-10-CM | POA: Diagnosis present

## 2021-05-26 DIAGNOSIS — M549 Dorsalgia, unspecified: Secondary | ICD-10-CM | POA: Insufficient documentation

## 2021-05-26 DIAGNOSIS — R937 Abnormal findings on diagnostic imaging of other parts of musculoskeletal system: Secondary | ICD-10-CM | POA: Diagnosis present

## 2021-05-26 DIAGNOSIS — M961 Postlaminectomy syndrome, not elsewhere classified: Secondary | ICD-10-CM | POA: Diagnosis present

## 2021-05-26 DIAGNOSIS — M545 Low back pain, unspecified: Secondary | ICD-10-CM | POA: Diagnosis not present

## 2021-05-26 DIAGNOSIS — M47816 Spondylosis without myelopathy or radiculopathy, lumbar region: Secondary | ICD-10-CM | POA: Diagnosis not present

## 2021-05-26 DIAGNOSIS — Z79899 Other long term (current) drug therapy: Secondary | ICD-10-CM

## 2021-05-26 MED ORDER — OXYCODONE HCL ER 10 MG PO T12A: 10.0000 mg | EXTENDED_RELEASE_TABLET | Freq: Two times a day (BID) | ORAL | 0 refills | Status: DC

## 2021-05-26 MED ORDER — OXYCODONE HCL 5 MG PO TABS: 5.0000 mg | ORAL_TABLET | Freq: Four times a day (QID) | ORAL | 0 refills | Status: DC | PRN

## 2021-05-26 NOTE — Progress Notes (Signed)
Nursing Pain Medication Assessment:  ?Safety precautions to be maintained throughout the outpatient stay will include: orient to surroundings, keep bed in low position, maintain call bell within reach at all times, provide assistance with transfer out of bed and ambulation.  ?Medication Inspection Compliance: Pill count conducted under aseptic conditions, in front of the patient. Neither the pills nor the bottle was removed from the patient's sight at any time. Once count was completed pills were immediately returned to the patient in their original bottle. ? ?Medication: Oxycodone IR ?Pill/Patch Count:  45 of 120 pills remain ?Pill/Patch Appearance: Markings consistent with prescribed medication ?Bottle Appearance: Standard pharmacy container. Clearly labeled. ?Filled Date: 02 / 10 / 2023 ?Last Medication intake:  Today ? ?Oxycontin 10 mg ?22/60 ?Filled 05/07/2021 ?Last took today ?

## 2021-05-26 NOTE — Patient Instructions (Addendum)
______________________________________________________________________ ° °Preparing for Procedure with Sedation ° °NOTICE: Due to recent regulatory changes, starting on October 26, 2020, procedures requiring intravenous (IV) sedation will no longer be performed at the [facility].  These types of procedures are required to be performed at [facility] ambulatory surgery facility.  We are very sorry for the inconvenience. ° °Procedure appointments are limited to planned procedures: °No Prescription Refills. °No disability issues will be discussed. °No medication changes will be discussed. ° °Instructions: °Oral Intake: Do not eat or drink anything for at least 8 hours prior to your procedure. (Exception: Blood Pressure Medication. See below.) °Transportation: A driver is required. You may not drive yourself after the procedure. °Blood Pressure Medicine: Do not forget to take your blood pressure medicine with a sip of water the morning of the procedure. If your Diastolic (lower reading) is above 100 mmHg, elective cases will be cancelled/rescheduled. °Blood thinners: These will need to be stopped for procedures. Notify our staff if you are taking any blood thinners. Depending on which one you take, there will be specific instructions on how and when to stop it. °Diabetics on insulin: Notify the staff so that you can be scheduled 1st case in the morning. If your diabetes requires high dose insulin, take only ½ of your normal insulin dose the morning of the procedure and notify the staff that you have done so. °Preventing infections: Shower with an antibacterial soap the morning of your procedure. °Build-up your immune system: Take 1000 mg of Vitamin C with every meal (3 times a day) the day prior to your procedure. °Antibiotics: Inform the staff if you have a condition or reason that requires you to take antibiotics before dental procedures. °Pregnancy: If you are pregnant, call and cancel the procedure. °Sickness: If  you have a cold, fever, or any active infections, call and cancel the procedure. °Arrival: You must be in the facility at least 30 minutes prior to your scheduled procedure. °Children: Do not bring children with you. °Dress appropriately: Bring dark clothing that you would not mind if they get stained. °Valuables: Do not bring any jewelry or valuables. ° °Reasons to call and reschedule or cancel your procedure: (Following these recommendations will minimize the risk of a serious complication.) °Surgeries: Avoid having procedures within 2 weeks of any surgery. (Avoid for 2 weeks before or after any surgery). °Flu Shots: Avoid having procedures within 2 weeks of a flu shots. (Avoid for 2 weeks before or after immunizations). °Barium: Avoid having a procedure within 7-10 days after having had a radiological study involving the use of radiological contrast. (Myelograms, Barium swallow or enema study). °Heart attacks: Avoid any elective procedures or surgeries for the initial 6 months after a "Myocardial Infarction" (Heart Attack). °Blood thinners: It is imperative that you stop these medications before procedures. Let us know if you if you take any blood thinner.  °Infection: Avoid procedures during or within two weeks of an infection (including chest colds or gastrointestinal problems). Symptoms associated with infections include: Localized redness, fever, chills, night sweats or profuse sweating, burning sensation when voiding, cough, congestion, stuffiness, runny nose, sore throat, diarrhea, nausea, vomiting, cold or Flu symptoms, recent or current infections. It is specially important if the infection is over the area that we intend to treat. °Heart and lung problems: Symptoms that may suggest an active cardiopulmonary problem include: cough, chest pain, breathing difficulties or shortness of breath, dizziness, ankle swelling, uncontrolled high or unusually low blood pressure, and/or palpitations. If you are    experiencing any of these symptoms, cancel your procedure and contact your primary care physician for an evaluation. ° °Remember:  °Regular Business hours are:  °Monday to Thursday 8:00 AM to 4:00 PM ° °Provider's Schedule: °Francisco Naveira, MD:  °Procedure days: Tuesday and Thursday 7:30 AM to 4:00 PM ° °Bilal Lateef, MD:  °Procedure days: Monday and Wednesday 7:30 AM to 4:00 PM °______________________________________________________________________ ° ____________________________________________________________________________________________ ° °General Risks and Possible Complications ° °Patient Responsibilities: It is important that you read this as it is part of your informed consent. It is our duty to inform you of the risks and possible complications associated with treatments offered to you. It is your responsibility as a patient to read this and to ask questions about anything that is not clear or that you believe was not covered in this document. ° °Patient’s Rights: You have the right to refuse treatment. You also have the right to change your mind, even after initially having agreed to have the treatment done. However, under this last option, if you wait until the last second to change your mind, you may be charged for the materials used up to that point. ° °Introduction: Medicine is not an exact science. Everything in Medicine, including the lack of treatment(s), carries the potential for danger, harm, or loss (which is by definition: Risk). In Medicine, a complication is a secondary problem, condition, or disease that can aggravate an already existing one. All treatments carry the risk of possible complications. The fact that a side effects or complications occurs, does not imply that the treatment was conducted incorrectly. It must be clearly understood that these can happen even when everything is done following the highest safety standards. ° °No treatment: You can choose not to proceed with the  proposed treatment alternative. The “PRO(s)” would include: avoiding the risk of complications associated with the therapy. The “CON(s)” would include: not getting any of the treatment benefits. These benefits fall under one of three categories: diagnostic; therapeutic; and/or palliative. Diagnostic benefits include: getting information which can ultimately lead to improvement of the disease or symptom(s). Therapeutic benefits are those associated with the successful treatment of the disease. Finally, palliative benefits are those related to the decrease of the primary symptoms, without necessarily curing the condition (example: decreasing the pain from a flare-up of a chronic condition, such as incurable terminal cancer). ° °General Risks and Complications: These are associated to most interventional treatments. They can occur alone, or in combination. They fall under one of the following six (6) categories: no benefit or worsening of symptoms; bleeding; infection; nerve damage; allergic reactions; and/or death. °No benefits or worsening of symptoms: In Medicine there are no guarantees, only probabilities. No healthcare provider can ever guarantee that a medical treatment will work, they can only state the probability that it may. Furthermore, there is always the possibility that the condition may worsen, either directly, or indirectly, as a consequence of the treatment. °Bleeding: This is more common if the patient is taking a blood thinner, either prescription or over the counter (example: Goody Powders, Fish oil, Aspirin, Garlic, etc.), or if suffering a condition associated with impaired coagulation (example: Hemophilia, cirrhosis of the liver, low platelet counts, etc.). However, even if you do not have one on these, it can still happen. If you have any of these conditions, or take one of these drugs, make sure to notify your treating physician. °Infection: This is more common in patients with a compromised  immune system, either due to disease (example:   diabetes, cancer, human immunodeficiency virus [HIV], etc.), or due to medications or treatments (example: therapies used to treat cancer and rheumatological diseases). However, even if you do not have one on these, it can still happen. If you have any of these conditions, or take one of these drugs, make sure to notify your treating physician. °Nerve Damage: This is more common when the treatment is an invasive one, but it can also happen with the use of medications, such as those used in the treatment of cancer. The damage can occur to small secondary nerves, or to large primary ones, such as those in the spinal cord and brain. This damage may be temporary or permanent and it may lead to impairments that can range from temporary numbness to permanent paralysis and/or brain death. °Allergic Reactions: Any time a substance or material comes in contact with our body, there is the possibility of an allergic reaction. These can range from a mild skin rash (contact dermatitis) to a severe systemic reaction (anaphylactic reaction), which can result in death. °Death: In general, any medical intervention can result in death, most of the time due to an unforeseen complication. °____________________________________________________________________________________________ ____________________________________________________________________________________________ ° °Medication Rules ° °Purpose: To inform patients, and their family members, of our rules and regulations. ° °Applies to: All patients receiving prescriptions (written or electronic). ° °Pharmacy of record: Pharmacy where electronic prescriptions will be sent. If written prescriptions are taken to a different pharmacy, please inform the nursing staff. The pharmacy listed in the electronic medical record should be the one where you would like electronic prescriptions to be sent. ° °Electronic prescriptions: In compliance  with the [place]reng[place] Opioid Misuse Prevention (STOP) Act of 2017 (Session Law 2017-74/H243), effective March 28, 2018, all controlled substances must be electronically prescribed. Calling prescriptions to the pharmacy will cease to exist. ° °Prescription refills: Only during scheduled appointments. Applies to all prescriptions. ° °NOTE: The following applies primarily to controlled substances (Opioid* Pain Medications).  ° °Type of encounter (visit): For patients receiving controlled substances, face-to-face visits are required. (Not an option or up to the patient.) ° °Patient's responsibilities: °Pain Pills: Bring all pain pills to every appointment (except for procedure appointments). °Pill Bottles: Bring pills in original pharmacy bottle. Always bring the newest bottle. Bring bottle, even if empty. °Medication refills: You are responsible for knowing and keeping track of what medications you take and those you need refilled. °The day before your appointment: write a list of all prescriptions that need to be refilled. °The day of the appointment: give the list to the admitting nurse. Prescriptions will be written only during appointments. No prescriptions will be written on procedure days. °If you forget a medication: it will not be "Called in", "Faxed", or "electronically sent". You will need to get another appointment to get these prescribed. °No early refills. Do not call asking to have your prescription filled early. °Prescription Accuracy: You are responsible for carefully inspecting your prescriptions before leaving our office. Have the discharge nurse carefully go over each prescription with you, before taking them home. Make sure that your name is accurately spelled, that your address is correct. Check the name and dose of your medication to make sure it is accurate. Check the number of pills, and the written instructions to make sure they are clear and accurate. Make sure that you are given  enough medication to last until your next medication refill appointment. °Taking Medication: Take medication as prescribed. When it comes to controlled substances, taking less   pills or less frequently than prescribed is permitted and encouraged. Never take more pills than instructed. Never take medication more frequently than prescribed.  Inform other Doctors: Always inform, all of your healthcare providers, of all the medications you take. Pain Medication from other Providers: You are not allowed to accept any additional pain medication from any other Doctor or Healthcare provider. There are two exceptions to this rule. (see below) In the event that you require additional pain medication, you are responsible for notifying us, as stated below. Cough Medicine: Often these contain an opioid, such as codeine or hydrocodone. Never accept or take cough medicine containing these opioids if you are already taking an opioid* medication. The combination may cause respiratory failure and death. Medication Agreement: You are responsible for carefully reading and following our Medication Agreement. This must be signed before receiving any prescriptions from our practice. Safely store a copy of your signed Agreement. Violations to the Agreement will result in no further prescriptions. (Additional copies of our Medication Agreement are available upon request.) Laws, Rules, & Regulations: All patients are expected to follow all Federal and [employer], Rules, [employer]. Ignorance of the Laws does not constitute a valid excuse.  Illegal drugs and Controlled Substances: The use of illegal substances (including, but not limited to marijuana and its derivatives) and/or the illegal use of any controlled substances is strictly prohibited. Violation of this rule may result in the immediate and permanent discontinuation of any and all prescriptions being written by our practice. The use of any illegal substances is  prohibited. Adopted CDC guidelines & recommendations: Target dosing levels will be at or below 60 MME/day. Use of benzodiazepines** is not recommended.  Exceptions: There are only two exceptions to the rule of not receiving pain medications from other Healthcare Providers. Exception #1 (Emergencies): In the event of an emergency (i.e.: accident requiring emergency care), you are allowed to receive additional pain medication. However, you are responsible for: As soon as you are able, call our office [phone], at any time of the day or night, and leave a message stating your name, the date and nature of the emergency, and the name and dose of the medication prescribed. In the event that your call is answered by a member of our staff, make sure to document and save the date, time, and the name of the person that took your information.  Exception #2 (Planned Surgery): In the event that you are scheduled by another doctor or dentist to have any type of surgery or procedure, you are allowed (for a period no longer than 30 days), to receive additional pain medication, for the acute post-op pain. However, in this case, you are responsible for picking up a copy of our "Post-op Pain Management for Surgeons" handout, and giving it to your surgeon or dentist. This document is available at our office, and does not require an appointment to obtain it. Simply go to our office during business hours (Monday-Thursday from 8:00 AM to 4:00 PM) (Friday 8:00 AM to 12:00 Noon) or if you have a scheduled appointment with [place], prior to your surgery, and ask for it by name. In addition, you are responsible for: calling our office [phone], at any time of the day or night, and leaving a message stating your name, name of your surgeon, type of surgery, and date of procedure or surgery. Failure to comply with your responsibilities may result in termination of therapy involving the controlled substances. Medication Agreement  Violation.  Following the above rules, including your responsibilities will help you in avoiding a Medication Agreement Violation (Breaking your Pain Medication Contract).  *Opioid medications include: morphine, codeine, oxycodone, oxymorphone, hydrocodone, hydromorphone, meperidine, tramadol, tapentadol, buprenorphine, fentanyl, methadone. **Benzodiazepine medications include: diazepam (Valium), alprazolam (Xanax), clonazepam (Klonopine), lorazepam (Ativan), clorazepate (Tranxene), chlordiazepoxide (Librium), estazolam (Prosom), oxazepam (Serax), temazepam (Restoril), triazolam (Halcion) (Last updated: 12/23/2020) ____________________________________________________________________________________________  ____________________________________________________________________________________________  Medication Recommendations and Reminders  Applies to: All patients receiving prescriptions (written and/or electronic).  Medication Rules & Regulations: These rules and regulations exist for your safety and that of others. They are not flexible and neither are we. Dismissing or ignoring them will be considered "non-compliance" with medication therapy, resulting in complete and irreversible termination of such therapy. (See document titled "Medication Rules" for more details.) In all conscience, because of safety reasons, we cannot continue providing a therapy where the patient does not follow instructions.  Pharmacy of record:  Definition: This is the pharmacy where your electronic prescriptions will be sent.  We do not endorse any particular pharmacy, however, we have experienced problems with [facility] not securing enough medication supply for the community. We do not restrict you in your choice of pharmacy. However, once we write for your prescriptions, we will NOT be re-sending more prescriptions to fix restricted supply problems created by your pharmacy, or your insurance.  The pharmacy listed in  the electronic medical record should be the one where you want electronic prescriptions to be sent. If you choose to change pharmacy, simply notify our nursing staff.  Recommendations: Keep all of your pain medications in a safe place, under lock and key, even if you live alone. We will NOT replace lost, stolen, or damaged medication. After you fill your prescription, take 1 week's worth of pills and put them away in a safe place. You should keep a separate, properly labeled bottle for this purpose. The remainder should be kept in the original bottle. Use this as your primary supply, until it runs out. Once it's gone, then you know that you have 1 week's worth of medicine, and it is time to come in for a prescription refill. If you do this correctly, it is unlikely that you will ever run out of medicine. To make sure that the above recommendation works, it is very important that you make sure your medication refill appointments are scheduled at least 1 week before you run out of medicine. To do this in an effective manner, make sure that you do not leave the office without scheduling your next medication management appointment. Always ask the nursing staff to show you in your prescription , when your medication will be running out. Then arrange for the receptionist to get you a return appointment, at least 7 days before you run out of medicine. Do not wait until you have 1 or 2 pills left, to come in. This is very poor planning and does not take into consideration that we may need to cancel appointments due to bad weather, sickness, or emergencies affecting our staff. DO NOT ACCEPT A "Partial Fill": If for any reason your pharmacy does not have enough pills/tablets to completely fill or refill your prescription, do not allow for a "partial fill". The law allows the pharmacy to complete that prescription within 72 hours, without requiring a new prescription. If they do not fill the rest of your prescription  within those 72 hours, you will need a separate prescription to fill the remaining amount, which we will NOT provide. If  the reason for the partial fill is your insurance, you will need to talk to the pharmacist about payment alternatives for the remaining tablets, but again, DO NOT ACCEPT A PARTIAL FILL, unless you can trust your pharmacist to obtain the remainder of the pills within 72 hours.  Prescription refills and/or changes in medication(s):  Prescription refills, and/or changes in dose or medication, will be conducted only during scheduled medication management appointments. (Applies to both, written and electronic prescriptions.) No refills on procedure days. No medication will be changed or started on procedure days. No changes, adjustments, and/or refills will be conducted on a procedure day. Doing so will interfere with the diagnostic portion of the procedure. No phone refills. No medications will be "called into the pharmacy". No Fax refills. No weekend refills. No Holliday refills. No after hours refills.  Remember:  Business hours are:  Monday to Thursday 8:00 AM to 4:00 PM Provider's Schedule: Delano Metz, MD - Appointments are:  Medication management: Monday and Wednesday 8:00 AM to 4:00 PM Procedure day: Tuesday and Thursday 7:30 AM to 4:00 PM Edward Jolly, MD - Appointments are:  Medication management: Tuesday and Thursday 8:00 AM to 4:00 PM Procedure day: Monday and Wednesday 7:30 AM to 4:00 PM (Last update: 10/16/2019) ____________________________________________________________________________________________  ____________________________________________________________________________________________  Drug Holidays (Slow)  What is a "Drug Holiday"? Drug Holiday: is the name given to the period of time during which a patient stops taking a medication(s) for the purpose of eliminating tolerance to the drug.  Benefits Improved effectiveness of  opioids. Decreased opioid dose needed to achieve benefits. Improved pain with lesser dose.  What is tolerance? Tolerance: is the progressive decreased in effectiveness of a drug due to its repetitive use. With repetitive use, the body gets use to the medication and as a consequence, it loses its effectiveness. This is a common problem seen with opioid pain medications. As a result, a larger dose of the drug is needed to achieve the same effect that used to be obtained with a smaller dose.  How long should a "Drug Holiday" last? You should stay off of the pain medicine for at least 14 consecutive days. (2 weeks)  Should I stop the medicine "cold [place]"? No. You should always coordinate with your Pain Specialist so that he/she can provide you with the correct medication dose to make the transition as smoothly as possible.  How do I stop the medicine? Slowly. You will be instructed to decrease the daily amount of pills that you take by one (1) pill every seven (7) days. This is called a "slow downward taper" of your dose. For example: if you normally take four (4) pills per day, you will be asked to drop this dose to three (3) pills per day for seven (7) days, then to two (2) pills per day for seven (7) days, then to one (1) per day for seven (7) days, and at the end of those last seven (7) days, this is when the "Drug Holiday" would start.   Will I have withdrawals? By doing a "slow downward taper" like this one, it is unlikely that you will experience any significant withdrawal symptoms. Typically, what triggers withdrawals is the sudden stop of a high dose opioid therapy. Withdrawals can usually be avoided by slowly decreasing the dose over a prolonged period of time. If you do not follow these instructions and decide to stop your medication abruptly, withdrawals may be possible.  What are withdrawals? Withdrawals: refers to the wide range  of symptoms that occur after stopping or dramatically  reducing opiate drugs after heavy and prolonged use. Withdrawal symptoms do not occur to patients that use low dose opioids, or those who take the medication sporadically. Contrary to benzodiazepine (example: Valium, Xanax, etc.) or alcohol withdrawals (Delirium Tremens), opioid withdrawals are not lethal. Withdrawals are the physical manifestation of the body getting rid of the excess receptors.  Expected Symptoms Early symptoms of withdrawal may include: Agitation Anxiety Muscle aches Increased tearing Insomnia Runny nose Sweating Yawning  Late symptoms of withdrawal may include: Abdominal cramping Diarrhea Dilated pupils Goose bumps Nausea Vomiting  Will I experience withdrawals? Due to the slow nature of the taper, it is very unlikely that you will experience any.  What is a slow taper? Taper: refers to the gradual decrease in dose.  (Last update: 10/16/2019) ____________________________________________________________________________________________   Preparing for your procedure (without sedation) Instructions: Oral Intake: Do not eat or drink anything for at least 3 hours prior to your procedure. Transportation: Unless otherwise stated by your physician, you may drive yourself after the procedure. Blood Pressure Medicine: Take your blood pressure medicine with a sip of water the morning of the procedure. Insulin: Take only  of your normal insulin dose. Preventing infections: Shower with an antibacterial soap the morning of your procedure. Build-up your immune system: Take 1000 mg of Vitamin C with every meal (3 times a day) the day prior to your procedure. Pregnancy: If you are pregnant, call and cancel the procedure. Sickness: If you have a cold, fever, or any active infections, call and cancel the procedure. Arrival: You must be in the facility at least 30 minutes prior to your scheduled procedure. Children: Do not bring any children with you. Dress appropriately:  Bring dark clothing that you would not mind if they get stained. Valuables: Do not bring any jewelry or valuables. Procedure appointments are reserved for interventional treatments only. No Prescription Refills. No medication changes will be discussed during procedure appointments. No disability issues will be discussed.

## 2021-06-17 DIAGNOSIS — H401131 Primary open-angle glaucoma, bilateral, mild stage: Secondary | ICD-10-CM | POA: Diagnosis not present

## 2021-08-30 ENCOUNTER — Encounter: Payer: Self-pay | Admitting: Pain Medicine

## 2021-08-30 ENCOUNTER — Ambulatory Visit: Payer: No Typology Code available for payment source | Attending: Pain Medicine | Admitting: Pain Medicine

## 2021-08-30 VITALS — BP 142/82 | HR 61 | Temp 98.1°F | Ht 70.0 in | Wt 245.0 lb

## 2021-08-30 DIAGNOSIS — M545 Low back pain, unspecified: Secondary | ICD-10-CM

## 2021-08-30 DIAGNOSIS — M79605 Pain in left leg: Secondary | ICD-10-CM

## 2021-08-30 DIAGNOSIS — M7918 Myalgia, other site: Secondary | ICD-10-CM | POA: Diagnosis present

## 2021-08-30 DIAGNOSIS — M961 Postlaminectomy syndrome, not elsewhere classified: Secondary | ICD-10-CM | POA: Diagnosis present

## 2021-08-30 DIAGNOSIS — G8929 Other chronic pain: Secondary | ICD-10-CM | POA: Diagnosis present

## 2021-08-30 DIAGNOSIS — Z79891 Long term (current) use of opiate analgesic: Secondary | ICD-10-CM

## 2021-08-30 DIAGNOSIS — Z79899 Other long term (current) drug therapy: Secondary | ICD-10-CM

## 2021-08-30 DIAGNOSIS — M549 Dorsalgia, unspecified: Secondary | ICD-10-CM

## 2021-08-30 DIAGNOSIS — G894 Chronic pain syndrome: Secondary | ICD-10-CM

## 2021-08-30 DIAGNOSIS — M47816 Spondylosis without myelopathy or radiculopathy, lumbar region: Secondary | ICD-10-CM | POA: Diagnosis not present

## 2021-08-30 MED ORDER — OXYCODONE HCL 5 MG PO TABS: 5.0000 mg | ORAL_TABLET | Freq: Four times a day (QID) | ORAL | 0 refills | Status: DC | PRN

## 2021-08-30 MED ORDER — OXYCODONE HCL ER 10 MG PO T12A: 10.0000 mg | EXTENDED_RELEASE_TABLET | Freq: Two times a day (BID) | ORAL | 0 refills | Status: DC

## 2021-08-30 NOTE — Progress Notes (Signed)
Nursing Pain Medication Assessment:  Safety precautions to be maintained throughout the outpatient stay will include: orient to surroundings, keep bed in low position, maintain call bell within reach at all times, provide assistance with transfer out of bed and ambulation.  Medication Inspection Compliance: Pill count conducted under aseptic conditions, in front of the patient. Neither the pills nor the bottle was removed from the patient's sight at any time. Once count was completed pills were immediately returned to the patient in their original bottle.  Medication: Morphine ER (MSContin) Pill/Patch Count:  11 of 60 pills remain Pill/Patch Appearance: Markings consistent with prescribed medication Bottle Appearance: Standard pharmacy container. Clearly labeled. Filled Date: 5 / 44 / 2023 Last Medication intake:  Today  Oxy IR Filled 08/05/2021   pills 22 out of 120 today

## 2021-08-30 NOTE — Progress Notes (Signed)
PROVIDER NOTE: Information contained herein reflects review and annotations entered in association with encounter. Interpretation of such information and data should be left to medically-trained personnel. Information provided to patient can be located elsewhere in the medical record under "Patient Instructions". Document created using STT-dictation technology, any transcriptional errors that may result from process are unintentional.    Patient: Lance Roman  Service Category: E/M  Provider: Gaspar Cola, MD  DOB: 09/23/1960  DOS: 08/30/2021  Specialty: Interventional Pain Management  MRN: [id number]  Setting: Ambulatory outpatient  PCP: Hoyt Koch, MD  Type: Established Patient    Referring Provider: Hoyt Koch, *  Location: Office  Delivery: Face-to-face     HPI  [name], a 61 y.o. year old male, is here today because of his Chronic pain syndrome [G89.4]. [name] primary complain today is Back Pain (lower) Last encounter: My last encounter with him was on 05/26/2021. Pertinent problems: [name] has Chronic low back pain (1ry area of Pain) (Bilateral) (L>R) w/o sciatica; Chronic pain syndrome; Chronic lower extremity pain (2ry area of Pain) (Left); Chronic lumbar radicular pain (L5) (Left); Retrolisthesis (4-5 mm) of L2/L3 and L3/L4; Failed back surgical syndrome (Fusion L4-S1); Lumbar facet hypertrophy (Bilateral); Lumbar facet syndrome (Bilateral) (L>R); Spinal cord stimulator dysfunction (fractured leads); Lumbar facet osteoarthritis (Bilateral); Osteoarthritis of lumbar spine; Spondylosis without myelopathy or radiculopathy, lumbosacral region; Other specified dorsopathies, sacral and sacrococcygeal region; Chronic hip pain (Left); Chronic knee pain (Left); DDD (degenerative disc disease), lumbosacral; Neurogenic pain; Chronic upper back pain; Chronic musculoskeletal pain; Epidural fibrosis; and Abnormal CT/myelogram scan, lumbar spine (08/28/2017) on  their pertinent problem list. Pain Assessment: Severity of Chronic pain is reported as a 5 /10. Location: Back Lower/left hip down back of leg to foot. Onset: More than a month ago. Quality: Aching, Constant, Discomfort. Timing: Constant. Modifying factor(s): medication, walking, moving,. streching. Vitals:  height is _0  (1.778 m) and weight is 245 lb (111.1 kg). His temperature is 98.1 F (36.7 C). His blood pressure is 142/82 (abnormal) and his pulse is 61. His oxygen saturation is 100%.   Reason for encounter: medication management.  The patient indicates doing well with the current medication regimen. No adverse reactions or side effects reported to the medications.   The patient indicates having recurrence of his low back pain with some referred pain going down the left lower extremity.  This pain does not go all the way into the foot and therefore it is thought to be referred rather than radicular.  The patient indicates that the lumbar facet blocks usually help until he starts doing more aggressive physical activities.  Lately he has had to work on his yard and therefore this has aggravated his low back pain.  History and physical exam today are compatible with recurrence of bilateral lumbar facet pain.  UDS ordered today.   RTCB: 12/03/2021 Nonopioids transferred 02/05/2020: Baclofen  Pharmacotherapy Assessment  Analgesic: Oxycodone IR 5 mg, 1 tab PO q 6 hrs (20 mg/day of oxycodone) + OxyContin 10 mg, 1 tab p.o. twice daily (20 mg/day of oxycodone) (total of 40 mg/day of oxycodone) MME/day: 60 mg/day.   Monitoring: [place] PMP: PDMP reviewed during this encounter.       Pharmacotherapy: No side-effects or adverse reactions reported. Compliance: No problems identified. Effectiveness: Clinically acceptable.  Ignatius Specking, RN  08/30/2021  8:20 AM  Sign when Signing Visit Nursing Pain Medication Assessment:  Safety precautions to be maintained throughout the outpatient stay will  include: orient to surroundings, keep bed in low position, maintain call bell within reach at all times, provide assistance with transfer out of bed and ambulation.  Medication Inspection Compliance: Pill count conducted under aseptic conditions, in front of the patient. Neither the pills nor the bottle was removed from the patient's sight at any time. Once count was completed pills were immediately returned to the patient in their original bottle.  Medication: Morphine ER (MSContin) Pill/Patch Count:  11 of 60 pills remain Pill/Patch Appearance: Markings consistent with prescribed medication Bottle Appearance: Standard pharmacy container. Clearly labeled. Filled Date: 5 / 92 / 2023 Last Medication intake:  Today  Oxy IR Filled 08/05/2021   pills 22 out of 120 today     UDS:  Summary  Date Value Ref Range Status  08/10/2020 Note  Final    Comment:    ==================================================================== [employer] Select 13 (MW) ==================================================================== Test                             Result       Flag       Units  Drug Present and Declared for Prescription Verification   Oxycodone                      2790         EXPECTED   ng/mg creat   Oxymorphone                    496          EXPECTED   ng/mg creat   Noroxycodone                   4303         EXPECTED   ng/mg creat   Noroxymorphone                 139          EXPECTED   ng/mg creat    Sources of oxycodone are scheduled prescription medications.    Oxymorphone, noroxycodone, and noroxymorphone are expected    metabolites of oxycodone. Oxymorphone is also available as a    scheduled prescription medication.  ==================================================================== Test                      Result    Flag   Units      Ref Range   Creatinine              230              mg/dL       >=20 ==================================================================== Declared Medications:  The flagging and interpretation on this report are based on the  following declared medications.  Unexpected results may arise from  inaccuracies in the declared medications.   **Note: The testing scope of this panel includes these medications:   Oxycodone   **Note: The testing scope of this panel does not include the  following reported medications:   Baclofen (Lioresal)  Celecoxib (Celebrex)  Esomeprazole (Nexium)  Gabapentin (Neurontin)  Ibuprofen (Advil)  Naloxone (Narcan) ==================================================================== For clinical consultation, please call [phone]. ====================================================================      ROS  Constitutional: Denies any fever or chills Gastrointestinal: No reported hemesis, hematochezia, vomiting, or acute GI distress Musculoskeletal: Denies any acute onset joint swelling, redness, loss of ROM, or weakness Neurological: No reported episodes of acute onset apraxia, aphasia,  dysarthria, agnosia, amnesia, paralysis, loss of coordination, or loss of consciousness  Medication Review  baclofen, esomeprazole, gabapentin, ibuprofen, latanoprost, naloxone, and oxyCODONE  History Review  Allergy: Mr. Christen has No Known Allergies. Drug: Mr. Born  reports no history of drug use. Alcohol:  reports current alcohol use of about 3.0 standard drinks per week. Tobacco:  reports that he has quit smoking. His smokeless tobacco use includes chew. Social: Mr. Adkins  reports that he has quit smoking. His smokeless tobacco use includes chew. He reports current alcohol use of about 3.0 standard drinks per week. He reports that he does not use drugs. Medical:  has a past medical history of Acute postoperative pain (11/21/2017), Allergic rhinitis, Chronic pain syndrome, DDD (degenerative disc disease), lumbosacral, GERD  (gastroesophageal reflux disease), Hyperlipidemia, Osteoarthritis, and S/P appendectomy. Surgical: Mr. Eisen  has a past surgical history that includes Appendectomy; repairs of bilateral ankle fractures; Spinal fusion (N/A); and Spinal cord stimulator implant (N/A). Family: family history includes Diabetes in his maternal grandmother; Heart disease in his father.  Laboratory Chemistry Profile   Renal Lab Results  Component Value Date   BUN 24 (H) 04/28/2021   CREATININE 0.90 04/28/2021   BCR 27 (H) 12/12/2016   GFR 93.53 04/28/2021   GFRAA 120 12/12/2016   GFRNONAA 104 12/12/2016    Hepatic Lab Results  Component Value Date   AST 20 04/28/2021   ALT 20 04/28/2021   ALBUMIN 4.7 04/28/2021   ALKPHOS 71 04/28/2021   HCVAB NEGATIVE 12/30/2014    Electrolytes Lab Results  Component Value Date   NA 136 04/28/2021   K 4.3 04/28/2021   CL 102 04/28/2021   CALCIUM 9.4 04/28/2021   MG 1.9 12/12/2016    Bone Lab Results  Component Value Date   25OHVITD1 44 12/12/2016   25OHVITD2 <1.0 12/12/2016   25OHVITD3 43 12/12/2016    Inflammation (CRP: Acute Phase) (ESR: Chronic Phase) Lab Results  Component Value Date   CRP 2.5 12/12/2016   ESRSEDRATE 10 12/12/2016         Note: Above Lab results reviewed.  Recent Imaging Review  DG PAIN CLINIC C-ARM 1-60 MIN NO REPORT Fluoro was used, but no Radiologist interpretation will be provided.  Please refer to "NOTES" tab for provider progress note. Note: Reviewed        Physical Exam  General appearance: Well nourished, well developed, and well hydrated. In no apparent acute distress Mental status: Alert, oriented x 3 (person, place, & time)       Respiratory: No evidence of acute respiratory distress Eyes: PERLA Vitals: BP (!) 142/82   Pulse 61   Temp 98.1 F (36.7 C)   Ht _0  (1.778 m)   Wt 245 lb (111.1 kg)   SpO2 100%   BMI 35.15 kg/m  BMI: Estimated body mass index is 35.15 kg/m as calculated from the following:    Height as of this encounter: _1  (1.778 m).   Weight as of this encounter: 245 lb (111.1 kg). Ideal: Ideal body weight: 73 kg (160 lb 15 oz) Adjusted ideal body weight: 88.3 kg (194 lb 9 oz)  Assessment   Diagnosis Status  1. Chronic pain syndrome   2. Chronic low back pain (1ry area of Pain) (Bilateral) (L>R) w/o sciatica   3. Lumbar facet syndrome (Bilateral) (L>R)   4. Chronic lower extremity pain (2ry area of Pain) (Left)   5. Chronic upper back pain   6. Failed back surgical syndrome (Fusion L4-S1)  7. Chronic musculoskeletal pain   8. Pharmacologic therapy   9. Chronic use of opiate for therapeutic purpose   10. Encounter for medication management   11. Encounter for chronic pain management    Persistent Worsening Worsening   Updated Problems: No problems updated.  Plan of Care  Problem-specific:  No problem-specific Assessment & Plan notes found for this encounter.  Mr. BRENTLEY LANDFAIR has a current medication list which includes the following long-term medication(s): baclofen, gabapentin, [START ON 09/04/2021] oxycodone, [START ON 10/04/2021] oxycodone, [START ON 11/03/2021] oxycodone, [START ON 09/04/2021] oxycodone, [START ON 10/04/2021] oxycodone, [START ON 11/03/2021] oxycodone, and esomeprazole.  Pharmacotherapy (Medications Ordered): Meds ordered this encounter  Medications   oxyCODONE (OXY IR/ROXICODONE) 5 MG immediate release tablet    Sig: Take 1 tablet (5 mg total) by mouth every 6 (six) hours as needed for severe pain. Must last 30 days.    Dispense:  120 tablet    Refill:  0    DO NOT: delete (not duplicate); no partial-fill (will deny script to complete), no refill request (F/U required). DISPENSE: 1 day early if closed on fill date. WARN: No CNS-depressants within 8 hrs of med.   oxyCODONE (OXY IR/ROXICODONE) 5 MG immediate release tablet    Sig: Take 1 tablet (5 mg total) by mouth every 6 (six) hours as needed for severe pain. Must last 30 days.     Dispense:  120 tablet    Refill:  0    DO NOT: delete (not duplicate); no partial-fill (will deny script to complete), no refill request (F/U required). DISPENSE: 1 day early if closed on fill date. WARN: No CNS-depressants within 8 hrs of med.   oxyCODONE (OXY IR/ROXICODONE) 5 MG immediate release tablet    Sig: Take 1 tablet (5 mg total) by mouth every 6 (six) hours as needed for severe pain. Must last 30 days.    Dispense:  120 tablet    Refill:  0    DO NOT: delete (not duplicate); no partial-fill (will deny script to complete), no refill request (F/U required). DISPENSE: 1 day early if closed on fill date. WARN: No CNS-depressants within 8 hrs of med.   oxyCODONE (OXYCONTIN) 10 mg 12 hr tablet    Sig: Take 1 tablet (10 mg total) by mouth every 12 (twelve) hours. Must last 30 days.    Dispense:  60 tablet    Refill:  0    DO NOT: delete (not duplicate); no partial-fill (will deny script to complete), no refill request (F/U required). DISPENSE: 1 day early if closed on fill date. WARN: No CNS-depressants within 8 hrs of med.   oxyCODONE (OXYCONTIN) 10 mg 12 hr tablet    Sig: Take 1 tablet (10 mg total) by mouth every 12 (twelve) hours. Must last 30 days.    Dispense:  60 tablet    Refill:  0    DO NOT: delete (not duplicate); no partial-fill (will deny script to complete), no refill request (F/U required). DISPENSE: 1 day early if closed on fill date. WARN: No CNS-depressants within 8 hrs of med.   oxyCODONE (OXYCONTIN) 10 mg 12 hr tablet    Sig: Take 1 tablet (10 mg total) by mouth every 12 (twelve) hours. Must last 30 days.    Dispense:  60 tablet    Refill:  0    DO NOT: delete (not duplicate); no partial-fill (will deny script to complete), no refill request (F/U required). DISPENSE: 1 day early if closed  on fill date. WARN: No CNS-depressants within 8 hrs of med.   Orders:  Orders Placed This Encounter  Procedures   LUMBAR FACET(MEDIAL BRANCH NERVE BLOCK) MBNB    Standing Status:    Future    Standing Expiration Date:   11/30/2021    Scheduling Instructions:     Procedure: Lumbar facet block (AKA.: Lumbosacral medial branch nerve block)     Side: Bilateral     Level: L3-4 & L5-S1 Facets (L2, L3, L4, L5, & S1 Medial Branch Nerves)     Sedation: Patient's choice.     Timeframe: ASAA    Order Specific Question:   Where will this procedure be performed?    Answer:   [facility]   [employer] Select 13 (MW), Urine    Volume: 30 ml(s). Minimum 3 ml of urine is needed. Document temperature of fresh sample. Indications: Long term (current) use of opiate analgesic (E17.408)    Order Specific Question:   Release to patient    Answer:   Immediate   Follow-up plan:   Return for (Clinic) procedure: (B) L-FCT Blk.     Interventional Therapies  Risk  Complexity Considerations:   Estimated body mass index is 34.44 kg/m as calculated from the following:   Height as of this encounter: _0  (1.778 m).   Weight as of this encounter: 240 lb (108.9 kg).  NOTE: Poor candidate for RFA 2ry to Hardware   Planned  Pending:   Therapeutic bilateral lumbar facet MBB    Under consideration:   Diagnostic/therapeutic left L2-3 LESI #1  Diagnostic bilateral thoracolumbar facet MBB (L1-2 through L3-4) #1  Diagnostic/therapeutic bilateral L1-2 and L2-3 transforaminal ESI #1  Possible intrathecal pump trial.  Possible Racz procedure #1    Completed:   Diagnostic/therapeutic bilateral lumbar facet MBB x3 (04/27/2021) (100/100/50/70)  Palliative right lumbar facet RFA x1 (01/23/18) (100/100/100/75)  Palliative left lumbar facet RFA x1 (11/21/17) (0/0/0/0)  Therapeutic left caudal ESI x1 (05/14/2019) (100/100/50/100) (100% relief of LEP as of 05/26/2021) (743 days) Therapeutic bilateral erector spinae & quadratus lumborum muscle MNB/TPI x2 (04/10/2018) (100/100/0/0)   Therapeutic  Palliative (PRN) options:   Therapeutic/palliative bilateral lumbar facet MBB     Recent  Visits No visits were found meeting these conditions. Showing recent visits within past 90 days and meeting all other requirements Today's Visits Date Type Provider Dept  08/30/21 Office Visit Milinda Pointer, MD [facility]  Showing today's visits and meeting all other requirements Future Appointments Date Type Provider Dept  11/22/21 Appointment Milinda Pointer, MD [facility]  Showing future appointments within next 90 days and meeting all other requirements  I discussed the assessment and treatment plan with the patient. The patient was provided an opportunity to ask questions and all were answered. The patient agreed with the plan and demonstrated an understanding of the instructions.  Patient advised to call back or seek an in-person evaluation if the symptoms or condition worsens.  Duration of encounter: 30 minutes.  Note by: Gaspar Cola, MD Date: 08/30/2021; Time: 9:52 AM

## 2021-08-30 NOTE — Patient Instructions (Addendum)
______________________________________________________________________  Preparing for Procedure with Sedation  NOTICE: Due to recent regulatory changes, starting on October 26, 2020, procedures requiring intravenous (IV) sedation will no longer be performed at the [facility].  These types of procedures are required to be performed at [facility] ambulatory surgery facility.  We are very sorry for the inconvenience.  Procedure appointments are limited to planned procedures: No Prescription Refills. No disability issues will be discussed. No medication changes will be discussed.  Instructions: Oral Intake: Do not eat or drink anything for at least 8 hours prior to your procedure. (Exception: Blood Pressure Medication. See below.) Transportation: A driver is required. You may not drive yourself after the procedure. Blood Pressure Medicine: Do not forget to take your blood pressure medicine with a sip of water the morning of the procedure. If your Diastolic (lower reading) is above 100 mmHg, elective cases will be cancelled/rescheduled. Blood thinners: These will need to be stopped for procedures. Notify our staff if you are taking any blood thinners. Depending on which one you take, there will be specific instructions on how and when to stop it. Diabetics on insulin: Notify the staff so that you can be scheduled 1st case in the morning. If your diabetes requires high dose insulin, take only  of your normal insulin dose the morning of the procedure and notify the staff that you have done so. Preventing infections: Shower with an antibacterial soap the morning of your procedure. Build-up your immune system: Take 1000 mg of Vitamin C with every meal (3 times a day) the day prior to your procedure. Antibiotics: Inform the staff if you have a condition or reason that requires you to take antibiotics before dental procedures. Pregnancy: If you are pregnant, call and cancel the procedure. Sickness: If  you have a cold, fever, or any active infections, call and cancel the procedure. Arrival: You must be in the facility at least 30 minutes prior to your scheduled procedure. Children: Do not bring children with you. Dress appropriately: There is always the possibility that your clothing may get soiled. Valuables: Do not bring any jewelry or valuables.  Reasons to call and reschedule or cancel your procedure: (Following these recommendations will minimize the risk of a serious complication.) Surgeries: Avoid having procedures within 2 weeks of any surgery. (Avoid for 2 weeks before or after any surgery). Flu Shots: Avoid having procedures within 2 weeks of a flu shots. (Avoid for 2 weeks before or after immunizations). Barium: Avoid having a procedure within 7-10 days after having had a radiological study involving the use of radiological contrast. (Myelograms, Barium swallow or enema study). Heart attacks: Avoid any elective procedures or surgeries for the initial 6 months after a "Myocardial Infarction" (Heart Attack). Blood thinners: It is imperative that you stop these medications before procedures. Let us know if you if you take any blood thinner.  Infection: Avoid procedures during or within two weeks of an infection (including chest colds or gastrointestinal problems). Symptoms associated with infections include: Localized redness, fever, chills, night sweats or profuse sweating, burning sensation when voiding, cough, congestion, stuffiness, runny nose, sore throat, diarrhea, nausea, vomiting, cold or Flu symptoms, recent or current infections. It is specially important if the infection is over the area that we intend to treat. Heart and lung problems: Symptoms that may suggest an active cardiopulmonary problem include: cough, chest pain, breathing difficulties or shortness of breath, dizziness, ankle swelling, uncontrolled high or unusually low blood pressure, and/or palpitations. If you are    experiencing any of these symptoms, cancel your procedure and contact your primary care physician for an evaluation.  Remember:  Regular Business hours are:  Monday to Thursday 8:00 AM to 4:00 PM  Provider's Schedule: Cirilo Canner, MD:  Procedure days: Tuesday and Thursday 7:30 AM to 4:00 PM  Bilal Lateef, MD:  Procedure days: Monday and Wednesday 7:30 AM to 4:00 PM ______________________________________________________________________  ____________________________________________________________________________________________  General Risks and Possible Complications  Patient Responsibilities: It is important that you read this as it is part of your informed consent. It is our duty to inform you of the risks and possible complications associated with treatments offered to you. It is your responsibility as a patient to read this and to ask questions about anything that is not clear or that you believe was not covered in this document.  Patient's Rights: You have the right to refuse treatment. You also have the right to change your mind, even after initially having agreed to have the treatment done. However, under this last option, if you wait until the last second to change your mind, you may be charged for the materials used up to that point.  Introduction: Medicine is not an exact science. Everything in Medicine, including the lack of treatment(s), carries the potential for danger, harm, or loss (which is by definition: Risk). In Medicine, a complication is a secondary problem, condition, or disease that can aggravate an already existing one. All treatments carry the risk of possible complications. The fact that a side effects or complications occurs, does not imply that the treatment was conducted incorrectly. It must be clearly understood that these can happen even when everything is done following the highest safety standards.  No treatment: You can choose not to proceed with the  proposed treatment alternative. The "PRO(s)" would include: avoiding the risk of complications associated with the therapy. The "CON(s)" would include: not getting any of the treatment benefits. These benefits fall under one of three categories: diagnostic; therapeutic; and/or palliative. Diagnostic benefits include: getting information which can ultimately lead to improvement of the disease or symptom(s). Therapeutic benefits are those associated with the successful treatment of the disease. Finally, palliative benefits are those related to the decrease of the primary symptoms, without necessarily curing the condition (example: decreasing the pain from a flare-up of a chronic condition, such as incurable terminal cancer).  General Risks and Complications: These are associated to most interventional treatments. They can occur alone, or in combination. They fall under one of the following six (6) categories: no benefit or worsening of symptoms; bleeding; infection; nerve damage; allergic reactions; and/or death. No benefits or worsening of symptoms: In Medicine there are no guarantees, only probabilities. No healthcare provider can ever guarantee that a medical treatment will work, they can only state the probability that it may. Furthermore, there is always the possibility that the condition may worsen, either directly, or indirectly, as a consequence of the treatment. Bleeding: This is more common if the patient is taking a blood thinner, either prescription or over the counter (example: Goody Powders, Fish oil, Aspirin, Garlic, etc.), or if suffering a condition associated with impaired coagulation (example: Hemophilia, cirrhosis of the liver, low platelet counts, etc.). However, even if you do not have one on these, it can still happen. If you have any of these conditions, or take one of these drugs, make sure to notify your treating physician. Infection: This is more common in patients with a compromised  immune system, either due to disease (example:   diabetes, cancer, human immunodeficiency virus [HIV], etc.), or due to medications or treatments (example: therapies used to treat cancer and rheumatological diseases). However, even if you do not have one on these, it can still happen. If you have any of these conditions, or take one of these drugs, make sure to notify your treating physician. Nerve Damage: This is more common when the treatment is an invasive one, but it can also happen with the use of medications, such as those used in the treatment of cancer. The damage can occur to small secondary nerves, or to large primary ones, such as those in the spinal cord and brain. This damage may be temporary or permanent and it may lead to impairments that can range from temporary numbness to permanent paralysis and/or brain death. Allergic Reactions: Any time a substance or material comes in contact with our body, there is the possibility of an allergic reaction. These can range from a mild skin rash (contact dermatitis) to a severe systemic reaction (anaphylactic reaction), which can result in death. Death: In general, any medical intervention can result in death, most of the time due to an unforeseen complication. ____________________________________________________________________________________________ ____________________________________________________________________________________________  Medication Rules  Purpose: To inform patients, and their family members, of our rules and regulations.  Applies to: All patients receiving prescriptions (written or electronic).  Pharmacy of record: Pharmacy where electronic prescriptions will be sent. If written prescriptions are taken to a different pharmacy, please inform the nursing staff. The pharmacy listed in the electronic medical record should be the one where you would like electronic prescriptions to be sent.  Electronic prescriptions: In compliance  with the [place] Streng[place] Opioid Misuse Prevention (STOP) Act of 2017 (Session Law 2017-74/H243), effective March 28, 2018, all controlled substances must be electronically prescribed. Calling prescriptions to the pharmacy will cease to exist.  Prescription refills: Only during scheduled appointments. Applies to all prescriptions.  NOTE: The following applies primarily to controlled substances (Opioid* Pain Medications).   Type of encounter (visit): For patients receiving controlled substances, face-to-face visits are required. (Not an option or up to the patient.)  Patient's responsibilities: Pain Pills: Bring all pain pills to every appointment (except for procedure appointments). Pill Bottles: Bring pills in original pharmacy bottle. Always bring the newest bottle. Bring bottle, even if empty. Medication refills: You are responsible for knowing and keeping track of what medications you take and those you need refilled. The day before your appointment: write a list of all prescriptions that need to be refilled. The day of the appointment: give the list to the admitting nurse. Prescriptions will be written only during appointments. No prescriptions will be written on procedure days. If you forget a medication: it will not be "Called in", "Faxed", or "electronically sent". You will need to get another appointment to get these prescribed. No early refills. Do not call asking to have your prescription filled early. Prescription Accuracy: You are responsible for carefully inspecting your prescriptions before leaving our office. Have the discharge nurse carefully go over each prescription with you, before taking them home. Make sure that your name is accurately spelled, that your address is correct. Check the name and dose of your medication to make sure it is accurate. Check the number of pills, and the written instructions to make sure they are clear and accurate. Make sure that you are given  enough medication to last until your next medication refill appointment. Taking Medication: Take medication as prescribed. When it comes to controlled substances, taking less   pills or less frequently than prescribed is permitted and encouraged. Never take more pills than instructed. Never take medication more frequently than prescribed.  Inform other Doctors: Always inform, all of your healthcare providers, of all the medications you take. Pain Medication from other Providers: You are not allowed to accept any additional pain medication from any other Doctor or Healthcare provider. There are two exceptions to this rule. (see below) In the event that you require additional pain medication, you are responsible for notifying us, as stated below. Cough Medicine: Often these contain an opioid, such as codeine or hydrocodone. Never accept or take cough medicine containing these opioids if you are already taking an opioid* medication. The combination may cause respiratory failure and death. Medication Agreement: You are responsible for carefully reading and following our Medication Agreement. This must be signed before receiving any prescriptions from our practice. Safely store a copy of your signed Agreement. Violations to the Agreement will result in no further prescriptions. (Additional copies of our Medication Agreement are available upon request.) Laws, Rules, & Regulations: All patients are expected to follow all Federal and State Laws, Statutes, Rules, & Regulations. Ignorance of the Laws does not constitute a valid excuse.  Illegal drugs and Controlled Substances: The use of illegal substances (including, but not limited to marijuana and its derivatives) and/or the illegal use of any controlled substances is strictly prohibited. Violation of this rule may result in the immediate and permanent discontinuation of any and all prescriptions being written by our practice. The use of any illegal substances is  prohibited. Adopted CDC guidelines & recommendations: Target dosing levels will be at or below 60 MME/day. Use of benzodiazepines** is not recommended.  Exceptions: There are only two exceptions to the rule of not receiving pain medications from other Healthcare Providers. Exception #1 (Emergencies): In the event of an emergency (i.e.: accident requiring emergency care), you are allowed to receive additional pain medication. However, you are responsible for: As soon as you are able, call our office [phone], at any time of the day or night, and leave a message stating your name, the date and nature of the emergency, and the name and dose of the medication prescribed. In the event that your call is answered by a member of our staff, make sure to document and save the date, time, and the name of the person that took your information.  Exception #2 (Planned Surgery): In the event that you are scheduled by another doctor or dentist to have any type of surgery or procedure, you are allowed (for a period no longer than 30 days), to receive additional pain medication, for the acute post-op pain. However, in this case, you are responsible for picking up a copy of our "Post-op Pain Management for Surgeons" handout, and giving it to your surgeon or dentist. This document is available at our office, and does not require an appointment to obtain it. Simply go to our office during business hours (Monday-Thursday from 8:00 AM to 4:00 PM) (Friday 8:00 AM to 12:00 Noon) or if you have a scheduled appointment with us, prior to your surgery, and ask for it by name. In addition, you are responsible for: calling our office [phone], at any time of the day or night, and leaving a message stating your name, name of your surgeon, type of surgery, and date of procedure or surgery. Failure to comply with your responsibilities may result in termination of therapy involving the controlled substances. Medication Agreement  Violation.   Following the above rules, including your responsibilities will help you in avoiding a Medication Agreement Violation ("Breaking your Pain Medication Contract").  *Opioid medications include: morphine, codeine, oxycodone, oxymorphone, hydrocodone, hydromorphone, meperidine, tramadol, tapentadol, buprenorphine, fentanyl, methadone. **Benzodiazepine medications include: diazepam (Valium), alprazolam (Xanax), clonazepam (Klonopine), lorazepam (Ativan), clorazepate (Tranxene), chlordiazepoxide (Librium), estazolam (Prosom), oxazepam (Serax), temazepam (Restoril), triazolam (Halcion) (Last updated: 12/23/2020) ____________________________________________________________________________________________  ____________________________________________________________________________________________  Medication Recommendations and Reminders  Applies to: All patients receiving prescriptions (written and/or electronic).  Medication Rules & Regulations: These rules and regulations exist for your safety and that of others. They are not flexible and neither are we. Dismissing or ignoring them will be considered "non-compliance" with medication therapy, resulting in complete and irreversible termination of such therapy. (See document titled "Medication Rules" for more details.) In all conscience, because of safety reasons, we cannot continue providing a therapy where the patient does not follow instructions.  Pharmacy of record:  Definition: This is the pharmacy where your electronic prescriptions will be sent.  We do not endorse any particular pharmacy, however, we have experienced problems with [facility] not securing enough medication supply for the community. We do not restrict you in your choice of pharmacy. However, once we write for your prescriptions, we will NOT be re-sending more prescriptions to fix restricted supply problems created by your pharmacy, or your insurance.  The pharmacy listed in  the electronic medical record should be the one where you want electronic prescriptions to be sent. If you choose to change pharmacy, simply notify our nursing staff.  Recommendations: Keep all of your pain medications in a safe place, under lock and key, even if you live alone. We will NOT replace lost, stolen, or damaged medication. After you fill your prescription, take 1 week's worth of pills and put them away in a safe place. You should keep a separate, properly labeled bottle for this purpose. The remainder should be kept in the original bottle. Use this as your primary supply, until it runs out. Once it's gone, then you know that you have 1 week's worth of medicine, and it is time to come in for a prescription refill. If you do this correctly, it is unlikely that you will ever run out of medicine. To make sure that the above recommendation works, it is very important that you make sure your medication refill appointments are scheduled at least 1 week before you run out of medicine. To do this in an effective manner, make sure that you do not leave the office without scheduling your next medication management appointment. Always ask the nursing staff to show you in your prescription , when your medication will be running out. Then arrange for the receptionist to get you a return appointment, at least 7 days before you run out of medicine. Do not wait until you have 1 or 2 pills left, to come in. This is very poor planning and does not take into consideration that we may need to cancel appointments due to bad weather, sickness, or emergencies affecting our staff. DO NOT ACCEPT A "Partial Fill": If for any reason your pharmacy does not have enough pills/tablets to completely fill or refill your prescription, do not allow for a "partial fill". The law allows the pharmacy to complete that prescription within 72 hours, without requiring a new prescription. If they do not fill the rest of your prescription  within those 72 hours, you will need a separate prescription to fill the remaining amount, which we will NOT provide. If   the reason for the partial fill is your insurance, you will need to talk to the pharmacist about payment alternatives for the remaining tablets, but again, DO NOT ACCEPT A PARTIAL FILL, unless you can trust your pharmacist to obtain the remainder of the pills within 72 hours.  Prescription refills and/or changes in medication(s):  Prescription refills, and/or changes in dose or medication, will be conducted only during scheduled medication management appointments. (Applies to both, written and electronic prescriptions.) No refills on procedure days. No medication will be changed or started on procedure days. No changes, adjustments, and/or refills will be conducted on a procedure day. Doing so will interfere with the diagnostic portion of the procedure. No phone refills. No medications will be "called into the pharmacy". No Fax refills. No weekend refills. No Holliday refills. No after hours refills.  Remember:  Business hours are:  Monday to Thursday 8:00 AM to 4:00 PM Provider's Schedule: Viola Kinnick, MD - Appointments are:  Medication management: Monday and Wednesday 8:00 AM to 4:00 PM Procedure day: Tuesday and Thursday 7:30 AM to 4:00 PM Bilal Lateef, MD - Appointments are:  Medication management: Tuesday and Thursday 8:00 AM to 4:00 PM Procedure day: Monday and Wednesday 7:30 AM to 4:00 PM (Last update: 10/16/2019) ____________________________________________________________________________________________  ____________________________________________________________________________________________  CBD (cannabidiol) & Delta-8 (Delta-8 tetrahydrocannabinol) WARNING  Intro: Cannabidiol (CBD) and tetrahydrocannabinol (THC), are two natural compounds found in plants of the Cannabis genus. They can both be extracted from hemp or cannabis. Hemp and cannabis come  from the Cannabis sativa plant. Both compounds interact with your body's endocannabinoid system, but they have very different effects. CBD does not produce the high sensation associated with cannabis. Delta-8 tetrahydrocannabinol, also known as delta-8 THC, is a psychoactive substance found in the Cannabis sativa plant, of which marijuana and hemp are two varieties. THC is responsible for the high associated with the illicit use of marijuana.  Applicable to: All individuals currently taking or considering taking CBD (cannabidiol) and, more important, all patients taking opioid analgesic controlled substances (pain medication). (Example: oxycodone; oxymorphone; hydrocodone; hydromorphone; morphine; methadone; tramadol; tapentadol; fentanyl; buprenorphine; butorphanol; dextromethorphan; meperidine; codeine; etc.)  Legal status: CBD remains a Schedule I drug prohibited for any use. CBD is illegal with one exception. In the [place], CBD has a limited Food and Drug Administration (FDA) approval for the treatment of two specific types of epilepsy disorders. Only one CBD product has been approved by the FDA for this purpose: "Epidiolex". FDA is aware that some companies are marketing products containing cannabis and cannabis-derived compounds in ways that violate the Federal Food, Drug and Cosmetic Act (FD&C Act) and that may put the health and safety of consumers at risk. The FDA, a Federal agency, has not enforced the CBD status since 2018. UPDATE: (05/14/2021) The Drug Enforcement Agency (DEA) issued a letter stating that "delta" cannabinoids, including Delta-8-THCO and Delta-9-THCO, synthetically derived from hemp do not qualify as hemp and will be viewed as Schedule I drugs. (Schedule I drugs, substances, or chemicals are defined as drugs with no currently accepted medical use and a high potential for abuse. Some examples of Schedule I drugs are: heroin, lysergic acid diethylamide (LSD), marijuana  (cannabis), 3,4-methylenedioxymethamphetamine (ecstasy), methaqualone, and peyote.) (https://www.dea.gov)  Legality: Some manufacturers ship CBD products nationally, which is illegal. Often such products are sold online and are therefore available throughout the country. CBD is openly sold in head shops and health food stores in some states where such sales have not been explicitly legalized. Selling   unapproved products with unsubstantiated therapeutic claims is not only a violation of the law, but also can put patients at risk, as these products have not been proven to be safe or effective. Federal illegality makes it difficult to conduct research on CBD.  Reference: "FDA Regulation of Cannabis and Cannabis-Derived Products, Including Cannabidiol (CBD)" - https://www.fda.gov/news-events/public-health-focus/fda-regulation-cannabis-and-cannabis-derived-products-including-cannabidiol-cbd  Warning: CBD is not FDA approved and has not undergo the same manufacturing controls as prescription drugs.  This means that the purity and safety of available CBD may be questionable. Most of the time, despite manufacturer's claims, it is contaminated with THC (delta-9-tetrahydrocannabinol - the chemical in marijuana responsible for the "HIGH").  When this is the case, the THC contaminant will trigger a positive urine drug screen (UDS) test for Marijuana (carboxy-THC). Because a positive UDS for any illicit substance is a violation of our medication agreement, your opioid analgesics (pain medicine) may be permanently discontinued. The FDA recently put out a warning about 5 things that everyone should be aware of regarding Delta-8 THC: Delta-8 THC products have not been evaluated or approved by the FDA for safe use and may be marketed in ways that put the public health at risk. The FDA has received adverse event reports involving delta-8 THC-containing products. Delta-8 THC has psychoactive and intoxicating  effects. Delta-8 THC manufacturing often involve use of potentially harmful chemicals to create the concentrations of delta-8 THC claimed in the marketplace. The final delta-8 THC product may have potentially harmful by-products (contaminants) due to the chemicals used in the process. Manufacturing of delta-8 THC products may occur in uncontrolled or unsanitary settings, which may lead to the presence of unsafe contaminants or other potentially harmful substances. Delta-8 THC products should be kept out of the reach of children and pets.  MORE ABOUT CBD  General Information: CBD was discovered in 1940 and it is a derivative of the cannabis sativa genus plants (Marijuana and Hemp). It is one of the 113 identified substances found in Marijuana. It accounts for up to 40% of the plant's extract. As of 2018, preliminary clinical studies on CBD included research for the treatment of anxiety, movement disorders, and pain. CBD is available and consumed in multiple forms, including inhalation of smoke or vapor, as an aerosol spray, and by mouth. It may be supplied as an oil containing CBD, capsules, dried cannabis, or as a liquid solution. CBD is thought not to be as psychoactive as THC (delta-9-tetrahydrocannabinol - the chemical in marijuana responsible for the "HIGH"). Studies suggest that CBD may interact with different biological target receptors in the body, including cannabinoid and other neurotransmitter receptors. As of 2018 the mechanism of action for its biological effects has not been determined.  Side-effects  Adverse reactions: Dry mouth, diarrhea, decreased appetite, fatigue, drowsiness, malaise, weakness, sleep disturbances, and others.  Drug interactions: CBC may interact with other medications such as blood-thinners. Because CBD causes drowsiness on its own, it also increases the drowsiness caused by other medications, including antihistamines (such as Benadryl), benzodiazepines (Xanax, Ativan,  Valium), antipsychotics, antidepressants and opioids, as well as alcohol and supplements such as kava, melatonin and St. John's Wort. Be cautious with the following combinations:   Brivaracetam (Briviact) Brivaracetam is changed and broken down by the body. CBD might decrease how quickly the body breaks down brivaracetam. This might increase levels of brivaracetam in the body.  Caffeine Caffeine is changed and broken down by the body. CBD might decrease how quickly the body breaks down caffeine. This might increase levels of caffeine   in the body.  Carbamazepine (Tegretol) Carbamazepine is changed and broken down by the body. CBD might decrease how quickly the body breaks down carbamazepine. This might increase levels of carbamazepine in the body and increase its side effects.  Citalopram (Celexa) Citalopram is changed and broken down by the body. CBD might decrease how quickly the body breaks down citalopram. This might increase levels of citalopram in the body and increase its side effects.  Clobazam (Onfi) Clobazam is changed and broken down by the liver. CBD might decrease how quickly the liver breaks down clobazam. This might increase the effects and side effects of clobazam.  Eslicarbazepine (Aptiom) Eslicarbazepine is changed and broken down by the body. CBD might decrease how quickly the body breaks down eslicarbazepine. This might increase levels of eslicarbazepine in the body by a small amount.  Everolimus (Zostress) Everolimus is changed and broken down by the body. CBD might decrease how quickly the body breaks down everolimus. This might increase levels of everolimus in the body.  Lithium Taking higher doses of CBD might increase levels of lithium. This can increase the risk of lithium toxicity.  Medications changed by the liver (Cytochrome P450 1A1 (CYP1A1) substrates) Some medications are changed and broken down by the liver. CBD might change how quickly the liver breaks down  these medications. This could change the effects and side effects of these medications.  Medications changed by the liver (Cytochrome P450 1A2 (CYP1A2) substrates) Some medications are changed and broken down by the liver. CBD might change how quickly the liver breaks down these medications. This could change the effects and side effects of these medications.  Medications changed by the liver (Cytochrome P450 1B1 (CYP1B1) substrates) Some medications are changed and broken down by the liver. CBD might change how quickly the liver breaks down these medications. This could change the effects and side effects of these medications.  Medications changed by the liver (Cytochrome P450 2A6 (CYP2A6) substrates) Some medications are changed and broken down by the liver. CBD might change how quickly the liver breaks down these medications. This could change the effects and side effects of these medications.  Medications changed by the liver (Cytochrome P450 2B6 (CYP2B6) substrates) Some medications are changed and broken down by the liver. CBD might change how quickly the liver breaks down these medications. This could change the effects and side effects of these medications.  Medications changed by the liver (Cytochrome P450 2C19 (CYP2C19) substrates) Some medications are changed and broken down by the liver. CBD might change how quickly the liver breaks down these medications. This could change the effects and side effects of these medications.  Medications changed by the liver (Cytochrome P450 2C8 (CYP2C8) substrates) Some medications are changed and broken down by the liver. CBD might change how quickly the liver breaks down these medications. This could change the effects and side effects of these medications.  Medications changed by the liver (Cytochrome P450 2C9 (CYP2C9) substrates) Some medications are changed and broken down by the liver. CBD might change how quickly the liver breaks down these  medications. This could change the effects and side effects of these medications.  Medications changed by the liver (Cytochrome P450 2D6 (CYP2D6) substrates) Some medications are changed and broken down by the liver. CBD might change how quickly the liver breaks down these medications. This could change the effects and side effects of these medications.  Medications changed by the liver (Cytochrome P450 2E1 (CYP2E1) substrates) Some medications are changed and broken down   by the liver. CBD might change how quickly the liver breaks down these medications. This could change the effects and side effects of these medications.  Medications changed by the liver (Cytochrome P450 3A4 (CYP3A4) substrates) Some medications are changed and broken down by the liver. CBD might change how quickly the liver breaks down these medications. This could change the effects and side effects of these medications.  Medications changed by the liver (Glucuronidated drugs) Some medications are changed and broken down by the liver. CBD might change how quickly the liver breaks down these medications. This could change the effects and side effects of these medications.  Medications that decrease the breakdown of other medications by the liver (Cytochrome P450 2C19 (CYP2C19) inhibitors) CBD is changed and broken down by the liver. Some drugs decrease how quickly the liver changes and breaks down CBD. This could change the effects and side effects of CBD.  Medications that decrease the breakdown of other medications in the liver (Cytochrome P450 3A4 (CYP3A4) inhibitors) CBD is changed and broken down by the liver. Some drugs decrease how quickly the liver changes and breaks down CBD. This could change the effects and side effects of CBD.  Medications that increase breakdown of other medications by the liver (Cytochrome P450 3A4 (CYP3A4) inducers) CBD is changed and broken down by the liver. Some drugs increase how quickly the  liver changes and breaks down CBD. This could change the effects and side effects of CBD.  Medications that increase the breakdown of other medications by the liver (Cytochrome P450 2C19 (CYP2C19) inducers) CBD is changed and broken down by the liver. Some drugs increase how quickly the liver changes and breaks down CBD. This could change the effects and side effects of CBD.  Methadone (Dolophine) Methadone is broken down by the liver. CBD might decrease how quickly the liver breaks down methadone. Taking cannabidiol along with methadone might increase the effects and side effects of methadone.  Rufinamide (Banzel) Rufinamide is changed and broken down by the body. CBD might decrease how quickly the body breaks down rufinamide. This might increase levels of rufinamide in the body by a small amount.  Sedative medications (CNS depressants) CBD might cause sleepiness and slowed breathing. Some medications, called sedatives, can also cause sleepiness and slowed breathing. Taking CBD with sedative medications might cause breathing problems and/or too much sleepiness.  Sirolimus (Rapamune) Sirolimus is changed and broken down by the body. CBD might decrease how quickly the body breaks down sirolimus. This might increase levels of sirolimus in the body.  Stiripentol (Diacomit) Stiripentol is changed and broken down by the body. CBD might decrease how quickly the body breaks down stiripentol. This might increase levels of stiripentol in the body and increase its side effects.  Tacrolimus (Prograf) Tacrolimus is changed and broken down by the body. CBD might decrease how quickly the body breaks down tacrolimus. This might increase levels of tacrolimus in the body.  Tamoxifen (Soltamox) Tamoxifen is changed and broken down by the body. CBD might affect how quickly the body breaks down tamoxifen. This might affect levels of tamoxifen in the body.  Topiramate (Topamax) Topiramate is changed and broken  down by the body. CBD might decrease how quickly the body breaks down topiramate. This might increase levels of topiramate in the body by a small amount.  Valproate Valproic acid can cause liver injury. Taking cannabidiol with valproic acid might increase the chance of liver injury. CBD and/or valproic acid might need to be stopped, or   the dose might need to be reduced.  Warfarin (Coumadin) CBD might increase levels of warfarin, which can increase the risk for bleeding. CBD and/or warfarin might need to be stopped, or the dose might need to be reduced.  Zonisamide Zonisamide is changed and broken down by the body. CBD might decrease how quickly the body breaks down zonisamide. This might increase levels of zonisamide in the body by a small amount. (Last update: 05/26/2021) ____________________________________________________________________________________________  ____________________________________________________________________________________________  Drug Holidays (Slow)  What is a "Drug Holiday"? Drug Holiday: is the name given to the period of time during which a patient stops taking a medication(s) for the purpose of eliminating tolerance to the drug.  Benefits Improved effectiveness of opioids. Decreased opioid dose needed to achieve benefits. Improved pain with lesser dose.  What is tolerance? Tolerance: is the progressive decreased in effectiveness of a drug due to its repetitive use. With repetitive use, the body gets use to the medication and as a consequence, it loses its effectiveness. This is a common problem seen with opioid pain medications. As a result, a larger dose of the drug is needed to achieve the same effect that used to be obtained with a smaller dose.  How long should a "Drug Holiday" last? You should stay off of the pain medicine for at least 14 consecutive days. (2 weeks)  Should I stop the medicine "cold turkey"? No. You should always coordinate with  your Pain Specialist so that he/she can provide you with the correct medication dose to make the transition as smoothly as possible.  How do I stop the medicine? Slowly. You will be instructed to decrease the daily amount of pills that you take by one (1) pill every seven (7) days. This is called a "slow downward taper" of your dose. For example: if you normally take four (4) pills per day, you will be asked to drop this dose to three (3) pills per day for seven (7) days, then to two (2) pills per day for seven (7) days, then to one (1) per day for seven (7) days, and at the end of those last seven (7) days, this is when the "Drug Holiday" would start.   Will I have withdrawals? By doing a "slow downward taper" like this one, it is unlikely that you will experience any significant withdrawal symptoms. Typically, what triggers withdrawals is the sudden stop of a high dose opioid therapy. Withdrawals can usually be avoided by slowly decreasing the dose over a prolonged period of time. If you do not follow these instructions and decide to stop your medication abruptly, withdrawals may be possible.  What are withdrawals? Withdrawals: refers to the wide range of symptoms that occur after stopping or dramatically reducing opiate drugs after heavy and prolonged use. Withdrawal symptoms do not occur to patients that use low dose opioids, or those who take the medication sporadically. Contrary to benzodiazepine (example: Valium, Xanax, etc.) or alcohol withdrawals ("Delirium Tremens"), opioid withdrawals are not lethal. Withdrawals are the physical manifestation of the body getting rid of the excess receptors.  Expected Symptoms Early symptoms of withdrawal may include: Agitation Anxiety Muscle aches Increased tearing Insomnia Runny nose Sweating Yawning  Late symptoms of withdrawal may include: Abdominal cramping Diarrhea Dilated pupils Goose bumps Nausea Vomiting  Will I experience  withdrawals? Due to the slow nature of the taper, it is very unlikely that you will experience any.  What is a slow taper? Taper: refers to the gradual decrease in dose.  (  Last update: 10/16/2019) ____________________________________________________________________________________________    

## 2021-09-02 LAB — TOXASSURE SELECT 13 (MW), URINE

## 2021-09-04 ENCOUNTER — Other Ambulatory Visit: Payer: Self-pay | Admitting: Pain Medicine

## 2021-09-04 DIAGNOSIS — Z79891 Long term (current) use of opiate analgesic: Secondary | ICD-10-CM

## 2021-09-04 DIAGNOSIS — G8929 Other chronic pain: Secondary | ICD-10-CM

## 2021-09-04 DIAGNOSIS — M961 Postlaminectomy syndrome, not elsewhere classified: Secondary | ICD-10-CM

## 2021-09-04 DIAGNOSIS — M47816 Spondylosis without myelopathy or radiculopathy, lumbar region: Secondary | ICD-10-CM

## 2021-09-04 DIAGNOSIS — Z79899 Other long term (current) drug therapy: Secondary | ICD-10-CM

## 2021-09-04 DIAGNOSIS — G894 Chronic pain syndrome: Secondary | ICD-10-CM

## 2021-09-23 DIAGNOSIS — H401131 Primary open-angle glaucoma, bilateral, mild stage: Secondary | ICD-10-CM | POA: Diagnosis not present

## 2021-11-02 ENCOUNTER — Other Ambulatory Visit: Payer: Self-pay | Admitting: Pain Medicine

## 2021-11-02 DIAGNOSIS — Z79891 Long term (current) use of opiate analgesic: Secondary | ICD-10-CM

## 2021-11-02 DIAGNOSIS — M961 Postlaminectomy syndrome, not elsewhere classified: Secondary | ICD-10-CM

## 2021-11-02 DIAGNOSIS — G8929 Other chronic pain: Secondary | ICD-10-CM

## 2021-11-02 DIAGNOSIS — M47816 Spondylosis without myelopathy or radiculopathy, lumbar region: Secondary | ICD-10-CM

## 2021-11-02 DIAGNOSIS — G894 Chronic pain syndrome: Secondary | ICD-10-CM

## 2021-11-02 DIAGNOSIS — Z79899 Other long term (current) drug therapy: Secondary | ICD-10-CM

## 2021-11-21 NOTE — Progress Notes (Unsigned)
PROVIDER NOTE: Information contained herein reflects review and annotations entered in association with encounter. Interpretation of such information and data should be left to medically-trained personnel. Information provided to patient can be located elsewhere in the medical record under "Patient Instructions". Document created using STT-dictation technology, any transcriptional errors that may result from process are unintentional.    Patient: Lance Roman  Service Category: E/M  Provider: Gaspar Cola, MD  DOB: 1961/01/09  DOS: 11/22/2021  Referring Provider: Hoyt Koch, *  MRN: [id number]  Specialty: Interventional Pain Management  PCP: Hoyt Koch, MD  Type: Established Patient  Setting: Ambulatory outpatient    Location: Office  Delivery: Face-to-face     HPI  [name], a 61 y.o. year old male, is here today because of his No primary diagnosis found.. Mr. Denz primary complain today is No chief complaint on file. Last encounter: My last encounter with him was on 11/02/2021. Pertinent problems: Mr. Mccaster has Chronic low back pain (1ry area of Pain) (Bilateral) (L>R) w/o sciatica; Chronic pain syndrome; Chronic lower extremity pain (2ry area of Pain) (Left); Chronic lumbar radicular pain (L5) (Left); Retrolisthesis (4-5 mm) of L2/L3 and L3/L4; Failed back surgical syndrome (Fusion L4-S1); Lumbar facet hypertrophy (Bilateral); Lumbar facet syndrome (Bilateral) (L>R); Spinal cord stimulator dysfunction (fractured leads); Lumbar facet osteoarthritis (Bilateral); Osteoarthritis of lumbar spine; Spondylosis without myelopathy or radiculopathy, lumbosacral region; Other specified dorsopathies, sacral and sacrococcygeal region; Chronic hip pain (Left); Chronic knee pain (Left); DDD (degenerative disc disease), lumbosacral; Neurogenic pain; Chronic upper back pain; Chronic musculoskeletal pain; Epidural fibrosis; and Abnormal CT/myelogram scan, lumbar spine  (08/28/2017) on their pertinent problem list. Pain Assessment: Severity of   is reported as a  /10. Location:    / . Onset:  . Quality:  . Timing:  . Modifying factor(s):  Marland Kitchen Vitals:  vitals were not taken for this visit.   Reason for encounter:  *** . ***  Pharmacotherapy Assessment  Analgesic: Oxycodone IR 5 mg, 1 tab PO q 6 hrs (20 mg/day of oxycodone) + OxyContin 10 mg, 1 tab p.o. twice daily (20 mg/day of oxycodone) (total of 40 mg/day of oxycodone) MME/day: 60 mg/day.   Monitoring: [place] PMP: PDMP reviewed during this encounter.       Pharmacotherapy: No side-effects or adverse reactions reported. Compliance: No problems identified. Effectiveness: Clinically acceptable.  No notes on file  No results found for: "CBDTHCR" No results found for: "D8THCCBX" No results found for: "D9THCCBX"  UDS:  Summary  Date Value Ref Range Status  08/30/2021 Note  Final    Comment:    ==================================================================== [facility] (MW) ==================================================================== Test                             Result       Flag       Units  Drug Present and Declared for Prescription Verification   Oxycodone                      5213         EXPECTED   ng/mg creat   Oxymorphone                    1250         EXPECTED   ng/mg creat   Noroxycodone                   >  5587        EXPECTED   ng/mg creat   Noroxymorphone                 443          EXPECTED   ng/mg creat    Sources of oxycodone are scheduled prescription medications.    Oxymorphone, noroxycodone, and noroxymorphone are expected    metabolites of oxycodone. Oxymorphone is also available as a    scheduled prescription medication.  ==================================================================== Test                      Result    Flag   Units      Ref Range   Creatinine              179              mg/dL       >=20 ==================================================================== Declared Medications:  The flagging and interpretation on this report are based on the  following declared medications.  Unexpected results may arise from  inaccuracies in the declared medications.   **Note: The testing scope of this panel includes these medications:   Oxycodone (OxyIR)   **Note: The testing scope of this panel does not include the  following reported medications:   Baclofen (Lioresal)  Esomeprazole (Nexium)  Gabapentin (Neurontin)  Ibuprofen (Advil)  Latanoprost (Xalatan)  Naloxone (Narcan) ==================================================================== For clinical consultation, please call [phone]. ====================================================================       ROS  Constitutional: Denies any fever or chills Gastrointestinal: No reported hemesis, hematochezia, vomiting, or acute GI distress Musculoskeletal: Denies any acute onset joint swelling, redness, loss of ROM, or weakness Neurological: No reported episodes of acute onset apraxia, aphasia, dysarthria, agnosia, amnesia, paralysis, loss of coordination, or loss of consciousness  Medication Review  baclofen, esomeprazole, gabapentin, ibuprofen, latanoprost, and oxyCODONE  History Review  Allergy: Mr. Vondrasek has No Known Allergies. Drug: Mr. Killgore  reports no history of drug use. Alcohol:  reports current alcohol use of about 3.0 standard drinks of alcohol per week. Tobacco:  reports that he has quit smoking. His smokeless tobacco use includes chew. Social: Mr. Prater  reports that he has quit smoking. His smokeless tobacco use includes chew. He reports current alcohol use of about 3.0 standard drinks of alcohol per week. He reports that he does not use drugs. Medical:  has a past medical history of Acute postoperative pain (11/21/2017), Allergic rhinitis, Chronic pain syndrome, DDD (degenerative disc  disease), lumbosacral, GERD (gastroesophageal reflux disease), Hyperlipidemia, Osteoarthritis, and S/P appendectomy. Surgical: Mr. Zeiss  has a past surgical history that includes Appendectomy; repairs of bilateral ankle fractures; Spinal fusion (N/A); and Spinal cord stimulator implant (N/A). Family: family history includes Diabetes in his maternal grandmother; Heart disease in his father.  Laboratory Chemistry Profile   Renal Lab Results  Component Value Date   BUN 24 (H) 04/28/2021   CREATININE 0.90 04/28/2021   BCR 27 (H) 12/12/2016   GFR 93.53 04/28/2021   GFRAA 120 12/12/2016   GFRNONAA 104 12/12/2016    Hepatic Lab Results  Component Value Date   AST 20 04/28/2021   ALT 20 04/28/2021   ALBUMIN 4.7 04/28/2021   ALKPHOS 71 04/28/2021   HCVAB NEGATIVE 12/30/2014    Electrolytes Lab Results  Component Value Date   NA 136 04/28/2021   K 4.3 04/28/2021   CL 102 04/28/2021   CALCIUM 9.4 04/28/2021   MG 1.9 12/12/2016    Bone  Lab Results  Component Value Date   25OHVITD1 44 12/12/2016   25OHVITD2 <1.0 12/12/2016   25OHVITD3 43 12/12/2016    Inflammation (CRP: Acute Phase) (ESR: Chronic Phase) Lab Results  Component Value Date   CRP 2.5 12/12/2016   ESRSEDRATE 10 12/12/2016         Note: Above Lab results reviewed.  Recent Imaging Review  DG PAIN CLINIC C-ARM 1-60 MIN NO REPORT Fluoro was used, but no Radiologist interpretation will be provided.  Please refer to "NOTES" tab for provider progress note. Note: Reviewed        Physical Exam  General appearance: Well nourished, well developed, and well hydrated. In no apparent acute distress Mental status: Alert, oriented x 3 (person, place, & time)       Respiratory: No evidence of acute respiratory distress Eyes: PERLA Vitals: There were no vitals taken for this visit. BMI: Estimated body mass index is 35.15 kg/m as calculated from the following:   Height as of 08/30/21: 5' 10"  (1.778 m).   Weight as of  08/30/21: 245 lb (111.1 kg). Ideal: Patient weight not recorded  Assessment   Diagnosis Status  No diagnosis found. Controlled Controlled Controlled   Updated Problems: No problems updated.  Plan of Care  Problem-specific:  No problem-specific Assessment & Plan notes found for this encounter.  Mr. RANDEN KAUTH has a current medication list which includes the following long-term medication(s): baclofen, esomeprazole, gabapentin, oxycodone, oxycodone, oxycodone, oxycodone, oxycodone, and oxycodone.  Pharmacotherapy (Medications Ordered): No orders of the defined types were placed in this encounter.  Orders:  No orders of the defined types were placed in this encounter.  Follow-up plan:   No follow-ups on file.     Interventional Therapies  Risk  Complexity Considerations:   Estimated body mass index is 34.44 kg/m as calculated from the following:   Height as of this encounter: 5' 10"  (1.778 m).   Weight as of this encounter: 240 lb (108.9 kg).  NOTE: Poor candidate for RFA 2ry to Hardware   Planned  Pending:   Therapeutic bilateral lumbar facet MBB    Under consideration:   Diagnostic/therapeutic left L2-3 LESI #1  Diagnostic bilateral thoracolumbar facet MBB (L1-2 through L3-4) #1  Diagnostic/therapeutic bilateral L1-2 and L2-3 transforaminal ESI #1  Possible intrathecal pump trial.  Possible Racz procedure #1    Completed:   Diagnostic/therapeutic bilateral lumbar facet MBB x3 (04/27/2021) (100/100/50/70)  Palliative right lumbar facet RFA x1 (01/23/18) (100/100/100/75)  Palliative left lumbar facet RFA x1 (11/21/17) (0/0/0/0)  Therapeutic left caudal ESI x1 (05/14/2019) (100/100/50/100) (100% relief of LEP as of 05/26/2021) (743 days) Therapeutic bilateral erector spinae & quadratus lumborum muscle MNB/TPI x2 (04/10/2018) (100/100/0/0)   Therapeutic  Palliative (PRN) options:   Therapeutic/palliative bilateral lumbar facet MBB      Recent Visits Date Type  Provider Dept  08/30/21 Office Visit Milinda Pointer, MD [facility]  Showing recent visits within past 90 days and meeting all other requirements Future Appointments Date Type Provider Dept  11/22/21 Appointment Milinda Pointer, MD [facility]  Showing future appointments within next 90 days and meeting all other requirements  I discussed the assessment and treatment plan with the patient. The patient was provided an opportunity to ask questions and all were answered. The patient agreed with the plan and demonstrated an understanding of the instructions.  Patient advised to call back or seek an in-person evaluation if the symptoms or condition worsens.  Duration of encounter: *** minutes.  Total time on encounter, as per AMA guidelines included both the face-to-face and non-face-to-face time personally spent by the physician and/or other qualified health care professional(s) on the day of the encounter (includes time in activities that require the physician or other qualified health care professional and does not include time in activities normally performed by clinical staff). Physician's time may include the following activities when performed: preparing to see the patient (eg, review of tests, pre-charting review of records) obtaining and/or reviewing separately obtained history performing a medically appropriate examination and/or evaluation counseling and educating the patient/family/caregiver ordering medications, tests, or procedures referring and communicating with other health care professionals (when not separately reported) documenting clinical information in the electronic or other health record independently interpreting results (not separately reported) and communicating results to the patient/ family/caregiver care coordination (not separately reported)  Note by: Gaspar Cola, MD Date: 11/22/2021; Time: 8:45 AM

## 2021-11-22 ENCOUNTER — Encounter: Payer: Self-pay | Admitting: Pain Medicine

## 2021-11-22 ENCOUNTER — Ambulatory Visit: Payer: BC Managed Care – PPO | Attending: Pain Medicine | Admitting: Pain Medicine

## 2021-11-22 VITALS — BP 149/80 | HR 61 | Temp 97.2°F | Ht 70.0 in | Wt 245.0 lb

## 2021-11-22 DIAGNOSIS — M79605 Pain in left leg: Secondary | ICD-10-CM | POA: Insufficient documentation

## 2021-11-22 DIAGNOSIS — G894 Chronic pain syndrome: Secondary | ICD-10-CM

## 2021-11-22 DIAGNOSIS — M7918 Myalgia, other site: Secondary | ICD-10-CM | POA: Diagnosis not present

## 2021-11-22 DIAGNOSIS — M545 Low back pain, unspecified: Secondary | ICD-10-CM | POA: Insufficient documentation

## 2021-11-22 DIAGNOSIS — M549 Dorsalgia, unspecified: Secondary | ICD-10-CM | POA: Insufficient documentation

## 2021-11-22 DIAGNOSIS — M961 Postlaminectomy syndrome, not elsewhere classified: Secondary | ICD-10-CM | POA: Diagnosis not present

## 2021-11-22 DIAGNOSIS — Z79899 Other long term (current) drug therapy: Secondary | ICD-10-CM

## 2021-11-22 DIAGNOSIS — Z79891 Long term (current) use of opiate analgesic: Secondary | ICD-10-CM | POA: Diagnosis not present

## 2021-11-22 DIAGNOSIS — G8929 Other chronic pain: Secondary | ICD-10-CM

## 2021-11-22 DIAGNOSIS — M47816 Spondylosis without myelopathy or radiculopathy, lumbar region: Secondary | ICD-10-CM

## 2021-11-22 MED ORDER — OXYCODONE HCL ER 10 MG PO T12A: 10.0000 mg | EXTENDED_RELEASE_TABLET | Freq: Two times a day (BID) | ORAL | 0 refills | Status: DC

## 2021-11-22 MED ORDER — OXYCODONE HCL 5 MG PO TABS: 5.0000 mg | ORAL_TABLET | Freq: Four times a day (QID) | ORAL | 0 refills | Status: DC | PRN

## 2021-11-22 NOTE — Progress Notes (Signed)
Safety precautions to be maintained throughout the outpatient stay will include: orient to surroundings, keep bed in low position, maintain call bell within reach at all times, provide assistance with transfer out of bed and ambulation. Nursing Pain Medication Assessment:  Safety precautions to be maintained throughout the outpatient stay will include: orient to surroundings, keep bed in low position, maintain call bell within reach at all times, provide assistance with transfer out of bed and ambulation.  Medication Inspection Compliance: Pill count conducted under aseptic conditions, in front of the patient. Neither the pills nor the bottle was removed from the patient's sight at any time. Once count was completed pills were immediately returned to the patient in their original bottle.  Medication #1: Oxycodone ER (OxyContin) Pill/Patch Count:  22 of 60 pills remain Pill/Patch Appearance: Markings consistent with prescribed medication Bottle Appearance: Standard pharmacy container. Clearly labeled. Filled Date: 8 / 9 / 2023 Last Medication intake:  Today  Medication #2: Oxycodone IR Pill/Patch Count: 47 out 120 Pill/Patch Appearance: Markings consistent with prescribed medication Bottle Appearance: Standard pharmacy container. Clearly labeled. Filled Date: 8 / 9 / 2023 Last Medication intake:  Today

## 2021-11-22 NOTE — Patient Instructions (Signed)

## 2021-12-27 DIAGNOSIS — H5213 Myopia, bilateral: Secondary | ICD-10-CM | POA: Diagnosis not present

## 2021-12-27 DIAGNOSIS — H52203 Unspecified astigmatism, bilateral: Secondary | ICD-10-CM | POA: Diagnosis not present

## 2021-12-27 DIAGNOSIS — H401131 Primary open-angle glaucoma, bilateral, mild stage: Secondary | ICD-10-CM | POA: Diagnosis not present

## 2021-12-27 DIAGNOSIS — H524 Presbyopia: Secondary | ICD-10-CM | POA: Diagnosis not present

## 2022-01-01 ENCOUNTER — Other Ambulatory Visit: Payer: Self-pay | Admitting: Pain Medicine

## 2022-01-01 DIAGNOSIS — Z79899 Other long term (current) drug therapy: Secondary | ICD-10-CM

## 2022-01-01 DIAGNOSIS — M545 Low back pain, unspecified: Secondary | ICD-10-CM

## 2022-01-01 DIAGNOSIS — M961 Postlaminectomy syndrome, not elsewhere classified: Secondary | ICD-10-CM

## 2022-01-01 DIAGNOSIS — M47816 Spondylosis without myelopathy or radiculopathy, lumbar region: Secondary | ICD-10-CM

## 2022-01-01 DIAGNOSIS — G8929 Other chronic pain: Secondary | ICD-10-CM

## 2022-01-01 DIAGNOSIS — G894 Chronic pain syndrome: Secondary | ICD-10-CM

## 2022-01-01 DIAGNOSIS — M7918 Myalgia, other site: Secondary | ICD-10-CM

## 2022-01-01 DIAGNOSIS — Z79891 Long term (current) use of opiate analgesic: Secondary | ICD-10-CM

## 2022-02-19 NOTE — Progress Notes (Signed)
PROVIDER NOTE: Information contained herein reflects review and annotations entered in association with encounter. Interpretation of such information and data should be left to medically-trained personnel. Information provided to patient can be located elsewhere in the medical record under "Patient Instructions". Document created using STT-dictation technology, any transcriptional errors that may result from process are unintentional.    Patient: Lance Roman  Service Category: E/M  Provider: Gaspar Cola, MD  DOB: [birth year]  DOS: 02/21/2022  Referring Provider: Hoyt Koch, *  MRN: [id number]  Specialty: Interventional Pain Management  PCP: Hoyt Koch, MD  Type: Established Patient  Setting: Ambulatory outpatient    Location: Office  Delivery: Face-to-face     HPI  [name], a 61 y.o. year old male, is here today because of his Chronic pain syndrome [G89.4]. Mr. Stiehl primary complain today is Back Pain (Lower and mid) Last encounter: My last encounter with him was on 01/01/2022. Pertinent problems: Mr. Homan has Chronic low back pain (1ry area of Pain) (Bilateral) (L>R) w/o sciatica; Chronic pain syndrome; Chronic lower extremity pain (2ry area of Pain) (Left); Chronic lumbar radicular pain (L5) (Left); Retrolisthesis (4-5 mm) of L2/L3 and L3/L4; Failed back surgical syndrome (Fusion L4-S1); Lumbar facet hypertrophy (Bilateral); Lumbar facet syndrome (Bilateral) (L>R); Spinal cord stimulator dysfunction (fractured leads); Lumbar facet osteoarthritis (Bilateral); Osteoarthritis of lumbar spine; Spondylosis without myelopathy or radiculopathy, lumbosacral region; Other specified dorsopathies, sacral and sacrococcygeal region; Chronic hip pain (Left); Chronic knee pain (Left); DDD (degenerative disc disease), lumbosacral; Neurogenic pain; Chronic upper back pain; Chronic musculoskeletal pain; Epidural fibrosis; and Abnormal CT/myelogram scan, lumbar spine  (08/28/2017) on their pertinent problem list. Pain Assessment: Severity of Chronic pain is reported as a 6 /10. Location: Back Right, Left/pain radiaities down both leg, left is the worse. Onset: More than a month ago. Quality: Aching, Burning, Constant, Nagging. Timing: Constant. Modifying factor(s): Meds, rest, heat, ice. Vitals:  height is _0  (1.778 m) and weight is 245 lb (111.1 kg). His temperature is 97.2 F (36.2 C) (abnormal). His blood pressure is 142/79 (abnormal) and his pulse is 65. His oxygen saturation is 97%.   Reason for encounter: medication management.  The patient indicates doing well with the current medication regimen. No adverse reactions or side effects reported to the medications.   The patient comes into the clinic today indicating increased pain in the lower back.  He states that the lower back pain is the primary pain but he also has some referred pain down both legs with the left leg going down to mid calf and on the right side the pain is just in the buttocks area.  This would suggest referred pain from the facet area.  Today I have offered the patient a steroid pack, but he is concerned about continuing to gain weight.  In addition to that I have also offered the possibility of some lumbar facet blocks.  Today he indicated that when he had the radiofrequency ablation he did gain a considerable amount of relief.  However, he states that he might have provided me with the wrong information with regards to the lumbar radiofrequency on the left side.  Initially he had indicated no relief of the pain however, he now tells me that this information was possibly not accurate since he might have been misinterpreted what he was experiencing.  (Postprocedural discomfort).  He has indicated that rather than getting the steroid pack, he rather have the radiofrequency ablation repeated.  In addition to  this, today we talked about the Racz procedure as well as the spinal cord  stimulator.  Since his primary pain is in the lower back, I have informed him that the Racz procedure would probably be better if he was having radicular pain, which right now he does not seem to be having problems with that.  However, with regards to his spinal cord stimulator, his leads are fractured and the device is not working.  For the longest time we have offered him to send him for evaluation and replacement, but he is rather on decisive.  Today he has decided to proceed with the evaluation for possible replacement.   RTCB: 06/01/2022  Nonopioids transferred 02/05/2020: Baclofen  Pharmacotherapy Assessment  Analgesic: Oxycodone IR 5 mg, 1 tab PO q 6 hrs (20 mg/day of oxycodone) + OxyContin 10 mg, 1 tab p.o. twice daily (20 mg/day of oxycodone) (total of 40 mg/day of oxycodone) MME/day: 60 mg/day.   Monitoring: [place] PMP: PDMP reviewed during this encounter.       Pharmacotherapy: No side-effects or adverse reactions reported. Compliance: No problems identified. Effectiveness: Clinically acceptable.  [name], RN  02/21/2022  8:13 AM  Sign when Signing Visit Nursing Pain Medication Assessment:  Safety precautions to be maintained throughout the outpatient stay will include: orient to surroundings, keep bed in low position, maintain call bell within reach at all times, provide assistance with transfer out of bed and ambulation.  Nursing Pain Medication Assessment:  Safety precautions to be maintained throughout the outpatient stay will include: orient to surroundings, keep bed in low position, maintain call bell within reach at all times, provide assistance with transfer out of bed and ambulation.  Medication Inspection Compliance: Pill count conducted under aseptic conditions, in front of the patient. Neither the pills nor the bottle was removed from the patient's sight at any time. Once count was completed pills were immediately returned to the patient in their original  bottle.  Medication #1: Oxycodone ER (OxyContin) Pill/Patch Count:  20 of 60 pills remain Pill/Patch Appearance: Markings consistent with prescribed medication Bottle Appearance: Standard pharmacy container. Clearly labeled. Filled Date: 82 / 7 / 2023 Last Medication intake:  Today  Medication #2: Oxycodone IR Pill/Patch Count:  42 of 120 pills remain Pill/Patch Appearance: Markings consistent with prescribed medication Bottle Appearance: Standard pharmacy container. Clearly labeled. Filled Date: 46 / 7 / 2023 Last Medication intake:  Today    No results found for: "CBDTHCR" No results found for: "D8THCCBX" No results found for: "D9THCCBX"  UDS:  Summary  Date Value Ref Range Status  08/30/2021 Note  Final    Comment:    ==================================================================== [employer] Select 13 (MW) ==================================================================== Test                             Result       Flag       Units  Drug Present and Declared for Prescription Verification   Oxycodone                      5213         EXPECTED   ng/mg creat   Oxymorphone                    1250         EXPECTED   ng/mg creat   Noroxycodone                   >  5587        EXPECTED   ng/mg creat   Noroxymorphone                 443          EXPECTED   ng/mg creat    Sources of oxycodone are scheduled prescription medications.    Oxymorphone, noroxycodone, and noroxymorphone are expected    metabolites of oxycodone. Oxymorphone is also available as a    scheduled prescription medication.  ==================================================================== Test                      Result    Flag   Units      Ref Range   Creatinine              179              mg/dL      >=20 ==================================================================== Declared Medications:  The flagging and interpretation on this report are based on the  following declared medications.   Unexpected results may arise from  inaccuracies in the declared medications.   **Note: The testing scope of this panel includes these medications:   Oxycodone (OxyIR)   **Note: The testing scope of this panel does not include the  following reported medications:   Baclofen (Lioresal)  Esomeprazole (Nexium)  Gabapentin (Neurontin)  Ibuprofen (Advil)  Latanoprost (Xalatan)  Naloxone (Narcan) ==================================================================== For clinical consultation, please call [phone]. ====================================================================       ROS  Constitutional: Denies any fever or chills Gastrointestinal: No reported hemesis, hematochezia, vomiting, or acute GI distress Musculoskeletal: Denies any acute onset joint swelling, redness, loss of ROM, or weakness Neurological: No reported episodes of acute onset apraxia, aphasia, dysarthria, agnosia, amnesia, paralysis, loss of coordination, or loss of consciousness  Medication Review  baclofen, esomeprazole, gabapentin, ibuprofen, latanoprost, naloxone, and oxyCODONE  History Review  Allergy: Mr. Carlisi has No Known Allergies. Drug: Mr. Schicker  reports no history of drug use. Alcohol:  reports current alcohol use of about 3.0 standard drinks of alcohol per week. Tobacco:  reports that he has quit smoking. His smokeless tobacco use includes chew. Social: Mr. Dunlap  reports that he has quit smoking. His smokeless tobacco use includes chew. He reports current alcohol use of about 3.0 standard drinks of alcohol per week. He reports that he does not use drugs. Medical:  has a past medical history of Acute postoperative pain (11/21/2017), Allergic rhinitis, Chronic pain syndrome, DDD (degenerative disc disease), lumbosacral, GERD (gastroesophageal reflux disease), Hyperlipidemia, Osteoarthritis, and S/P appendectomy. Surgical: Mr. Pezzullo  has a past surgical history that includes Appendectomy;  repairs of bilateral ankle fractures; Spinal fusion (N/A); and Spinal cord stimulator implant (N/A). Family: family history includes Diabetes in his maternal grandmother; Heart disease in his father.  Laboratory Chemistry Profile   Renal Lab Results  Component Value Date   BUN 24 (H) 04/28/2021   CREATININE 0.90 04/28/2021   BCR 27 (H) 12/12/2016   GFR 93.53 04/28/2021   GFRAA 120 12/12/2016   GFRNONAA 104 12/12/2016    Hepatic Lab Results  Component Value Date   AST 20 04/28/2021   ALT 20 04/28/2021   ALBUMIN 4.7 04/28/2021   ALKPHOS 71 04/28/2021   HCVAB NEGATIVE 12/30/2014    Electrolytes Lab Results  Component Value Date   NA 136 04/28/2021   K 4.3 04/28/2021   CL 102 04/28/2021   CALCIUM 9.4 04/28/2021   MG 1.9 12/12/2016  Bone Lab Results  Component Value Date   25OHVITD1 44 12/12/2016   25OHVITD2 <1.0 12/12/2016   25OHVITD3 43 12/12/2016    Inflammation (CRP: Acute Phase) (ESR: Chronic Phase) Lab Results  Component Value Date   CRP 2.5 12/12/2016   ESRSEDRATE 10 12/12/2016         Note: Above Lab results reviewed.  Recent Imaging Review  DG PAIN CLINIC C-ARM 1-60 MIN NO REPORT Fluoro was used, but no Radiologist interpretation will be provided.  Please refer to "NOTES" tab for provider progress note. Note: Reviewed        Physical Exam  General appearance: Well nourished, well developed, and well hydrated. In no apparent acute distress Mental status: Alert, oriented x 3 (person, place, & time)       Respiratory: No evidence of acute respiratory distress Eyes: PERLA Vitals: BP (!) 142/79   Pulse 65   Temp (!) 97.2 F (36.2 C)   Ht _0  (1.778 m)   Wt 245 lb (111.1 kg)   SpO2 97%   BMI 35.15 kg/m  BMI: Estimated body mass index is 35.15 kg/m as calculated from the following:   Height as of this encounter: _1  (1.778 m).   Weight as of this encounter: 245 lb (111.1 kg). Ideal: Ideal body weight: 73 kg (160 lb 15 oz) Adjusted ideal  body weight: 88.3 kg (194 lb 9 oz)  Assessment   Diagnosis Status  1. Chronic pain syndrome   2. Chronic low back pain (1ry area of Pain) (Bilateral) (L>R) w/o sciatica   3. Chronic lower extremity pain (2ry area of Pain) (Left)   4. Failed back surgical syndrome (Fusion L4-S1)   5. Lumbar facet syndrome (Bilateral) (L>R)   6. Chronic upper back pain   7. Chronic musculoskeletal pain   8. Pharmacologic therapy   9. Chronic use of opiate for therapeutic purpose   10. Encounter for medication management   11. Encounter for chronic pain management   12. Lumbar facet osteoarthritis (Bilateral)   13. Lumbar facet hypertrophy (Bilateral)   14. Spondylosis without myelopathy or radiculopathy, lumbosacral region   15. Spinal cord stimulator dysfunction (fractured leads)    Controlled Controlled Controlled   Updated Problems: No problems updated.  Plan of Care  Problem-specific:  No problem-specific Assessment & Plan notes found for this encounter.  Mr. CAMDON SAETERN has a current medication list which includes the following long-term medication(s): baclofen, gabapentin, esomeprazole, [START ON 03/03/2022] oxycodone, [START ON 04/02/2022] oxycodone, [START ON 05/02/2022] oxycodone, [START ON 03/03/2022] oxycodone, [START ON 04/02/2022] oxycodone, and [START ON 05/02/2022] oxycodone.  Pharmacotherapy (Medications Ordered): Meds ordered this encounter  Medications   oxyCODONE (OXY IR/ROXICODONE) 5 MG immediate release tablet    Sig: Take 1 tablet (5 mg total) by mouth every 6 (six) hours as needed for severe pain. Must last 30 days.    Dispense:  120 tablet    Refill:  0    DO NOT: delete (not duplicate); no partial-fill (will deny script to complete), no refill request (F/U required). DISPENSE: 1 day early if closed on fill date. WARN: No CNS-depressants within 8 hrs of med.   oxyCODONE (OXY IR/ROXICODONE) 5 MG immediate release tablet    Sig: Take 1 tablet (5 mg total) by mouth every 6 (six)  hours as needed for severe pain. Must last 30 days.    Dispense:  120 tablet    Refill:  0    DO NOT: delete (not duplicate); no partial-fill (  will deny script to complete), no refill request (F/U required). DISPENSE: 1 day early if closed on fill date. WARN: No CNS-depressants within 8 hrs of med.   oxyCODONE (OXY IR/ROXICODONE) 5 MG immediate release tablet    Sig: Take 1 tablet (5 mg total) by mouth every 6 (six) hours as needed for severe pain. Must last 30 days.    Dispense:  120 tablet    Refill:  0    DO NOT: delete (not duplicate); no partial-fill (will deny script to complete), no refill request (F/U required). DISPENSE: 1 day early if closed on fill date. WARN: No CNS-depressants within 8 hrs of med.   naloxone (NARCAN) nasal spray 4 mg/0.1 mL    Sig: Place 1 spray into the nose as needed for up to 365 doses (for opioid-induced respiratory depresssion). In case of emergency (overdose), spray once into each nostril. If no response within 3 minutes, repeat application and call [phone].    Dispense:  1 each    Refill:  0    Instruct patient in proper use of device.   oxyCODONE (OXYCONTIN) 10 mg 12 hr tablet    Sig: Take 1 tablet (10 mg total) by mouth every 12 (twelve) hours. Must last 30 days.    Dispense:  60 tablet    Refill:  0    DO NOT: delete (not duplicate); no partial-fill (will deny script to complete), no refill request (F/U required). DISPENSE: 1 day early if closed on fill date. WARN: No CNS-depressants within 8 hrs of med.   oxyCODONE (OXYCONTIN) 10 mg 12 hr tablet    Sig: Take 1 tablet (10 mg total) by mouth every 12 (twelve) hours. Must last 30 days.    Dispense:  60 tablet    Refill:  0    DO NOT: delete (not duplicate); no partial-fill (will deny script to complete), no refill request (F/U required). DISPENSE: 1 day early if closed on fill date. WARN: No CNS-depressants within 8 hrs of med.   oxyCODONE (OXYCONTIN) 10 mg 12 hr tablet    Sig: Take 1 tablet (10 mg total) by  mouth every 12 (twelve) hours. Must last 30 days.    Dispense:  60 tablet    Refill:  0    DO NOT: delete (not duplicate); no partial-fill (will deny script to complete), no refill request (F/U required). DISPENSE: 1 day early if closed on fill date. WARN: No CNS-depressants within 8 hrs of med.   Orders:  Orders Placed This Encounter  Procedures   Radiofrequency,Lumbar    Standing Status:   Future    Standing Expiration Date:   05/24/2022    Scheduling Instructions:     Side(s): Left-sided     Level: L3-4, L4-5, & L5-S1 Facets (L2, L3, L4, L5, & S1 Medial Branch Nerves)     Sedation: With Sedation.     Scheduling Timeframe: As soon as pre-approved    Order Specific Question:   Where will this procedure be performed?    Answer:   [facility]   Ambulatory referral to Neurosurgery    Referral Priority:   Routine    Referral Type:   Surgical    Referral Reason:   Specialty Services Required    Referred to Provider:   Meade Maw, MD    Requested Specialty:   Neurosurgery    Number of Visits Requested:   1   Follow-up plan:   Return for (59mn), (ECT): (L) L-FCT RFA #2.  Interventional Therapies  Risk  Complexity Considerations:     NOTE: Poor candidate for RFA 2ry to Hardware   Planned  Pending:   Therapeutic left lumbar facet RFA #2    Under consideration:   Diagnostic/therapeutic left L2-3 LESI #1  Diagnostic bilateral thoracolumbar facet MBB (L1-2 through L3-4) #1  Diagnostic/therapeutic bilateral L1-2 and L2-3 transforaminal ESI #1  Possible Racz procedure #1    Completed:   Diagnostic/therapeutic bilateral lumbar facet MBB x3 (04/27/2021) (100/100/50/70)  Palliative right lumbar facet RFA x1 (01/23/18) (100/100/100/75)  Palliative left lumbar facet RFA x1 (11/21/17) (100/100/50/75)  Therapeutic left caudal ESI x1 (05/14/2019) (100/100/50/100) (100% relief of LEP as of 05/26/2021) (743 days) Therapeutic bilateral erector spinae & quadratus lumborum  muscle MNB/TPI x2 (04/10/2018) (100/100/0/0)   Therapeutic  Palliative (PRN) options:   Therapeutic/palliative bilateral lumbar facet MBB    Pharmacological Recommendations:   Nonopioids transferred 02/05/2020: Baclofen     Recent Visits No visits were found meeting these conditions. Showing recent visits within past 90 days and meeting all other requirements Today's Visits Date Type Provider Dept  02/21/22 Office Visit Milinda Pointer, MD [facility]  Showing today's visits and meeting all other requirements Future Appointments No visits were found meeting these conditions. Showing future appointments within next 90 days and meeting all other requirements  I discussed the assessment and treatment plan with the patient. The patient was provided an opportunity to ask questions and all were answered. The patient agreed with the plan and demonstrated an understanding of the instructions.  Patient advised to call back or seek an in-person evaluation if the symptoms or condition worsens.  Duration of encounter: 30 minutes.  Total time on encounter, as per AMA guidelines included both the face-to-face and non-face-to-face time personally spent by the physician and/or other qualified health care professional(s) on the day of the encounter (includes time in activities that require the physician or other qualified health care professional and does not include time in activities normally performed by clinical staff). Physician's time may include the following activities when performed: preparing to see the patient (eg, review of tests, pre-charting review of records) obtaining and/or reviewing separately obtained history performing a medically appropriate examination and/or evaluation counseling and educating the patient/family/caregiver ordering medications, tests, or procedures referring and communicating with other health care professionals (when not separately  reported) documenting clinical information in the electronic or other health record independently interpreting results (not separately reported) and communicating results to the patient/ family/caregiver care coordination (not separately reported)  Note by: Gaspar Cola, MD Date: 02/21/2022; Time: 8:42 AM

## 2022-02-20 NOTE — Patient Instructions (Addendum)
______________________________________________________________________  Preparing for your procedure  During your procedure appointment there will be: No Prescription Refills. No disability issues to discussed. No medication changes or discussions.  Instructions: Food intake: Avoid eating anything solid for at least 8 hours prior to your procedure. Clear liquid intake: You may take clear liquids such as water up to 2 hours prior to your procedure. (No carbonated drinks. No soda.) Transportation: Unless otherwise stated by your physician, bring a driver. Morning Medicines: Except for blood thinners, take all of your other morning medications with a sip of water. Make sure to take your heart and blood pressure medicines. If your blood pressure's lower number is above 100, the case will be rescheduled. Blood thinners: If you take a blood thinner, but were not instructed to stop it, call our office [phone] and ask to talk to a nurse. Not stopping a blood thinner prior to certain procedures could lead to serious complications. Diabetics on insulin: Notify the staff so that you can be scheduled 1st case in the morning. If your diabetes requires high dose insulin, take only  of your normal insulin dose the morning of the procedure and notify the staff that you have done so. Preventing infections: Shower with an antibacterial soap the morning of your procedure.  Build-up your immune system: Take 1000 mg of Vitamin C with every meal (3 times a day) the day prior to your procedure. Antibiotics: Inform the nursing staff if you are taking any antibiotics or if you have any conditions that may require antibiotics prior to procedures. (Example: recent joint implants)   Pregnancy: If you are pregnant make sure to notify the nursing staff. Not doing so may result in injury to the fetus, including death.  Sickness: If you have a cold, fever, or any active infections, call and cancel or reschedule your  procedure. Receiving steroids while having an infection may result in complications. Arrival: You must be in the facility at least 30 minutes prior to your scheduled procedure. Tardiness: Your scheduled time is also the cutoff time. If you do not arrive at least 15 minutes prior to your procedure, you will be rescheduled.  Children: Do not bring any children with you. Make arrangements to keep them home. Dress appropriately: There is always a possibility that your clothing may get soiled. Avoid long dresses. Valuables: Do not bring any jewelry or valuables.  Reasons to call and reschedule or cancel your procedure: (Following these recommendations will minimize the risk of a serious complication.) Surgeries: Avoid having procedures within 2 weeks of any surgery. (Avoid for 2 weeks before or after any surgery). Flu Shots: Avoid having procedures within 2 weeks of a flu shots or . (Avoid for 2 weeks before or after immunizations). Barium: Avoid having a procedure within 7-10 days after having had a radiological study involving the use of radiological contrast. (Myelograms, Barium swallow or enema study). Heart attacks: Avoid any elective procedures or surgeries for the initial 6 months after a "Myocardial Infarction" (Heart Attack). Blood thinners: It is imperative that you stop these medications before procedures. Let us know if you if you take any blood thinner.  Infection: Avoid procedures during or within two weeks of an infection (including chest colds or gastrointestinal problems). Symptoms associated with infections include: Localized redness, fever, chills, night sweats or profuse sweating, burning sensation when voiding, cough, congestion, stuffiness, runny nose, sore throat, diarrhea, nausea, vomiting, cold or Flu symptoms, recent or current infections. It is specially important if the infection is   over the area that we intend to treat. Heart and lung problems: Symptoms that may suggest an  active cardiopulmonary problem include: cough, chest pain, breathing difficulties or shortness of breath, dizziness, ankle swelling, uncontrolled high or unusually low blood pressure, and/or palpitations. If you are experiencing any of these symptoms, cancel your procedure and contact your primary care physician for an evaluation.  Remember:  Regular Business hours are:  Monday to Thursday 8:00 AM to 4:00 PM  Provider's Schedule: Mehar Sagen, MD:  Procedure days: Tuesday and Thursday 7:30 AM to 4:00 PM  Bilal Lateef, MD:  Procedure days: Monday and Wednesday 7:30 AM to 4:00 PM  ______________________________________________________________________    ____________________________________________________________________________________________  General Risks and Possible Complications  Patient Responsibilities: It is important that you read this as it is part of your informed consent. It is our duty to inform you of the risks and possible complications associated with treatments offered to you. It is your responsibility as a patient to read this and to ask questions about anything that is not clear or that you believe was not covered in this document.  Patient's Rights: You have the right to refuse treatment. You also have the right to change your mind, even after initially having agreed to have the treatment done. However, under this last option, if you wait until the last second to change your mind, you may be charged for the materials used up to that point.  Introduction: Medicine is not an exact science. Everything in Medicine, including the lack of treatment(s), carries the potential for danger, harm, or loss (which is by definition: Risk). In Medicine, a complication is a secondary problem, condition, or disease that can aggravate an already existing one. All treatments carry the risk of possible complications. The fact that a side effects or complications occurs, does not imply  that the treatment was conducted incorrectly. It must be clearly understood that these can happen even when everything is done following the highest safety standards.  No treatment: You can choose not to proceed with the proposed treatment alternative. The "PRO(s)" would include: avoiding the risk of complications associated with the therapy. The "CON(s)" would include: not getting any of the treatment benefits. These benefits fall under one of three categories: diagnostic; therapeutic; and/or palliative. Diagnostic benefits include: getting information which can ultimately lead to improvement of the disease or symptom(s). Therapeutic benefits are those associated with the successful treatment of the disease. Finally, palliative benefits are those related to the decrease of the primary symptoms, without necessarily curing the condition (example: decreasing the pain from a flare-up of a chronic condition, such as incurable terminal cancer).  General Risks and Complications: These are associated to most interventional treatments. They can occur alone, or in combination. They fall under one of the following six (6) categories: no benefit or worsening of symptoms; bleeding; infection; nerve damage; allergic reactions; and/or death. No benefits or worsening of symptoms: In Medicine there are no guarantees, only probabilities. No healthcare provider can ever guarantee that a medical treatment will work, they can only state the probability that it may. Furthermore, there is always the possibility that the condition may worsen, either directly, or indirectly, as a consequence of the treatment. Bleeding: This is more common if the patient is taking a blood thinner, either prescription or over the counter (example: Goody Powders, Fish oil, Aspirin, Garlic, etc.), or if suffering a condition associated with impaired coagulation (example: Hemophilia, cirrhosis of the liver, low platelet counts, etc.). However, even if   you  do not have one on these, it can still happen. If you have any of these conditions, or take one of these drugs, make sure to notify your treating physician. Infection: This is more common in patients with a compromised immune system, either due to disease (example: diabetes, cancer, human immunodeficiency virus [HIV], etc.), or due to medications or treatments (example: therapies used to treat cancer and rheumatological diseases). However, even if you do not have one on these, it can still happen. If you have any of these conditions, or take one of these drugs, make sure to notify your treating physician. Nerve Damage: This is more common when the treatment is an invasive one, but it can also happen with the use of medications, such as those used in the treatment of cancer. The damage can occur to small secondary nerves, or to large primary ones, such as those in the spinal cord and brain. This damage may be temporary or permanent and it may lead to impairments that can range from temporary numbness to permanent paralysis and/or brain death. Allergic Reactions: Any time a substance or material comes in contact with our body, there is the possibility of an allergic reaction. These can range from a mild skin rash (contact dermatitis) to a severe systemic reaction (anaphylactic reaction), which can result in death. Death: In general, any medical intervention can result in death, most of the time due to an unforeseen complication. ____________________________________________________________________________________________    ____________________________________________________________________________________________  Patient Information update  To: All of our patients.  Re: Name change.  It has been made official that our current name, "[facility]"   will soon be changed to "[facility]".    The purpose of this change is to eliminate any confusion created by the concept of our practice being a "Medication Management Pain Clinic". In the past this has led to the misconception that we treat pain primarily by the use of prescription medications.  Nothing can be farther from the truth.   Understanding PAIN MANAGEMENT: To further understand what our practice does, you first have to understand that "Pain Management" is a subspecialty that requires additional training once a physician has completed their specialty training, which can be in either Anesthesia, Neurology, Psychiatry, or Physical Medicine and Rehabilitation (PMR). Each one of these contributes to the final approach taken by each physician to the management of their patient's pain. To be a "Pain Management Specialist" you must have first completed one of the specialty trainings below.  Anesthesiologists - trained in clinical pharmacology and interventional techniques such as nerve blockade and regional as well as central neuroanatomy. They are trained to block pain before, during, and after surgical interventions.  Neurologists - trained in the diagnosis and pharmacological treatment of complex neurological conditions, such as Multiple Sclerosis, Parkinson's, spinal cord injuries, and other systemic conditions that may be associated with symptoms that may include but are not limited to pain. They tend to rely primarily on the treatment of chronic pain using prescription medications.  Psychiatrist - trained in conditions affecting the psychosocial wellbeing of patients including but not limited to depression, anxiety, schizophrenia, personality disorders, addiction, and other substance use disorders that may be associated with chronic pain. They tend to rely primarily on the treatment of chronic pain using prescription medications.   Physical Medicine and Rehabilitation (PMR) physicians, also known as physiatrists - trained to treat a  wide variety of medical conditions affecting   the brain, spinal cord, nerves, bones, joints, ligaments, muscles, and tendons. Their training is primarily aimed at treating patients that have suffered injuries that have caused severe physical impairment. Their training is primarily aimed at the physical therapy and rehabilitation of those patients. They may also work alongside orthopedic surgeons or neurosurgeons using their expertise in assisting surgical patients to recover after their surgeries.  INTERVENTIONAL PAIN MANAGEMENT is sub-subspecialty of Pain Management.  Our physicians are Board-certified in Anesthesia, Pain Management, and Interventional Pain Management.  This meaning that not only have they been trained and Board-certified in their specialty of Anesthesia, and subspecialty of Pain Management, but they have also received further training in the sub-subspecialty of Interventional Pain Management, in order to become Board-certified as INTERVENTIONAL PAIN MANAGEMENT SPECIALIST.    Mission: Our goal is to use our skills in  INTERVENTIONAL PAIN MANAGEMENT as alternatives to the chronic use of prescription opioid medications for the treatment of pain. To make this more clear, we have changed our name to reflect what we do and offer. We will continue to offer medication management assessment and recommendations, but we will not be taking over any patient's medication management.  ____________________________________________________________________________________________     _______________________________________________________________________  Medication Rules  Purpose: To inform patients, and their family members, of our medication rules and regulations.  Applies to: All patients receiving prescriptions from our practice (written or electronic).  Pharmacy of record: This is the pharmacy where your electronic prescriptions will be sent. Make sure we have the correct one.  Electronic  prescriptions: In compliance with the [place] Strengthen Opioid Misuse Prevention (STOP) Act of 2017 (Session Law 2017-74/H243), effective March 28, 2018, all controlled substances must be electronically prescribed. Written prescriptions, faxing, or calling prescriptions to a pharmacy will no longer be done.  Prescription refills: These will be provided only during in-person appointments. No medications will be renewed without a "face-to-face" evaluation with your provider. Applies to all prescriptions.  NOTE: The following applies primarily to controlled substances (Opioid* Pain Medications).   Type of encounter (visit): For patients receiving controlled substances, face-to-face visits are required. (Not an option and not up to the patient.)  Patient's responsibilities: Pain Pills: Bring all pain pills to every appointment (except for procedure appointments). Pill Bottles: Bring pills in original pharmacy bottle. Bring bottle, even if empty. Always bring the bottle of the most recent fill.  Medication refills: You are responsible for knowing and keeping track of what medications you are taking and when is it that you will need a refill. The day before your appointment: write a list of all prescriptions that need to be refilled. The day of the appointment: give the list to the admitting nurse. Prescriptions will be written only during appointments. No prescriptions will be written on procedure days. If you forget a medication: it will not be "Called in", "Faxed", or "electronically sent". You will need to get another appointment to get these prescribed. No early refills. Do not call asking to have your prescription filled early. Partial  or short prescriptions: Occasionally your pharmacy may not have enough pills to fill your prescription.  NEVER ACCEPT a partial fill or a prescription that is short of the total amount of pills that you were prescribed.  With controlled substances the law  allows 72 hours for the pharmacy to complete the prescription.  If the prescription is not completed within 72 hours, the pharmacist will require a new prescription to be written. This means that you will be short   on your medicine and we WILL NOT send another prescription to complete your original prescription.  Instead, request the pharmacy to send a carrier to a nearby branch to get enough medication to provide you with your full prescription. Prescription Accuracy: You are responsible for carefully inspecting your prescriptions before leaving our office. Have the discharge nurse carefully go over each prescription with you, before taking them home. Make sure that your name is accurately spelled, that your address is correct. Check the name and dose of your medication to make sure it is accurate. Check the number of pills, and the written instructions to make sure they are clear and accurate. Make sure that you are given enough medication to last until your next medication refill appointment. Taking Medication: Take medication as prescribed. When it comes to controlled substances, taking less pills or less frequently than prescribed is permitted and encouraged. Never take more pills than instructed. Never take the medication more frequently than prescribed.  Inform other Doctors: Always inform, all of your healthcare providers, of all the medications you take. Pain Medication from other Providers: You are not allowed to accept any additional pain medication from any other Doctor or Healthcare provider. There are two exceptions to this rule. (see below) In the event that you require additional pain medication, you are responsible for notifying us, as stated below. Cough Medicine: Often these contain an opioid, such as codeine or hydrocodone. Never accept or take cough medicine containing these opioids if you are already taking an opioid* medication. The combination may cause respiratory failure and  death. Medication Agreement: You are responsible for carefully reading and following our Medication Agreement. This must be signed before receiving any prescriptions from our practice. Safely store a copy of your signed Agreement. Violations to the Agreement will result in no further prescriptions. (Additional copies of our Medication Agreement are available upon request.) Laws, Rules, & Regulations: All patients are expected to follow all Federal and State Laws, Statutes, Rules, & Regulations. Ignorance of the Laws does not constitute a valid excuse.  Illegal drugs and Controlled Substances: The use of illegal substances (including, but not limited to marijuana and its derivatives) and/or the illegal use of any controlled substances is strictly prohibited. Violation of this rule may result in the immediate and permanent discontinuation of any and all prescriptions being written by our practice. The use of any illegal substances is prohibited. Adopted CDC guidelines & recommendations: Target dosing levels will be at or below 60 MME/day. Use of benzodiazepines** is not recommended.  Exceptions: There are only two exceptions to the rule of not receiving pain medications from other Healthcare Providers. Exception #1 (Emergencies): In the event of an emergency (i.e.: accident requiring emergency care), you are allowed to receive additional pain medication. However, you are responsible for: As soon as you are able, call our office [phone], at any time of the day or night, and leave a message stating your name, the date and nature of the emergency, and the name and dose of the medication prescribed. In the event that your call is answered by a member of our staff, make sure to document and save the date, time, and the name of the person that took your information.  Exception #2 (Planned Surgery): In the event that you are scheduled by another doctor or dentist to have any type of surgery or procedure, you  are allowed (for a period no longer than 30 days), to receive additional pain medication, for the acute post-op   pain. However, in this case, you are responsible for picking up a copy of our "Post-op Pain Management for Surgeons" handout, and giving it to your surgeon or dentist. This document is available at our office, and does not require an appointment to obtain it. Simply go to our office during business hours (Monday-Thursday from 8:00 AM to 4:00 PM) (Friday 8:00 AM to 12:00 Noon) or if you have a scheduled appointment with us, prior to your surgery, and ask for it by name. In addition, you are responsible for: calling our office [phone], at any time of the day or night, and leaving a message stating your name, name of your surgeon, type of surgery, and date of procedure or surgery. Failure to comply with your responsibilities may result in termination of therapy involving the controlled substances. Medication Agreement Violation. Following the above rules, including your responsibilities will help you in avoiding a Medication Agreement Violation ("Breaking your Pain Medication Contract").  Consequences:  Not following the above rules may result in permanent discontinuation of medication prescription therapy.  *Opioid medications include: morphine, codeine, oxycodone, oxymorphone, hydrocodone, hydromorphone, meperidine, tramadol, tapentadol, buprenorphine, fentanyl, methadone. **Benzodiazepine medications include: diazepam (Valium), alprazolam (Xanax), clonazepam (Klonopine), lorazepam (Ativan), clorazepate (Tranxene), chlordiazepoxide (Librium), estazolam (Prosom), oxazepam (Serax), temazepam (Restoril), triazolam (Halcion) (Last updated: 01/18/2022) ______________________________________________________________________    ______________________________________________________________________  Medication Recommendations and Reminders  Applies to: All patients receiving prescriptions  (written and/or electronic).  Medication Rules & Regulations: You are responsible for reading, knowing, and following our "Medication Rules" document. These exist for your safety and that of others. They are not flexible and neither are we. Dismissing or ignoring them is an act of "non-compliance" that may result in complete and irreversible termination of such medication therapy. For safety reasons, "non-compliance" will not be tolerated. As with the [place] fundamental legal principle of "ignorance of the law is no defense", we will accept no excuses for not having read and knowing the content of documents provided to you by our practice.  Pharmacy of record:  Definition: This is the pharmacy where your electronic prescriptions will be sent.  We do not endorse any particular pharmacy. It is up to you and your insurance to decide what pharmacy to use.  We do not restrict you in your choice of pharmacy. However, once we write for your prescriptions, we will NOT be re-sending more prescriptions to fix restricted supply problems created by your pharmacy, or your insurance.  The pharmacy listed in the electronic medical record should be the one where you want electronic prescriptions to be sent. If you choose to change pharmacy, simply notify our nursing staff. Changes will be made only during your regular appointments and not over the phone.  Recommendations: Keep all of your pain medications in a safe place, under lock and key, even if you live alone. We will NOT replace lost, stolen, or damaged medication. We do not accept "Police Reports" as proof of medications having been stolen. After you fill your prescription, take 1 week's worth of pills and put them away in a safe place. You should keep a separate, properly labeled bottle for this purpose. The remainder should be kept in the original bottle. Use this as your primary supply, until it runs out. Once it's gone, then you know that you have 1 week's  worth of medicine, and it is time to come in for a prescription refill. If you do this correctly, it is unlikely that you will ever run out of medicine. To   make sure that the above recommendation works, it is very important that you make sure your medication refill appointments are scheduled at least 1 week before you run out of medicine. To do this in an effective manner, make sure that you do not leave the office without scheduling your next medication management appointment. Always ask the nursing staff to show you in your prescription , when your medication will be running out. Then arrange for the receptionist to get you a return appointment, at least 7 days before you run out of medicine. Do not wait until you have 1 or 2 pills left, to come in. This is very poor planning and does not take into consideration that we may need to cancel appointments due to bad weather, sickness, or emergencies affecting our staff. DO NOT ACCEPT A "Partial Fill": If for any reason your pharmacy does not have enough pills/tablets to completely fill or refill your prescription, do not allow for a "partial fill". The law allows the pharmacy to complete that prescription within 72 hours, without requiring a new prescription. If they do not fill the rest of your prescription within those 72 hours, you will need a separate prescription to fill the remaining amount, which we will NOT provide. If the reason for the partial fill is your insurance, you will need to talk to the pharmacist about payment alternatives for the remaining tablets, but again, DO NOT ACCEPT A PARTIAL FILL, unless you can trust your pharmacist to obtain the remainder of the pills within 72 hours.  Prescription refills and/or changes in medication(s):  Prescription refills, and/or changes in dose or medication, will be conducted only during scheduled medication management appointments. (Applies to both, written and electronic prescriptions.) No refills on  procedure days. No medication will be changed or started on procedure days. No changes, adjustments, and/or refills will be conducted on a procedure day. Doing so will interfere with the diagnostic portion of the procedure. No phone refills. No medications will be "called into the pharmacy". No Fax refills. No weekend refills. No Holliday refills. No after hours refills.  Remember:  Business hours are:  Monday to Thursday 8:00 AM to 4:00 PM Provider's Schedule: Coron Rossano, MD - Appointments are:  Medication management: Monday and Wednesday 8:00 AM to 4:00 PM Procedure day: Tuesday and Thursday 7:30 AM to 4:00 PM Bilal Lateef, MD - Appointments are:  Medication management: Tuesday and Thursday 8:00 AM to 4:00 PM Procedure day: Monday and Wednesday 7:30 AM to 4:00 PM (Last update: 01/18/2022) ______________________________________________________________________    ____________________________________________________________________________________________  Pharmacy Shortages of Pain Medication   Introduction Shockingly as it may seem, .  "No U.[place] Supreme Court decision has ever interpreted the Constitution as guaranteeing a right to health care for all Americans." - https://www.healthequityandpolicylab.com/elusive-right-to-health-care-under-us-law  "With respect to human rights, the [place] has no formally codified right to health, nor does it participate in a human rights treaty that specifies a right to health." - Scott J. Schweikart, JD, MBE  Situation By now, most of our patients have had the experience of being told by their pharmacist that they do not have enough medication to cover their prescription. If you have not had this experience, just know that you soon will.  Problem There appears to be a shortage of these medications, either at the national level or locally. This is happening with all pharmacies. When there is not enough medication, patients are  offered a partial fill and they are told that they will try to   get the rest of the medicine for them at a later time. If they do not have enough for even a partial fill, the pharmacists are telling the patients to call us (the prescribing physicians) to request that we send another prescription to another pharmacy to get the medicine.   This reordering of a controlled substance creates documentation problems where additional paperwork needs to be created to explain why two prescriptions for the same period of time and the same medicine are being prescribed to the same patient. It also creates situations where the last appointment note does not accurately reflect when and what prescriptions were given to a patient. This leads to prescribing errors down the line, in subsequent follow-up visits.   [place] Board [employer] ([place]) Research revealed that Board of Pharmacy Rule .1806 (21 NCAC 46.1806) authorizes pharmacists to the transfer of prescriptions among pharmacies, and it sets forth procedural and recordkeeping requirements for doing so. However, this requires the pharmacist to complete the previously mentioned procedural paperwork to accomplish the transfer. As it turns out, it is much easier for them to have the prescribing physicians do the work.   Possible solutions 1. You can ask your physician to assist you in weaning yourself off these medications. 2. Ask your pharmacy if the medication is in stock, 3 days prior to your refill. 3. If you need a pharmacy change, let us know at your medication management visit. Prescriptions that have already been electronically sent to a pharmacy will not be re-sent to a different pharmacy if your pharmacy of record does not have it in stock. Proper stocking of medication is a pharmacy problem, not a prescriber problem. Work with your pharmacist to solve the problem. 4. Have the [place] Assembly add a provision to the "STOP ACT" (the law that  mandates how controlled substances are prescribed) where there is an exception to the electronic prescribing rule that states that in the event there are shortages of medications the physicians are allowed to use written prescriptions as opposed to electronic ones. This would allow patients to take their prescriptions to a different pharmacy that may have enough medication available to fill the prescription. The problem is that currently there is a law that does not allow for written prescriptions, with the exception of instances where the electronic medical record is down due to technical issues.  5. Have [place] Congress ease the pressure on pharmaceutical companies, allowing them to produce enough quantities of the medication to adequately supply the population. 6. Have pharmacies keep enough stocks of these medications to cover their client base.  7. Have the [place] Assembly add a provision to the "STOP ACT" where they ease the regulations surrounding the transfer of controlled substances between pharmacies, so as to simplify the transfer of supplies. As an alternative, develop a system to allow patients to obtain the remainder of their prescription at another one of their pharmacies or at an associate pharmacy.   How this shortage will affect you.  Understand that this is a pharmacy supply problem, not a prescriber problem. Work with your pharmacy to solve it. The job of the prescriber is to evaluate and monitor the patient for the appropriate indications and use of these medicines. It is not the job of the prescriber to supply the medication or to solve problems with that supply. The responsibility and the choice to obtain the medication resides on the patient. By law, supplying the medication is the job of the   pharmacy. It is certainly not the job of the prescriber to solve supply problems.   Due to the above problems we are no longer taking patients to write for their pain medication. Future  discussions with your physician may include potentially weaning medications or transitioning to alternatives.  We will be focusing primarily on interventional based pain management. We will continue to evaluate for appropriate indications and we may provide recommendations regarding medication, dose, and schedule, as well as monitoring recommendations, however, we will not be taking over the actual prescribing of these substances. On those patients where we are treating their chronic pain with interventional therapies, exceptions will be considered on a case by case basis. At this time, we will try to continue providing this supplemental service to those patients we have been managing in the past. However, as of August 1st, 2023, we no longer will be sending additional prescriptions to other pharmacies for the purpose of solving their supply problems. Once we send a prescription to a pharmacy, we will not be resending it again to another pharmacy to cover for their shortages.   What to do. Write as many letters as you can. Recruit the help of family members in writing these letters. Below are some of the places where you can write to make your voice heard. Let them know what the problem is and push them to look for solutions.   Search internet for: "[place] find your legislators" https://www.ncleg.gov/findyourlegislators  Search internet for: "[place] insurance commissioner complaints" https://www.ncdoi.gov/contactscomplaints/assistance-or-file-complaint  Search internet for: "[place] Board of Pharmacy complaints" http://www.ncbop.org/contact.htm  Search internet for: "[facility] pharmacy complaints" Email [facility] Customer Relations https://www.[facility].com/help/email-customer-relations.jsp?callType=store  Search internet for: "[facility] pharmacy customer service  complaints" https://www.[facility].com/topic/marketing/contactus/contactus_customerservice.jsp  ____________________________________________________________________________________________     ____________________________________________________________________________________________  Drug Holidays (Slow)  What is a "Drug Holiday"? Drug Holiday: is the name given to the period of time during which a patient stops taking a medication(s) for the purpose of eliminating tolerance to the drug.  Benefits Improved effectiveness of opioids. Decreased opioid dose needed to achieve benefits. Improved pain with lesser dose.  What is tolerance? Tolerance: is the progressive decreased in effectiveness of a drug due to its repetitive use. With repetitive use, the body gets use to the medication and as a consequence, it loses its effectiveness. This is a common problem seen with opioid pain medications. As a result, a larger dose of the drug is needed to achieve the same effect that used to be obtained with a smaller dose.  How long should a "Drug Holiday" last? You should stay off of the pain medicine for at least 14 consecutive days. (2 weeks)  Should I stop the medicine "cold turkey"? No. You should always coordinate with your Pain Specialist so that he/she can provide you with the correct medication dose to make the transition as smoothly as possible.  How do I stop the medicine? Slowly. You will be instructed to decrease the daily amount of pills that you take by one (1) pill every seven (7) days. This is called a "slow downward taper" of your dose. For example: if you normally take four (4) pills per day, you will be asked to drop this dose to three (3) pills per day for seven (7) days, then to two (2) pills per day for seven (7) days, then to one (1) per day for seven (7) days, and at the end of those last seven (7) days, this is when the "Drug Holiday" would start.     Will I have  withdrawals? By doing a "slow downward taper" like this one, it is unlikely that you will experience any significant withdrawal symptoms. Typically, what triggers withdrawals is the sudden stop of a high dose opioid therapy. Withdrawals can usually be avoided by slowly decreasing the dose over a prolonged period of time. If you do not follow these instructions and decide to stop your medication abruptly, withdrawals may be possible.  What are withdrawals? Withdrawals: refers to the wide range of symptoms that occur after stopping or dramatically reducing opiate drugs after heavy and prolonged use. Withdrawal symptoms do not occur to patients that use low dose opioids, or those who take the medication sporadically. Contrary to benzodiazepine (example: Valium, Xanax, etc.) or alcohol withdrawals ("Delirium Tremens"), opioid withdrawals are not lethal. Withdrawals are the physical manifestation of the body getting rid of the excess receptors.  Expected Symptoms Early symptoms of withdrawal may include: Agitation Anxiety Muscle aches Increased tearing Insomnia Runny nose Sweating Yawning  Late symptoms of withdrawal may include: Abdominal cramping Diarrhea Dilated pupils Goose bumps Nausea Vomiting  Will I experience withdrawals? Due to the slow nature of the taper, it is very unlikely that you will experience any.  What is a slow taper? Taper: refers to the gradual decrease in dose.  (Last update: 10/16/2019) ____________________________________________________________________________________________    ____________________________________________________________________________________________  CBD (cannabidiol) & Delta-8 (Delta-8 tetrahydrocannabinol) WARNING  Intro: Cannabidiol (CBD) and tetrahydrocannabinol (THC), are two natural compounds found in plants of the Cannabis genus. They can both be extracted from hemp or cannabis. Hemp and cannabis come from the Cannabis sativa  plant. Both compounds interact with your body's endocannabinoid system, but they have very different effects. CBD does not produce the high sensation associated with cannabis. Delta-8 tetrahydrocannabinol, also known as delta-8 THC, is a psychoactive substance found in the Cannabis sativa plant, of which marijuana and hemp are two varieties. THC is responsible for the high associated with the illicit use of marijuana.  Applicable to: All individuals currently taking or considering taking CBD (cannabidiol) and, more important, all patients taking opioid analgesic controlled substances (pain medication). (Example: oxycodone; oxymorphone; hydrocodone; hydromorphone; morphine; methadone; tramadol; tapentadol; fentanyl; buprenorphine; butorphanol; dextromethorphan; meperidine; codeine; etc.)  Legal status: CBD remains a Schedule I drug prohibited for any use. CBD is illegal with one exception. In the [place], CBD has a limited Food and Drug Administration (FDA) approval for the treatment of two specific types of epilepsy disorders. Only one CBD product has been approved by the FDA for this purpose: "Epidiolex". FDA is aware that some companies are marketing products containing cannabis and cannabis-derived compounds in ways that violate the Federal Food, Drug and Cosmetic Act (FD&C Act) and that may put the health and safety of consumers at risk. The FDA, a Federal agency, has not enforced the CBD status since 2018. UPDATE: (05/14/2021) The Drug Enforcement Agency (DEA) issued a letter stating that "delta" cannabinoids, including Delta-8-THCO and Delta-9-THCO, synthetically derived from hemp do not qualify as hemp and will be viewed as Schedule I drugs. (Schedule I drugs, substances, or chemicals are defined as drugs with no currently accepted medical use and a high potential for abuse. Some examples of Schedule I drugs are: heroin, lysergic acid diethylamide (LSD), marijuana (cannabis),  3,4-methylenedioxymethamphetamine (ecstasy), methaqualone, and peyote.) (https://www.dea.gov)  Legality: Some manufacturers ship CBD products nationally, which is illegal. Often such products are sold online and are therefore available throughout the country. CBD is openly sold in head shops and health food stores in   some states where such sales have not been explicitly legalized. Selling unapproved products with unsubstantiated therapeutic claims is not only a violation of the law, but also can put patients at risk, as these products have not been proven to be safe or effective. Federal illegality makes it difficult to conduct research on CBD.  Reference: "FDA Regulation of Cannabis and Cannabis-Derived Products, Including Cannabidiol (CBD)" - https://www.fda.gov/news-events/public-health-focus/fda-regulation-cannabis-and-cannabis-derived-products-including-cannabidiol-cbd  Warning: CBD is not FDA approved and has not undergo the same manufacturing controls as prescription drugs.  This means that the purity and safety of available CBD may be questionable. Most of the time, despite manufacturer's claims, it is contaminated with THC (delta-9-tetrahydrocannabinol - the chemical in marijuana responsible for the "HIGH").  When this is the case, the THC contaminant will trigger a positive urine drug screen (UDS) test for Marijuana (carboxy-THC). Because a positive UDS for any illicit substance is a violation of our medication agreement, your opioid analgesics (pain medicine) may be permanently discontinued. The FDA recently put out a warning about 5 things that everyone should be aware of regarding Delta-8 THC: Delta-8 THC products have not been evaluated or approved by the FDA for safe use and may be marketed in ways that put the public health at risk. The FDA has received adverse event reports involving delta-8 THC-containing products. Delta-8 THC has psychoactive and intoxicating effects. Delta-8 THC  manufacturing often involve use of potentially harmful chemicals to create the concentrations of delta-8 THC claimed in the marketplace. The final delta-8 THC product may have potentially harmful by-products (contaminants) due to the chemicals used in the process. Manufacturing of delta-8 THC products may occur in uncontrolled or unsanitary settings, which may lead to the presence of unsafe contaminants or other potentially harmful substances. Delta-8 THC products should be kept out of the reach of children and pets.  MORE ABOUT CBD  General Information: CBD was discovered in 1940 and it is a derivative of the cannabis sativa genus plants (Marijuana and Hemp). It is one of the 113 identified substances found in Marijuana. It accounts for up to 40% of the plant's extract. As of 2018, preliminary clinical studies on CBD included research for the treatment of anxiety, movement disorders, and pain. CBD is available and consumed in multiple forms, including inhalation of smoke or vapor, as an aerosol spray, and by mouth. It may be supplied as an oil containing CBD, capsules, dried cannabis, or as a liquid solution. CBD is thought not to be as psychoactive as THC (delta-9-tetrahydrocannabinol - the chemical in marijuana responsible for the "HIGH"). Studies suggest that CBD may interact with different biological target receptors in the body, including cannabinoid and other neurotransmitter receptors. As of 2018 the mechanism of action for its biological effects has not been determined.  Side-effects  Adverse reactions: Dry mouth, diarrhea, decreased appetite, fatigue, drowsiness, malaise, weakness, sleep disturbances, and others.  Drug interactions: CBC may interact with other medications such as blood-thinners. Because CBD causes drowsiness on its own, it also increases the drowsiness caused by other medications, including antihistamines (such as Benadryl), benzodiazepines (Xanax, Ativan, Valium),  antipsychotics, antidepressants and opioids, as well as alcohol and supplements such as kava, melatonin and St. John's Wort. Be cautious with the following combinations:   Brivaracetam (Briviact) Brivaracetam is changed and broken down by the body. CBD might decrease how quickly the body breaks down brivaracetam. This might increase levels of brivaracetam in the body.  Caffeine Caffeine is changed and broken down by the body. CBD might decrease how quickly   the body breaks down caffeine. This might increase levels of caffeine in the body.  Carbamazepine (Tegretol) Carbamazepine is changed and broken down by the body. CBD might decrease how quickly the body breaks down carbamazepine. This might increase levels of carbamazepine in the body and increase its side effects.  Citalopram (Celexa) Citalopram is changed and broken down by the body. CBD might decrease how quickly the body breaks down citalopram. This might increase levels of citalopram in the body and increase its side effects.  Clobazam (Onfi) Clobazam is changed and broken down by the liver. CBD might decrease how quickly the liver breaks down clobazam. This might increase the effects and side effects of clobazam.  Eslicarbazepine (Aptiom) Eslicarbazepine is changed and broken down by the body. CBD might decrease how quickly the body breaks down eslicarbazepine. This might increase levels of eslicarbazepine in the body by a small amount.  Everolimus (Zostress) Everolimus is changed and broken down by the body. CBD might decrease how quickly the body breaks down everolimus. This might increase levels of everolimus in the body.  Lithium Taking higher doses of CBD might increase levels of lithium. This can increase the risk of lithium toxicity.  Medications changed by the liver (Cytochrome P450 1A1 (CYP1A1) substrates) Some medications are changed and broken down by the liver. CBD might change how quickly the liver breaks down these  medications. This could change the effects and side effects of these medications.  Medications changed by the liver (Cytochrome P450 1A2 (CYP1A2) substrates) Some medications are changed and broken down by the liver. CBD might change how quickly the liver breaks down these medications. This could change the effects and side effects of these medications.  Medications changed by the liver (Cytochrome P450 1B1 (CYP1B1) substrates) Some medications are changed and broken down by the liver. CBD might change how quickly the liver breaks down these medications. This could change the effects and side effects of these medications.  Medications changed by the liver (Cytochrome P450 2A6 (CYP2A6) substrates) Some medications are changed and broken down by the liver. CBD might change how quickly the liver breaks down these medications. This could change the effects and side effects of these medications.  Medications changed by the liver (Cytochrome P450 2B6 (CYP2B6) substrates) Some medications are changed and broken down by the liver. CBD might change how quickly the liver breaks down these medications. This could change the effects and side effects of these medications.  Medications changed by the liver (Cytochrome P450 2C19 (CYP2C19) substrates) Some medications are changed and broken down by the liver. CBD might change how quickly the liver breaks down these medications. This could change the effects and side effects of these medications.  Medications changed by the liver (Cytochrome P450 2C8 (CYP2C8) substrates) Some medications are changed and broken down by the liver. CBD might change how quickly the liver breaks down these medications. This could change the effects and side effects of these medications.  Medications changed by the liver (Cytochrome P450 2C9 (CYP2C9) substrates) Some medications are changed and broken down by the liver. CBD might change how quickly the liver breaks down these  medications. This could change the effects and side effects of these medications.  Medications changed by the liver (Cytochrome P450 2D6 (CYP2D6) substrates) Some medications are changed and broken down by the liver. CBD might change how quickly the liver breaks down these medications. This could change the effects and side effects of these medications.  Medications changed by the liver (Cytochrome   P450 2E1 (CYP2E1) substrates) Some medications are changed and broken down by the liver. CBD might change how quickly the liver breaks down these medications. This could change the effects and side effects of these medications.  Medications changed by the liver (Cytochrome P450 3A4 (CYP3A4) substrates) Some medications are changed and broken down by the liver. CBD might change how quickly the liver breaks down these medications. This could change the effects and side effects of these medications.  Medications changed by the liver (Glucuronidated drugs) Some medications are changed and broken down by the liver. CBD might change how quickly the liver breaks down these medications. This could change the effects and side effects of these medications.  Medications that decrease the breakdown of other medications by the liver (Cytochrome P450 2C19 (CYP2C19) inhibitors) CBD is changed and broken down by the liver. Some drugs decrease how quickly the liver changes and breaks down CBD. This could change the effects and side effects of CBD.  Medications that decrease the breakdown of other medications in the liver (Cytochrome P450 3A4 (CYP3A4) inhibitors) CBD is changed and broken down by the liver. Some drugs decrease how quickly the liver changes and breaks down CBD. This could change the effects and side effects of CBD.  Medications that increase breakdown of other medications by the liver (Cytochrome P450 3A4 (CYP3A4) inducers) CBD is changed and broken down by the liver. Some drugs increase how quickly the  liver changes and breaks down CBD. This could change the effects and side effects of CBD.  Medications that increase the breakdown of other medications by the liver (Cytochrome P450 2C19 (CYP2C19) inducers) CBD is changed and broken down by the liver. Some drugs increase how quickly the liver changes and breaks down CBD. This could change the effects and side effects of CBD.  Methadone (Dolophine) Methadone is broken down by the liver. CBD might decrease how quickly the liver breaks down methadone. Taking cannabidiol along with methadone might increase the effects and side effects of methadone.  Rufinamide (Banzel) Rufinamide is changed and broken down by the body. CBD might decrease how quickly the body breaks down rufinamide. This might increase levels of rufinamide in the body by a small amount.  Sedative medications (CNS depressants) CBD might cause sleepiness and slowed breathing. Some medications, called sedatives, can also cause sleepiness and slowed breathing. Taking CBD with sedative medications might cause breathing problems and/or too much sleepiness.  Sirolimus (Rapamune) Sirolimus is changed and broken down by the body. CBD might decrease how quickly the body breaks down sirolimus. This might increase levels of sirolimus in the body.  Stiripentol (Diacomit) Stiripentol is changed and broken down by the body. CBD might decrease how quickly the body breaks down stiripentol. This might increase levels of stiripentol in the body and increase its side effects.  Tacrolimus (Prograf) Tacrolimus is changed and broken down by the body. CBD might decrease how quickly the body breaks down tacrolimus. This might increase levels of tacrolimus in the body.  Tamoxifen (Soltamox) Tamoxifen is changed and broken down by the body. CBD might affect how quickly the body breaks down tamoxifen. This might affect levels of tamoxifen in the body.  Topiramate (Topamax) Topiramate is changed and broken  down by the body. CBD might decrease how quickly the body breaks down topiramate. This might increase levels of topiramate in the body by a small amount.  Valproate Valproic acid can cause liver injury. Taking cannabidiol with valproic acid might increase the chance of liver   injury. CBD and/or valproic acid might need to be stopped, or the dose might need to be reduced.  Warfarin (Coumadin) CBD might increase levels of warfarin, which can increase the risk for bleeding. CBD and/or warfarin might need to be stopped, or the dose might need to be reduced.  Zonisamide Zonisamide is changed and broken down by the body. CBD might decrease how quickly the body breaks down zonisamide. This might increase levels of zonisamide in the body by a small amount. (Last update: 05/26/2021) ____________________________________________________________________________________________   ____________________________________________________________________________________________  Naloxone Nasal Spray  Why am I receiving this medication? [place] STOP ACT requires that all patients taking high dose opioids or at risk of opioids respiratory depression, be prescribed an opioid reversal agent, such as Naloxone (AKA: Narcan).  What is this medication? NALOXONE (nal OX one) treats opioid overdose, which causes slow or shallow breathing, severe drowsiness, or trouble staying awake. Call emergency services after using this medication. You may need additional treatment. Naloxone works by reversing the effects of opioids. It belongs to a group of medications called opioid blockers.  COMMON BRAND NAME(S): Kloxxado, Narcan  What should I tell my care team before I take this medication? They need to know if you have any of these conditions: Heart disease Substance use disorder An unusual or allergic reaction to naloxone, other medications, foods, dyes, or preservatives Pregnant or trying to get  pregnant Breast-feeding  When to use this medication? This medication is to be used for the treatment of respiratory depression (less than 8 breaths per minute) secondary to opioid overdose.   How to use this medication? This medication is for use in the nose. Lay the person on their back. Support their neck with your hand and allow the head to tilt back before giving the medication. The nasal spray should be given into 1 nostril. After giving the medication, move the person onto their side. Do not remove or test the nasal spray until ready to use. Get emergency medical help right away after giving the first dose of this medication, even if the person wakes up. You should be familiar with how to recognize the signs and symptoms of a narcotic overdose. If more doses are needed, give the additional dose in the other nostril. Talk to your care team about the use of this medication in children. While this medication may be prescribed for children as young as newborns for selected conditions, precautions do apply.  Naloxone Overdosage: If you think you have taken too much of this medicine contact a poison control center or emergency room at once.  NOTE: This medicine is only for you. Do not share this medicine with others.  What if I miss a dose? This does not apply.  What may interact with this medication? This is only used during an emergency. No interactions are expected during emergency use. This list may not describe all possible interactions. Give your health care provider a list of all the medicines, herbs, non-prescription drugs, or dietary supplements you use. Also tell them if you smoke, drink alcohol, or use illegal drugs. Some items may interact with your medicine.  What should I watch for while using this medication? Keep this medication ready for use in the case of an opioid overdose. Make sure that you have the phone number of your care team and local hospital ready. You may need to  have additional doses of this medication. Each nasal spray contains a single dose. Some emergencies may require additional doses. After   use, bring the treated person to the nearest hospital or call 911. Make sure the treating care team knows that the person has received a dose of this medication. You will receive additional instructions on what to do during and after use of this medication before an emergency occurs.  What side effects may I notice from receiving this medication? Side effects that you should report to your care team as soon as possible: Allergic reactions--skin rash, itching, hives, swelling of the face, lips, tongue, or throat Side effects that usually do not require medical attention (report these to your care team if they continue or are bothersome): Constipation Dryness or irritation inside the nose Headache Increase in blood pressure Muscle spasms Stuffy nose Toothache This list may not describe all possible side effects. Call your doctor for medical advice about side effects. You may report side effects to FDA at 1-800-FDA-1088.  Where should I keep my medication? Because this is an emergency medication, you should keep it with you at all times.  Keep out of the reach of children and pets. Store between 20 and 25 degrees C (68 and 77 degrees F). Do not freeze. Throw away any unused medication after the expiration date. Keep in original box until ready to use.  NOTE: This sheet is a summary. It may not cover all possible information. If you have questions about this medicine, talk to your doctor, pharmacist, or health care provider.   2023 [employer]vier/Gold Standard (2020-11-20 00:00:00)  ____________________________________________________________________________________________   

## 2022-02-21 ENCOUNTER — Encounter: Payer: Self-pay | Admitting: Pain Medicine

## 2022-02-21 ENCOUNTER — Ambulatory Visit: Payer: No Typology Code available for payment source | Attending: Pain Medicine | Admitting: Pain Medicine

## 2022-02-21 VITALS — BP 142/79 | HR 65 | Temp 97.2°F | Ht 70.0 in | Wt 245.0 lb

## 2022-02-21 DIAGNOSIS — G894 Chronic pain syndrome: Secondary | ICD-10-CM

## 2022-02-21 DIAGNOSIS — Z79891 Long term (current) use of opiate analgesic: Secondary | ICD-10-CM | POA: Diagnosis present

## 2022-02-21 DIAGNOSIS — M961 Postlaminectomy syndrome, not elsewhere classified: Secondary | ICD-10-CM

## 2022-02-21 DIAGNOSIS — M79605 Pain in left leg: Secondary | ICD-10-CM

## 2022-02-21 DIAGNOSIS — M47817 Spondylosis without myelopathy or radiculopathy, lumbosacral region: Secondary | ICD-10-CM

## 2022-02-21 DIAGNOSIS — M545 Low back pain, unspecified: Secondary | ICD-10-CM | POA: Diagnosis not present

## 2022-02-21 DIAGNOSIS — Z79899 Other long term (current) drug therapy: Secondary | ICD-10-CM

## 2022-02-21 DIAGNOSIS — M47816 Spondylosis without myelopathy or radiculopathy, lumbar region: Secondary | ICD-10-CM

## 2022-02-21 DIAGNOSIS — M7918 Myalgia, other site: Secondary | ICD-10-CM

## 2022-02-21 DIAGNOSIS — M549 Dorsalgia, unspecified: Secondary | ICD-10-CM | POA: Diagnosis present

## 2022-02-21 DIAGNOSIS — G8929 Other chronic pain: Secondary | ICD-10-CM | POA: Diagnosis present

## 2022-02-21 DIAGNOSIS — T85192S Other mechanical complication of implanted electronic neurostimulator (electrode) of spinal cord, sequela: Secondary | ICD-10-CM

## 2022-02-21 MED ORDER — OXYCODONE HCL 5 MG PO TABS: 5.0000 mg | ORAL_TABLET | Freq: Four times a day (QID) | ORAL | 0 refills | Status: DC | PRN

## 2022-02-21 MED ORDER — OXYCODONE HCL ER 10 MG PO T12A: 10.0000 mg | EXTENDED_RELEASE_TABLET | Freq: Two times a day (BID) | ORAL | 0 refills | Status: DC

## 2022-02-21 MED ORDER — NALOXONE HCL 4 MG/0.1ML NA LIQD: 1.0000 | NASAL | 0 refills | Status: DC | PRN

## 2022-02-21 NOTE — Progress Notes (Signed)
Nursing Pain Medication Assessment:  Safety precautions to be maintained throughout the outpatient stay will include: orient to surroundings, keep bed in low position, maintain call bell within reach at all times, provide assistance with transfer out of bed and ambulation.  Nursing Pain Medication Assessment:  Safety precautions to be maintained throughout the outpatient stay will include: orient to surroundings, keep bed in low position, maintain call bell within reach at all times, provide assistance with transfer out of bed and ambulation.  Medication Inspection Compliance: Pill count conducted under aseptic conditions, in front of the patient. Neither the pills nor the bottle was removed from the patient's sight at any time. Once count was completed pills were immediately returned to the patient in their original bottle.  Medication #1: Oxycodone ER (OxyContin) Pill/Patch Count:  20 of 60 pills remain Pill/Patch Appearance: Markings consistent with prescribed medication Bottle Appearance: Standard pharmacy container. Clearly labeled. Filled Date: 55 / 7 / 2023 Last Medication intake:  Today  Medication #2: Oxycodone IR Pill/Patch Count:  42 of 120 pills remain Pill/Patch Appearance: Markings consistent with prescribed medication Bottle Appearance: Standard pharmacy container. Clearly labeled. Filled Date: 89 / 7 / 2023 Last Medication intake:  Today

## 2022-03-03 ENCOUNTER — Ambulatory Visit
Admission: RE | Admit: 2022-03-03 | Discharge: 2022-03-03 | Disposition: A | Discharge status: Home / Self Care | Payer: BC Managed Care – PPO | Source: Ambulatory Visit | Attending: Pain Medicine | Admitting: Pain Medicine

## 2022-03-03 ENCOUNTER — Encounter: Payer: Self-pay | Admitting: Pain Medicine

## 2022-03-03 ENCOUNTER — Ambulatory Visit: Payer: No Typology Code available for payment source | Attending: Pain Medicine | Admitting: Pain Medicine

## 2022-03-03 VITALS — BP 162/94 | HR 61 | Temp 97.2°F | Resp 16 | Ht 70.0 in | Wt 245.0 lb

## 2022-03-03 DIAGNOSIS — M47816 Spondylosis without myelopathy or radiculopathy, lumbar region: Secondary | ICD-10-CM | POA: Diagnosis present

## 2022-03-03 DIAGNOSIS — M545 Low back pain, unspecified: Secondary | ICD-10-CM | POA: Diagnosis present

## 2022-03-03 DIAGNOSIS — G8929 Other chronic pain: Secondary | ICD-10-CM | POA: Diagnosis present

## 2022-03-03 DIAGNOSIS — M961 Postlaminectomy syndrome, not elsewhere classified: Secondary | ICD-10-CM | POA: Diagnosis present

## 2022-03-03 DIAGNOSIS — M47817 Spondylosis without myelopathy or radiculopathy, lumbosacral region: Secondary | ICD-10-CM | POA: Insufficient documentation

## 2022-03-03 DIAGNOSIS — M5137 Other intervertebral disc degeneration, lumbosacral region: Secondary | ICD-10-CM | POA: Insufficient documentation

## 2022-03-03 DIAGNOSIS — G8918 Other acute postprocedural pain: Secondary | ICD-10-CM | POA: Insufficient documentation

## 2022-03-03 MED ORDER — LIDOCAINE HCL 2 % IJ SOLN
INTRAMUSCULAR | Status: AC
  Filled 2022-03-03: qty 20

## 2022-03-03 MED ORDER — ROPIVACAINE HCL 2 MG/ML IJ SOLN
9.0000 mL | Freq: Once | INTRAMUSCULAR | Status: AC
  Administered 2022-03-03: 9 mL via PERINEURAL

## 2022-03-03 MED ORDER — LIDOCAINE HCL 2 % IJ SOLN
20.0000 mL | Freq: Once | INTRAMUSCULAR | Status: AC
  Administered 2022-03-03: 400 mg

## 2022-03-03 MED ORDER — ROPIVACAINE HCL 2 MG/ML IJ SOLN
INTRAMUSCULAR | Status: AC
  Filled 2022-03-03: qty 20

## 2022-03-03 MED ORDER — PENTAFLUOROPROP-TETRAFLUOROETH EX AERO
INHALATION_SPRAY | Freq: Once | CUTANEOUS | Status: AC
  Administered 2022-03-03: 30 via TOPICAL

## 2022-03-03 MED ORDER — TRIAMCINOLONE ACETONIDE 40 MG/ML IJ SUSP
INTRAMUSCULAR | Status: AC
  Filled 2022-03-03: qty 1

## 2022-03-03 MED ORDER — HYDROCODONE-ACETAMINOPHEN 5-325 MG PO TABS: 1.0000 | ORAL_TABLET | Freq: Three times a day (TID) | ORAL | 0 refills | Status: AC | PRN

## 2022-03-03 MED ORDER — TRIAMCINOLONE ACETONIDE 40 MG/ML IJ SUSP
40.0000 mg | Freq: Once | INTRAMUSCULAR | Status: AC
  Administered 2022-03-03: 40 mg

## 2022-03-03 NOTE — Progress Notes (Signed)
PROVIDER NOTE: Interpretation of information contained herein should be left to medically-trained personnel. Specific patient instructions are provided elsewhere under "Patient Instructions" section of medical record. This document was created in part using STT-dictation technology, any transcriptional errors that may result from this process are unintentional.  Patient: Lance Roman Type: Established DOB: [birth year] MRN: [id number] PCP: Hoyt Koch, MD  Service: Procedure DOS: 03/03/2022 Setting: Ambulatory Location: Ambulatory outpatient facility Delivery: Face-to-face Provider: Gaspar Cola, MD Specialty: Interventional Pain Management Specialty designation: 09 Location: Outpatient facility Ref. Prov.: Milinda Pointer, MD    Procedure:           Type: Lumbar Facet, Medial Branch Radiofrequency Ablation (RFA) #2  Laterality: Left (-LT)  Level: L2, L3, L4, L5, & S1 Medial Branch Level(s). These levels will denervate the L3-4, L4-5 and L5-S1 lumbar facet joints.  Imaging: Fluoroscopy-guided         Anesthesia: Local anesthesia (1-2% Lidocaine) Anxiolysis: None Sedation: No Sedation                       DOS: 03/03/2022  Performed by: Gaspar Cola, MD  Purpose: Therapeutic/Palliative Indications: Low back pain severe enough to impact quality of life or function. Indications: 1. Lumbar facet syndrome (Bilateral) (L>R)   2. Spondylosis without myelopathy or radiculopathy, lumbosacral region   3. Lumbar facet hypertrophy (Bilateral)   4. Lumbar facet osteoarthritis (Bilateral)   5. DDD (degenerative disc disease), lumbosacral   6. Chronic low back pain (1ry area of Pain) (Bilateral) (L>R) w/o sciatica   7. Failed back surgical syndrome (Fusion L4-S1)   8. Acute postoperative pain    [name] has been dealing with the above chronic pain for longer than three months and has either failed to respond, was unable to tolerate, or simply did not get enough  benefit from other more conservative therapies including, but not limited to: 1. Over-the-counter medications 2. Anti-inflammatory medications 3. Muscle relaxants 4. Membrane stabilizers 5. Opioids 6. Physical therapy and/or chiropractic manipulation 7. Modalities (Heat, ice, etc.) 8. Invasive techniques such as nerve blocks. Mr. Kerwin has attained more than 50% relief of the pain from a series of diagnostic injections conducted in separate occasions.  Pain Score: Pre-procedure: 6 /10 Post-procedure: 0-No pain/10     Position / Prep / Materials:  Position: Prone  Prep solution: DuraPrep (Iodine Povacrylex [0.7% available iodine] and Isopropyl Alcohol, 74% w/w) Prep Area: Entire Lumbosacral Region (Lower back from mid-thoracic region to end of tailbone and from flank to flank.) Materials:  Tray: RFA (Radiofrequency) tray Needle(s):  Type: RFA (Teflon-coated radiofrequency ablation needles) Gauge (G): 22  Length: Regular (10cm) Qty: 5  Pre-op H&P Assessment:  [name] is a 61 y.o. (year old), male patient, seen today for interventional treatment. He  has a past surgical history that includes Appendectomy; repairs of bilateral ankle fractures; Spinal fusion (N/A); and Spinal cord stimulator implant (N/A). Mr. Soltau has a current medication list which includes the following prescription(s): baclofen, esomeprazole, gabapentin, hydrocodone-acetaminophen, [START ON 03/10/2022] hydrocodone-acetaminophen, ibuprofen, latanoprost, naloxone, oxycodone, [START ON 04/02/2022] oxycodone, [START ON 05/02/2022] oxycodone, oxycodone, [START ON 04/02/2022] oxycodone, and [START ON 05/02/2022] oxycodone. His primarily concern today is the Back Pain (left)  Initial Vital Signs:  Pulse/HCG Rate: 64  Temp: (!) 97.2 F (36.2 C) Resp: 17 BP: (!) 146/83 SpO2: 99 %  BMI: Estimated body mass index is 35.15 kg/m as calculated from the following:   Height as of this encounter: 5\' 10"  (1.778  m).   Weight as  of this encounter: 245 lb (111.1 kg).  Risk Assessment: Allergies: Reviewed. He has No Known Allergies.  Allergy Precautions: None required Coagulopathies: Reviewed. None identified.  Blood-thinner therapy: None at this time Active Infection(s): Reviewed. None identified. Mr. Bressler is afebrile  Site Confirmation: Mr. Jacks was asked to confirm the procedure and laterality before marking the site Procedure checklist: Completed Consent: Before the procedure and under the influence of no sedative(s), amnesic(s), or anxiolytics, the patient was informed of the treatment options, risks and possible complications. To fulfill our ethical and legal obligations, as recommended by the American Medical Association's Code of Ethics, I have informed the patient of my clinical impression; the nature and purpose of the treatment or procedure; the risks, benefits, and possible complications of the intervention; the alternatives, including doing nothing; the risk(s) and benefit(s) of the alternative treatment(s) or procedure(s); and the risk(s) and benefit(s) of doing nothing. The patient was provided information about the general risks and possible complications associated with the procedure. These may include, but are not limited to: failure to achieve desired goals, infection, bleeding, organ or nerve damage, allergic reactions, paralysis, and death. In addition, the patient was informed of those risks and complications associated to Spine-related procedures, such as failure to decrease pain; infection (i.e.: Meningitis, epidural or intraspinal abscess); bleeding (i.e.: epidural hematoma, subarachnoid hemorrhage, or any other type of intraspinal or peri-dural bleeding); organ or nerve damage (i.e.: Any type of peripheral nerve, nerve root, or spinal cord injury) with subsequent damage to sensory, motor, and/or autonomic systems, resulting in permanent pain, numbness, and/or weakness of one or several areas of the  body; allergic reactions; (i.e.: anaphylactic reaction); and/or death. Furthermore, the patient was informed of those risks and complications associated with the medications. These include, but are not limited to: allergic reactions (i.e.: anaphylactic or anaphylactoid reaction(s)); adrenal axis suppression; blood sugar elevation that in diabetics may result in ketoacidosis or comma; water retention that in patients with history of congestive heart failure may result in shortness of breath, pulmonary edema, and decompensation with resultant heart failure; weight gain; swelling or edema; medication-induced neural toxicity; particulate matter embolism and blood vessel occlusion with resultant organ, and/or nervous system infarction; and/or aseptic necrosis of one or more joints. Finally, the patient was informed that Medicine is not an exact science; therefore, there is also the possibility of unforeseen or unpredictable risks and/or possible complications that may result in a catastrophic outcome. The patient indicated having understood very clearly. We have given the patient no guarantees and we have made no promises. Enough time was given to the patient to ask questions, all of which were answered to the patient's satisfaction. Mr. Toma has indicated that he wanted to continue with the procedure. Attestation: I, the ordering provider, attest that I have discussed with the patient the benefits, risks, side-effects, alternatives, likelihood of achieving goals, and potential problems during recovery for the procedure that I have provided informed consent. Date  Time: 03/03/2022  8:04 AM  Pre-Procedure Preparation:  Monitoring: As per clinic protocol. Respiration, ETCO2, SpO2, BP, heart rate and rhythm monitor placed and checked for adequate function Safety Precautions: Patient was assessed for positional comfort and pressure points before starting the procedure. Time-out: I initiated and conducted the  "Time-out" before starting the procedure, as per protocol. The patient was asked to participate by confirming the accuracy of the "Time Out" information. Verification of the correct person, site, and procedure were performed and confirmed by me,  the nursing staff, and the patient. "Time-out" conducted as per Joint Commission's Universal Protocol (UP.01.01.01). Time: 54  Description of Procedure:          Laterality: Left Levels:  L2, L3, L4, L5, & S1 Medial Branch Level(s). Safety Precautions: Aspiration looking for blood return was conducted prior to all injections. At no point did we inject any substances, as a needle was being advanced. Before injecting, the patient was told to immediately notify me if he was experiencing any new onset of "ringing in the ears, or metallic taste in the mouth". No attempts were made at seeking any paresthesias. Safe injection practices and needle disposal techniques used. Medications properly checked for expiration dates. SDV (single dose vial) medications used. After the completion of the procedure, all disposable equipment used was discarded in the proper designated medical waste containers. Local Anesthesia: Protocol guidelines were followed. The patient was positioned over the fluoroscopy table. The area was prepped in the usual manner. The time-out was completed. The target area was identified using fluoroscopy. A 12-in long, straight, sterile hemostat was used with fluoroscopic guidance to locate the targets for each level blocked. Once located, the skin was marked with an approved surgical skin marker. Once all sites were marked, the skin (epidermis, dermis, and hypodermis), as well as deeper tissues (fat, connective tissue and muscle) were infiltrated with a small amount of a short-acting local anesthetic, loaded on a 10cc syringe with a 25G, 1.5-in  Needle. An appropriate amount of time was allowed for local anesthetics to take effect before proceeding to the next  step. Technical description of process:  Radiofrequency Ablation (RFA) L2 Medial Branch Nerve RFA: The target area for the L2 medial branch is at the junction of the postero-lateral aspect of the superior articular process and the superior, posterior, and medial edge of the transverse process of L3. Under fluoroscopic guidance, a Radiofrequency needle was inserted until contact was made with os over the superior postero-lateral aspect of the pedicular shadow (target area). Sensory and motor testing was conducted to properly adjust the position of the needle. Once satisfactory placement of the needle was achieved, the numbing solution was slowly injected after negative aspiration for blood. 2.0 mL of the nerve block solution was injected without difficulty or complication. After waiting for at least 3 minutes, the ablation was performed. Once completed, the needle was removed intact. L3 Medial Branch Nerve RFA: The target area for the L3 medial branch is at the junction of the postero-lateral aspect of the superior articular process and the superior, posterior, and medial edge of the transverse process of L4. Under fluoroscopic guidance, a Radiofrequency needle was inserted until contact was made with os over the superior postero-lateral aspect of the pedicular shadow (target area). Sensory and motor testing was conducted to properly adjust the position of the needle. Once satisfactory placement of the needle was achieved, the numbing solution was slowly injected after negative aspiration for blood. 2.0 mL of the nerve block solution was injected without difficulty or complication. After waiting for at least 3 minutes, the ablation was performed. Once completed, the needle was removed intact. L4 Medial Branch Nerve RFA: The target area for the L4 medial branch is at the junction of the postero-lateral aspect of the superior articular process and the superior, posterior, and medial edge of the transverse process  of L5. Under fluoroscopic guidance, a Radiofrequency needle was inserted until contact was made with os over the superior postero-lateral aspect of the pedicular shadow (target  area). Sensory and motor testing was conducted to properly adjust the position of the needle. Once satisfactory placement of the needle was achieved, the numbing solution was slowly injected after negative aspiration for blood. 2.0 mL of the nerve block solution was injected without difficulty or complication. After waiting for at least 3 minutes, the ablation was performed. Once completed, the needle was removed intact. L5 Medial Branch Nerve RFA: The target area for the L5 medial branch is at the junction of the postero-lateral aspect of the superior articular process of S1 and the superior, posterior, and medial edge of the sacral ala. Under fluoroscopic guidance, a Radiofrequency needle was inserted until contact was made with os over the superior postero-lateral aspect of the pedicular shadow (target area). Sensory and motor testing was conducted to properly adjust the position of the needle. Once satisfactory placement of the needle was achieved, the numbing solution was slowly injected after negative aspiration for blood. 2.0 mL of the nerve block solution was injected without difficulty or complication. After waiting for at least 3 minutes, the ablation was performed. Once completed, the needle was removed intact. S1 Medial Branch Nerve RFA: The target area for the S1 medial branch is located inferior to the junction of the S1 superior articular process and the L5 inferior articular process, posterior, inferior, and lateral to the 6 o'clock position of the L5-S1 facet joint, just superior to the S1 posterior foramen. Under fluoroscopic guidance, the Radiofrequency needle was advanced until contact was made with os over the Target area. Sensory and motor testing was conducted to properly adjust the position of the needle. Once  satisfactory placement of the needle was achieved, the numbing solution was slowly injected after negative aspiration for blood. 2.0 mL of the nerve block solution was injected without difficulty or complication. After waiting for at least 3 minutes, the ablation was performed. Once completed, the needle was removed intact. Radiofrequency lesioning (ablation):  Radiofrequency Generator: Medtronic AccurianTM AG 1000 RF Generator Sensory Stimulation Parameters: 50 Hz was used to locate & identify the nerve, making sure that the needle was positioned such that there was no sensory stimulation below 0.3 V or above 0.7 V. Motor Stimulation Parameters: 2 Hz was used to evaluate the motor component. Care was taken not to lesion any nerves that demonstrated motor stimulation of the lower extremities at an output of less than 2.5 times that of the sensory threshold, or a maximum of 2.0 V. Lesioning Technique Parameters: Standard Radiofrequency settings. (Not bipolar or pulsed.) Temperature Settings: 80 degrees C Lesioning time: 60 seconds Intra-operative Compliance: Compliant  Once the entire procedure was completed, the treated area was cleaned, making sure to leave some of the prepping solution back to take advantage of its long term bactericidal properties.    Illustration of the posterior view of the lumbar spine and the posterior neural structures. Laminae of L2 through S1 are labeled. DPRL5, dorsal primary ramus of L5; DPRS1, dorsal primary ramus of S1; DPR3, dorsal primary ramus of L3; FJ, facet (zygapophyseal) joint L3-L4; I, inferior articular process of L4; LB1, lateral branch of dorsal primary ramus of L1; IAB, inferior articular branches from L3 medial branch (supplies L4-L5 facet joint); IBP, intermediate branch plexus; MB3, medial branch of dorsal primary ramus of L3; NR3, third lumbar nerve root; S, superior articular process of L5; SAB, superior articular branches from L4 (supplies L4-5 facet  joint also); TP3, transverse process of L3.  Vitals:   03/03/22 [id number] 03/03/22 [id number] 03/03/22 0902 03/03/22  0912  BP: (!) 150/91 (!) 156/100 (!) 166/99 (!) 162/94  Pulse: (!) 57 (!) 58 63 61  Resp: 19 17 16 16   Temp:      TempSrc:      SpO2: 98% 99% 98% 99%  Weight:      Height:        Start Time: 0825 hrs. End Time: 0907 hrs.  Imaging Guidance (Spinal):          Type of Imaging Technique: Fluoroscopy Guidance (Spinal) Indication(s): Assistance in needle guidance and placement for procedures requiring needle placement in or near specific anatomical locations not easily accessible without such assistance. Exposure Time: Please see nurses notes. Contrast: None used. Fluoroscopic Guidance: I was personally present during the use of fluoroscopy. "Tunnel Vision Technique" used to obtain the best possible view of the target area. Parallax error corrected before commencing the procedure. "Direction-depth-direction" technique used to introduce the needle under continuous pulsed fluoroscopy. Once target was reached, antero-posterior, oblique, and lateral fluoroscopic projection used confirm needle placement in all planes. Images permanently stored in EMR. Interpretation: No contrast injected. I personally interpreted the imaging intraoperatively. Adequate needle placement confirmed in multiple planes. Permanent images saved into the patient's record.  Antibiotic Prophylaxis:   Anti-infectives (From admission, onward)    None      Indication(s): None identified  Post-operative Assessment:  Post-procedure Vital Signs:  Pulse/HCG Rate: 61  Temp: (!) 97.2 F (36.2 C) Resp: 16 BP: (!) 162/94 SpO2: 99 %  EBL: None  Complications: No immediate post-treatment complications observed by team, or reported by patient.  Note: The patient tolerated the entire procedure well. A repeat set of vitals were taken after the procedure and the patient was kept under observation following institutional  policy, for this type of procedure. Post-procedural neurological assessment was performed, showing return to baseline, prior to discharge. The patient was provided with post-procedure discharge instructions, including a section on how to identify potential problems. Should any problems arise concerning this procedure, the patient was given instructions to immediately contact , at any time, without hesitation. In any case, we plan to contact the patient by telephone for a follow-up status report regarding this interventional procedure.  Comments:  No additional relevant information.  Plan of Care  Orders:  Orders Placed This Encounter  Procedures   Radiofrequency,Lumbar    Scheduling Instructions:     Side(s): Left-sided     Level: L3-4, L4-5, & L5-S1 Facets (L2, L3, L4, L5, & S1 Medial Branch Nerves)     Sedation: With Sedation.     Timeframe: Today    Order Specific Question:   Where will this procedure be performed?    Answer:   [facility]   Radiofrequency,Lumbar    Standing Status:   Future    Standing Expiration Date:   06/02/2022    Scheduling Instructions:     Side(s): Right-sided     Level: L3-4, L4-5, & L5-S1 Facets (L2, L3, L4, L5, & S1 Medial Branch Nerves)     Sedation: Patient's choice.     Scheduling Timeframe: 6 weeks from now    Order Specific Question:   Where will this procedure be performed?    Answer:   [facility]   DG PAIN [facility] C-ARM 1-60 MIN NO REPORT    Intraoperative interpretation by procedural physician at [facility].    Standing Status:   Standing    Number of Occurrences:   1    Order Specific Question:  Reason for exam:    Answer:   Assistance in needle guidance and placement for procedures requiring needle placement in or near specific anatomical locations not easily accessible without such assistance.   Informed Consent Details: Physician/Practitioner Attestation; Transcribe to consent form and obtain patient signature     Nursing Order: Transcribe to consent form and obtain patient signature. Note: Always confirm laterality of pain with Mr. Ma, before procedure.    Order Specific Question:   Physician/Practitioner attestation of informed consent for procedure/surgical case    Answer:   I, the physician/practitioner, attest that I have discussed with the patient the benefits, risks, side effects, alternatives, likelihood of achieving goals and potential problems during recovery for the procedure that I have provided informed consent.    Order Specific Question:   Procedure    Answer:   Lumbar Facet Radiofrequency Ablation    Order Specific Question:   Physician/Practitioner performing the procedure    Answer:   Keyri Salberg A. Dossie Arbour, MD    Order Specific Question:   Indication/Reason    Answer:   Low Back Pain, with our without leg pain, due to Facet Joint Arthralgia (Joint Pain) known as Lumbar Facet Syndrome, secondary to Lumbar, and/or Lumbosacral Spondylosis (Arthritis of the Spine), without myelopathy or radiculopathy (Nerve Damage).   Provide equipment / supplies at bedside    Procedure tray: "Radiofrequency Tray" Additional material: Large hemostat (x1); Small hemostat (x1); Towels (x8); 4x4 sterile sponge pack (x1) Needle type: Teflon-coated Radiofrequency Needle (Disposable  single use) Size: Regular Quantity: 5    Standing Status:   Standing    Number of Occurrences:   1    Order Specific Question:   Specify    Answer:   Radiofrequency Tray   Chronic Opioid Analgesic:  Oxycodone IR 5 mg, 1 tab PO q 6 hrs (20 mg/day of oxycodone) + OxyContin 10 mg, 1 tab p.o. twice daily (20 mg/day of oxycodone) (total of 40 mg/day of oxycodone) MME/day: 60 mg/day.   Medications ordered for procedure: Meds ordered this encounter  Medications   lidocaine (XYLOCAINE) 2 % (with pres) injection 400 mg   pentafluoroprop-tetrafluoroeth (GEBAUERS) aerosol   triamcinolone acetonide (KENALOG-40) injection 40 mg    ropivacaine (PF) 2 mg/mL (0.2%) (NAROPIN) injection 9 mL   HYDROcodone-acetaminophen (NORCO/VICODIN) 5-325 MG tablet    Sig: Take 1 tablet by mouth every 8 (eight) hours as needed for up to 7 days for severe pain. Must last 7 days.    Dispense:  21 tablet    Refill:  0    For acute post-operative pain. Not to be refilled. Must last 7 days.   HYDROcodone-acetaminophen (NORCO/VICODIN) 5-325 MG tablet    Sig: Take 1 tablet by mouth every 8 (eight) hours as needed for up to 7 days for severe pain. Must last for 7 days.    Dispense:  21 tablet    Refill:  0    For acute post-operative pain. Not to be refilled. Must last 7 days.   Medications administered: We administered lidocaine, pentafluoroprop-tetrafluoroeth, triamcinolone acetonide, and ropivacaine (PF) 2 mg/mL (0.2%).  See the medical record for exact dosing, route, and time of administration.  Follow-up plan:   Return in about 2 weeks (around 03/17/2022) for (82min), (ECT): (R) L-FCT RFA #2.       Interventional Therapies  Risk  Complexity Considerations:     NOTE: Poor candidate for RFA 2ry to Hardware   Planned  Pending:   Therapeutic left lumbar facet RFA #2  Under consideration:   Diagnostic/therapeutic left L2-3 LESI #1  Diagnostic bilateral thoracolumbar facet MBB (L1-2 through L3-4) #1  Diagnostic/therapeutic bilateral L1-2 and L2-3 transforaminal ESI #1  Possible Racz procedure #1    Completed:   Diagnostic/therapeutic bilateral lumbar facet MBB x3 (04/27/2021) (100/100/50/70)  Palliative right lumbar facet RFA x1 (01/23/18) (100/100/100/75)  Palliative left lumbar facet RFA x1 (11/21/17) (100/100/50/75)  Therapeutic left caudal ESI x1 (05/14/2019) (100/100/50/100) (100% relief of LEP as of 05/26/2021) (743 days) Therapeutic bilateral erector spinae & quadratus lumborum muscle MNB/TPI x2 (04/10/2018) (100/100/0/0)   Therapeutic  Palliative (PRN) options:   Therapeutic/palliative bilateral lumbar facet MBB     Pharmacotherapy:  Nonopioids transferred 02/05/2020: Baclofen Recommendations:   None at this time.      Recent Visits Date Type Provider Dept  02/21/22 Office Visit Milinda Pointer, MD [facility]  Showing recent visits within past 90 days and meeting all other requirements Today's Visits Date Type Provider Dept  03/03/22 Procedure visit Milinda Pointer, MD [facility]  Showing today's visits and meeting all other requirements Future Appointments Date Type Provider Dept  03/17/22 Appointment Milinda Pointer, MD [facility]  05/23/22 Appointment Milinda Pointer, MD [facility]  Showing future appointments within next 90 days and meeting all other requirements  Disposition: Discharge home  Discharge (Date  Time): 03/03/2022; 0918 hrs.   Primary Care Physician: Hoyt Koch, MD Location: [place] [facility] Note by: Gaspar Cola, MD Date: 03/03/2022; Time: 9:51 AM  Disclaimer:  Medicine is not an Chief Strategy Officer. The only guarantee in medicine is that nothing is guaranteed. It is important to note that the decision to proceed with this intervention was based on the information collected from the patient. The Data and conclusions were drawn from the patient's questionnaire, the interview, and the physical examination. Because the information was provided in large part by the patient, it cannot be guaranteed that it has not been purposely or unconsciously manipulated. Every effort has been made to obtain as much relevant data as possible for this evaluation. It is important to note that the conclusions that lead to this procedure are derived in large part from the available data. Always take into account that the treatment will also be dependent on availability of resources and existing treatment guidelines, considered by other Pain Management Practitioners as being common knowledge and practice, at the  time of the intervention. For Medico-Legal purposes, it is also important to point out that variation in procedural techniques and pharmacological choices are the acceptable norm. The indications, contraindications, technique, and results of the above procedure should only be interpreted and judged by a Board-Certified Interventional Pain Specialist with extensive familiarity and expertise in the same exact procedure and technique.

## 2022-03-03 NOTE — Progress Notes (Signed)
Safety precautions to be maintained throughout the outpatient stay will include: orient to surroundings, keep bed in low position, maintain call bell within reach at all times, provide assistance with transfer out of bed and ambulation.  

## 2022-03-03 NOTE — Patient Instructions (Signed)
___________________________________________________________________________________________  Post-Radiofrequency (RF) Discharge Instructions  You have just completed a Radiofrequency Neurotomy.  The following instructions will provide you with information and guidelines for self-care upon discharge.  If at any time you have questions or concerns please call your physician. DO NOT DRIVE YOURSELF!!  Instructions:  Apply ice: Fill a plastic sandwich bag with crushed ice. Cover it with a small towel and apply to injection site. Apply for 15 minutes then remove x 15 minutes. Repeat sequence on day of procedure, until you go to bed. The purpose is to minimize swelling and discomfort after procedure.  Apply heat: Apply heat to procedure site starting the day following the procedure. The purpose is to treat any soreness and discomfort from the procedure.  Food intake: No eating limitations, unless stipulated above.  Nevertheless, if you have had sedation, you may experience some nausea.  In this case, it may be wise to wait at least two hours prior to resuming regular diet.  Physical activities: Keep activities to a minimum for the first 8 hours after the procedure. For the first 24 hours after the procedure, do not drive a motor vehicle,  Operate heavy machinery, power tools, or handle any weapons.  Consider walking with the use of an assistive device or accompanied by an adult for the first 24 hours.  Do not drink alcoholic beverages including beer.  Do not make any important decisions or sign any legal documents. Go home and rest today.  Resume activities tomorrow, as tolerated.  Use caution in moving about as you may experience mild leg weakness.  Use caution in cooking, use of household electrical appliances and climbing steps.  Driving: If you have received any sedation, you are not allowed to drive for 24 hours after your procedure.  Blood thinner: Restart your blood thinner 6 hours after your  procedure. (Only for those taking blood thinners)  Insulin: As soon as you can eat, you may resume your normal dosing schedule. (Only for those taking insulin)  Medications: May resume pre-procedure medications.  Do not take any drugs, other than what has been prescribed to you.  Infection prevention: Keep procedure site clean and dry.  Post-procedure Pain Diary: Extremely important that this be done correctly and accurately. Recorded information will be used to determine the next step in treatment.  Pain evaluated is that of treated area only. Do not include pain from an untreated area.  Complete every hour, on the hour, for the initial 8 hours. Set an alarm to help you do this part accurately.  Do not go to sleep and have it completed later. It will not be accurate.  Follow-up appointment: Keep your follow-up appointment after the procedure. Usually 2-6 weeks after radiofrequency. Bring you pain diary. The information collected will be essential for your long-term care.   Expect:  From numbing medicine (AKA: Local Anesthetics): Numbness or decrease in pain.  Onset: Full effect within 15 minutes of injected.  Duration: It will depend on the type of local anesthetic used. On the average, 1 to 8 hours.   From steroids (when added): Decrease in swelling or inflammation. Once inflammation is improved, relief of the pain will follow.  Onset of benefits: Depends on the amount of swelling present. The more swelling, the longer it will take for the benefits to be seen. In some cases, up to 10 days.  Duration: Steroids will stay in the system x 2 weeks. Duration of benefits will depend on multiple posibilities including persistent irritating factors.    this may last as long as 6 weeks. Additional post-procedure pain medication is provided for this. Discomfort is minimized if ice and heat are applied as  instructed.  Call if: You experience numbness and weakness that gets worse with time, as opposed to wearing off. He experience any unusual bleeding, difficulty breathing, or loss of the ability to control your bowel and bladder. (This applies to Spinal procedures only) You experience any redness, swelling, heat, red streaks, elevated temperature, fever, or any other signs of a possible infection.  Emergency Numbers: Durning business hours (Monday - Thursday, 8:00 AM - 4:00 PM) (Friday, 9:00 AM - 12:00 Noon): [phone] After hours: [phone] ____________________________________________________________________________________________    ____________________________________________________________________________________________  Virtual Visits   What is a "Virtual Visit"? It is a Mining engineer (medical visit) that takes place on real time (NOT TEXT or E-MAIL) over the telephone or computer device (desktop, laptop, tablet, smart phone, etc.). It allows for more location flexibility between the patient and the healthcare provider.  Who decides when these types of visits will be used? The physician.  Who is eligible for these types of visits? Only those patients that can be reliably reached over the telephone.  What do you mean by reliably? We do not have time to call everyone multiple times, therefore those that tend to screen calls and then call back later are not suitable candidates for this system. We understand how people are reluctant to pickup on "unknown" calls, therefore, we suggest adding our telephone numbers to your list of "CONTACT(s)". This way, you should be able to readily identify our calls when you receive one. All of our numbers are available below.   Who is not eligible? This option is not available for medication management encounters, specially for controlled substances. Patients on pain medications that fall under the category of  controlled substances have to come in for "Face-to-Face" encounters. This is required for mandatory monitoring of these substances. You may be asked to provide a sample for an unannounced urine drug screening test (UDS), and we will need to count your pain pills. Not bringing your pills to be counted may result in no refill. Obviously, neither one of these can be done over the phone.  When will this type of visits be used? You can request a virtual visit whenever you are physically unable to attend a regular appointment. The decision will be made by the physician (or healthcare provider) on a case by case basis.   At what time will I be called? This is an excellent question. The providers will try to call you whenever they have time available. Do not expect to be called at any specific time. The secretaries will assign you a time for your virtual visit appointment, but this is done simply to keep a list of those patients that need to be called, but not for the purpose of keeping a time schedule. Be advised that the call may come in anytime during the day, between the hours of 8:00 AM and 8::00 PM, depending on provider availability. We do understand that the system is not perfect. If you are unable to be available that day on a moments notice, then request an "in-person" appointment rather than a "virtual visit".  Can I request my medication visits to be "Virtual"? Yes you may request it, but the decision is entirely up to the healthcare provider. Control substances require specific monitoring that requires Face-to-Face encounters. The number of encounters  and the extent of  the monitoring is determined on a case by case basis.  Add a new contact to your smart phone and label it "PAIN CLINIC" Under this contact add the following numbers: Main: [phone] (Official Contact Number) Nurses: [phone] (These are outgoing only calling systems. Do not call this number.) Dr. Laban Emperor: [phone]  or [phone] (Outgoing calls only. Do not call this number.)  ____________________________________________________________________________________________    ____________________________________________________________________________________________  Patient Information update  To: All of our patients.  Re: Name change.  It has been made official that our current name, "[facility]"   will soon be changed to "[facility]".   The purpose of this change is to eliminate any confusion created by the concept of our practice being a "Medication Management Pain Clinic". In the past this has led to the misconception that we treat pain primarily by the use of prescription medications.  Nothing can be farther from the truth.   Understanding PAIN MANAGEMENT: To further understand what our practice does, you first have to understand that "Pain Management" is a subspecialty that requires additional training once a physician has completed their specialty training, which can be in either Anesthesia, Neurology, Psychiatry, or Physical Medicine and Rehabilitation (PMR). Each one of these contributes to the final approach taken by each physician to the management of their patient's pain. To be a "Pain Management Specialist" you must have first completed one of the specialty trainings below.  Anesthesiologists - trained in clinical pharmacology and interventional techniques such as nerve blockade and regional as well as central neuroanatomy. They are trained to block pain before, during, and after surgical interventions.  Neurologists - trained in the diagnosis and pharmacological treatment of complex neurological conditions, such as Multiple Sclerosis, Parkinson's, spinal cord injuries, and other systemic conditions that may be associated with symptoms that may include but are not limited to pain.  They tend to rely primarily on the treatment of chronic pain using prescription medications.  Psychiatrist - trained in conditions affecting the psychosocial wellbeing of patients including but not limited to depression, anxiety, schizophrenia, personality disorders, addiction, and other substance use disorders that may be associated with chronic pain. They tend to rely primarily on the treatment of chronic pain using prescription medications.   Physical Medicine and Rehabilitation (PMR) physicians, also known as physiatrists - trained to treat a wide variety of medical conditions affecting the brain, spinal cord, nerves, bones, joints, ligaments, muscles, and tendons. Their training is primarily aimed at treating patients that have suffered injuries that have caused severe physical impairment. Their training is primarily aimed at the physical therapy and rehabilitation of those patients. They may also work alongside orthopedic surgeons or neurosurgeons using their expertise in assisting surgical patients to recover after their surgeries.  INTERVENTIONAL PAIN MANAGEMENT is sub-subspecialty of Pain Management.  Our physicians are Board-certified in Anesthesia, Pain Management, and Interventional Pain Management.  This meaning that not only have they been trained and Board-certified in their specialty of Anesthesia, and subspecialty of Pain Management, but they have also received further training in the sub-subspecialty of Interventional Pain Management, in order to become Board-certified as INTERVENTIONAL PAIN MANAGEMENT SPECIALIST.    Mission: Our goal is to use our skills in  INTERVENTIONAL PAIN MANAGEMENT as alternatives to the chronic use of prescription opioid medications for the treatment of pain. To make this more clear, we have changed our name to reflect what we do and offer.  We will continue to offer medication management assessment and recommendations, but we will not be taking over any patient's  medication management.  ____________________________________________________________________________________________     ______________________________________________________________________  Preparing for your procedure  During your procedure appointment there will be: No Prescription Refills. No disability issues to discussed. No medication changes or discussions.  Instructions: Food intake: Avoid eating anything solid for at least 8 hours prior to your procedure. Clear liquid intake: You may take clear liquids such as water up to 2 hours prior to your procedure. (No carbonated drinks. No soda.) Transportation: Unless otherwise stated by your physician, bring a driver. Morning Medicines: Except for blood thinners, take all of your other morning medications with a sip of water. Make sure to take your heart and blood pressure medicines. If your blood pressure's lower number is above 100, the case will be rescheduled. Blood thinners: If you take a blood thinner, but were not instructed to stop it, call our office [phone] and ask to talk to a nurse. Not stopping a blood thinner prior to certain procedures could lead to serious complications. Diabetics on insulin: Notify the staff so that you can be scheduled 1st case in the morning. If your diabetes requires high dose insulin, take only  of your normal insulin dose the morning of the procedure and notify the staff that you have done so. Preventing infections: Shower with an antibacterial soap the morning of your procedure.  Build-up your immune system: Take 1000 mg of Vitamin C with every meal (3 times a day) the day prior to your procedure. Antibiotics: Inform the nursing staff if you are taking any antibiotics or if you have any conditions that may require antibiotics prior to procedures. (Example: recent joint implants)   Pregnancy: If you are pregnant make sure to notify the nursing staff. Not doing so may result in injury to the  fetus, including death.  Sickness: If you have a cold, fever, or any active infections, call and cancel or reschedule your procedure. Receiving steroids while having an infection may result in complications. Arrival: You must be in the facility at least 30 minutes prior to your scheduled procedure. Tardiness: Your scheduled time is also the cutoff time. If you do not arrive at least 15 minutes prior to your procedure, you will be rescheduled.  Children: Do not bring any children with you. Make arrangements to keep them home. Dress appropriately: There is always a possibility that your clothing may get soiled. Avoid long dresses. Valuables: Do not bring any jewelry or valuables.  Reasons to call and reschedule or cancel your procedure: (Following these recommendations will minimize the risk of a serious complication.) Surgeries: Avoid having procedures within 2 weeks of any surgery. (Avoid for 2 weeks before or after any surgery). Flu Shots: Avoid having procedures within 2 weeks of a flu shots or . (Avoid for 2 weeks before or after immunizations). Barium: Avoid having a procedure within 7-10 days after having had a radiological study involving the use of radiological contrast. (Myelograms, Barium swallow or enema study). Heart attacks: Avoid any elective procedures or surgeries for the initial 6 months after a "Myocardial Infarction" (Heart Attack). Blood thinners: It is imperative that you stop these medications before procedures. Let us know if you if you take any blood thinner.  Infection: Avoid procedures during or within two weeks of an infection (including chest colds or gastrointestinal problems). Symptoms associated with infections include: Localized redness, fever, chills, night sweats or profuse sweating, burning sensation  when voiding, cough, congestion, stuffiness, runny nose, sore throat, diarrhea, nausea, vomiting, cold or Flu symptoms, recent or current infections. It is specially  important if the infection is over the area that we intend to treat. Heart and lung problems: Symptoms that may suggest an active cardiopulmonary problem include: cough, chest pain, breathing difficulties or shortness of breath, dizziness, ankle swelling, uncontrolled high or unusually low blood pressure, and/or palpitations. If you are experiencing any of these symptoms, cancel your procedure and contact your primary care physician for an evaluation.  Remember:  Regular Business hours are:  Monday to Thursday 8:00 AM to 4:00 PM  Provider's Schedule: Delano MetzFrancisco Zakaiya Lares, MD:  Procedure days: Tuesday and Thursday 7:30 AM to 4:00 PM  Edward JollyBilal Lateef, MD:  Procedure days: Monday and Wednesday 7:30 AM to 4:00 PM  ______________________________________________________________________    ____________________________________________________________________________________________  General Risks and Possible Complications  Patient Responsibilities: It is important that you read this as it is part of your informed consent. It is our duty to inform you of the risks and possible complications associated with treatments offered to you. It is your responsibility as a patient to read this and to ask questions about anything that is not clear or that you believe was not covered in this document.  Patient's Rights: You have the right to refuse treatment. You also have the right to change your mind, even after initially having agreed to have the treatment done. However, under this last option, if you wait until the last second to change your mind, you may be charged for the materials used up to that point.  Introduction: Medicine is not an Visual merchandiserexact science. Everything in Medicine, including the lack of treatment(s), carries the potential for danger, harm, or loss (which is by definition: Risk). In Medicine, a complication is a secondary problem, condition, or disease that can aggravate an already existing one. All  treatments carry the risk of possible complications. The fact that a side effects or complications occurs, does not imply that the treatment was conducted incorrectly. It must be clearly understood that these can happen even when everything is done following the highest safety standards.  No treatment: You can choose not to proceed with the proposed treatment alternative. The "PRO(s)" would include: avoiding the risk of complications associated with the therapy. The "CON(s)" would include: not getting any of the treatment benefits. These benefits fall under one of three categories: diagnostic; therapeutic; and/or palliative. Diagnostic benefits include: getting information which can ultimately lead to improvement of the disease or symptom(s). Therapeutic benefits are those associated with the successful treatment of the disease. Finally, palliative benefits are those related to the decrease of the primary symptoms, without necessarily curing the condition (example: decreasing the pain from a flare-up of a chronic condition, such as incurable terminal cancer).  General Risks and Complications: These are associated to most interventional treatments. They can occur alone, or in combination. They fall under one of the following six (6) categories: no benefit or worsening of symptoms; bleeding; infection; nerve damage; allergic reactions; and/or death. No benefits or worsening of symptoms: In Medicine there are no guarantees, only probabilities. No healthcare provider can ever guarantee that a medical treatment will work, they can only state the probability that it may. Furthermore, there is always the possibility that the condition may worsen, either directly, or indirectly, as a consequence of the treatment. Bleeding: This is more common if the patient is taking a blood thinner, either prescription or over the counter (example: Marlin CanaryGoody  Powders, Fish oil, Aspirin, Garlic, etc.), or if suffering a condition  associated with impaired coagulation (example: Hemophilia, cirrhosis of the liver, low platelet counts, etc.). However, even if you do not have one on these, it can still happen. If you have any of these conditions, or take one of these drugs, make sure to notify your treating physician. Infection: This is more common in patients with a compromised immune system, either due to disease (example: diabetes, cancer, human immunodeficiency virus [HIV], etc.), or due to medications or treatments (example: therapies used to treat cancer and rheumatological diseases). However, even if you do not have one on these, it can still happen. If you have any of these conditions, or take one of these drugs, make sure to notify your treating physician. Nerve Damage: This is more common when the treatment is an invasive one, but it can also happen with the use of medications, such as those used in the treatment of cancer. The damage can occur to small secondary nerves, or to large primary ones, such as those in the spinal cord and brain. This damage may be temporary or permanent and it may lead to impairments that can range from temporary numbness to permanent paralysis and/or brain death. Allergic Reactions: Any time a substance or material comes in contact with our body, there is the possibility of an allergic reaction. These can range from a mild skin rash (contact dermatitis) to a severe systemic reaction (anaphylactic reaction), which can result in death. Death: In general, any medical intervention can result in death, most of the time due to an unforeseen complication. ____________________________________________________________________________________________

## 2022-03-13 NOTE — Progress Notes (Deleted)
PROVIDER NOTE: Interpretation of information contained herein should be left to medically-trained personnel. Specific patient instructions are provided elsewhere under "Patient Instructions" section of medical record. This document was created in part using STT-dictation technology, any transcriptional errors that may result from this process are unintentional.  Patient: Lance Roman Type: Established DOB: 10/19/1960 MRN: [id number] PCP: Lance Broker, MD  Service: Procedure DOS: 03/17/2022 Setting: Ambulatory Location: Ambulatory outpatient facility Delivery: Face-to-face Provider: Oswaldo Done, MD Specialty: Interventional Pain Management Specialty designation: 09 Location: Outpatient facility Ref. Prov.: Lance Roman, *    Procedure:           Type: Lumbar Facet, Medial Branch Radiofrequency Ablation (RFA) #2  Laterality: Right (-RT)  Level: L2, L3, L4, L5, & S1 Medial Branch Level(s). These levels will denervate the L3-4, L4-5 and L5-S1 lumbar facet joints.  Imaging: Fluoroscopy-guided         Anesthesia: Local anesthesia (1-2% Lidocaine) Anxiolysis: IV Versed         Sedation:                         DOS: 03/17/2022  Performed by: Lance Done, MD  Purpose: Therapeutic/Palliative Indications: Low back pain severe enough to impact quality of life or function. Indications: No diagnosis found. Lance Roman has been dealing with the above chronic pain for longer than three months and has either failed to respond, was unable to tolerate, or simply did not get enough benefit from other more conservative therapies including, but not limited to: 1. Over-the-counter medications 2. Anti-inflammatory medications 3. Muscle relaxants 4. Membrane stabilizers 5. Opioids 6. Physical therapy and/or chiropractic manipulation 7. Modalities (Heat, ice, etc.) 8. Invasive techniques such as nerve blocks. Lance Roman has attained more than 50% relief of the pain from  Roman series of diagnostic injections conducted in separate occasions.  Pain Score: Pre-procedure:  /10 Post-procedure:  /10     Position / Prep / Materials:  Position: Prone  Prep solution: DuraPrep (Iodine Povacrylex [0.7% available iodine] and Isopropyl Alcohol, 74% w/w) Prep Area: Entire Lumbosacral Region (Lower back from mid-thoracic region to end of tailbone and from flank to flank.) Materials:  Tray: RFA (Radiofrequency) tray Needle(s):  Type: RFA (Teflon-coated radiofrequency ablation needles) Gauge (G): 20  Length: Long (15cm) Qty: 5  Pre-op H&P Assessment:  Lance Roman is Roman 61 y.o. (year old), male patient, seen today for interventional treatment. He  has Roman past surgical history that includes Appendectomy; repairs of bilateral ankle fractures; Spinal fusion (N/Roman); and Spinal cord stimulator implant (N/Roman). Lance Roman has Roman current medication list which includes the following prescription(s): baclofen, esomeprazole, gabapentin, hydrocodone-acetaminophen, ibuprofen, latanoprost, naloxone, oxycodone, oxycodone, [START ON 04/02/2022] oxycodone, [START ON 05/02/2022] oxycodone, oxycodone, [START ON 04/02/2022] oxycodone, [START ON 05/02/2022] oxycodone, and oxycontin. His primarily concern today is the No chief complaint on file.  Initial Vital Signs:  Pulse/HCG Rate:    Temp:   Resp:   BP:   SpO2:    BMI: Estimated body mass index is 35.15 kg/m as calculated from the following:   Height as of 03/03/22: 5\' 10"  (1.778 m).   Weight as of 03/03/22: 245 lb (111.1 kg).  Risk Assessment: Allergies: Reviewed. He has No Known Allergies.  Allergy Precautions: None required Coagulopathies: Reviewed. None identified.  Blood-thinner therapy: None at this time Active Infection(s): Reviewed. None identified. Lance Roman is afebrile  Site Confirmation: Lance Roman was asked to confirm the procedure and laterality before  marking the site Procedure checklist: Completed Consent: Before the procedure  and under the influence of no sedative(s), amnesic(s), or anxiolytics, the patient was informed of the treatment options, risks and possible complications. To fulfill our ethical and legal obligations, as recommended by the American Medical Association's Code of Ethics, I have informed the patient of my clinical impression; the nature and purpose of the treatment or procedure; the risks, benefits, and possible complications of the intervention; the alternatives, including doing nothing; the risk(s) and benefit(s) of the alternative treatment(s) or procedure(s); and the risk(s) and benefit(s) of doing nothing. The patient was provided information about the general risks and possible complications associated with the procedure. These may include, but are not limited to: failure to achieve desired goals, infection, bleeding, organ or nerve damage, allergic reactions, paralysis, and death. In addition, the patient was informed of those risks and complications associated to Spine-related procedures, such as failure to decrease pain; infection (i.e.: Meningitis, epidural or intraspinal abscess); bleeding (i.e.: epidural hematoma, subarachnoid hemorrhage, or any other type of intraspinal or peri-dural bleeding); organ or nerve damage (i.e.: Any type of peripheral nerve, nerve root, or spinal cord injury) with subsequent damage to sensory, motor, and/or autonomic systems, resulting in permanent pain, numbness, and/or weakness of one or several areas of the body; allergic reactions; (i.e.: anaphylactic reaction); and/or death. Furthermore, the patient was informed of those risks and complications associated with the medications. These include, but are not limited to: allergic reactions (i.e.: anaphylactic or anaphylactoid reaction(s)); adrenal axis suppression; blood sugar elevation that in diabetics may result in ketoacidosis or comma; water retention that in patients with history of congestive heart failure may result  in shortness of breath, pulmonary edema, and decompensation with resultant heart failure; weight gain; swelling or edema; medication-induced neural toxicity; particulate matter embolism and blood vessel occlusion with resultant organ, and/or nervous system infarction; and/or aseptic necrosis of one or more joints. Finally, the patient was informed that Medicine is not an exact science; therefore, there is also the possibility of unforeseen or unpredictable risks and/or possible complications that may result in Roman catastrophic outcome. The patient indicated having understood very clearly. We have given the patient no guarantees and we have made no promises. Enough time was given to the patient to ask questions, all of which were answered to the patient's satisfaction. Mr. Harriott has indicated that he wanted to continue with the procedure. Attestation: I, the ordering provider, attest that I have discussed with the patient the benefits, risks, side-effects, alternatives, likelihood of achieving goals, and potential problems during recovery for the procedure that I have provided informed consent. Date  Time: {CHL [facility]-PAIN TIME CHOICES:[id number]}  Pre-Procedure Preparation:  Monitoring: As per clinic protocol. Respiration, ETCO2, SpO2, BP, heart rate and rhythm monitor placed and checked for adequate function Safety Precautions: Patient was assessed for positional comfort and pressure points before starting the procedure. Time-out: I initiated and conducted the "Time-out" before starting the procedure, as per protocol. The patient was asked to participate by confirming the accuracy of the "Time Out" information. Verification of the correct person, site, and procedure were performed and confirmed by me, the nursing staff, and the patient. "Time-out" conducted as per Joint Commission's Universal Protocol (UP.01.01.01). Time:    Description of Procedure:          Laterality: Right Levels:  L2, L3, L4, L5, & S1  Medial Branch Level(s). Safety Precautions: Aspiration looking for blood return was conducted prior to all injections. At no point did  we inject any substances, as Roman needle was being advanced. Before injecting, the patient was told to immediately notify me if he was experiencing any new onset of "ringing in the ears, or metallic taste in the mouth". No attempts were made at seeking any paresthesias. Safe injection practices and needle disposal techniques used. Medications properly checked for expiration dates. SDV (single dose vial) medications used. After the completion of the procedure, all disposable equipment used was discarded in the proper designated medical waste containers. Local Anesthesia: Protocol guidelines were followed. The patient was positioned over the fluoroscopy table. The area was prepped in the usual manner. The time-out was completed. The target area was identified using fluoroscopy. Roman 12-in long, straight, sterile hemostat was used with fluoroscopic guidance to locate the targets for each level blocked. Once located, the skin was marked with an approved surgical skin marker. Once all sites were marked, the skin (epidermis, dermis, and hypodermis), as well as deeper tissues (fat, connective tissue and muscle) were infiltrated with Roman small amount of Roman short-acting local anesthetic, loaded on Roman 10cc syringe with Roman 25G, 1.5-in  Needle. An appropriate amount of time was allowed for local anesthetics to take effect before proceeding to the next step. Technical description of process:  Radiofrequency Ablation (RFA) L2 Medial Branch Nerve RFA: The target area for the L2 medial branch is at the junction of the postero-lateral aspect of the superior articular process and the superior, posterior, and medial edge of the transverse process of L3. Under fluoroscopic guidance, Roman Radiofrequency needle was inserted until contact was made with os over the superior postero-lateral aspect of the pedicular  shadow (target area). Sensory and motor testing was conducted to properly adjust the position of the needle. Once satisfactory placement of the needle was achieved, the numbing solution was slowly injected after negative aspiration for blood. 2.0 mL of the nerve block solution was injected without difficulty or complication. After waiting for at least 3 minutes, the ablation was performed. Once completed, the needle was removed intact. L3 Medial Branch Nerve RFA: The target area for the L3 medial branch is at the junction of the postero-lateral aspect of the superior articular process and the superior, posterior, and medial edge of the transverse process of L4. Under fluoroscopic guidance, Roman Radiofrequency needle was inserted until contact was made with os over the superior postero-lateral aspect of the pedicular shadow (target area). Sensory and motor testing was conducted to properly adjust the position of the needle. Once satisfactory placement of the needle was achieved, the numbing solution was slowly injected after negative aspiration for blood. 2.0 mL of the nerve block solution was injected without difficulty or complication. After waiting for at least 3 minutes, the ablation was performed. Once completed, the needle was removed intact. L4 Medial Branch Nerve RFA: The target area for the L4 medial branch is at the junction of the postero-lateral aspect of the superior articular process and the superior, posterior, and medial edge of the transverse process of L5. Under fluoroscopic guidance, Roman Radiofrequency needle was inserted until contact was made with os over the superior postero-lateral aspect of the pedicular shadow (target area). Sensory and motor testing was conducted to properly adjust the position of the needle. Once satisfactory placement of the needle was achieved, the numbing solution was slowly injected after negative aspiration for blood. 2.0 mL of the nerve block solution was injected  without difficulty or complication. After waiting for at least 3 minutes, the ablation was performed. Once completed,  the needle was removed intact. L5 Medial Branch Nerve RFA: The target area for the L5 medial branch is at the junction of the postero-lateral aspect of the superior articular process of S1 and the superior, posterior, and medial edge of the sacral ala. Under fluoroscopic guidance, Roman Radiofrequency needle was inserted until contact was made with os over the superior postero-lateral aspect of the pedicular shadow (target area). Sensory and motor testing was conducted to properly adjust the position of the needle. Once satisfactory placement of the needle was achieved, the numbing solution was slowly injected after negative aspiration for blood. 2.0 mL of the nerve block solution was injected without difficulty or complication. After waiting for at least 3 minutes, the ablation was performed. Once completed, the needle was removed intact. S1 Medial Branch Nerve RFA: The target area for the S1 medial branch is located inferior to the junction of the S1 superior articular process and the L5 inferior articular process, posterior, inferior, and lateral to the 6 o'clock position of the L5-S1 facet joint, just superior to the S1 posterior foramen. Under fluoroscopic guidance, the Radiofrequency needle was advanced until contact was made with os over the Target area. Sensory and motor testing was conducted to properly adjust the position of the needle. Once satisfactory placement of the needle was achieved, the numbing solution was slowly injected after negative aspiration for blood. 2.0 mL of the nerve block solution was injected without difficulty or complication. After waiting for at least 3 minutes, the ablation was performed. Once completed, the needle was removed intact. Radiofrequency lesioning (ablation):  Radiofrequency Generator: Medtronic AccurianTM AG 1000 RF Generator Sensory Stimulation  Parameters: 50 Hz was used to locate & identify the nerve, making sure that the needle was positioned such that there was no sensory stimulation below 0.3 V or above 0.7 V. Motor Stimulation Parameters: 2 Hz was used to evaluate the motor component. Care was taken not to lesion any nerves that demonstrated motor stimulation of the lower extremities at an output of less than 2.5 times that of the sensory threshold, or Roman maximum of 2.0 V. Lesioning Technique Parameters: Standard Radiofrequency settings. (Not bipolar or pulsed.) Temperature Settings: 80 degrees C Lesioning time: 60 seconds Intra-operative Compliance: Compliant  Once the entire procedure was completed, the treated area was cleaned, making sure to leave some of the prepping solution back to take advantage of its long term bactericidal properties.    Illustration of the posterior view of the lumbar spine and the posterior neural structures. Laminae of L2 through S1 are labeled. DPRL5, dorsal primary ramus of L5; DPRS1, dorsal primary ramus of S1; DPR3, dorsal primary ramus of L3; FJ, facet (zygapophyseal) joint L3-L4; I, inferior articular process of L4; LB1, lateral branch of dorsal primary ramus of L1; IAB, inferior articular branches from L3 medial branch (supplies L4-L5 facet joint); IBP, intermediate branch plexus; MB3, medial branch of dorsal primary ramus of L3; NR3, third lumbar nerve root; S, superior articular process of L5; SAB, superior articular branches from L4 (supplies L4-5 facet joint also); TP3, transverse process of L3.  There were no vitals filed for this visit.  Start Time:   hrs. End Time:   hrs.  Imaging Guidance (Spinal):          Type of Imaging Technique: Fluoroscopy Guidance (Spinal) Indication(s): Assistance in needle guidance and placement for procedures requiring needle placement in or near specific anatomical locations not easily accessible without such assistance. Exposure Time: Please see nurses  notes. Contrast:  None used. Fluoroscopic Guidance: I was personally present during the use of fluoroscopy. "Tunnel Vision Technique" used to obtain the best possible view of the target area. Parallax error corrected before commencing the procedure. "Direction-depth-direction" technique used to introduce the needle under continuous pulsed fluoroscopy. Once target was reached, antero-posterior, oblique, and lateral fluoroscopic projection used confirm needle placement in all planes. Images permanently stored in EMR. Interpretation: No contrast injected. I personally interpreted the imaging intraoperatively. Adequate needle placement confirmed in multiple planes. Permanent images saved into the patient's record.  Antibiotic Prophylaxis:   Anti-infectives (From admission, onward)    None      Indication(s): None identified  Post-operative Assessment:  Post-procedure Vital Signs:  Pulse/HCG Rate:    Temp:   Resp:   BP:   SpO2:    EBL: None  Complications: No immediate post-treatment complications observed by team, or reported by patient.  Note: The patient tolerated the entire procedure well. Roman repeat set of vitals were taken after the procedure and the patient was kept under observation following institutional policy, for this type of procedure. Post-procedural neurological assessment was performed, showing return to baseline, prior to discharge. The patient was provided with post-procedure discharge instructions, including Roman section on how to identify potential problems. Should any problems arise concerning this procedure, the patient was given instructions to immediately contact us, at any time, without hesitation. In any case, we plan to contact the patient by telephone for Roman follow-up status report regarding this interventional procedure.  Comments:  No additional relevant information.  Plan of Care  Orders:  No orders of the defined types were placed in this encounter.  Chronic  Opioid Analgesic:  Oxycodone IR 5 mg, 1 tab PO q 6 hrs (20 mg/day of oxycodone) + OxyContin 10 mg, 1 tab p.o. twice daily (20 mg/day of oxycodone) (total of 40 mg/day of oxycodone) MME/day: 60 mg/day.   Medications ordered for procedure: No orders of the defined types were placed in this encounter.  Medications administered: Darlina RumpfSamuel C. Heal "Genevie CheshireClyde" had no medications administered during this visit.  See the medical record for exact dosing, route, and time of administration.  Follow-up plan:   No follow-ups on file.       Interventional Therapies  Risk  Complexity Considerations:     NOTE: Poor candidate for RFA 2ry to Hardware   Planned  Pending:   Therapeutic left lumbar facet RFA #2    Under consideration:   Diagnostic/therapeutic left L2-3 LESI #1  Diagnostic bilateral thoracolumbar facet MBB (L1-2 through L3-4) #1  Diagnostic/therapeutic bilateral L1-2 and L2-3 transforaminal ESI #1  Possible Racz procedure #1    Completed:   Diagnostic/therapeutic bilateral lumbar facet MBB x3 (04/27/2021) (100/100/50/70)  Palliative right lumbar facet RFA x1 (01/23/18) (100/100/100/75)  Palliative left lumbar facet RFA x1 (11/21/17) (100/100/50/75)  Therapeutic left caudal ESI x1 (05/14/2019) (100/100/50/100) (100% relief of LEP as of 05/26/2021) (743 days) Therapeutic bilateral erector spinae & quadratus lumborum muscle MNB/TPI x2 (04/10/2018) (100/100/0/0)   Therapeutic  Palliative (PRN) options:   Therapeutic/palliative bilateral lumbar facet MBB    Pharmacotherapy:  Nonopioids transferred 02/05/2020: Baclofen Recommendations:   None at this time.       Recent Visits Date Type Provider Dept  03/03/22 Procedure visit Delano MetzNaveira, Sinthia Karabin, MD [facility]  02/21/22 Office Visit Delano MetzNaveira, Holdyn Poyser, MD [facility]  Showing recent visits within past 90 days and meeting all other requirements Future Appointments Date Type Provider Dept  03/17/22 Appointment  Delano MetzNaveira, Mieshia Pepitone, MD  [facility]  05/23/22 Appointment Delano Metz, MD [facility]  Showing future appointments within next 90 days and meeting all other requirements  Disposition: Discharge home  Discharge (Date  Time): 03/17/2022;   hrs.   Primary Care Physician: Lance Broker, MD Location: [facility] Note by: Lance Done, MD Date: 03/17/2022; Time: 10:59 AM  Disclaimer:  Medicine is not an Visual merchandiser. The only guarantee in medicine is that nothing is guaranteed. It is important to note that the decision to proceed with this intervention was based on the information collected from the patient. The Data and conclusions were drawn from the patient's questionnaire, the interview, and the physical examination. Because the information was provided in large part by the patient, it cannot be guaranteed that it has not been purposely or unconsciously manipulated. Every effort has been made to obtain as much relevant data as possible for this evaluation. It is important to note that the conclusions that lead to this procedure are derived in large part from the available data. Always take into account that the treatment will also be dependent on availability of resources and existing treatment guidelines, considered by other Pain Management Practitioners as being common knowledge and practice, at the time of the intervention. For Medico-Legal purposes, it is also important to point out that variation in procedural techniques and pharmacological choices are the acceptable norm. The indications, contraindications, technique, and results of the above procedure should only be interpreted and judged by Roman Board-Certified Interventional Pain Specialist with extensive familiarity and expertise in the same exact procedure and technique.

## 2022-03-17 ENCOUNTER — Ambulatory Visit: Payer: BC Managed Care – PPO | Admitting: Pain Medicine

## 2022-03-28 NOTE — Progress Notes (Unsigned)
PROVIDER NOTE: Interpretation of information contained herein should be left to medically-trained personnel. Specific patient instructions are provided elsewhere under "Patient Instructions" section of medical record. This document was created in part using STT-dictation technology, any transcriptional errors that may result from this process are unintentional.  Patient: Lance Roman Type: Established DOB: 1960-06-11 MRN: [id number] PCP: Myrlene Broker, MD  Service: Procedure DOS: 03/29/2022 Setting: Ambulatory Location: Ambulatory outpatient facility Delivery: Face-to-face Provider: Oswaldo Done, MD Specialty: Interventional Pain Management Specialty designation: 09 Location: Outpatient facility Ref. Prov.: Hillard Danker A, *    Procedure:           Type: Lumbar Facet, Medial Branch Radiofrequency Ablation (RFA) #2  Laterality: Right (-RT)  Level: L2, L3, L4, L5, and S1 Medial Branch Level(s). These levels will denervate the L3-4, L4-5, and L5-S1 lumbar facet joints.  Imaging: Fluoroscopy-guided         Anesthesia: Local anesthesia (1-2% Lidocaine) Anxiolysis: None Sedation: No Sedation                       DOS: 03/29/2022  Performed by: Oswaldo Done, MD  Purpose: Therapeutic/Palliative Indications: Low back pain severe enough to impact quality of life or function. Indications: 1. Lumbar facet syndrome (Bilateral) (L>R)   2. Spondylosis without myelopathy or radiculopathy, lumbosacral region   3. Retrolisthesis (4-5 mm) of L2/L3 and L3/L4   4. Lumbar facet hypertrophy (Bilateral)   5. Lumbar facet osteoarthritis (Bilateral)   6. Chronic low back pain (1ry area of Pain) (Bilateral) (L>R) w/o sciatica   7. Failed back surgical syndrome (Fusion L4-S1)    Lance Roman has been dealing with the above chronic pain for longer than three months and has either failed to respond, was unable to tolerate, or simply did not get enough benefit from other more  conservative therapies including, but not limited to: 1. Over-the-counter medications 2. Anti-inflammatory medications 3. Muscle relaxants 4. Membrane stabilizers 5. Opioids 6. Physical therapy and/or chiropractic manipulation 7. Modalities (Heat, ice, etc.) 8. Invasive techniques such as nerve blocks. Lance Roman has attained more than 50% relief of the pain from a series of diagnostic injections conducted in separate occasions.  Pain Score: Pre-procedure: 5 /10 Post-procedure: (P) 0-No pain/10     Post-procedure evaluation   Type: Lumbar Facet, Medial Branch Radiofrequency Ablation (RFA) #2  Laterality: Right (-RT)  Level: L2, L3, L4, L5, & S1 Medial Branch Level(s). These levels will denervate the L3-4, L4-5 and L5-S1 lumbar facet joints.  Imaging: Fluoroscopy-guided         Anesthesia: Local anesthesia (1-2% Lidocaine) Anxiolysis: IV Versed         Sedation:                         DOS: 03/17/2022  Performed by: Oswaldo Done, MD Purpose: Therapeutic/Palliative Indications: Low back pain severe enough to impact quality of life or function.     Effectiveness:  Initial hour after procedure: 100 %. Subsequent 4-6 hours post-procedure: 100 %. Analgesia past initial 6 hours: 75 % (current). Ongoing improvement:  Analgesic: The patient indicates having attained an ongoing 75% improvement on his right sided low back pain and lower extremity symptoms. Function: Lance Roman reports improvement in function ROM: Lance Roman reports improvement in ROM  Position / Prep / Materials:  Position: Prone  Prep solution: DuraPrep (Iodine Povacrylex [0.7% available iodine] and Isopropyl Alcohol, 74% w/w) Prep Area: Entire Lumbosacral Region (  Lower back from mid-thoracic region to end of tailbone and from flank to flank.) Materials:  Tray: RFA (Radiofrequency) tray Needle(s):  Type: RFA (Teflon-coated radiofrequency ablation needles) Gauge (G): 20  Length: Long (15cm) Qty:  5  Pre-op H&P Assessment:  Lance Roman is a 62 y.o. (year old), male patient, seen today for interventional treatment. He  has a past surgical history that includes Appendectomy; repairs of bilateral ankle fractures; Spinal fusion (N/A); and Spinal cord stimulator implant (N/A). Lance Roman has a current medication list which includes the following prescription(s): baclofen, gabapentin, hydrocodone-acetaminophen, [START ON 04/05/2022] hydrocodone-acetaminophen, ibuprofen, latanoprost, naloxone, oxycodone, oxycodone, [START ON 04/02/2022] oxycodone, [START ON 05/02/2022] oxycodone, oxycodone, [START ON 04/02/2022] oxycodone, [START ON 05/02/2022] oxycodone, oxycontin, and esomeprazole. His primarily concern today is the Back Pain  Initial Vital Signs:  Pulse/HCG Rate: 76  Temp: (!) 97.3 F (36.3 C) Resp: 16 BP: 136/74 SpO2: 100 %  BMI: Estimated body mass index is 33.72 kg/m as calculated from the following:   Height as of this encounter: 5\' 10"  (1.778 m).   Weight as of this encounter: 235 lb (106.6 kg).  Risk Assessment: Allergies: Reviewed. He has No Known Allergies.  Allergy Precautions: None required Coagulopathies: Reviewed. None identified.  Blood-thinner therapy: None at this time Active Infection(s): Reviewed. None identified. Lance Roman is afebrile  Site Confirmation: Lance Roman was asked to confirm the procedure and laterality before marking the site Procedure checklist: Completed Consent: Before the procedure and under the influence of no sedative(s), amnesic(s), or anxiolytics, the patient was informed of the treatment options, risks and possible complications. To fulfill our ethical and legal obligations, as recommended by the American Medical Association's Code of Ethics, I have informed the patient of my clinical impression; the nature and purpose of the treatment or procedure; the risks, benefits, and possible complications of the intervention; the alternatives, including doing  nothing; the risk(s) and benefit(s) of the alternative treatment(s) or procedure(s); and the risk(s) and benefit(s) of doing nothing. The patient was provided information about the general risks and possible complications associated with the procedure. These may include, but are not limited to: failure to achieve desired goals, infection, bleeding, organ or nerve damage, allergic reactions, paralysis, and death. In addition, the patient was informed of those risks and complications associated to Spine-related procedures, such as failure to decrease pain; infection (i.e.: Meningitis, epidural or intraspinal abscess); bleeding (i.e.: epidural hematoma, subarachnoid hemorrhage, or any other type of intraspinal or peri-dural bleeding); organ or nerve damage (i.e.: Any type of peripheral nerve, nerve root, or spinal cord injury) with subsequent damage to sensory, motor, and/or autonomic systems, resulting in permanent pain, numbness, and/or weakness of one or several areas of the body; allergic reactions; (i.e.: anaphylactic reaction); and/or death. Furthermore, the patient was informed of those risks and complications associated with the medications. These include, but are not limited to: allergic reactions (i.e.: anaphylactic or anaphylactoid reaction(s)); adrenal axis suppression; blood sugar elevation that in diabetics may result in ketoacidosis or comma; water retention that in patients with history of congestive heart failure may result in shortness of breath, pulmonary edema, and decompensation with resultant heart failure; weight gain; swelling or edema; medication-induced neural toxicity; particulate matter embolism and blood vessel occlusion with resultant organ, and/or nervous system infarction; and/or aseptic necrosis of one or more joints. Finally, the patient was informed that Medicine is not an exact science; therefore, there is also the possibility of unforeseen or unpredictable risks and/or possible  complications that may result in  a catastrophic outcome. The patient indicated having understood very clearly. We have given the patient no guarantees and we have made no promises. Enough time was given to the patient to ask questions, all of which were answered to the patient's satisfaction. Lance Roman has indicated that he wanted to continue with the procedure. Attestation: I, the ordering provider, attest that I have discussed with the patient the benefits, risks, side-effects, alternatives, likelihood of achieving goals, and potential problems during recovery for the procedure that I have provided informed consent. Date  Time: 03/29/2022  8:40 AM  Pre-Procedure Preparation:  Monitoring: As per clinic protocol. Respiration, ETCO2, SpO2, BP, heart rate and rhythm monitor placed and checked for adequate function Safety Precautions: Patient was assessed for positional comfort and pressure points before starting the procedure. Time-out: I initiated and conducted the "Time-out" before starting the procedure, as per protocol. The patient was asked to participate by confirming the accuracy of the "Time Out" information. Verification of the correct person, site, and procedure were performed and confirmed by me, the nursing staff, and the patient. "Time-out" conducted as per Joint Commission's Universal Protocol (UP.01.01.01). Time: 1004  Description of Procedure:          Laterality: Right Levels:  L2, L3, L4, L5, and S1 Medial Branch Level(s). Safety Precautions: Aspiration looking for blood return was conducted prior to all injections. At no point did we inject any substances, as a needle was being advanced. Before injecting, the patient was told to immediately notify me if he was experiencing any new onset of "ringing in the ears, or metallic taste in the mouth". No attempts were made at seeking any paresthesias. Safe injection practices and needle disposal techniques used. Medications properly checked for  expiration dates. SDV (single dose vial) medications used. After the completion of the procedure, all disposable equipment used was discarded in the proper designated medical waste containers. Local Anesthesia: Protocol guidelines were followed. The patient was positioned over the fluoroscopy table. The area was prepped in the usual manner. The time-out was completed. The target area was identified using fluoroscopy. A 12-in long, straight, sterile hemostat was used with fluoroscopic guidance to locate the targets for each level blocked. Once located, the skin was marked with an approved surgical skin marker. Once all sites were marked, the skin (epidermis, dermis, and hypodermis), as well as deeper tissues (fat, connective tissue and muscle) were infiltrated with a small amount of a short-acting local anesthetic, loaded on a 10cc syringe with a 25G, 1.5-in  Needle. An appropriate amount of time was allowed for local anesthetics to take effect before proceeding to the next step. Technical description of process:  Radiofrequency Ablation (RFA) L2 Medial Branch Nerve RFA: The target area for the L2 medial branch is at the junction of the postero-lateral aspect of the superior articular process and the superior, posterior, and medial edge of the transverse process of L3. Under fluoroscopic guidance, a Radiofrequency needle was inserted until contact was made with os over the superior postero-lateral aspect of the pedicular shadow (target area). Sensory and motor testing was conducted to properly adjust the position of the needle. Once satisfactory placement of the needle was achieved, the numbing solution was slowly injected after negative aspiration for blood. 2.0 mL of the nerve block solution was injected without difficulty or complication. After waiting for at least 3 minutes, the ablation was performed. Once completed, the needle was removed intact. L3 Medial Branch Nerve RFA: The target area for the L3  medial  branch is at the junction of the postero-lateral aspect of the superior articular process and the superior, posterior, and medial edge of the transverse process of L4. Under fluoroscopic guidance, a Radiofrequency needle was inserted until contact was made with os over the superior postero-lateral aspect of the pedicular shadow (target area). Sensory and motor testing was conducted to properly adjust the position of the needle. Once satisfactory placement of the needle was achieved, the numbing solution was slowly injected after negative aspiration for blood. 2.0 mL of the nerve block solution was injected without difficulty or complication. After waiting for at least 3 minutes, the ablation was performed. Once completed, the needle was removed intact. L4 Medial Branch Nerve RFA: The target area for the L4 medial branch is at the junction of the postero-lateral aspect of the superior articular process and the superior, posterior, and medial edge of the transverse process of L5. Under fluoroscopic guidance, a Radiofrequency needle was inserted until contact was made with os over the superior postero-lateral aspect of the pedicular shadow (target area). Sensory and motor testing was conducted to properly adjust the position of the needle. Once satisfactory placement of the needle was achieved, the numbing solution was slowly injected after negative aspiration for blood. 2.0 mL of the nerve block solution was injected without difficulty or complication. After waiting for at least 3 minutes, the ablation was performed. Once completed, the needle was removed intact. L5 Medial Branch Nerve RFA: The target area for the L5 medial branch is at the junction of the postero-lateral aspect of the superior articular process of S1 and the superior, posterior, and medial edge of the sacral ala. Under fluoroscopic guidance, a Radiofrequency needle was inserted until contact was made with os over the superior postero-lateral  aspect of the pedicular shadow (target area). Sensory and motor testing was conducted to properly adjust the position of the needle. Once satisfactory placement of the needle was achieved, the numbing solution was slowly injected after negative aspiration for blood. 2.0 mL of the nerve block solution was injected without difficulty or complication. After waiting for at least 3 minutes, the ablation was performed. Once completed, the needle was removed intact. S1 Medial Branch Nerve RFA: The target area for the S1 medial branch is located inferior to the junction of the S1 superior articular process and the L5 inferior articular process, posterior, inferior, and lateral to the 6 o'clock position of the L5-S1 facet joint, just superior to the S1 posterior foramen. Under fluoroscopic guidance, the Radiofrequency needle was advanced until contact was made with os over the Target area. Sensory and motor testing was conducted to properly adjust the position of the needle. Once satisfactory placement of the needle was achieved, the numbing solution was slowly injected after negative aspiration for blood. 2.0 mL of the nerve block solution was injected without difficulty or complication. After waiting for at least 3 minutes, the ablation was performed. Once completed, the needle was removed intact. Radiofrequency lesioning (ablation):  Radiofrequency Generator: Medtronic AccurianTM AG 1000 RF Generator Sensory Stimulation Parameters: 50 Hz was used to locate & identify the nerve, making sure that the needle was positioned such that there was no sensory stimulation below 0.3 V or above 0.7 V. Motor Stimulation Parameters: 2 Hz was used to evaluate the motor component. Care was taken not to lesion any nerves that demonstrated motor stimulation of the lower extremities at an output of less than 2.5 times that of the sensory threshold, or a maximum of 2.0 V.  Lesioning Technique Parameters: Standard Radiofrequency  settings. (Not bipolar or pulsed.) Temperature Settings: 80 degrees C Lesioning time: 60 seconds Intra-operative Compliance: Compliant  Once the entire procedure was completed, the treated area was cleaned, making sure to leave some of the prepping solution back to take advantage of its long term bactericidal properties.    Illustration of the posterior view of the lumbar spine and the posterior neural structures. Laminae of L2 through S1 are labeled. DPRL5, dorsal primary ramus of L5; DPRS1, dorsal primary ramus of S1; DPR3, dorsal primary ramus of L3; FJ, facet (zygapophyseal) joint L3-L4; I, inferior articular process of L4; LB1, lateral branch of dorsal primary ramus of L1; IAB, inferior articular branches from L3 medial branch (supplies L4-L5 facet joint); IBP, intermediate branch plexus; MB3, medial branch of dorsal primary ramus of L3; NR3, third lumbar nerve root; S, superior articular process of L5; SAB, superior articular branches from L4 (supplies L4-5 facet joint also); TP3, transverse process of L3.  Vitals:   03/29/22 1020 03/29/22 1030 03/29/22 1039 03/29/22 1045  BP: (!) 141/87 (!) 144/86 (!) 165/97 (!) (P) 145/85  Pulse: 63 62 63 (P) 62  Resp: 17 19 16  (P) 17  Temp:      SpO2: 96% 96% 97% (P) 97%  Weight:      Height:        Start Time: 1004 hrs. End Time: 1040 hrs.  Imaging Guidance (Spinal):          Type of Imaging Technique: Fluoroscopy Guidance (Spinal) Indication(s): Assistance in needle guidance and placement for procedures requiring needle placement in or near specific anatomical locations not easily accessible without such assistance. Exposure Time: Please see nurses notes. Contrast: None used. Fluoroscopic Guidance: I was personally present during the use of fluoroscopy. "Tunnel Vision Technique" used to obtain the best possible view of the target area. Parallax error corrected before commencing the procedure. "Direction-depth-direction" technique used to  introduce the needle under continuous pulsed fluoroscopy. Once target was reached, antero-posterior, oblique, and lateral fluoroscopic projection used confirm needle placement in all planes. Images permanently stored in EMR. Interpretation: No contrast injected. I personally interpreted the imaging intraoperatively. Adequate needle placement confirmed in multiple planes. Permanent images saved into the patient's record.  Antibiotic Prophylaxis:   Anti-infectives (From admission, onward)    None      Indication(s): None identified  Post-operative Assessment:  Post-procedure Vital Signs:  Pulse/HCG Rate: (P) 62  Temp: (!) 97.3 F (36.3 C) Resp: (P) 17 BP: (!) (P) 145/85 SpO2: (P) 97 %  EBL: None  Complications: No immediate post-treatment complications observed by team, or reported by patient.  Note: The patient tolerated the entire procedure well. A repeat set of vitals were taken after the procedure and the patient was kept under observation following institutional policy, for this type of procedure. Post-procedural neurological assessment was performed, showing return to baseline, prior to discharge. The patient was provided with post-procedure discharge instructions, including a section on how to identify potential problems. Should any problems arise concerning this procedure, the patient was given instructions to immediately contact us, at any time, without hesitation. In any case, we plan to contact the patient by telephone for a follow-up status report regarding this interventional procedure.  Comments:  No additional relevant information.  Plan of Care  Orders:  Orders Placed This Encounter  Procedures   LUMBAR FACET(MEDIAL BRANCH NERVE BLOCK) MBNB    Scheduling Instructions:     Procedure: Lumbar facet block (AKA.: Lumbosacral medial branch  nerve block)     Side: Right-sided     Level: L3-4, L4-5, and L5-S1 Facets (L2, L3, L4, L5, and S1 Medial Branch Nerves)      Sedation: Patient's choice.     Timeframe: Today    Order Specific Question:   Where will this procedure be performed?    Answer:   [facility]   DG PA[facility] C-ARM 1-60 MIN NO REPORT    Intraoperative interpretation by procedural physician at [place].    Standing Status:   Standing    Number of Occurrences:   1    Order Specific Question:   Reason for exam:    Answer:   Assistance in needle guidance and placement for procedures requiring needle placement in or near specific anatomical locations not easily accessible without such assistance.   Informed Consent Details: Physician/Practitioner Attestation; Transcribe to consent form and obtain patient signature    Nursing Order: Transcribe to consent form and obtain patient signature. Note: Always confirm laterality of pain with Lance Roman, before procedure.    Order Specific Question:   Physician/Practitioner attestation of informed consent for procedure/surgical case    Answer:   I, the physician/practitioner, attest that I have discussed with the patient the benefits, risks, side effects, alternatives, likelihood of achieving goals and potential problems during recovery for the procedure that I have provided informed consent.    Order Specific Question:   Procedure    Answer:   Lumbar Facet Block  under fluoroscopic guidance    Order Specific Question:   Physician/Practitioner performing the procedure    Answer:   Makenzee Choudhry A. Dossie Arbour MD    Order Specific Question:   Indication/Reason    Answer:   Low Back Pain, with our without leg pain, due to Facet Joint Arthralgia (Joint Pain) Spondylosis (Arthritis of the Spine), without myelopathy or radiculopathy (Nerve Damage).   Provide equipment / supplies at bedside    Procedure tray: "Radiofrequency Tray" Additional material: Large hemostat (x1); Small hemostat (x1); Towels (x8); 4x4 sterile sponge pack (x1) Needle type: Teflon-coated Radiofrequency Needle (Disposable   single use) Size: Long Quantity: 5    Standing Status:   Standing    Number of Occurrences:   1    Order Specific Question:   Specify    Answer:   Radiofrequency Tray   Chronic Opioid Analgesic:  Oxycodone IR 5 mg, 1 tab PO q 6 hrs (20 mg/day of oxycodone) + OxyContin 10 mg, 1 tab p.o. twice daily (20 mg/day of oxycodone) (total of 40 mg/day of oxycodone) MME/day: 60 mg/day.   Medications ordered for procedure: Meds ordered this encounter  Medications   lidocaine (XYLOCAINE) 2 % (with pres) injection 400 mg   pentafluoroprop-tetrafluoroeth (GEBAUERS) aerosol   triamcinolone acetonide (KENALOG-40) injection 40 mg   ropivacaine (PF) 2 mg/mL (0.2%) (NAROPIN) injection 9 mL   HYDROcodone-acetaminophen (NORCO/VICODIN) 5-325 MG tablet    Sig: Take 1 tablet by mouth every 8 (eight) hours as needed for up to 7 days for severe pain. Must last 7 days.    Dispense:  21 tablet    Refill:  0    For acute post-operative pain. Not to be refilled. Must last 7 days.   HYDROcodone-acetaminophen (NORCO/VICODIN) 5-325 MG tablet    Sig: Take 1 tablet by mouth every 8 (eight) hours as needed for up to 7 days for severe pain. Must last for 7 days.    Dispense:  21 tablet    Refill:  0    For acute post-operative pain. Not to be refilled. Must last 7 days.   Medications administered: We administered lidocaine, pentafluoroprop-tetrafluoroeth, triamcinolone acetonide, and ropivacaine (PF) 2 mg/mL (0.2%).  See the medical record for exact dosing, route, and time of administration.  Follow-up plan:   Return in about 6 weeks (around 05/10/2022) for Proc-day (T,Th), (VV), (PPE).       Interventional Therapies  Risk  Complexity Considerations:     NOTE: Poor candidate for RFA 2ry to Hardware   Planned  Pending:      Under consideration:   Diagnostic/therapeutic left L2-3 LESI #1  Diagnostic bilateral thoracolumbar facet MBB (L1-2 through L3-4) #1  Diagnostic/therapeutic bilateral L1-2 and L2-3  transforaminal ESI #1  Possible Racz procedure #1    Completed:   Diagnostic/therapeutic bilateral lumbar facet MBB x3 (04/27/2021) (100/100/50/70)  Palliative right lumbar facet RFA x2 (03/29/2022) (100/ Palliative left lumbar facet RFA x2 (03/03/2022) (100/100/75/75) w/ improved function and ROM Therapeutic left caudal ESI x1 (05/14/2019) (100/100/50/100) (100% relief of LEP as of 05/26/2021) (743 days) Therapeutic bilateral erector spinae & quadratus lumborum muscle MNB/TPI x2 (04/10/2018) (100/100/0/0)   Therapeutic  Palliative (PRN) options:   Therapeutic/palliative bilateral lumbar facet MBB    Pharmacotherapy:  Nonopioids transferred 02/05/2020: Baclofen Recommendations:   None at this time.        Recent Visits Date Type Provider Dept  03/03/22 Procedure visit Delano Metz, MD [facility]  02/21/22 Office Visit Delano Metz, MD [facility]  Showing recent visits within past 90 days and meeting all other requirements Today's Visits Date Type Provider Dept  03/29/22 Procedure visit Delano Metz, MD [facility]  Showing today's visits and meeting all other requirements Future Appointments Date Type Provider Dept  05/10/22 Appointment Delano Metz, MD [facility]  05/23/22 Appointment Delano Metz, MD [facility]  Showing future appointments within next 90 days and meeting all other requirements  Disposition: Discharge home  Discharge (Date  Time): 03/29/2022;   hrs.   Primary Care Physician: Myrlene Broker, MD Location: [facility] Note by: Oswaldo Done, MD Date: 03/29/2022; Time: 12:01 PM  Disclaimer:  Medicine is not an Visual merchandiser. The only guarantee in medicine is that nothing is guaranteed. It is important to note that the decision to proceed with this intervention was based on the information collected from the patient. The Data and conclusions  were drawn from the patient's questionnaire, the interview, and the physical examination. Because the information was provided in large part by the patient, it cannot be guaranteed that it has not been purposely or unconsciously manipulated. Every effort has been made to obtain as much relevant data as possible for this evaluation. It is important to note that the conclusions that lead to this procedure are derived in large part from the available data. Always take into account that the treatment will also be dependent on availability of resources and existing treatment guidelines, considered by other Pain Management Practitioners as being common knowledge and practice, at the time of the intervention. For Medico-Legal purposes, it is also important to point out that variation in procedural techniques and pharmacological choices are the acceptable norm. The indications, contraindications, technique, and results of the above procedure should only be interpreted and judged by a Board-Certified Interventional Pain Specialist with extensive familiarity and expertise in the same exact procedure and technique.

## 2022-03-29 ENCOUNTER — Ambulatory Visit: Payer: No Typology Code available for payment source | Attending: Pain Medicine | Admitting: Pain Medicine

## 2022-03-29 ENCOUNTER — Ambulatory Visit
Admission: RE | Admit: 2022-03-29 | Discharge: 2022-03-29 | Disposition: A | Discharge status: Home / Self Care | Payer: BC Managed Care – PPO | Source: Ambulatory Visit | Attending: Pain Medicine | Admitting: Pain Medicine

## 2022-03-29 ENCOUNTER — Encounter: Payer: Self-pay | Admitting: Pain Medicine

## 2022-03-29 VITALS — BP 165/97 | HR 63 | Temp 97.3°F | Resp 16 | Ht 70.0 in | Wt 235.0 lb

## 2022-03-29 DIAGNOSIS — G8918 Other acute postprocedural pain: Secondary | ICD-10-CM | POA: Diagnosis not present

## 2022-03-29 DIAGNOSIS — M47816 Spondylosis without myelopathy or radiculopathy, lumbar region: Secondary | ICD-10-CM | POA: Diagnosis not present

## 2022-03-29 DIAGNOSIS — M961 Postlaminectomy syndrome, not elsewhere classified: Secondary | ICD-10-CM | POA: Insufficient documentation

## 2022-03-29 DIAGNOSIS — M545 Low back pain, unspecified: Secondary | ICD-10-CM | POA: Insufficient documentation

## 2022-03-29 DIAGNOSIS — M47817 Spondylosis without myelopathy or radiculopathy, lumbosacral region: Secondary | ICD-10-CM | POA: Diagnosis not present

## 2022-03-29 DIAGNOSIS — M4310 Spondylolisthesis, site unspecified: Secondary | ICD-10-CM | POA: Insufficient documentation

## 2022-03-29 DIAGNOSIS — G8929 Other chronic pain: Secondary | ICD-10-CM | POA: Insufficient documentation

## 2022-03-29 MED ORDER — HYDROCODONE-ACETAMINOPHEN 5-325 MG PO TABS: 1.0000 | ORAL_TABLET | Freq: Three times a day (TID) | ORAL | 0 refills | Status: AC | PRN

## 2022-03-29 MED ORDER — LIDOCAINE HCL 2 % IJ SOLN
INTRAMUSCULAR | Status: AC
  Filled 2022-03-29: qty 20

## 2022-03-29 MED ORDER — ROPIVACAINE HCL 2 MG/ML IJ SOLN
INTRAMUSCULAR | Status: AC
  Filled 2022-03-29: qty 20

## 2022-03-29 MED ORDER — ROPIVACAINE HCL 2 MG/ML IJ SOLN
9.0000 mL | Freq: Once | INTRAMUSCULAR | Status: AC
  Administered 2022-03-29: 9 mL via PERINEURAL

## 2022-03-29 MED ORDER — TRIAMCINOLONE ACETONIDE 40 MG/ML IJ SUSP
INTRAMUSCULAR | Status: AC
  Filled 2022-03-29: qty 1

## 2022-03-29 MED ORDER — TRIAMCINOLONE ACETONIDE 40 MG/ML IJ SUSP
40.0000 mg | Freq: Once | INTRAMUSCULAR | Status: AC
  Administered 2022-03-29: 40 mg

## 2022-03-29 MED ORDER — PENTAFLUOROPROP-TETRAFLUOROETH EX AERO
INHALATION_SPRAY | Freq: Once | CUTANEOUS | Status: AC
  Administered 2022-03-29: 30 via TOPICAL
  Filled 2022-03-29: qty 116

## 2022-03-29 MED ORDER — LIDOCAINE HCL 2 % IJ SOLN
20.0000 mL | Freq: Once | INTRAMUSCULAR | Status: AC
  Administered 2022-03-29: 400 mg

## 2022-03-29 NOTE — Progress Notes (Signed)
Safety precautions to be maintained throughout the outpatient stay will include: orient to surroundings, keep bed in low position, maintain call bell within reach at all times, provide assistance with transfer out of bed and ambulation.  

## 2022-03-29 NOTE — Patient Instructions (Signed)

## 2022-03-30 ENCOUNTER — Telehealth: Payer: Self-pay

## 2022-03-30 NOTE — Telephone Encounter (Signed)
Post procedure follow up.  Patient states he is doing great.  

## 2022-04-13 DIAGNOSIS — H401131 Primary open-angle glaucoma, bilateral, mild stage: Secondary | ICD-10-CM | POA: Diagnosis not present

## 2022-04-29 ENCOUNTER — Ambulatory Visit (INDEPENDENT_AMBULATORY_CARE_PROVIDER_SITE_OTHER): Payer: BC Managed Care – PPO | Admitting: Internal Medicine

## 2022-04-29 ENCOUNTER — Encounter: Payer: Self-pay | Admitting: Internal Medicine

## 2022-04-29 VITALS — BP 140/100 | HR 64 | Temp 98.2°F | Ht 70.0 in | Wt 232.0 lb

## 2022-04-29 DIAGNOSIS — R030 Elevated blood-pressure reading, without diagnosis of hypertension: Secondary | ICD-10-CM

## 2022-04-29 DIAGNOSIS — R252 Cramp and spasm: Secondary | ICD-10-CM

## 2022-04-29 DIAGNOSIS — F1120 Opioid dependence, uncomplicated: Secondary | ICD-10-CM | POA: Diagnosis not present

## 2022-04-29 DIAGNOSIS — M7918 Myalgia, other site: Secondary | ICD-10-CM | POA: Diagnosis not present

## 2022-04-29 DIAGNOSIS — M792 Neuralgia and neuritis, unspecified: Secondary | ICD-10-CM | POA: Diagnosis not present

## 2022-04-29 DIAGNOSIS — Z0001 Encounter for general adult medical examination with abnormal findings: Secondary | ICD-10-CM | POA: Diagnosis not present

## 2022-04-29 DIAGNOSIS — K219 Gastro-esophageal reflux disease without esophagitis: Secondary | ICD-10-CM

## 2022-04-29 DIAGNOSIS — G8929 Other chronic pain: Secondary | ICD-10-CM

## 2022-04-29 LAB — CBC
HCT: 39.8 % (ref 39.0–52.0)
Hemoglobin: 13.6 g/dL (ref 13.0–17.0)
MCHC: 34.2 g/dL (ref 30.0–36.0)
MCV: 89.3 fl (ref 78.0–100.0)
Platelets: 225 10*3/uL (ref 150.0–400.0)
RBC: 4.46 Mil/uL (ref 4.22–5.81)
RDW: 13.6 % (ref 11.5–15.5)
WBC: 5.3 10*3/uL (ref 4.0–10.5)

## 2022-04-29 LAB — COMPREHENSIVE METABOLIC PANEL
ALT: 19 U/L (ref 0–53)
AST: 19 U/L (ref 0–37)
Albumin: 4.3 g/dL (ref 3.5–5.2)
Alkaline Phosphatase: 90 U/L (ref 39–117)
BUN: 24 mg/dL — ABNORMAL HIGH (ref 6–23)
CO2: 27 mEq/L (ref 19–32)
Calcium: 9.3 mg/dL (ref 8.4–10.5)
Chloride: 104 mEq/L (ref 96–112)
Creatinine, Ser: 0.77 mg/dL (ref 0.40–1.50)
GFR: 97.35 mL/min (ref >60.00)
Glucose, Bld: 109 mg/dL — ABNORMAL HIGH (ref 70–99)
Potassium: 3.8 mEq/L (ref 3.5–5.1)
Sodium: 140 mEq/L (ref 135–145)
Total Bilirubin: 0.3 mg/dL (ref 0.2–1.2)
Total Protein: 6.9 g/dL (ref 6.0–8.3)

## 2022-04-29 LAB — LIPID PANEL
Cholesterol: 162 mg/dL (ref 0–200)
HDL: 47.4 mg/dL (ref >39.00)
LDL Cholesterol: 100 mg/dL — ABNORMAL HIGH (ref 0–99)
NonHDL: 114.96
Total CHOL/HDL Ratio: 3
Triglycerides: 77 mg/dL (ref 0.0–149.0)
VLDL: 15.4 mg/dL (ref 0.0–40.0)

## 2022-04-29 LAB — HEMOGLOBIN A1C: Hgb A1c MFr Bld: 5.8 % (ref 4.6–6.5)

## 2022-04-29 MED ORDER — GABAPENTIN 600 MG PO TABS: 1200.0000 mg | ORAL_TABLET | Freq: Every day | ORAL | 3 refills | Status: DC

## 2022-04-29 MED ORDER — BACLOFEN 10 MG PO TABS: ORAL_TABLET | ORAL | 11 refills | Status: DC

## 2022-04-29 NOTE — Patient Instructions (Signed)
The goal for sodium is around 3500 mg per day.

## 2022-04-29 NOTE — Progress Notes (Signed)
   Subjective:   Patient ID: Lance Roman, male    DOB: [birth year], 61 y.o.   MRN: [id number]  HPI The patient is here for physical. With elevated BP readings consistently over the last 6-12 months.  PMH, [facility], social history reviewed and updated  Review of Systems  Constitutional: Negative.   HENT: Negative.    Eyes: Negative.   Respiratory:  Negative for cough, chest tightness and shortness of breath.   Cardiovascular:  Negative for chest pain, palpitations and leg swelling.  Gastrointestinal:  Negative for abdominal distention, abdominal pain, constipation, diarrhea, nausea and vomiting.  Musculoskeletal: Negative.   Skin: Negative.   Neurological: Negative.   Psychiatric/Behavioral: Negative.      Objective:  Physical Exam Constitutional:      Appearance: He is well-developed.  HENT:     Head: Normocephalic and atraumatic.  Cardiovascular:     Rate and Rhythm: Normal rate and regular rhythm.  Pulmonary:     Effort: Pulmonary effort is normal. No respiratory distress.     Breath sounds: Normal breath sounds. No wheezing or rales.  Abdominal:     General: Bowel sounds are normal. There is no distension.     Palpations: Abdomen is soft.     Tenderness: There is no abdominal tenderness. There is no rebound.  Musculoskeletal:     Cervical back: Normal range of motion.  Skin:    General: Skin is warm and dry.  Neurological:     Mental Status: He is alert and oriented to person, place, and time.     Coordination: Coordination normal.     Vitals:   04/29/22 0819 04/29/22 0822  BP: (!) 160/80 (!) 160/80  Pulse: 64   Temp: 98.2 F (36.8 C)   TempSrc: Oral   SpO2: 97%   Weight: 232 lb (105.2 kg)   Height: 5\' 10"  (1.778 m)    EKG: Rate 58, axis left, interval normal, sinus, no st or t wave changes borderline LVH, LVH new since prior EKG, left axis is not new since EKG 2012   Assessment & Plan:

## 2022-04-29 NOTE — Assessment & Plan Note (Addendum)
Seeing pain management and taking oxycontin 10 mg BID and oxycodone 5/325 #120 per month. He will continue to see them for management of his chronic skeletal issues. Has narcan if needed.

## 2022-04-29 NOTE — Assessment & Plan Note (Signed)
Reefill baclofen he takes 10 mg TID and 20 mg qhs. No current side effects. Also taking gabapentin 1200 mg qhs which is refilled. Some weight gain may be associated but helping with pain will continue.

## 2022-04-29 NOTE — Assessment & Plan Note (Signed)
Concerning with moderate elevation in reading today. In review of chart most readings with pain doctor are mildly elevated. EKG done today and new LVH compared to 2012. I have recommended either lifestyle changes or low dose BP medication and follow up 2 months. He elects lifestyle modification and we discussed DASH diet and given information about same.

## 2022-04-29 NOTE — Assessment & Plan Note (Signed)
Flu shot declines. Covid-19 declines. Shingrix declines. Tetanus up to date. Cologuard due he declines. Counseled about sun safety and mole surveillance. Counseled about the dangers of distracted driving. Given 10 year screening recommendations.

## 2022-04-29 NOTE — Assessment & Plan Note (Signed)
Taking nexium 40 mg daily and continue as effective and benefit outweighs harm.

## 2022-04-29 NOTE — Assessment & Plan Note (Signed)
Taking gabapentin 1200 mg qhs and refilled. Some possible weight gain which could be side effect but he desires to continue as this is helping.

## 2022-05-06 NOTE — Progress Notes (Unsigned)
Patient: Lance Roman  Service Category: E/M  Provider: Gaspar Cola, MD  DOB: [birth year]  DOS: 05/10/2022  Location: Office  MRN: [id number]  Setting: Ambulatory outpatient  Referring Provider: Hoyt Koch, *  Type: Established Patient  Specialty: Interventional Pain Management  PCP: Hoyt Koch, MD  Location: Remote location  Delivery: TeleHealth     Virtual Encounter - Pain Management PROVIDER NOTE: Information contained herein reflects review and annotations entered in association with encounter. Interpretation of such information and data should be left to medically-trained personnel. Information provided to patient can be located elsewhere in the medical record under "Patient Instructions". Document created using STT-dictation technology, any transcriptional errors that may result from process are unintentional.    Contact & Pharmacy Preferred: [phone] Home: [phone] (home) Mobile: [phone] (mobile) E-mail: [email or web address]  [place], [place] [address] Phone: [phone] Fax: [phone]   Pre-screening  Lance Roman offered "in-person" vs "virtual" encounter. He indicated preferring virtual for this encounter.   Reason COVID-19*  Social distancing based on CDC and AMA recommendations.   I contacted Lance Roman on 05/10/2022 via telephone.      I clearly identified myself as Gaspar Cola, MD. I verified that I was speaking with the correct person using two identifiers (Name: Lance Roman, and date of birth: Nov 06, 1960).  Consent I sought verbal advanced consent from Lance Roman for virtual visit interactions. I informed Lance Roman of possible security and privacy concerns, risks, and limitations associated with providing "not-in-person" medical evaluation and management services. I also informed Mr. Guadalupe of the availability of "in-person"  appointments. Finally, I informed him that there would be a charge for the virtual visit and that he could be  personally, fully or partially, financially responsible for it. Lance Roman expressed understanding and agreed to proceed.   Historic Elements   Lance Roman is a 62 y.o. year old, male patient evaluated today after our last contact on 03/29/2022. Lance Roman  has a past medical history of Acute postoperative pain (11/21/2017), Allergic rhinitis, Chronic pain syndrome, DDD (degenerative disc disease), lumbosacral, GERD (gastroesophageal reflux disease), Hyperlipidemia, Osteoarthritis, and S/P appendectomy. He also  has a past surgical history that includes Appendectomy; repairs of bilateral ankle fractures; Spinal fusion (N/A); and Spinal cord stimulator implant (N/A). Lance Roman has a current medication list which includes the following prescription(s): baclofen, gabapentin, ibuprofen, latanoprost, naloxone, oxycodone, oxycodone, oxycodone, oxycontin, esomeprazole, oxycodone, oxycodone, oxycodone, and oxycodone. He  reports that he has quit smoking. His smokeless tobacco use includes chew. He reports current alcohol use of about 3.0 standard drinks of alcohol per week. He reports that he does not use drugs. Lance Roman has No Known Allergies.  Estimated body mass index is 33.29 kg/m as calculated from the following:   Height as of 04/29/22: 5' 10"$  (1.778 m).   Weight as of 04/29/22: 232 lb (105.2 kg).  HPI  Today, he is being contacted for a post-procedure assessment.  The indicates having attained 100% relief of the pain for the duration of the local anesthetic.  Currently he has an ongoing 100% relief of the low back pain and lower extremity pain on the right side while on the left side he refers having an ongoing 50% improvement with some pain left around the left PSIS area.  In terms of the left lower extremity he refers having some pain left in his heel.  Today he indicates that his primary  area of pain is that of the mid thoracic spine, just below the scapula and the midline area.  He is interested in having something done with this.  This pain is very specifically located and is likely to be either myofascial in origin or from the interspinous ligament at that level.  I will bring him in for a trigger point injection and if that does not improve his pain, then we will order some x-rays of the thoracic spine.  He also refers having bilateral upper extremity pain that seems to occasionally go down to his hands.  He is not complaining today of neck pain.  Post-procedure evaluation   Type: Lumbar Facet, Medial Branch Radiofrequency Ablation (RFA) #2  Laterality: Right (-RT)  Level: L2, L3, L4, L5, and S1 Medial Branch Level(s). These levels will denervate the L3-4, L4-5, and L5-S1 lumbar facet joints.  Imaging: Fluoroscopy-guided         Anesthesia: Local anesthesia (1-2% Lidocaine) Anxiolysis: None Sedation: No Sedation                       DOS: 03/29/2022  Performed by: Gaspar Cola, MD  Purpose: Therapeutic/Palliative Indications: Low back pain severe enough to impact quality of life or function. Indications: 1. Lumbar facet syndrome (Bilateral) (L>R)   2. Spondylosis without myelopathy or radiculopathy, lumbosacral region   3. Retrolisthesis (4-5 mm) of L2/L3 and L3/L4   4. Lumbar facet hypertrophy (Bilateral)   5. Lumbar facet osteoarthritis (Bilateral)   6. Chronic low back pain (1ry area of Pain) (Bilateral) (L>R) w/o sciatica   7. Failed back surgical syndrome (Fusion L4-S1)    Pain Score: Pre-procedure: 5 /10 Post-procedure: (P) 0-No pain/10     Effectiveness:  Initial hour after procedure: 100 %. Subsequent 4-6 hours post-procedure: 100 %. Analgesia past initial 6 hours: 50 % (ongoing). Ongoing improvement:  Analgesic: The patient indicates having an ongoing 100% relief of the pain in the right lower back and right lower extremity with an ongoing 50%  improvement of his low back pain on the left side with some pain left on his left heel. Function: Lance Roman reports improvement in function ROM: Mr. Shirkey reports improvement in ROM  Pharmacotherapy Assessment   Opioid Analgesic: Oxycodone IR 5 mg, 1 tab PO q 6 hrs (20 mg/day of oxycodone) + OxyContin 10 mg, 1 tab p.o. twice daily (20 mg/day of oxycodone) (total of 40 mg/day of oxycodone) MME/day: 60 mg/day.   Monitoring:  PMP: PDMP reviewed during this encounter.       Pharmacotherapy: No side-effects or adverse reactions reported. Compliance: No problems identified. Effectiveness: Clinically acceptable. Plan: Refer to "POC". UDS:  Summary  Date Value Ref Range Status  08/30/2021 Note  Final    Comment:    ==================================================================== [employer] 13 (MW) ==================================================================== Test                             Result       Flag       Units  Drug Present and Declared for Prescription Verification   Oxycodone                      5213         EXPECTED   ng/mg creat   Oxymorphone  1250         EXPECTED   ng/mg creat   Noroxycodone                   >5587        EXPECTED   ng/mg creat   Noroxymorphone                 443          EXPECTED   ng/mg creat    Sources of oxycodone are scheduled prescription medications.    Oxymorphone, noroxycodone, and noroxymorphone are expected    metabolites of oxycodone. Oxymorphone is also available as a    scheduled prescription medication.  ==================================================================== Test                      Result    Flag   Units      Ref Range   Creatinine              179              mg/dL      >=20 ==================================================================== Declared Medications:  The flagging and interpretation on this report are based on the  following declared medications.  Unexpected results  may arise from  inaccuracies in the declared medications.   **Note: The testing scope of this panel includes these medications:   Oxycodone (OxyIR)   **Note: The testing scope of this panel does not include the  following reported medications:   Baclofen (Lioresal)  Esomeprazole (Nexium)  Gabapentin (Neurontin)  Ibuprofen (Advil)  Latanoprost (Xalatan)  Naloxone (Narcan) ==================================================================== For clinical consultation, please call [phone]. ====================================================================    No results found for: "CBDTHCR", "D8THCCBX", "D9THCCBX"   Laboratory Chemistry Profile   Renal Lab Results  Component Value Date   BUN 24 (H) 04/29/2022   CREATININE 0.77 04/29/2022   BCR 27 (H) 12/12/2016   GFR 97.35 04/29/2022   GFRAA 120 12/12/2016   GFRNONAA 104 12/12/2016    Hepatic Lab Results  Component Value Date   AST 19 04/29/2022   ALT 19 04/29/2022   ALBUMIN 4.3 04/29/2022   ALKPHOS 90 04/29/2022   HCVAB NEGATIVE 12/30/2014    Electrolytes Lab Results  Component Value Date   NA 140 04/29/2022   K 3.8 04/29/2022   CL 104 04/29/2022   CALCIUM 9.3 04/29/2022   MG 1.9 12/12/2016    Bone Lab Results  Component Value Date   25OHVITD1 44 12/12/2016   25OHVITD2 <1.0 12/12/2016   25OHVITD3 43 12/12/2016    Inflammation (CRP: Acute Phase) (ESR: Chronic Phase) Lab Results  Component Value Date   CRP 2.5 12/12/2016   ESRSEDRATE 10 12/12/2016         Note: Above Lab results reviewed.  Imaging  DG PAIN CLINIC C-ARM 1-60 MIN NO REPORT Fluoro was used, but no Radiologist interpretation will be provided.  Please refer to "NOTES" tab for provider progress note.  Assessment  The primary encounter diagnosis was Chronic low back pain (1ry area of Pain) (Bilateral) (L>R) w/o sciatica. Diagnoses of Lumbar facet syndrome (Bilateral) (L>R), Chronic lower extremity pain (2ry area of Pain) (Left),  Failed back surgical syndrome (Fusion L4-S1), Myofascial pain syndrome of thoracic spine, and Chronic midline thoracic back pain were also pertinent to this visit.  Plan of Care  Problem-specific:  No problem-specific Assessment & Plan notes found for this encounter.  Lance Roman has a current medication list which includes the  following long-term medication(s): baclofen, gabapentin, oxycodone, oxycodone, oxycodone, oxycontin, esomeprazole, oxycodone, oxycodone, oxycodone, and oxycodone.  Pharmacotherapy (Medications Ordered): No orders of the defined types were placed in this encounter.  Orders:  Orders Placed This Encounter  Procedures   Injection tendon or ligament    Standing Status:   Future    Standing Expiration Date:   08/08/2022    Scheduling Instructions:     Type of Block:  Interspinous process ligament injection over (thoracic region)     Side: Midline     Sedation: No Sedation.     Timeframe: ASAA   Follow-up plan:   Return for [facility]): (ML) Thoracic TPI/MNB #1.     Interventional Therapies  Risk  Complexity Considerations:     NOTE: Poor candidate for RFA 2ry to Hardware   Planned  Pending:   Diagnostic/therapeutic midline thoracic TPI/MNB #1    Under consideration:   Therapeutic left caudal ESI #2  Diagnostic/therapeutic left L2-3 LESI #1  Diagnostic bilateral thoracolumbar facet MBB (L1-2 through L3-4) #1  Diagnostic/therapeutic bilateral L1-2 and L2-3 transforaminal ESI #1  Possible Racz procedure #1    Completed:   Diagnostic/therapeutic bilateral lumbar facet MBB x3 (04/27/2021) (100/100/50/70)  Palliative right lumbar facet RFA x2 (03/29/2022) (100/100/50/50)  Palliative left lumbar facet RFA x2 (03/03/2022) (100/100/75/75) w/ improved function and ROM Therapeutic left caudal ESI x1 (05/14/2019) (100/100/50/100) (100% relief of LEP as of 05/26/2021) (743 days) Therapeutic bilateral erector spinae & quadratus lumborum muscle MNB/TPI x2  (04/10/2018) (100/100/0/0)   Therapeutic  Palliative (PRN) options:   Therapeutic/palliative bilateral lumbar facet MBB    Pharmacotherapy  Nonopioids transferred 02/05/2020: Baclofen       Recent Visits Date Type Provider Dept  03/29/22 Procedure visit Milinda Pointer, MD [facility]  03/03/22 Procedure visit Milinda Pointer, MD [facility]  02/21/22 Office Visit Milinda Pointer, MD [facility]  Showing recent visits within past 90 days and meeting all other requirements Today's Visits Date Type Provider Dept  05/10/22 Office Visit Milinda Pointer, MD [facility]  Showing today's visits and meeting all other requirements Future Appointments Date Type Provider Dept  05/23/22 Appointment Milinda Pointer, MD [facility]  Showing future appointments within next 90 days and meeting all other requirements  I discussed the assessment and treatment plan with the patient. The patient was provided an opportunity to ask questions and all were answered. The patient agreed with the plan and demonstrated an understanding of the instructions.  Patient advised to call back or seek an in-person evaluation if the symptoms or condition worsens.  Duration of encounter: 16 minutes.  Note by: Gaspar Cola, MD Date: 05/10/2022; Time: 12:16 PM

## 2022-05-10 ENCOUNTER — Ambulatory Visit: Payer: BC Managed Care – PPO | Attending: Pain Medicine | Admitting: Pain Medicine

## 2022-05-10 DIAGNOSIS — M545 Low back pain, unspecified: Secondary | ICD-10-CM

## 2022-05-10 DIAGNOSIS — M7918 Myalgia, other site: Secondary | ICD-10-CM

## 2022-05-10 DIAGNOSIS — M79605 Pain in left leg: Secondary | ICD-10-CM | POA: Diagnosis not present

## 2022-05-10 DIAGNOSIS — M961 Postlaminectomy syndrome, not elsewhere classified: Secondary | ICD-10-CM | POA: Diagnosis not present

## 2022-05-10 DIAGNOSIS — M47816 Spondylosis without myelopathy or radiculopathy, lumbar region: Secondary | ICD-10-CM

## 2022-05-10 DIAGNOSIS — G8929 Other chronic pain: Secondary | ICD-10-CM

## 2022-05-10 DIAGNOSIS — M546 Pain in thoracic spine: Secondary | ICD-10-CM

## 2022-05-10 NOTE — Patient Instructions (Signed)

## 2022-05-23 ENCOUNTER — Ambulatory Visit: Payer: No Typology Code available for payment source | Attending: Pain Medicine | Admitting: Pain Medicine

## 2022-05-23 ENCOUNTER — Encounter: Payer: Self-pay | Admitting: Pain Medicine

## 2022-05-23 VITALS — BP 123/65 | HR 64 | Temp 97.3°F | Resp 16 | Ht 70.0 in | Wt 235.0 lb

## 2022-05-23 DIAGNOSIS — G8929 Other chronic pain: Secondary | ICD-10-CM | POA: Diagnosis present

## 2022-05-23 DIAGNOSIS — Z79899 Other long term (current) drug therapy: Secondary | ICD-10-CM | POA: Diagnosis present

## 2022-05-23 DIAGNOSIS — G894 Chronic pain syndrome: Secondary | ICD-10-CM | POA: Diagnosis present

## 2022-05-23 DIAGNOSIS — M79605 Pain in left leg: Secondary | ICD-10-CM | POA: Insufficient documentation

## 2022-05-23 DIAGNOSIS — M961 Postlaminectomy syndrome, not elsewhere classified: Secondary | ICD-10-CM | POA: Diagnosis present

## 2022-05-23 DIAGNOSIS — Z79891 Long term (current) use of opiate analgesic: Secondary | ICD-10-CM | POA: Diagnosis present

## 2022-05-23 DIAGNOSIS — M47816 Spondylosis without myelopathy or radiculopathy, lumbar region: Secondary | ICD-10-CM | POA: Diagnosis present

## 2022-05-23 DIAGNOSIS — M545 Low back pain, unspecified: Secondary | ICD-10-CM | POA: Insufficient documentation

## 2022-05-23 DIAGNOSIS — M7918 Myalgia, other site: Secondary | ICD-10-CM | POA: Insufficient documentation

## 2022-05-23 DIAGNOSIS — M549 Dorsalgia, unspecified: Secondary | ICD-10-CM | POA: Diagnosis present

## 2022-05-23 MED ORDER — OXYCODONE HCL 5 MG PO TABS: 5.0000 mg | ORAL_TABLET | Freq: Four times a day (QID) | ORAL | 0 refills | Status: DC | PRN

## 2022-05-23 MED ORDER — OXYCODONE HCL ER 10 MG PO T12A: 10.0000 mg | EXTENDED_RELEASE_TABLET | Freq: Two times a day (BID) | ORAL | 0 refills | Status: DC

## 2022-05-23 NOTE — Progress Notes (Signed)
Nursing Pain Medication Assessment:  Safety precautions to be maintained throughout the outpatient stay will include: orient to surroundings, keep bed in low position, maintain call bell within reach at all times, provide assistance with transfer out of bed and ambulation.  Medication Inspection Compliance: Pill count conducted under aseptic conditions, in front of the patient. Neither the pills nor the bottle was removed from the patient's sight at any time. Once count was completed pills were immediately returned to the patient in their original bottle.  Medication #1: Oxycodone ER (OxyContin) Pill/Patch Count:  17 of 60 pills remain Pill/Patch Appearance: Markings consistent with prescribed medication Bottle Appearance: Standard pharmacy container. Clearly labeled. Filled Date: 02 / 05 / 2024 Last Medication intake:  Today  Medication #2: Oxycodone IR Pill/Patch Count:  38 of 120 pills remain Pill/Patch Appearance: Markings consistent with prescribed medication Bottle Appearance: Standard pharmacy container. Clearly labeled. Filled Date: 02 / 05 / 2024 Last Medication intake:  Yesterday

## 2022-05-23 NOTE — Progress Notes (Signed)
PROVIDER NOTE: Information contained herein reflects review and annotations entered in association with encounter. Interpretation of such information and data should be left to medically-trained personnel. Information provided to patient can be located elsewhere in the medical record under "Patient Instructions". Document created using STT-dictation technology, any transcriptional errors that may result from process are unintentional.    Patient: Lance Roman  Service Category: E/M  Provider: Gaspar Cola, MD  DOB: 01/11/1961  DOS: 05/23/2022  Referring Provider: Hoyt Koch, *  MRN: [id number]  Specialty: Interventional Pain Management  PCP: Hoyt Koch, MD  Type: Established Patient  Setting: Ambulatory outpatient    Location: Office  Delivery: Face-to-face     HPI  [name], a 62 y.o. year old male, is here today because of his Chronic pain syndrome [G89.4]. [name] primary complain today is Back Pain (Lumbar bilateral left is worse ) Last encounter: My last encounter with him was on 05/10/2022. Pertinent problems: [name] has Chronic low back pain (1ry area of Pain) (Bilateral) (L>R) w/o sciatica; Chronic pain syndrome; Chronic lower extremity pain (2ry area of Pain) (Left); Chronic lumbar radicular pain (L5) (Left); Retrolisthesis (4-5 mm) of L2/L3 and L3/L4; Failed back surgical syndrome (Fusion L4-S1); Lumbar facet syndrome (Bilateral) (L>R); Spinal cord stimulator dysfunction (fractured leads); Lumbar facet osteoarthritis (Bilateral); Spondylosis without myelopathy or radiculopathy, lumbosacral region; Other specified dorsopathies, sacral and sacrococcygeal region; Chronic hip pain (Left); Chronic knee pain (Left); DDD (degenerative disc disease), lumbosacral; Neurogenic pain; Chronic upper back pain; Chronic musculoskeletal pain; Epidural fibrosis; Abnormal CT/myelogram scan, lumbar spine (08/28/2017); Myofascial pain syndrome of thoracic spine; and  Chronic midline thoracic back pain on their pertinent problem list. Pain Assessment: Severity of Chronic pain is reported as a 7 /10. Location: Back Lower, Left, Right/down left leg. Onset: More than a month ago. Quality: Discomfort, Constant, Aching, Other (Comment) (left heel fells as if it is bruised.). Timing: Constant. Modifying factor(s): medications, rest, heat and ice. Vitals:  height is '5\' 10"'$  (1.778 m) and weight is 235 lb (106.6 kg). His temporal temperature is 97.3 F (36.3 C) (abnormal). His blood pressure is 123/65 and his pulse is 64. His respiration is 16 and oxygen saturation is 98%.  BMI: Estimated body mass index is 33.72 kg/m as calculated from the following:   Height as of this encounter: '5\' 10"'$  (1.778 m).   Weight as of this encounter: 235 lb (106.6 kg).  Reason for encounter: medication management.  The patient indicates doing well with the current medication regimen. No adverse reactions or side effects reported to the medications.  Worker's Compensation has arranged for him to have an evaluation by Dr. Lacinda Axon.  He is pending to have this done in the near future.  On his next follow-up, we will review the results of this evaluation.  Today he refers still doing better from the radiofrequency.  Occasionally he will have some heaviness on his heel which he has identified as going away whenever we do any kind of injection in his back, suggesting that it is radicular.  He is still having some discomfort on the left lower back, but he refers that it is a lot better than it was before the radiofrequency.  He is also having some pain in the mid back for which he apparently is going to be seeing Dr. Lacinda Axon.  RTCB: 08/30/2022   Pharmacotherapy Assessment  Analgesic: Oxycodone IR 5 mg, 1 tab PO q 6 hrs (20 mg/day of oxycodone) + OxyContin  10 mg, 1 tab p.o. twice daily (20 mg/day of oxycodone) (total of 40 mg/day of oxycodone) MME/day: 60 mg/day.   Monitoring: [place] PMP: PDMP reviewed during  this encounter.       Pharmacotherapy: No side-effects or adverse reactions reported. Compliance: No problems identified. Effectiveness: Clinically acceptable.  Janett Billow, RN  05/23/2022  8:16 AM  Sign when Signing Visit Nursing Pain Medication Assessment:  Safety precautions to be maintained throughout the outpatient stay will include: orient to surroundings, keep bed in low position, maintain call bell within reach at all times, provide assistance with transfer out of bed and ambulation.  Medication Inspection Compliance: Pill count conducted under aseptic conditions, in front of the patient. Neither the pills nor the bottle was removed from the patient's sight at any time. Once count was completed pills were immediately returned to the patient in their original bottle.  Medication #1: Oxycodone ER (OxyContin) Pill/Patch Count:  17 of 60 pills remain Pill/Patch Appearance: Markings consistent with prescribed medication Bottle Appearance: Standard pharmacy container. Clearly labeled. Filled Date: 02 / 05 / 2024 Last Medication intake:  Today  Medication #2: Oxycodone IR Pill/Patch Count:  38 of 120 pills remain Pill/Patch Appearance: Markings consistent with prescribed medication Bottle Appearance: Standard pharmacy container. Clearly labeled. Filled Date: 02 / 05 / 2024 Last Medication intake:  Yesterday    No results found for: "CBDTHCR" No results found for: "D8THCCBX" No results found for: "D9THCCBX"  UDS:  Summary  Date Value Ref Range Status  08/30/2021 Note  Final    Comment:    ==================================================================== [employer] 13 (MW) ==================================================================== Test                             Result       Flag       Units  Drug Present and Declared for Prescription Verification   Oxycodone                      5213         EXPECTED   ng/mg creat   Oxymorphone                     1250         EXPECTED   ng/mg creat   Noroxycodone                   >5587        EXPECTED   ng/mg creat   Noroxymorphone                 443          EXPECTED   ng/mg creat    Sources of oxycodone are scheduled prescription medications.    Oxymorphone, noroxycodone, and noroxymorphone are expected    metabolites of oxycodone. Oxymorphone is also available as a    scheduled prescription medication.  ==================================================================== Test                      Result    Flag   Units      Ref Range   Creatinine              179              mg/dL      >=20 ==================================================================== Declared Medications:  The flagging and interpretation on this report are based on the  following declared medications.  Unexpected results may arise from  inaccuracies in the declared medications.   **Note: The testing scope of this panel includes these medications:   Oxycodone (OxyIR)   **Note: The testing scope of this panel does not include the  following reported medications:   Baclofen (Lioresal)  Esomeprazole (Nexium)  Gabapentin (Neurontin)  Ibuprofen (Advil)  Latanoprost (Xalatan)  Naloxone (Narcan) ==================================================================== For clinical consultation, please call [phone]. ====================================================================       ROS  Constitutional: Denies any fever or chills Gastrointestinal: No reported hemesis, hematochezia, vomiting, or acute GI distress Musculoskeletal: Denies any acute onset joint swelling, redness, loss of ROM, or weakness Neurological: No reported episodes of acute onset apraxia, aphasia, dysarthria, agnosia, amnesia, paralysis, loss of coordination, or loss of consciousness  Medication Review  baclofen, esomeprazole, gabapentin, ibuprofen, latanoprost, naloxone, and oxyCODONE  History Review  Allergy: Mr. Kerin has No  Known Allergies. Drug: Mr. Juniper  reports no history of drug use. Alcohol:  reports current alcohol use of about 3.0 standard drinks of alcohol per week. Tobacco:  reports that he has quit smoking. His smokeless tobacco use includes chew. Social: Mr. Forino  reports that he has quit smoking. His smokeless tobacco use includes chew. He reports current alcohol use of about 3.0 standard drinks of alcohol per week. He reports that he does not use drugs. Medical:  has a past medical history of Acute postoperative pain (11/21/2017), Allergic rhinitis, Chronic pain syndrome, DDD (degenerative disc disease), lumbosacral, GERD (gastroesophageal reflux disease), Hyperlipidemia, Osteoarthritis, and S/P appendectomy. Surgical: Mr. Sawinski  has a past surgical history that includes Appendectomy; repairs of bilateral ankle fractures; Spinal fusion (N/A); and Spinal cord stimulator implant (N/A). Family: family history includes Diabetes in his maternal grandmother; Heart disease in his father.  Laboratory Chemistry Profile   Renal Lab Results  Component Value Date   BUN 24 (H) 04/29/2022   CREATININE 0.77 04/29/2022   BCR 27 (H) 12/12/2016   GFR 97.35 04/29/2022   GFRAA 120 12/12/2016   GFRNONAA 104 12/12/2016    Hepatic Lab Results  Component Value Date   AST 19 04/29/2022   ALT 19 04/29/2022   ALBUMIN 4.3 04/29/2022   ALKPHOS 90 04/29/2022   HCVAB NEGATIVE 12/30/2014    Electrolytes Lab Results  Component Value Date   NA 140 04/29/2022   K 3.8 04/29/2022   CL 104 04/29/2022   CALCIUM 9.3 04/29/2022   MG 1.9 12/12/2016    Bone Lab Results  Component Value Date   25OHVITD1 44 12/12/2016   25OHVITD2 <1.0 12/12/2016   25OHVITD3 43 12/12/2016    Inflammation (CRP: Acute Phase) (ESR: Chronic Phase) Lab Results  Component Value Date   CRP 2.5 12/12/2016   ESRSEDRATE 10 12/12/2016         Note: Above Lab results reviewed.  Recent Imaging Review  DG PAIN CLINIC C-ARM 1-60 MIN NO  REPORT Fluoro was used, but no Radiologist interpretation will be provided.  Please refer to "NOTES" tab for provider progress note. Note: Reviewed        Physical Exam  General appearance: Well nourished, well developed, and well hydrated. In no apparent acute distress Mental status: Alert, oriented x 3 (person, place, & time)       Respiratory: No evidence of acute respiratory distress Eyes: PERLA Vitals: BP 123/65 (BP Location: Left Arm, Patient Position: Sitting, Cuff Size: Large)   Pulse 64   Temp (!) 97.3 F (36.3 C) (Temporal)   Resp 16  Ht '5\' 10"'$  (1.778 m)   Wt 235 lb (106.6 kg)   SpO2 98%   BMI 33.72 kg/m  BMI: Estimated body mass index is 33.72 kg/m as calculated from the following:   Height as of this encounter: '5\' 10"'$  (1.778 m).   Weight as of this encounter: 235 lb (106.6 kg). Ideal: Ideal body weight: 73 kg (160 lb 15 oz) Adjusted ideal body weight: 86.4 kg (190 lb 9 oz)  Assessment   Diagnosis Status  1. Chronic pain syndrome   2. Chronic low back pain (1ry area of Pain) (Bilateral) (L>R) w/o sciatica   3. Chronic lower extremity pain (2ry area of Pain) (Left)   4. Failed back surgical syndrome (Fusion L4-S1)   5. Chronic upper back pain   6. Lumbar facet syndrome (Bilateral) (L>R)   7. Chronic musculoskeletal pain   8. Pharmacologic therapy   9. Chronic use of opiate for therapeutic purpose   10. Encounter for medication management   11. Encounter for chronic pain management    Controlled Controlled Controlled   Updated Problems: No problems updated.  Plan of Care  Problem-specific:  No problem-specific Assessment & Plan notes found for this encounter.  Mr. AEVIN BERNHEIM has a current medication list which includes the following long-term medication(s): baclofen, esomeprazole, gabapentin, [START ON 06/01/2022] oxycodone, [START ON 07/01/2022] oxycodone, [START ON 07/31/2022] oxycodone, [START ON 06/01/2022] oxycodone, [START ON 07/01/2022] oxycodone, and  [START ON 07/31/2022] oxycodone.  Pharmacotherapy (Medications Ordered): Meds ordered this encounter  Medications   oxyCODONE (OXY IR/ROXICODONE) 5 MG immediate release tablet    Sig: Take 1 tablet (5 mg total) by mouth every 6 (six) hours as needed for severe pain. Must last 30 days.    Dispense:  120 tablet    Refill:  0    DO NOT: delete (not duplicate); no partial-fill (will deny script to complete), no refill request (F/U required). DISPENSE: 1 day early if closed on fill date. WARN: No CNS-depressants within 8 hrs of med.   oxyCODONE (OXYCONTIN) 10 mg 12 hr tablet    Sig: Take 1 tablet (10 mg total) by mouth every 12 (twelve) hours. Must last 30 days.    Dispense:  60 tablet    Refill:  0    DO NOT: delete (not duplicate); no partial-fill (will deny script to complete), no refill request (F/U required). DISPENSE: 1 day early if closed on fill date. WARN: No CNS-depressants within 8 hrs of med.   oxyCODONE (OXYCONTIN) 10 mg 12 hr tablet    Sig: Take 1 tablet (10 mg total) by mouth every 12 (twelve) hours. Must last 30 days.    Dispense:  60 tablet    Refill:  0    DO NOT: delete (not duplicate); no partial-fill (will deny script to complete), no refill request (F/U required). DISPENSE: 1 day early if closed on fill date. WARN: No CNS-depressants within 8 hrs of med.   oxyCODONE (OXYCONTIN) 10 mg 12 hr tablet    Sig: Take 1 tablet (10 mg total) by mouth every 12 (twelve) hours. Must last 30 days.    Dispense:  60 tablet    Refill:  0    DO NOT: delete (not duplicate); no partial-fill (will deny script to complete), no refill request (F/U required). DISPENSE: 1 day early if closed on fill date. WARN: No CNS-depressants within 8 hrs of med.   oxyCODONE (OXY IR/ROXICODONE) 5 MG immediate release tablet    Sig: Take 1 tablet (5  mg total) by mouth every 6 (six) hours as needed for severe pain. Must last 30 days.    Dispense:  120 tablet    Refill:  0    DO NOT: delete (not duplicate); no  partial-fill (will deny script to complete), no refill request (F/U required). DISPENSE: 1 day early if closed on fill date. WARN: No CNS-depressants within 8 hrs of med.   oxyCODONE (OXY IR/ROXICODONE) 5 MG immediate release tablet    Sig: Take 1 tablet (5 mg total) by mouth every 6 (six) hours as needed for severe pain. Must last 30 days.    Dispense:  120 tablet    Refill:  0    DO NOT: delete (not duplicate); no partial-fill (will deny script to complete), no refill request (F/U required). DISPENSE: 1 day early if closed on fill date. WARN: No CNS-depressants within 8 hrs of med.   Orders:  No orders of the defined types were placed in this encounter.  Follow-up plan:   Return in about 3 months (around 08/30/2022) for Eval-day (M,W), (F2F), (MM).      Interventional Therapies  Risk  Complexity Considerations:     NOTE: Poor candidate for RFA 2ry to Hardware   Planned  Pending:   Diagnostic/therapeutic midline thoracic TPI/MNB #1    Under consideration:   Therapeutic left caudal ESI #2  Diagnostic/therapeutic left L2-3 LESI #1  Diagnostic bilateral thoracolumbar facet MBB (L1-2 through L3-4) #1  Diagnostic/therapeutic bilateral L1-2 and L2-3 transforaminal ESI #1  Possible Racz procedure #1    Completed:   Diagnostic/therapeutic bilateral lumbar facet MBB x3 (04/27/2021) (100/100/50/70)  Palliative right lumbar facet RFA x2 (03/29/2022) (100/100/50/50)  Palliative left lumbar facet RFA x2 (03/03/2022) (100/100/75/75) w/ improved function and ROM Therapeutic left caudal ESI x1 (05/14/2019) (100/100/50/100) (100% relief of LEP as of 05/26/2021) (743 days) Therapeutic bilateral erector spinae & quadratus lumborum muscle MNB/TPI x2 (04/10/2018) (100/100/0/0)   Therapeutic  Palliative (PRN) options:   Therapeutic/palliative bilateral lumbar facet MBB    Pharmacotherapy  Nonopioids transferred 02/05/2020: Baclofen        Recent Visits Date Type Provider Dept  05/10/22  Office Visit Milinda Pointer, MD [facility]  03/29/22 Procedure visit Milinda Pointer, MD [facility]  03/03/22 Procedure visit Milinda Pointer, MD [facility]  Showing recent visits within past 90 days and meeting all other requirements Today's Visits Date Type Provider Dept  05/23/22 Office Visit Milinda Pointer, MD [facility]  Showing today's visits and meeting all other requirements Future Appointments No visits were found meeting these conditions. Showing future appointments within next 90 days and meeting all other requirements  I discussed the assessment and treatment plan with the patient. The patient was provided an opportunity to ask questions and all were answered. The patient agreed with the plan and demonstrated an understanding of the instructions.  Patient advised to call back or seek an in-person evaluation if the symptoms or condition worsens.  Duration of encounter: 30 minutes.  Total time on encounter, as per AMA guidelines included both the face-to-face and non-face-to-face time personally spent by the physician and/or other qualified health care professional(s) on the day of the encounter (includes time in activities that require the physician or other qualified health care professional and does not include time in activities normally performed by clinical staff). Physician's time may include the following activities when performed: Preparing to see the patient (e.g., pre-charting review of records, searching for previously ordered imaging, lab work, and  nerve conduction tests) Review of prior analgesic pharmacotherapies. Reviewing PMP Interpreting ordered tests (e.g., lab work, imaging, nerve conduction tests) Performing post-procedure evaluations, including interpretation of diagnostic procedures Obtaining and/or reviewing separately obtained history Performing a medically appropriate examination and/or  evaluation Counseling and educating the patient/family/caregiver Ordering medications, tests, or procedures Referring and communicating with other health care professionals (when not separately reported) Documenting clinical information in the electronic or other health record Independently interpreting results (not separately reported) and communicating results to the patient/ family/caregiver Care coordination (not separately reported)  Note by: Gaspar Cola, MD Date: 05/23/2022; Time: 8:27 AM

## 2022-05-23 NOTE — Patient Instructions (Signed)
____________________________________________________________________________________________  Patient Information update  To: All of our patients.  Re: Name change.  It has been made official that our current name, "[facility]"   will soon be changed to "[facility]".   The purpose of this change is to eliminate any confusion created by the concept of our practice being a "Medication Management Pain Clinic". In the past this has led to the misconception that we treat pain primarily by the use of prescription medications.  Nothing can be farther from the truth.   Understanding PAIN MANAGEMENT: To further understand what our practice does, you first have to understand that "Pain Management" is a subspecialty that requires additional training once a physician has completed their specialty training, which can be in either Anesthesia, Neurology, Psychiatry, or Physical Medicine and Rehabilitation (PMR). Each one of these contributes to the final approach taken by each physician to the management of their patient's pain. To be a "Pain Management Specialist" you must have first completed one of the specialty trainings below.  Anesthesiologists - trained in clinical pharmacology and interventional techniques such as nerve blockade and regional as well as central neuroanatomy. They are trained to block pain before, during, and after surgical interventions.  Neurologists - trained in the diagnosis and pharmacological treatment of complex neurological conditions, such as Multiple Sclerosis, Parkinson's, spinal cord injuries, and other systemic conditions that may be associated with symptoms that may include but are not limited to pain. They tend to rely primarily on the treatment of chronic pain using prescription medications.  Psychiatrist - trained in conditions affecting the psychosocial  wellbeing of patients including but not limited to depression, anxiety, schizophrenia, personality disorders, addiction, and other substance use disorders that may be associated with chronic pain. They tend to rely primarily on the treatment of chronic pain using prescription medications.   Physical Medicine and Rehabilitation (PMR) physicians, also known as physiatrists - trained to treat a wide variety of medical conditions affecting the brain, spinal cord, nerves, bones, joints, ligaments, muscles, and tendons. Their training is primarily aimed at treating patients that have suffered injuries that have caused severe physical impairment. Their training is primarily aimed at the physical therapy and rehabilitation of those patients. They may also work alongside orthopedic surgeons or neurosurgeons using their expertise in assisting surgical patients to recover after their surgeries.  INTERVENTIONAL PAIN MANAGEMENT is sub-subspecialty of Pain Management.  Our physicians are Board-certified in Anesthesia, Pain Management, and Interventional Pain Management.  This meaning that not only have they been trained and Board-certified in their specialty of Anesthesia, and subspecialty of Pain Management, but they have also received further training in the sub-subspecialty of Interventional Pain Management, in order to become Board-certified as INTERVENTIONAL PAIN MANAGEMENT SPECIALIST.    Mission: Our goal is to use our skills in  [place] as alternatives to the chronic use of prescription opioid medications for the treatment of pain. To make this more clear, we have changed our name to reflect what we do and offer. We will continue to offer medication management assessment and recommendations, but we will not be taking over any patient's medication management.  ____________________________________________________________________________________________      ____________________________________________________________________________________________  Opioid Pain Medication Update  To: All patients taking opioid pain medications. (I.e.: hydrocodone, hydromorphone, oxycodone, oxymorphone, morphine, codeine, methadone, tapentadol, tramadol, buprenorphine, fentanyl, etc.)  Re: Updated review of side effects and adverse reactions of opioid analgesics, as well as new  information about long term effects of this class of medications.  Direct risks of long-term opioid therapy are not limited to opioid addiction and overdose. Potential medical risks include serious fractures, breathing problems during sleep, hyperalgesia, immunosuppression, chronic constipation, bowel obstruction, myocardial infarction, and tooth decay secondary to xerostomia.  Unpredictable adverse effects that can occur even if you take your medication correctly: Cognitive impairment, respiratory depression, and death. Most people think that if they take their medication "correctly", and "as instructed", that they will be safe. Nothing could be farther from the truth. In reality, a significant amount of recorded deaths associated with the use of opioids has occurred in individuals that had taken the medication for a long time, and were taking their medication correctly. The following are examples of how this can happen: Patient taking his/her medication for a long time, as instructed, without any side effects, is given a certain antibiotic or another unrelated medication, which in turn triggers a "Drug-to-drug interaction" leading to disorientation, cognitive impairment, impaired reflexes, respiratory depression or an untoward event leading to serious bodily harm or injury, including death.  Patient taking his/her medication for a long time, as instructed, without any side effects, develops an acute impairment of liver and/or kidney function. This will lead to a rapid inability of the body to  breakdown and eliminate their pain medication, which will result in effects similar to an "overdose", but with the same medicine and dose that they had always taken. This again may lead to disorientation, cognitive impairment, impaired reflexes, respiratory depression or an untoward event leading to serious bodily harm or injury, including death.  A similar problem will occur with patients as they grow older and their liver and kidney function begins to decrease as part of the aging process.  Background information: Historically, the original case for using long-term opioid therapy to treat chronic noncancer pain was based on safety assumptions that subsequent experience has called into question. In 1996, the American Pain Society and the [place] [employer] issued a consensus statement supporting long-term opioid therapy. This statement acknowledged the dangers of opioid prescribing but concluded that the risk for addiction was low; respiratory depression induced by opioids was short-lived, occurred mainly in opioid-naive patients, and was antagonized by pain; tolerance was not a common problem; and efforts to control diversion should not constrain opioid prescribing. This has now proven to be wrong. Experience regarding the risks for opioid addiction, misuse, and overdose in community practice has failed to support these assumptions.  According to the Centers for Disease Control and Prevention, fatal overdoses involving opioid analgesics have increased sharply over the past decade. Currently, more than 96,700 people die from drug overdoses every year. Opioids are a factor in 7 out of every 10 overdose deaths. Deaths from drug overdose have surpassed motor vehicle accidents as the leading cause of death for individuals between the ages of 12 and 46.  Clinical data suggest that neuroendocrine dysfunction may be very common in both men and women, potentially causing hypogonadism, erectile  dysfunction, infertility, decreased libido, osteoporosis, and depression. Recent studies linked higher opioid dose to increased opioid-related mortality. Controlled observational studies reported that long-term opioid therapy may be associated with increased risk for cardiovascular events. Subsequent meta-analysis concluded that the safety of long-term opioid therapy in elderly patients has not been proven.   Side Effects and adverse reactions: Common side effects: Drowsiness (sedation). Dizziness. Nausea and vomiting. Constipation. Physical dependence -- Dependence often manifests with withdrawal symptoms when opioids are discontinued  or decreased. Tolerance -- As you take repeated doses of opioids, you require increased medication to experience the same effect of pain relief. Respiratory depression -- This can occur in healthy people, especially with higher doses. However, people with COPD, asthma or other lung conditions may be even more susceptible to fatal respiratory impairment.  Uncommon side effects: An increased sensitivity to feeling pain and extreme response to pain (hyperalgesia). Chronic use of opioids can lead to this. Delayed gastric emptying (the process by which the contents of your stomach are moved into your small intestine). Muscle rigidity. Immune system and hormonal dysfunction. Quick, involuntary muscle jerks (myoclonus). Arrhythmia. Itchy skin (pruritus). Dry mouth (xerostomia).  Long-term side effects: Chronic constipation. Sleep-disordered breathing (SDB). Increased risk of bone fractures. Hypothalamic-pituitary-adrenal dysregulation. Increased risk of overdose.  RISKS: Fractures and Falls:  Opioids increase the risk and incidence of falls. This is of particular importance in elderly patients.  Endocrine System:  Long-term administration is associated with endocrine abnormalities. Influences on both the hypothalamic-pituitary-adrenal axis?and the  hypothalamic-pituitary-gonadal axis have been demonstrated with consequent hypogonadism and adrenal insufficiency in both sexes. Hypogonadism and decreased levels of dehydroepiandrosterone sulfate have been reported in men and women. Endocrine effects can lead to: Amenorrhoea in women Reduced libido in both sexes Erectile dysfunction in men Infertility Depression and fatigue Patients (particularly women of childbearing age) should avoid opioids. There is insufficient evidence to recommend routine monitoring of asymptomatic patients taking opioids in the long-term for hormonal deficiencies.  Immune System: Human studies have demonstrated that opioids have an immunomodulating effect. These effects are mediated via opioid receptors both on immune effector cells and in the central nervous system. Opioids have been demonstrated to have adverse effects on antimicrobial response and anti-tumour surveillance. Buprenorphine has been demonstrated to have no impact on immune function.  Opioid Induced Hyperalgesia: Human studies have demonstrated that prolonged use of opioids can lead to a state of abnormal pain sensitivity, sometimes called opioid induced hyperalgesia (OIH). Opioid induced hyperalgesia is not usually seen in the absence of tolerance to opioid analgesia. Clinically, hyperalgesia may be diagnosed if the patient on long-term opioid therapy presents with increased pain. This might be qualitatively and anatomically distinct from pain related to disease progression or to breakthrough pain resulting from development of opioid tolerance. Pain associated with hyperalgesia tends to be more diffuse than the pre-existing pain and less defined in quality. Management of opioid induced hyperalgesia requires opioid dose reduction.  Cancer: Chronic opioid therapy has been associated with an increased risk of cancer among noncancer patients with chronic pain. This association was more evident in chronic  strong opioid users. Chronic opioid consumption causes significant pathological changes in the small intestine and colon. Epidemiological studies have found that there is a link between opium dependence and initiation of gastrointestinal cancers. Cancer is the second leading cause of death after cardiovascular disease. Chronic use of opioids can cause multiple conditions such as GERD, immunosuppression and renal damage as well as carcinogenic effects, which are associated with the incidence of cancers.   Mortality: Long-term opioid use has been associated with increased mortality among patients with chronic non-cancer pain (CNCP).  Prescription of long-acting opioids for chronic noncancer pain was associated with a significantly increased risk of all-cause mortality, including deaths from causes other than overdose.  Reference: Von Korff M, Kolodny A, Deyo RA, Chou R. Long-term opioid therapy reconsidered. Ann Intern Med. 2011 Sep 6;155(5):325-8. doi: 10.7326/[id number]. PMID: [id number]; PMCIDLD:501236. Alexandria Lodge RA, Dunn KM,  [place] KP. Risk of adverse events in patients prescribed long-term opioids: A cohort study in the [place] Clinical Practice Research Datalink. Eur J Pain. 2019 May;23(5):908-922. doi: 10.1002/ejp.1357. Epub 2019 Jan 31. PMID: [id number]. Colameco S, Coren JS, Ciervo CA. Continuous opioid treatment for chronic noncancer pain: a time for moderation in prescribing. Postgrad Med. 2009 Jul;121(4):61-6. doi: 10.3810/pgm.2009.07.2032. PMID: [id number]. Heywood Bene RN, [place], Blazina I, Rosalio Loud, Bougatsos C, Deyo RA. The effectiveness and risks of long-term opioid therapy for chronic pain: a systematic review for a [employer] of Health Pathways to [employer]. Ann Intern Med. 2015 Feb 17;162(4):276-86. doi: [id number]. PMID: [id number]. Marjory Sneddon [facility], [place] DM. NCHS Data Brief No. 22. [place]:  Centers for Disease Control and Prevention; 2009. Sep, Increase in Fatal Poisonings Involving Opioid Analgesics in the [place], 1999-2006. Song IA, Choi HR, Oh TK. Long-term opioid use and mortality in patients with chronic non-cancer pain: Ten-year follow-up study in [place] from 2010 through 2019. EClinicalMedicine. 2022 Jul 18;51:101558. doi: 10.1016/j.eclinm.2022.UB:5887891. PMID: [id number]; PMCIDOX:8550940. Huser, W., Schubert, T., Vogelmann, T. et al. All-cause mortality in patients with long-term opioid therapy compared with non-opioid analgesics for chronic non-cancer pain: a database study. [place] Med 18, 162 (2020). [email or web address]://www.west.com/ Rashidian H, Roxy Cedar, Malekzadeh R, Haghdoost AA. An Ecological Study of the Association between Opiate Use and Incidence of Cancers. Addict Health. 2016 Fall;8(4):252-260. PMID: [id number]; PMCIDQI:9185013.  Our Goal: Our goal is to control your pain with means other than the use of opioid pain medications.  Our Recommendation: Talk to your physician about coming off of these medications. We can assist you with the tapering down and stopping these medicines. Based on the new information, even if you cannot completely stop the medication, a decrease in the dose may be associated with a lesser risk. Ask for other means of controlling the pain. Decrease or eliminate those factors that significantly contribute to your pain such as smoking, obesity, and a diet heavily tilted towards "inflammatory" nutrients.  ____________________________________________________________________________________________     ____________________________________________________________________________________________  Carron Brazen Pain Medication Shortage  The [place] is experiencing worsening drug shortages. These have had a negative widespread effect on patient care and treatment. Not expected to improve any time soon. Predicted to last past  2029.   Drug shortage list (generic names) Oxycodone IR Oxycodone/APAP Oxymorphone IR Hydromorphone Hydrocodone/APAP Morphine  Where is the problem?  Manufacturing and supply level.  Will this shortage affect you?  Only if you take any of the above pain medications.  How? You may be unable to fill your prescription.  Your pharmacist may offer a "partial fill" of your prescription. (Warning: Do not accept partial fills.) Prescriptions partially filled cannot be transferred to another pharmacy. Read our Medication Rules and Regulation. Depending on how much medicine you are dependent on, you may experience withdrawals when unable to get the medication.  Recommendations: Consider ending your dependence on opioid pain medications. Ask your pain specialist to assist you with the process. Consider switching to a medication currently not in shortage, such as Buprenorphine. Talk to your pain specialist about this option. Consider decreasing your pain medication requirements by managing tolerance thru "Drug Holidays". This may help minimize withdrawals, should you run out of medicine. Control your pain thru the use of non-pharmacological interventional therapies.   Your prescriber: Prescribers cannot be blamed for shortages. Medication manufacturing and supply issues cannot be fixed by the prescriber.   NOTE: The prescriber is not responsible for supplying  the medication, or solving supply issues. Work with your pharmacist to solve it. The patient is responsible for the decision to take or continue taking the medication and for identifying and securing a legal supply source. By law, supplying the medication is the job and responsibility of the pharmacy. The prescriber is responsible for the evaluation, monitoring, and prescribing of these medications.   Prescribers will NOT: Re-issue prescriptions that have been partially filled. Re-issue prescriptions already sent to a pharmacy.  Re-send  prescriptions to a different pharmacy because yours did not have your medication. Ask pharmacist to order more medicine or transfer the prescription to another pharmacy. (Read below.)  New 2023 regulation: "November 26, 2021 Revised Regulation Allows DEA-Registered Pharmacies to Transfer Electronic Prescriptions at a Patient's Request [place] Patients now have the ability to request their electronic prescription be transferred to another pharmacy without having to go back to their practitioner to initiate the request. This revised regulation went into effect on Monday, November 22, 2021.     At a patient's request, a DEA-registered retail pharmacy can now transfer an electronic prescription for a controlled substance (schedules II-V) to another DEA-registered retail pharmacy. Prior to this change, patients would have to go through their practitioner to cancel their prescription and have it re-issued to a different pharmacy. The process was taxing and time consuming for both patients and practitioners.    The Drug Enforcement Administration [facility]) published its intent to revise the process for transferring electronic prescriptions on February 14, 2020.  The final rule was published in the federal register on October 21, 2021 and went into effect 30 days later.  Under the final rule, a prescription can only be transferred once between pharmacies, and only if allowed under existing state or other applicable law. The prescription must remain in its electronic form; may not be altered in any way; and the transfer must be communicated directly between two licensed pharmacists. It's important to note, any authorized refills transfer with the original prescription, which means the entire prescription will be filled at the same pharmacy".  Reference: [employer] [facility]  website announcement)  [email or web address] ([place])   [employer] / Vol. 88, No. 143 / Thursday, October 21, 2021 / Rules and Regulations DEPARTMENT OF JUSTICE  Drug Enforcement Administration  21 CFR Part 1306  [Docket No. [id number]]  RIN [id number] Transfer of Electronic Prescriptions for Schedules II-V Controlled Substances Between Pharmacies for Initial Filling  ____________________________________________________________________________________________     _______________________________________________________________________  Medication Rules  Purpose: To inform patients, and their family members, of our medication rules and regulations.  Applies to: All patients receiving prescriptions from our practice (written or electronic).  Pharmacy of record: This is the pharmacy where your electronic prescriptions will be sent. Make sure we have the correct one.  Electronic prescriptions: In compliance with the [place] (STOP) Act of 2017 (Session Lanny Cramp [phone]), effective March 28, 2018, all controlled substances must be electronically prescribed. Written prescriptions, faxing, or calling prescriptions to a pharmacy will no longer be done.  Prescription refills: These will be provided only during in-person appointments. No medications will be renewed without a "face-to-face" evaluation with your provider. Applies to all prescriptions.  NOTE: The following applies primarily to controlled substances (Opioid* Pain Medications).   Type of encounter (visit): For patients receiving controlled substances, face-to-face visits are required. (Not an option and not up to the patient.)  Patient's responsibilities: Pain Pills: Bring  all pain pills to every appointment (except for procedure appointments). Pill Bottles: Bring pills in original pharmacy bottle. Bring  bottle, even if empty. Always bring the bottle of the most recent fill.  Medication refills: You are responsible for knowing and keeping track of what medications you are taking and when is it that you will need a refill. The day before your appointment: write a list of all prescriptions that need to be refilled. The day of the appointment: give the list to the admitting nurse. Prescriptions will be written only during appointments. No prescriptions will be written on procedure days. If you forget a medication: it will not be "Called in", "Faxed", or "electronically sent". You will need to get another appointment to get these prescribed. No early refills. Do not call asking to have your prescription filled early. Partial  or short prescriptions: Occasionally your pharmacy may not have enough pills to fill your prescription.  NEVER ACCEPT a partial fill or a prescription that is short of the total amount of pills that you were prescribed.  With controlled substances the law allows 72 hours for the pharmacy to complete the prescription.  If the prescription is not completed within 72 hours, the pharmacist will require a new prescription to be written. This means that you will be short on your medicine and we WILL NOT send another prescription to complete your original prescription.  Instead, request the pharmacy to send a carrier to a nearby branch to get enough medication to provide you with your full prescription. Prescription Accuracy: You are responsible for carefully inspecting your prescriptions before leaving our office. Have the discharge nurse carefully go over each prescription with you, before taking them home. Make sure that your name is accurately spelled, that your address is correct. Check the name and dose of your medication to make sure it is accurate. Check the number of pills, and the written instructions to make sure they are clear and accurate. Make sure that you are given enough medication  to last until your next medication refill appointment. Taking Medication: Take medication as prescribed. When it comes to controlled substances, taking less pills or less frequently than prescribed is permitted and encouraged. Never take more pills than instructed. Never take the medication more frequently than prescribed.  Inform other Doctors: Always inform, all of your healthcare providers, of all the medications you take. Pain Medication from other Providers: You are not allowed to accept any additional pain medication from any other Doctor or Healthcare provider. There are two exceptions to this rule. (see below) In the event that you require additional pain medication, you are responsible for notifying us, as stated below. Cough Medicine: Often these contain an opioid, such as codeine or hydrocodone. Never accept or take cough medicine containing these opioids if you are already taking an opioid* medication. The combination may cause respiratory failure and death. Medication Agreement: You are responsible for carefully reading and following our Medication Agreement. This must be signed before receiving any prescriptions from our practice. Safely store a copy of your signed Agreement. Violations to the Agreement will result in no further prescriptions. (Additional copies of our Medication Agreement are available upon request.) Laws, Rules, & Regulations: All patients are expected to follow all Federal and [employer], Rules, [facility] [facility]. Ignorance of the Laws does not constitute a valid excuse.  Illegal drugs and Controlled Substances: The use of illegal substances (including, but not limited to marijuana and its derivatives) and/or the illegal use of  any controlled substances is strictly prohibited. Violation of this rule may result in the immediate and permanent discontinuation of any and all prescriptions being written by our practice. The use of any illegal substances is  prohibited. Adopted CDC guidelines & recommendations: Target dosing levels will be at or below 60 MME/day. Use of benzodiazepines** is not recommended.  Exceptions: There are only two exceptions to the rule of not receiving pain medications from other Healthcare Providers. Exception #1 (Emergencies): In the event of an emergency (i.e.: accident requiring emergency care), you are allowed to receive additional pain medication. However, you are responsible for: As soon as you are able, call our office [phone], at any time of the day or night, and leave a message stating your name, the date and nature of the emergency, and the name and dose of the medication prescribed. In the event that your call is answered by a member of our staff, make sure to document and save the date, time, and the name of the person that took your information.  Exception #2 (Planned Surgery): In the event that you are scheduled by another doctor or dentist to have any type of surgery or procedure, you are allowed (for a period no longer than 30 days), to receive additional pain medication, for the acute post-op pain. However, in this case, you are responsible for picking up a copy of our "Post-op Pain Management for Surgeons" handout, and giving it to your surgeon or dentist. This document is available at our office, and does not require an appointment to obtain it. Simply go to our office during business hours (Monday-Thursday from 8:00 AM to 4:00 PM) (Friday 8:00 AM to 12:00 Noon) or if you have a scheduled appointment with [place], prior to your surgery, and ask for it by name. In addition, you are responsible for: calling our office [phone], at any time of the day or night, and leaving a message stating your name, name of your surgeon, type of surgery, and date of procedure or surgery. Failure to comply with your responsibilities may result in termination of therapy involving the controlled substances. Medication Agreement  Violation. Following the above rules, including your responsibilities will help you in avoiding a Medication Agreement Violation ("Breaking your Pain Medication Contract").  Consequences:  Not following the above rules may result in permanent discontinuation of medication prescription therapy.  *Opioid medications include: morphine, codeine, oxycodone, oxymorphone, hydrocodone, hydromorphone, meperidine, tramadol, tapentadol, buprenorphine, fentanyl, methadone. **Benzodiazepine medications include: diazepam (Valium), alprazolam (Xanax), clonazepam (Klonopine), lorazepam (Ativan), clorazepate (Tranxene), chlordiazepoxide (Librium), estazolam (Prosom), oxazepam (Serax), temazepam (Restoril), triazolam (Halcion) (Last updated: 01/18/2022) ______________________________________________________________________    ______________________________________________________________________  Medication Recommendations and Reminders  Applies to: All patients receiving prescriptions (written and/or electronic).  Medication Rules & Regulations: You are responsible for reading, knowing, and following our "Medication Rules" document. These exist for your safety and that of others. They are not flexible and neither are we. Dismissing or ignoring them is an act of "non-compliance" that may result in complete and irreversible termination of such medication therapy. For safety reasons, "non-compliance" will not be tolerated. As with the [place] fundamental legal principle of "ignorance of the law is no defense", we will accept no excuses for not having read and knowing the content of documents provided to you by our practice.  Pharmacy of record:  Definition: This is the pharmacy where your electronic prescriptions will be sent.  We do not endorse any particular pharmacy. It is up to you and your insurance  to decide what pharmacy to use.  We do not restrict you in your choice of pharmacy. However, once we write for  your prescriptions, we will NOT be re-sending more prescriptions to fix restricted supply problems created by your pharmacy, or your insurance.  The pharmacy listed in the electronic medical record should be the one where you want electronic prescriptions to be sent. If you choose to change pharmacy, simply notify our nursing staff. Changes will be made only during your regular appointments and not over the phone.  Recommendations: Keep all of your pain medications in a safe place, under lock and key, even if you live alone. We will NOT replace lost, stolen, or damaged medication. We do not accept "Police Reports" as proof of medications having been stolen. After you fill your prescription, take 1 week's worth of pills and put them away in a safe place. You should keep a separate, properly labeled bottle for this purpose. The remainder should be kept in the original bottle. Use this as your primary supply, until it runs out. Once it's gone, then you know that you have 1 week's worth of medicine, and it is time to come in for a prescription refill. If you do this correctly, it is unlikely that you will ever run out of medicine. To make sure that the above recommendation works, it is very important that you make sure your medication refill appointments are scheduled at least 1 week before you run out of medicine. To do this in an effective manner, make sure that you do not leave the office without scheduling your next medication management appointment. Always ask the nursing staff to show you in your prescription , when your medication will be running out. Then arrange for the receptionist to get you a return appointment, at least 7 days before you run out of medicine. Do not wait until you have 1 or 2 pills left, to come in. This is very poor planning and does not take into consideration that we may need to cancel appointments due to bad weather, sickness, or emergencies affecting our staff. DO NOT ACCEPT A  "Partial Fill": If for any reason your pharmacy does not have enough pills/tablets to completely fill or refill your prescription, do not allow for a "partial fill". The law allows the pharmacy to complete that prescription within 72 hours, without requiring a new prescription. If they do not fill the rest of your prescription within those 72 hours, you will need a separate prescription to fill the remaining amount, which we will NOT provide. If the reason for the partial fill is your insurance, you will need to talk to the pharmacist about payment alternatives for the remaining tablets, but again, DO NOT ACCEPT A PARTIAL FILL, unless you can trust your pharmacist to obtain the remainder of the pills within 72 hours.  Prescription refills and/or changes in medication(s):  Prescription refills, and/or changes in dose or medication, will be conducted only during scheduled medication management appointments. (Applies to both, written and electronic prescriptions.) No refills on procedure days. No medication will be changed or started on procedure days. No changes, adjustments, and/or refills will be conducted on a procedure day. Doing so will interfere with the diagnostic portion of the procedure. No phone refills. No medications will be "called into the pharmacy". No Fax refills. No weekend refills. No Holliday refills. No after hours refills.  Remember:  Business hours are:  Monday to Thursday 8:00 AM to 4:00 PM Provider's Schedule: [place]  Dossie Arbour, MD - Appointments are:  Medication management: Monday and Wednesday 8:00 AM to 4:00 PM Procedure day: Tuesday and Thursday 7:30 AM to 4:00 PM [name], MD - Appointments are:  Medication management: Tuesday and Thursday 8:00 AM to 4:00 PM Procedure day: Monday and Wednesday 7:30 AM to 4:00 PM (Last update: 01/18/2022) ______________________________________________________________________     ____________________________________________________________________________________________  Drug Holidays  What is a "Drug Holiday"? Drug Holiday: is the name given to the process of slowly tapering down and temporarily stopping the pain medication for the purpose of decreasing or eliminating tolerance to the drug.  Benefits Improved effectiveness Decreased required effective dose Improved pain control End dependence on high dose therapy Decrease cost of therapy Uncovering "opioid-induced hyperalgesia". (OIH)  What is "opioid hyperalgesia"? It is a paradoxical increase in pain caused by exposure to opioids. Stopping the opioid pain medication, contrary to the expected, it actually decreases or completely eliminates the pain. Ref.: "A comprehensive review of opioid-induced hyperalgesia". Brion Aliment, et.al. Pain Physician. 2011 Mar-Apr;14(2):145-61.  What is tolerance? Tolerance: the progressive loss of effectiveness of a pain medicine due to repetitive use. A common problem of opioid pain medications.  How long should a "Drug Holiday" last? Effectiveness depends on the patient staying off all opioid pain medicines for a minimum of 14 consecutive days. (2 weeks)  How about just taking less of the medicine? Does not work. Will not accomplish goal of eliminating the excess receptors.  How about switching to a different pain medicine? (AKA. "Opioid rotation") Does not work. Creates the illusion of effectiveness by taking advantage of inaccurate equivalent dose calculations between different opioids. -This "technique" was promoted by studies funded by [employer], such as [employer], creators of "OxyContin".  Can I stop the medicine "cold [place]"? Depends. You should always coordinate with your Pain Specialist to make the transition as smoothly as possible. Avoid stopping the medicine abruptly without consulting. We recommend a "slow taper".  What is a slow  taper? Taper: refers to the gradual decrease in dose.   How do I stop/taper the dose? Slowly. Decrease the daily amount of pills that you take by one (1) pill every seven (7) days. This is called a "slow downward taper". Example: if you normally take four (4) pills per day, drop it to three (3) pills per day for seven (7) days, then to two (2) pills per day for seven (7) days, then to one (1) per day for seven (7) days, and then stop the medicine. The 14 day "Drug Holiday" starts on the first day without medicine.   Will I experience withdrawals? Unlikely with a slow taper.  What triggers withdrawals? Withdrawals are triggered by the sudden/abrupt stop of high dose opioids. Withdrawals can be avoided by slowly decreasing the dose over a prolonged period of time.  What are withdrawals? Symptoms associated with sudden/abrupt reduction/stopping of high-dose, long-term use of pain medication. Withdrawal are seldom seen on low dose therapy, or patients rarely taking opioid medication.  Early Withdrawal Symptoms may include: Agitation Anxiety Muscle aches Increased tearing Insomnia Runny nose Sweating Yawning  Late symptoms may include: Abdominal cramping Diarrhea Dilated pupils Goose bumps Nausea Vomiting  (Last update: 03/06/2022) ____________________________________________________________________________________________    ____________________________________________________________________________________________  WARNING: CBD (cannabidiol) & Delta (Delta-8 tetrahydrocannabinol) products.   Applicable to:  All individuals currently taking or considering taking CBD (cannabidiol) and, more important, all patients taking opioid analgesic controlled substances (pain medication). (Example: oxycodone; oxymorphone; hydrocodone; hydromorphone; morphine; methadone; tramadol; tapentadol; fentanyl; buprenorphine; butorphanol; dextromethorphan;  meperidine; codeine; etc.)  Introduction:   Recently there has been a drive towards the use of "natural" products for the treatment of different conditions, including pain anxiety and sleep disorders. Marijuana and hemp are two varieties of the cannabis genus plants. Marijuana and its derivatives are illegal, while hemp and its derivatives are not. Cannabidiol (CBD) and tetrahydrocannabinol (THC), are two natural compounds found in plants of the Cannabis genus. They can both be extracted from hemp or marijuana. Both compounds interact with your body's endocannabinoid system in very different ways. CBD is associated with pain relief (analgesia) while THC is associated with the psychoactive effects ("the high") obtained from the use of marijuana products. There are two main types of THC: Delta-9, which comes from the marijuana plant and it is illegal, and Delta-8, which comes from the hemp plant, and it is legal. (Both, Delta-9-THC and Delta-8-THC are psychoactive and give you "the high".)   Legality:  Marijuana and its derivatives: illegal Hemp and its derivatives: Legal (State dependent) UPDATE: (05/14/2021) The Drug Enforcement Agency ([place]) issued a letter stating that "delta" cannabinoids, including Delta-8-THCO and Delta-9-THCO, synthetically derived from hemp do not qualify as hemp and will be viewed as Schedule I drugs. (Schedule I drugs, substances, or chemicals are defined as drugs with no currently accepted medical use and a high potential for abuse. Some examples of Schedule I drugs are: heroin, lysergic acid diethylamide (LSD), marijuana (cannabis), 3,4-methylenedioxymethamphetamine (ecstasy), methaqualone, and peyote.) (https://jennings.com/)  Legal status of CBD in [place]:  "Conditionally Legal"  Reference: "FDA Regulation of Cannabis and Cannabis-Derived Products, Including Cannabidiol (CBD)" - SeekArtists.com.pt  Warning:   CBD is not FDA approved and has not undergo the same manufacturing controls as prescription drugs.  This means that the purity and safety of available CBD may be questionable. Most of the time, despite manufacturer's claims, it is contaminated with THC (delta-9-tetrahydrocannabinol - the chemical in marijuana responsible for the "HIGH").  When this is the case, the [facility] contaminant will trigger a positive urine drug screen (UDS) test for Marijuana (carboxy-THC).   The FDA recently put out a warning about 5 things that everyone should be aware of regarding Delta-8 THC: Delta-8 THC products have not been evaluated or approved by the FDA for safe use and may be marketed in ways that put the public health at risk. The FDA has received adverse event reports involving delta-8 THC-containing products. Delta-8 THC has psychoactive and intoxicating effects. Delta-8 THC manufacturing often involve use of potentially harmful chemicals to create the concentrations of delta-8 THC claimed in the marketplace. The final delta-8 THC product may have potentially harmful by-products (contaminants) due to the chemicals used in the process. Manufacturing of delta-8 THC products may occur in uncontrolled or unsanitary settings, which may lead to the presence of unsafe contaminants or other potentially harmful substances. Delta-8 THC products should be kept out of the reach of children and pets.  NOTE: Because a positive UDS for any illicit substance is a violation of our medication agreement, your opioid analgesics (pain medicine) may be permanently discontinued.  MORE ABOUT CBD  General Information: CBD was discovered in [birth year] and it is a derivative of the cannabis sativa genus plants (Marijuana and Hemp). It is one of the 113 identified substances found in Marijuana. It accounts for up to 40% of the plant's extract. As of 2018, preliminary clinical studies on CBD included research for the treatment of anxiety, movement  disorders, and pain. CBD is available and consumed in multiple forms, including  inhalation of smoke or vapor, as an aerosol spray, and by mouth. It may be supplied as an oil containing CBD, capsules, dried cannabis, or as a liquid solution. CBD is thought not to be as psychoactive as THC (delta-9-tetrahydrocannabinol - the chemical in marijuana responsible for the "HIGH"). Studies suggest that CBD may interact with different biological target receptors in the body, including cannabinoid and other neurotransmitter receptors. As of 2018 the mechanism of action for its biological effects has not been determined.  Side-effects  Adverse reactions: Dry mouth, diarrhea, decreased appetite, fatigue, drowsiness, malaise, weakness, sleep disturbances, and others.  Drug interactions:  CBD may interact with medications such as blood-thinners. CBD causes drowsiness on its own and it will increase drowsiness caused by other medications, including antihistamines (such as Benadryl), benzodiazepines (Xanax, Ativan, Valium), antipsychotics, antidepressants, opioids, alcohol and supplements such as kava, melatonin and St. John's Wort.  Other drug interactions: Brivaracetam (Briviact); Caffeine; Carbamazepine (Tegretol); Citalopram (Celexa); Clobazam (Onfi); Eslicarbazepine (Aptiom); Everolimus (Zostress); Lithium; Methadone (Dolophine); Rufinamide (Banzel); Sedative medications (CNS depressants); Sirolimus (Rapamune); Stiripentol (Diacomit); Tacrolimus (Prograf); Tamoxifen ; Soltamox); Topiramate (Topamax); Valproate; Warfarin (Coumadin); Zonisamide. (Last update: 03/07/2022) ____________________________________________________________________________________________   ____________________________________________________________________________________________  Naloxone Nasal Spray  Why am I receiving this medication? [place] STOP ACT requires that all patients taking high dose opioids or at risk of opioids  respiratory depression, be prescribed an opioid reversal agent, such as Naloxone (AKA: Narcan).  What is this medication? NALOXONE (nal OX one) treats opioid overdose, which causes slow or shallow breathing, severe drowsiness, or trouble staying awake. Call emergency services after using this medication. You may need additional treatment. Naloxone works by reversing the effects of opioids. It belongs to a group of medications called opioid blockers.  COMMON BRAND NAME(S): Kloxxado, Narcan  What should I tell my care team before I take this medication? They need to know if you have any of these conditions: Heart disease Substance use disorder An unusual or allergic reaction to naloxone, other medications, foods, dyes, or preservatives Pregnant or trying to get pregnant Breast-feeding  When to use this medication? This medication is to be used for the treatment of respiratory depression (less than 8 breaths per minute) secondary to opioid overdose.   How to use this medication? This medication is for use in the nose. Lay the person on their back. Support their neck with your hand and allow the head to tilt back before giving the medication. The nasal spray should be given into 1 nostril. After giving the medication, move the person onto their side. Do not remove or test the nasal spray until ready to use. Get emergency medical help right away after giving the first dose of this medication, even if the person wakes up. You should be familiar with how to recognize the signs and symptoms of a narcotic overdose. If more doses are needed, give the additional dose in the other nostril. Talk to your care team about the use of this medication in children. While this medication may be prescribed for children as young as newborns for selected conditions, precautions do apply.  Naloxone Overdosage: If you think you have taken too much of this medicine contact a poison control center or emergency room at  once.  NOTE: This medicine is only for you. Do not share this medicine with others.  What if I miss a dose? This does not apply.  What may interact with this medication? This is only used during an emergency. No interactions are expected during emergency use.  This list may not describe all possible interactions. Give your health care provider a list of all the medicines, herbs, non-prescription drugs, or dietary supplements you use. Also tell them if you smoke, drink alcohol, or use illegal drugs. Some items may interact with your medicine.  What should I watch for while using this medication? Keep this medication ready for use in the case of an opioid overdose. Make sure that you have the phone number of your care team and local hospital ready. You may need to have additional doses of this medication. Each nasal spray contains a single dose. Some emergencies may require additional doses. After use, bring the treated person to the nearest hospital or call 911. Make sure the treating care team knows that the person has received a dose of this medication. You will receive additional instructions on what to do during and after use of this medication before an emergency occurs.  What side effects may I notice from receiving this medication? Side effects that you should report to your care team as soon as possible: Allergic reactions--skin rash, itching, hives, swelling of the face, lips, tongue, or throat Side effects that usually do not require medical attention (report these to your care team if they continue or are bothersome): Constipation Dryness or irritation inside the nose Headache Increase in blood pressure Muscle spasms Stuffy nose Toothache This list may not describe all possible side effects. Call your doctor for medical advice about side effects. You may report side effects to FDA at 1-800-FDA-1088.  Where should I keep my medication? Because this is an emergency medication, you  should keep it with you at all times.  Keep out of the reach of children and pets. Store between 20 and 25 degrees C (68 and 77 degrees F). Do not freeze. Throw away any unused medication after the expiration date. Keep in original box until ready to use.  NOTE: This sheet is a summary. It may not cover all possible information. If you have questions about this medicine, talk to your doctor, pharmacist, or health care provider.   2023 Elsevier/Gold Standard (2020-11-20 00:00:00)  ____________________________________________________________________________________________

## 2022-06-21 DIAGNOSIS — M47816 Spondylosis without myelopathy or radiculopathy, lumbar region: Secondary | ICD-10-CM | POA: Diagnosis not present

## 2022-06-21 DIAGNOSIS — M48061 Spinal stenosis, lumbar region without neurogenic claudication: Secondary | ICD-10-CM | POA: Diagnosis not present

## 2022-06-21 DIAGNOSIS — M5136 Other intervertebral disc degeneration, lumbar region: Secondary | ICD-10-CM | POA: Diagnosis not present

## 2022-06-21 DIAGNOSIS — Z981 Arthrodesis status: Secondary | ICD-10-CM | POA: Diagnosis not present

## 2022-07-11 DIAGNOSIS — H401131 Primary open-angle glaucoma, bilateral, mild stage: Secondary | ICD-10-CM | POA: Diagnosis not present

## 2022-07-29 DIAGNOSIS — M5136 Other intervertebral disc degeneration, lumbar region: Secondary | ICD-10-CM | POA: Diagnosis not present

## 2022-07-29 DIAGNOSIS — M5126 Other intervertebral disc displacement, lumbar region: Secondary | ICD-10-CM | POA: Diagnosis not present

## 2022-07-29 DIAGNOSIS — Z981 Arthrodesis status: Secondary | ICD-10-CM | POA: Diagnosis not present

## 2022-07-29 DIAGNOSIS — M47816 Spondylosis without myelopathy or radiculopathy, lumbar region: Secondary | ICD-10-CM | POA: Diagnosis not present

## 2022-07-29 DIAGNOSIS — M48061 Spinal stenosis, lumbar region without neurogenic claudication: Secondary | ICD-10-CM | POA: Diagnosis not present

## 2022-07-30 ENCOUNTER — Other Ambulatory Visit: Payer: Self-pay | Admitting: Pain Medicine

## 2022-07-30 DIAGNOSIS — G894 Chronic pain syndrome: Secondary | ICD-10-CM

## 2022-07-30 DIAGNOSIS — G8929 Other chronic pain: Secondary | ICD-10-CM

## 2022-07-30 DIAGNOSIS — Z79891 Long term (current) use of opiate analgesic: Secondary | ICD-10-CM

## 2022-07-30 DIAGNOSIS — M47816 Spondylosis without myelopathy or radiculopathy, lumbar region: Secondary | ICD-10-CM

## 2022-07-30 DIAGNOSIS — Z79899 Other long term (current) drug therapy: Secondary | ICD-10-CM

## 2022-07-30 DIAGNOSIS — M7918 Myalgia, other site: Secondary | ICD-10-CM

## 2022-07-30 DIAGNOSIS — M961 Postlaminectomy syndrome, not elsewhere classified: Secondary | ICD-10-CM

## 2022-08-07 NOTE — Progress Notes (Unsigned)
PROVIDER NOTE: Information contained herein reflects review and annotations entered in association with encounter. Interpretation of such information and data should be left to medically-trained personnel. Information provided to patient can be located elsewhere in the medical record under "Patient Instructions". Document created using STT-dictation technology, any transcriptional errors that may result from process are unintentional.    Patient: [name]  Service Category: E/M  Provider: Oswaldo Done, MD  DOB: 01-22-1961  DOS: 08/08/2022  Referring Provider: Myrlene Broker, *  MRN: [id number]  Specialty: Interventional Pain Management  PCP: Myrlene Broker, MD  Type: Established Patient  Setting: Ambulatory outpatient    Location: Office  Delivery: Face-to-face     HPI  Mr. [name], a 62 y.o. year old male, is here today because of his Chronic pain syndrome [G89.4]. Mr. Encarnacion primary complain today is No chief complaint on file.  Pertinent problems: Mr. Knippenberg has Chronic low back pain (1ry area of Pain) (Bilateral) (L>R) w/o sciatica; Chronic pain syndrome; Chronic lower extremity pain (2ry area of Pain) (Left); Chronic lumbar radicular pain (L5) (Left); Retrolisthesis (4-5 mm) of L2/L3 and L3/L4; Failed back surgical syndrome (Fusion L4-S1); Lumbar facet syndrome (Bilateral) (L>R); Spinal cord stimulator dysfunction (fractured leads); Lumbar facet osteoarthritis (Bilateral); Spondylosis without myelopathy or radiculopathy, lumbosacral region; Other specified dorsopathies, sacral and sacrococcygeal region; Chronic hip pain (Left); Chronic knee pain (Left); DDD (degenerative disc disease), lumbosacral; Neurogenic pain; Chronic upper back pain; Chronic musculoskeletal pain; Epidural fibrosis; Abnormal CT/myelogram scan, lumbar spine (08/28/2017); Myofascial pain syndrome of thoracic spine; and Chronic midline thoracic back pain on their pertinent problem list. Pain  Assessment: Severity of   is reported as a  /10. Location:    / . Onset:  . Quality:  . Timing:  . Modifying factor(s):  Marland Kitchen Vitals:  vitals were not taken for this visit.  BMI: Estimated body mass index is 33.72 kg/m as calculated from the following:   Height as of 05/23/22: 5\' 10"  (1.778 m).   Weight as of 05/23/22: 235 lb (106.6 kg). Last encounter: 05/23/2022. Last procedure: 03/29/2022.  Reason for encounter: medication management. ***  Routine UDS ordered today.   RTCB: 11/28/2022   Pharmacotherapy Assessment  Analgesic: Oxycodone IR 5 mg, 1 tab PO q 6 hrs (20 mg/day of oxycodone) + OxyContin 10 mg, 1 tab p.o. twice daily (20 mg/day of oxycodone) (total of 40 mg/day of oxycodone) MME/day: 60 mg/day.   Monitoring: [place] PMP: PDMP reviewed during this encounter.       Pharmacotherapy: No side-effects or adverse reactions reported. Compliance: No problems identified. Effectiveness: Clinically acceptable.  No notes on file  No results found for: "CBDTHCR" No results found for: "D8THCCBX" No results found for: "D9THCCBX"  UDS:  Summary  Date Value Ref Range Status  08/30/2021 Note  Final    Comment:    ==================================================================== [facility] (MW) ==================================================================== Test                             Result       Flag       Units  Drug Present and Declared for Prescription Verification   Oxycodone                      5213         EXPECTED   ng/mg creat   Oxymorphone  1250         EXPECTED   ng/mg creat   Noroxycodone                   >5587        EXPECTED   ng/mg creat   Noroxymorphone                 443          EXPECTED   ng/mg creat    Sources of oxycodone are scheduled prescription medications.    Oxymorphone, noroxycodone, and noroxymorphone are expected    metabolites of oxycodone. Oxymorphone is also available as a    scheduled prescription  medication.  ==================================================================== Test                      Result    Flag   Units      Ref Range   Creatinine              179              mg/dL      >=16 ==================================================================== Declared Medications:  The flagging and interpretation on this report are based on the  following declared medications.  Unexpected results may arise from  inaccuracies in the declared medications.   **Note: The testing scope of this panel includes these medications:   Oxycodone (OxyIR)   **Note: The testing scope of this panel does not include the  following reported medications:   Baclofen (Lioresal)  Esomeprazole (Nexium)  Gabapentin (Neurontin)  Ibuprofen (Advil)  Latanoprost (Xalatan)  Naloxone (Narcan) ==================================================================== For clinical consultation, please call [phone]. ====================================================================       ROS  Constitutional: Denies any fever or chills Gastrointestinal: No reported hemesis, hematochezia, vomiting, or acute GI distress Musculoskeletal: Denies any acute onset joint swelling, redness, loss of ROM, or weakness Neurological: No reported episodes of acute onset apraxia, aphasia, dysarthria, agnosia, amnesia, paralysis, loss of coordination, or loss of consciousness  Medication Review  baclofen, esomeprazole, gabapentin, ibuprofen, latanoprost, naloxone, and oxyCODONE  History Review  Allergy: Mr. Brunning has No Known Allergies. Drug: Mr. Joice  reports no history of drug use. Alcohol:  reports current alcohol use of about 3.0 standard drinks of alcohol per week. Tobacco:  reports that he has quit smoking. His smokeless tobacco use includes chew. Social: Mr. Pagel  reports that he has quit smoking. His smokeless tobacco use includes chew. He reports current alcohol use of about 3.0 standard  drinks of alcohol per week. He reports that he does not use drugs. Medical:  has a past medical history of Acute postoperative pain (11/21/2017), Allergic rhinitis, Chronic pain syndrome, DDD (degenerative disc disease), lumbosacral, GERD (gastroesophageal reflux disease), Hyperlipidemia, Osteoarthritis, and S/P appendectomy. Surgical: Mr. Oldenburg  has a past surgical history that includes Appendectomy; repairs of bilateral ankle fractures; Spinal fusion (N/A); and Spinal cord stimulator implant (N/A). Family: family history includes Diabetes in his maternal grandmother; Heart disease in his father.  Laboratory Chemistry Profile   Renal Lab Results  Component Value Date   BUN 24 (H) 04/29/2022   CREATININE 0.77 04/29/2022   BCR 27 (H) 12/12/2016   GFR 97.35 04/29/2022   GFRAA 120 12/12/2016   GFRNONAA 104 12/12/2016    Hepatic Lab Results  Component Value Date   AST 19 04/29/2022   ALT 19 04/29/2022   ALBUMIN 4.3 04/29/2022   ALKPHOS 90 04/29/2022   HCVAB NEGATIVE 12/30/2014  Electrolytes Lab Results  Component Value Date   NA 140 04/29/2022   K 3.8 04/29/2022   CL 104 04/29/2022   CALCIUM 9.3 04/29/2022   MG 1.9 12/12/2016    Bone Lab Results  Component Value Date   25OHVITD1 44 12/12/2016   25OHVITD2 <1.0 12/12/2016   25OHVITD3 43 12/12/2016    Inflammation (CRP: Acute Phase) (ESR: Chronic Phase) Lab Results  Component Value Date   CRP 2.5 12/12/2016   ESRSEDRATE 10 12/12/2016         Note: Above Lab results reviewed.  Recent Imaging Review  DG PAIN CLINIC C-ARM 1-60 MIN NO REPORT Fluoro was used, but no Radiologist interpretation will be provided.  Please refer to "NOTES" tab for provider progress note. Note: Reviewed        Physical Exam  General appearance: Well nourished, well developed, and well hydrated. In no apparent acute distress Mental status: Alert, oriented x 3 (person, place, & time)       Respiratory: No evidence of acute respiratory  distress Eyes: PERLA Vitals: There were no vitals taken for this visit. BMI: Estimated body mass index is 33.72 kg/m as calculated from the following:   Height as of 05/23/22: 5\' 10"  (1.778 m).   Weight as of 05/23/22: 235 lb (106.6 kg). Ideal: Patient weight not recorded  Assessment   Diagnosis Status  1. Chronic pain syndrome   2. Chronic low back pain (1ry area of Pain) (Bilateral) (L>R) w/o sciatica   3. Chronic lower extremity pain (2ry area of Pain) (Left)   4. Chronic upper back pain   5. Lumbar facet syndrome (Bilateral) (L>R)   6. Chronic musculoskeletal pain   7. Failed back surgical syndrome (Fusion L4-S1)   8. Pharmacologic therapy   9. Chronic use of opiate for therapeutic purpose   10. Encounter for medication management   11. Encounter for chronic pain management    Controlled Controlled Controlled   Updated Problems: No problems updated.  Plan of Care  Problem-specific:  No problem-specific Assessment & Plan notes found for this encounter.  Mr. RJAY KIRSHENBAUM has a current medication list which includes the following long-term medication(s): baclofen, esomeprazole, gabapentin, oxycodone, oxycodone, oxycodone, and oxycodone.  Pharmacotherapy (Medications Ordered): No orders of the defined types were placed in this encounter.  Orders:  No orders of the defined types were placed in this encounter.  Follow-up plan:   No follow-ups on file.      Interventional Therapies  Risk  Complexity Considerations:     NOTE: Poor candidate for RFA 2ry to Hardware   Planned  Pending:   Diagnostic/therapeutic midline thoracic TPI/MNB #1    Under consideration:   Therapeutic left caudal ESI #2  Diagnostic/therapeutic left L2-3 LESI #1  Diagnostic bilateral thoracolumbar facet MBB (L1-2 through L3-4) #1  Diagnostic/therapeutic bilateral L1-2 and L2-3 transforaminal ESI #1  Possible Racz procedure #1    Completed:   Diagnostic/therapeutic bilateral lumbar  facet MBB x3 (04/27/2021) (100/100/50/70)  Palliative right lumbar facet RFA x2 (03/29/2022) (100/100/50/50)  Palliative left lumbar facet RFA x2 (03/03/2022) (100/100/75/75) w/ improved function and ROM Therapeutic left caudal ESI x1 (05/14/2019) (100/100/50/100) (100% relief of LEP as of 05/26/2021) (743 days) Therapeutic bilateral erector spinae & quadratus lumborum muscle MNB/TPI x2 (04/10/2018) (100/100/0/0)   Therapeutic  Palliative (PRN) options:   Therapeutic/palliative bilateral lumbar facet MBB    Pharmacotherapy  Nonopioids transferred 02/05/2020: Baclofen         Recent Visits Date Type Provider Dept  05/23/22 Office Visit Delano Metz, MD [facility]  05/10/22 Office Visit Delano Metz, MD [facility]  Showing recent visits within past 90 days and meeting all other requirements Future Appointments Date Type Provider Dept  08/08/22 Appointment Delano Metz, MD [facility]  Showing future appointments within next 90 days and meeting all other requirements  I discussed the assessment and treatment plan with the patient. The patient was provided an opportunity to ask questions and all were answered. The patient agreed with the plan and demonstrated an understanding of the instructions.  Patient advised to call back or seek an in-person evaluation if the symptoms or condition worsens.  Duration of encounter: *** minutes.  Total time on encounter, as per AMA guidelines included both the face-to-face and non-face-to-face time personally spent by the physician and/or other qualified health care professional(s) on the day of the encounter (includes time in activities that require the physician or other qualified health care professional and does not include time in activities normally performed by clinical staff). Physician's time may include the following activities when performed: Preparing to see the patient (e.g., pre-charting  review of records, searching for previously ordered imaging, lab work, and nerve conduction tests) Review of prior analgesic pharmacotherapies. Reviewing PMP Interpreting ordered tests (e.g., lab work, imaging, nerve conduction tests) Performing post-procedure evaluations, including interpretation of diagnostic procedures Obtaining and/or reviewing separately obtained history Performing a medically appropriate examination and/or evaluation Counseling and educating the patient/family/caregiver Ordering medications, tests, or procedures Referring and communicating with other health care professionals (when not separately reported) Documenting clinical information in the electronic or other health record Independently interpreting results (not separately reported) and communicating results to the patient/ family/caregiver Care coordination (not separately reported)  Note by: Oswaldo Done, MD Date: 08/08/2022; Time: 11:38 AM

## 2022-08-08 ENCOUNTER — Ambulatory Visit: Payer: No Typology Code available for payment source | Attending: Pain Medicine | Admitting: Pain Medicine

## 2022-08-08 ENCOUNTER — Encounter: Payer: Self-pay | Admitting: Pain Medicine

## 2022-08-08 VITALS — BP 131/83 | HR 66 | Temp 97.2°F | Resp 18 | Ht 70.0 in | Wt 236.0 lb

## 2022-08-08 DIAGNOSIS — M7918 Myalgia, other site: Secondary | ICD-10-CM | POA: Diagnosis present

## 2022-08-08 DIAGNOSIS — M545 Low back pain, unspecified: Secondary | ICD-10-CM | POA: Diagnosis not present

## 2022-08-08 DIAGNOSIS — M961 Postlaminectomy syndrome, not elsewhere classified: Secondary | ICD-10-CM | POA: Insufficient documentation

## 2022-08-08 DIAGNOSIS — G894 Chronic pain syndrome: Secondary | ICD-10-CM | POA: Insufficient documentation

## 2022-08-08 DIAGNOSIS — M546 Pain in thoracic spine: Secondary | ICD-10-CM | POA: Diagnosis not present

## 2022-08-08 DIAGNOSIS — M549 Dorsalgia, unspecified: Secondary | ICD-10-CM | POA: Insufficient documentation

## 2022-08-08 DIAGNOSIS — M79605 Pain in left leg: Secondary | ICD-10-CM | POA: Diagnosis not present

## 2022-08-08 DIAGNOSIS — G8929 Other chronic pain: Secondary | ICD-10-CM | POA: Diagnosis present

## 2022-08-08 DIAGNOSIS — M47816 Spondylosis without myelopathy or radiculopathy, lumbar region: Secondary | ICD-10-CM | POA: Insufficient documentation

## 2022-08-08 DIAGNOSIS — Z79891 Long term (current) use of opiate analgesic: Secondary | ICD-10-CM | POA: Insufficient documentation

## 2022-08-08 DIAGNOSIS — Z79899 Other long term (current) drug therapy: Secondary | ICD-10-CM | POA: Diagnosis present

## 2022-08-08 MED ORDER — OXYCODONE HCL ER 10 MG PO T12A
10.0000 mg | EXTENDED_RELEASE_TABLET | Freq: Two times a day (BID) | ORAL | 0 refills | Status: DC
Start: 2022-08-30 — End: 2022-11-23

## 2022-08-08 MED ORDER — OXYCODONE HCL 5 MG PO TABS
5.0000 mg | ORAL_TABLET | Freq: Four times a day (QID) | ORAL | 0 refills | Status: DC | PRN
Start: 2022-09-29 — End: 2022-11-23

## 2022-08-08 MED ORDER — OXYCODONE HCL 5 MG PO TABS
5.0000 mg | ORAL_TABLET | Freq: Four times a day (QID) | ORAL | 0 refills | Status: DC | PRN
Start: 2022-10-29 — End: 2022-11-23

## 2022-08-08 MED ORDER — OXYCODONE HCL ER 10 MG PO T12A
10.0000 mg | EXTENDED_RELEASE_TABLET | Freq: Two times a day (BID) | ORAL | 0 refills | Status: DC
Start: 2022-10-29 — End: 2022-11-23

## 2022-08-08 MED ORDER — OXYCODONE HCL 5 MG PO TABS
5.0000 mg | ORAL_TABLET | Freq: Four times a day (QID) | ORAL | 0 refills | Status: DC | PRN
Start: 2022-08-30 — End: 2022-11-23

## 2022-08-08 MED ORDER — OXYCODONE HCL ER 10 MG PO T12A
10.0000 mg | EXTENDED_RELEASE_TABLET | Freq: Two times a day (BID) | ORAL | 0 refills | Status: DC
Start: 2022-09-29 — End: 2022-11-23

## 2022-08-08 NOTE — Patient Instructions (Signed)
____________________________________________________________________________________________  Opioid Pain Medication Update  To: All patients taking opioid pain medications. (I.e.: hydrocodone, hydromorphone, oxycodone, oxymorphone, morphine, codeine, methadone, tapentadol, tramadol, buprenorphine, fentanyl, etc.)  Re: Updated review of side effects and adverse reactions of opioid analgesics, as well as new information about long term effects of this class of medications.  Direct risks of long-term opioid therapy are not limited to opioid addiction and overdose. Potential medical risks include serious fractures, breathing problems during sleep, hyperalgesia, immunosuppression, chronic constipation, bowel obstruction, myocardial infarction, and tooth decay secondary to xerostomia.  Unpredictable adverse effects that can occur even if you take your medication correctly: Cognitive impairment, respiratory depression, and death. Most people think that if they take their medication "correctly", and "as instructed", that they will be safe. Nothing could be farther from the truth. In reality, a significant amount of recorded deaths associated with the use of opioids has occurred in individuals that had taken the medication for a long time, and were taking their medication correctly. The following are examples of how this can happen: Patient taking his/her medication for a long time, as instructed, without any side effects, is given a certain antibiotic or another unrelated medication, which in turn triggers a "Drug-to-drug interaction" leading to disorientation, cognitive impairment, impaired reflexes, respiratory depression or an untoward event leading to serious bodily harm or injury, including death.  Patient taking his/her medication for a long time, as instructed, without any side effects, develops an acute impairment of liver and/or kidney function. This will lead to a rapid inability of the body to  breakdown and eliminate their pain medication, which will result in effects similar to an "overdose", but with the same medicine and dose that they had always taken. This again may lead to disorientation, cognitive impairment, impaired reflexes, respiratory depression or an untoward event leading to serious bodily harm or injury, including death.  A similar problem will occur with patients as they grow older and their liver and kidney function begins to decrease as part of the aging process.  Background information: Historically, the original case for using long-term opioid therapy to treat chronic noncancer pain was based on safety assumptions that subsequent experience has called into question. In 1996, the American Pain Society and the American Academy of Pain Medicine issued a consensus statement supporting long-term opioid therapy. This statement acknowledged the dangers of opioid prescribing but concluded that the risk for addiction was low; respiratory depression induced by opioids was short-lived, occurred mainly in opioid-naive patients, and was antagonized by pain; tolerance was not a common problem; and efforts to control diversion should not constrain opioid prescribing. This has now proven to be wrong. Experience regarding the risks for opioid addiction, misuse, and overdose in community practice has failed to support these assumptions.  According to the Centers for Disease Control and Prevention, fatal overdoses involving opioid analgesics have increased sharply over the past decade. Currently, more than 96,700 people die from drug overdoses every year. Opioids are a factor in 7 out of every 10 overdose deaths. Deaths from drug overdose have surpassed motor vehicle accidents as the leading cause of death for individuals between the ages of 35 and 54.  Clinical data suggest that neuroendocrine dysfunction may be very common in both men and women, potentially causing hypogonadism, erectile  dysfunction, infertility, decreased libido, osteoporosis, and depression. Recent studies linked higher opioid dose to increased opioid-related mortality. Controlled observational studies reported that long-term opioid therapy may be associated with increased risk for cardiovascular events. Subsequent meta-analysis concluded   that the safety of long-term opioid therapy in elderly patients has not been proven.   Side Effects and adverse reactions: Common side effects: Drowsiness (sedation). Dizziness. Nausea and vomiting. Constipation. Physical dependence -- Dependence often manifests with withdrawal symptoms when opioids are discontinued or decreased. Tolerance -- As you take repeated doses of opioids, you require increased medication to experience the same effect of pain relief. Respiratory depression -- This can occur in healthy people, especially with higher doses. However, people with COPD, asthma or other lung conditions may be even more susceptible to fatal respiratory impairment.  Uncommon side effects: An increased sensitivity to feeling pain and extreme response to pain (hyperalgesia). Chronic use of opioids can lead to this. Delayed gastric emptying (the process by which the contents of your stomach are moved into your small intestine). Muscle rigidity. Immune system and hormonal dysfunction. Quick, involuntary muscle jerks (myoclonus). Arrhythmia. Itchy skin (pruritus). Dry mouth (xerostomia).  Long-term side effects: Chronic constipation. Sleep-disordered breathing (SDB). Increased risk of bone fractures. Hypothalamic-pituitary-adrenal dysregulation. Increased risk of overdose.  RISKS: Fractures and Falls:  Opioids increase the risk and incidence of falls. This is of particular importance in elderly patients.  Endocrine System:  Long-term administration is associated with endocrine abnormalities (endocrinopathies). (Also known as Opioid-induced Endocrinopathy) Influences  on both the hypothalamic-pituitary-adrenal axis?and the hypothalamic-pituitary-gonadal axis have been demonstrated with consequent hypogonadism and adrenal insufficiency in both sexes. Hypogonadism and decreased levels of dehydroepiandrosterone sulfate have been reported in men and women. Endocrine effects include: Amenorrhoea in women (abnormal absence of menstruation) Reduced libido in both sexes Decreased sexual function Erectile dysfunction in men Hypogonadisms (decreased testicular function with shrinkage of testicles) Infertility Depression and fatigue Loss of muscle mass Anxiety Depression Immune suppression Hyperalgesia Weight gain Anemia Osteoporosis Patients (particularly women of childbearing age) should avoid opioids. There is insufficient evidence to recommend routine monitoring of asymptomatic patients taking opioids in the long-term for hormonal deficiencies.  Immune System: Human studies have demonstrated that opioids have an immunomodulating effect. These effects are mediated via opioid receptors both on immune effector cells and in the central nervous system. Opioids have been demonstrated to have adverse effects on antimicrobial response and anti-tumour surveillance. Buprenorphine has been demonstrated to have no impact on immune function.  Opioid Induced Hyperalgesia: Human studies have demonstrated that prolonged use of opioids can lead to a state of abnormal pain sensitivity, sometimes called opioid induced hyperalgesia (OIH). Opioid induced hyperalgesia is not usually seen in the absence of tolerance to opioid analgesia. Clinically, hyperalgesia may be diagnosed if the patient on long-term opioid therapy presents with increased pain. This might be qualitatively and anatomically distinct from pain related to disease progression or to breakthrough pain resulting from development of opioid tolerance. Pain associated with hyperalgesia tends to be more diffuse than the  pre-existing pain and less defined in quality. Management of opioid induced hyperalgesia requires opioid dose reduction.  Cancer: Chronic opioid therapy has been associated with an increased risk of cancer among noncancer patients with chronic pain. This association was more evident in chronic strong opioid users. Chronic opioid consumption causes significant pathological changes in the small intestine and colon. Epidemiological studies have found that there is a link between opium dependence and initiation of gastrointestinal cancers. Cancer is the second leading cause of death after cardiovascular disease. Chronic use of opioids can cause multiple conditions such as GERD, immunosuppression and renal damage as well as carcinogenic effects, which are associated with the incidence of cancers.   Mortality: Long-term opioid use   has been associated with increased mortality among patients with chronic non-cancer pain (CNCP).  Prescription of long-acting opioids for chronic noncancer pain was associated with a significantly increased risk of all-cause mortality, including deaths from causes other than overdose.  Reference: Von Korff M, Kolodny A, Deyo RA, Chou R. Long-term opioid therapy reconsidered. Ann Intern Med. 2011 Sep 6;155(5):325-8. doi: 10.7326/[id number]. PMID: 21893626; PMCID: PMC3280085. Bedson J, Chen Y, Ashworth J, Hayward RA, Dunn KM, Jordan KP. Risk of adverse events in patients prescribed long-term opioids: A cohort study in the [place] Clinical Practice Research Datalink. Eur J Pain. 2019 May;23(5):908-922. doi: 10.1002/ejp.1357. Epub 2019 Jan 31. PMID: 30620116. Colameco S, Coren JS, Ciervo CA. Continuous opioid treatment for chronic noncancer pain: a time for moderation in prescribing. Postgrad Med. 2009 Jul;121(4):61-6. doi: 10.3810/pgm.2009.07.2032. PMID: 19641271. Chou R, Turner JA, Devine EB, Hansen RN, Sullivan SD, Blazina I, Dana T, Bougatsos C, Deyo RA. The  effectiveness and risks of long-term opioid therapy for chronic pain: a systematic review for a National Institutes of Health Pathways to Prevention Workshop. Ann Intern Med. 2015 Feb 17;162(4):276-86. doi: 10.7326/M14-2559. PMID: 25581257. Warner M, Chen LH, Makuc DM. NCHS Data Brief No. 22. [place]: Centers for Disease Control and Prevention; 2009. Sep, Increase in Fatal Poisonings Involving Opioid Analgesics in the [place], 1999-2006. Song IA, Choi HR, Oh TK. Long-term opioid use and mortality in patients with chronic non-cancer pain: Ten-year follow-up study in [place] from 2010 through 2019. EClinicalMedicine. 2022 Jul 18;51:101558. doi: 10.1016/j.eclinm.2022.101558. PMID: 35875817; PMCID: PMC9304910. Huser, W., Schubert, T., Vogelmann, T. et al. All-cause mortality in patients with long-term opioid therapy compared with non-opioid analgesics for chronic non-cancer pain: a database study. BMC Med 18, 162 (2020). https://doi.org/10.1186/[email or web address] Rashidian H, Zendehdel K, Kamangar F, Malekzadeh R, Haghdoost AA. An Ecological Study of the Association between Opiate Use and Incidence of Cancers. Addict Health. 2016 Fall;8(4):252-260. PMID: 28819556; PMCID: PMC5554805.  Our Goal: Our goal is to control your pain with means other than the use of opioid pain medications.  Our Recommendation: Talk to your physician about coming off of these medications. We can assist you with the tapering down and stopping these medicines. Based on the new information, even if you cannot completely stop the medication, a decrease in the dose may be associated with a lesser risk. Ask for other means of controlling the pain. Decrease or eliminate those factors that significantly contribute to your pain such as smoking, obesity, and a diet heavily tilted towards "inflammatory" nutrients.  Last Updated: 05/25/2022    ____________________________________________________________________________________________     ____________________________________________________________________________________________  Patient Information update  To: All of our patients.  Re: Name change.  It has been made official that our current name, "[facility]"   will soon be changed to "[facility]".   The purpose of this change is to eliminate any confusion created by the concept of our practice being a "Medication Management Pain Clinic". In the past this has led to the misconception that we treat pain primarily by the use of prescription medications.  Nothing can be farther from the truth.   Understanding PAIN MANAGEMENT: To further understand what our practice does, you first have to understand that "Pain Management" is a subspecialty that requires additional training once a physician has completed their specialty training, which can be in either Anesthesia, Neurology, Psychiatry, or Physical Medicine and Rehabilitation (PMR). Each one of these contributes to the final approach taken by each physician to   the management of their patient's pain. To be a "Pain Management Specialist" you must have first completed one of the specialty trainings below.  Anesthesiologists - trained in clinical pharmacology and interventional techniques such as nerve blockade and regional as well as central neuroanatomy. They are trained to block pain before, during, and after surgical interventions.  Neurologists - trained in the diagnosis and pharmacological treatment of complex neurological conditions, such as Multiple Sclerosis, Parkinson's, spinal cord injuries, and other systemic conditions that may be associated with symptoms that may include but are not limited to pain. They tend to rely primarily on the treatment of chronic pain  using prescription medications.  Psychiatrist - trained in conditions affecting the psychosocial wellbeing of patients including but not limited to depression, anxiety, schizophrenia, personality disorders, addiction, and other substance use disorders that may be associated with chronic pain. They tend to rely primarily on the treatment of chronic pain using prescription medications.   Physical Medicine and Rehabilitation (PMR) physicians, also known as physiatrists - trained to treat a wide variety of medical conditions affecting the brain, spinal cord, nerves, bones, joints, ligaments, muscles, and tendons. Their training is primarily aimed at treating patients that have suffered injuries that have caused severe physical impairment. Their training is primarily aimed at the physical therapy and rehabilitation of those patients. They may also work alongside orthopedic surgeons or neurosurgeons using their expertise in assisting surgical patients to recover after their surgeries.  INTERVENTIONAL PAIN MANAGEMENT is sub-subspecialty of Pain Management.  Our physicians are Board-certified in Anesthesia, Pain Management, and Interventional Pain Management.  This meaning that not only have they been trained and Board-certified in their specialty of Anesthesia, and subspecialty of Pain Management, but they have also received further training in the sub-subspecialty of Interventional Pain Management, in order to become Board-certified as INTERVENTIONAL PAIN MANAGEMENT SPECIALIST.    Mission: Our goal is to use our skills in  INTERVENTIONAL PAIN MANAGEMENT as alternatives to the chronic use of prescription opioid medications for the treatment of pain. To make this more clear, we have changed our name to reflect what we do and offer. We will continue to offer medication management assessment and recommendations, but we will not be taking over any patient's medication  management.  ____________________________________________________________________________________________     ____________________________________________________________________________________________  National Pain Medication Shortage  The [place] is experiencing worsening drug shortages. These have had a negative widespread effect on patient care and treatment. Not expected to improve any time soon. Predicted to last past 2029.   Drug shortage list (generic names) Oxycodone IR Oxycodone/APAP Oxymorphone IR Hydromorphone Hydrocodone/APAP Morphine  Where is the problem?  Manufacturing and supply level.  Will this shortage affect you?  Only if you take any of the above pain medications.  How? You may be unable to fill your prescription.  Your pharmacist may offer a "partial fill" of your prescription. (Warning: Do not accept partial fills.) Prescriptions partially filled cannot be transferred to another pharmacy. Read our Medication Rules and Regulation. Depending on how much medicine you are dependent on, you may experience withdrawals when unable to get the medication.  Recommendations: Consider ending your dependence on opioid pain medications. Ask your pain specialist to assist you with the process. Consider switching to a medication currently not in shortage, such as Buprenorphine. Talk to your pain specialist about this option. Consider decreasing your pain medication requirements by managing tolerance thru "Drug Holidays". This may help minimize withdrawals, should you run out of medicine. Control your pain thru   the use of non-pharmacological interventional therapies.   Your prescriber: Prescribers cannot be blamed for shortages. Medication manufacturing and supply issues cannot be fixed by the prescriber.   NOTE: The prescriber is not responsible for supplying the medication, or solving supply issues. Work with your pharmacist to solve it. The patient is responsible for  the decision to take or continue taking the medication and for identifying and securing a legal supply source. By law, supplying the medication is the job and responsibility of the pharmacy. The prescriber is responsible for the evaluation, monitoring, and prescribing of these medications.   Prescribers will NOT: Re-issue prescriptions that have been partially filled. Re-issue prescriptions already sent to a pharmacy.  Re-send prescriptions to a different pharmacy because yours did not have your medication. Ask pharmacist to order more medicine or transfer the prescription to another pharmacy. (Read below.)  New 2023 regulation: "November 26, 2021 Revised Regulation Allows DEA-Registered Pharmacies to Transfer Electronic Prescriptions at a Patient's Request DEA Headquarters Division - Public Information Office Patients now have the ability to request their electronic prescription be transferred to another pharmacy without having to go back to their practitioner to initiate the request. This revised regulation went into effect on Monday, November 22, 2021.     At a patient's request, a DEA-registered retail pharmacy can now transfer an electronic prescription for a controlled substance (schedules II-V) to another DEA-registered retail pharmacy. Prior to this change, patients would have to go through their practitioner to cancel their prescription and have it re-issued to a different pharmacy. The process was taxing and time consuming for both patients and practitioners.    The Drug Enforcement Administration (DEA) published its intent to revise the process for transferring electronic prescriptions on February 14, 2020.  The final rule was published in the federal register on October 21, 2021 and went into effect 30 days later.  Under the final rule, a prescription can only be transferred once between pharmacies, and only if allowed under existing state or other applicable law. The prescription must  remain in its electronic form; may not be altered in any way; and the transfer must be communicated directly between two licensed pharmacists. It's important to note, any authorized refills transfer with the original prescription, which means the entire prescription will be filled at the same pharmacy".  Reference: https://www.dea.gov/stories/2023/2023-11/2021-09-01/revised-regulation-allows-dea-registered-pharmacies-transfer (DEA website announcement)  https://www.govinfo.gov/content/pkg/FR-2021-10-21/pdf/2023-15847.pdf (Federal Register  Department of Justice)   Federal Register / Vol. 88, No. 143 / Thursday, October 21, 2021 / Rules and Regulations DEPARTMENT OF JUSTICE  Drug Enforcement Administration  21 CFR Part 1306  [Docket No. [id number]]  RIN [id number]-AB64 Transfer of Electronic Prescriptions for Schedules II-V Controlled Substances Between Pharmacies for Initial Filling  ____________________________________________________________________________________________     ____________________________________________________________________________________________  Transfer of Pain Medication between Pharmacies  Re: 2023 DEA Clarification on existing regulation  Published on DEA Website: November 26, 2021  Title: Revised Regulation Allows DEA-Registered Pharmacies to Transfer Electronic Prescriptions at a Patient's Request DEA Headquarters Division - Public Information Office  "Patients now have the ability to request their electronic prescription be transferred to another pharmacy without having to go back to their practitioner to initiate the request. This revised regulation went into effect on Monday, November 22, 2021.     At a patient's request, a DEA-registered retail pharmacy can now transfer an electronic prescription for a controlled substance (schedules II-V) to another DEA-registered retail pharmacy. Prior to this change, patients would have to go through their practitioner to  cancel their prescription   and have it re-issued to a different pharmacy. The process was taxing and time consuming for both patients and practitioners.    The Drug Enforcement Administration (DEA) published its intent to revise the process for transferring electronic prescriptions on February 14, 2020.  The final rule was published in the federal register on October 21, 2021 and went into effect 30 days later.  Under the final rule, a prescription can only be transferred once between pharmacies, and only if allowed under existing state or other applicable law. The prescription must remain in its electronic form; may not be altered in any way; and the transfer must be communicated directly between two licensed pharmacists. It's important to note, any authorized refills transfer with the original prescription, which means the entire prescription will be filled at the same pharmacy."    REFERENCES: 1. DEA website announcement https://www.dea.gov/stories/2023/2023-11/2021-09-01/revised-regulation-allows-dea-registered-pharmacies-transfer  2. Department of Justice website  https://www.govinfo.gov/content/pkg/FR-2021-10-21/pdf/2023-15847.pdf  3. DEPARTMENT OF JUSTICE Drug Enforcement Administration 21 CFR Part 1306 [Docket No. [id number]] RIN [id number]-AB64 "Transfer of Electronic Prescriptions for Schedules II-V Controlled Substances Between Pharmacies for Initial Filling"  ____________________________________________________________________________________________     _______________________________________________________________________  Medication Rules  Purpose: To inform patients, and their family members, of our medication rules and regulations.  Applies to: All patients receiving prescriptions from our practice (written or electronic).  Pharmacy of record: This is the pharmacy where your electronic prescriptions will be sent. Make sure we have the correct one.  Electronic prescriptions: In  compliance with the [place] Strengthen Opioid Misuse Prevention (STOP) Act of 2017 (Session Law 2017-74/H243), effective March 28, 2018, all controlled substances must be electronically prescribed. Written prescriptions, faxing, or calling prescriptions to a pharmacy will no longer be done.  Prescription refills: These will be provided only during in-person appointments. No medications will be renewed without a "face-to-face" evaluation with your provider. Applies to all prescriptions.  NOTE: The following applies primarily to controlled substances (Opioid* Pain Medications).   Type of encounter (visit): For patients receiving controlled substances, face-to-face visits are required. (Not an option and not up to the patient.)  Patient's responsibilities: Pain Pills: Bring all pain pills to every appointment (except for procedure appointments). Pill Bottles: Bring pills in original pharmacy bottle. Bring bottle, even if empty. Always bring the bottle of the most recent fill.  Medication refills: You are responsible for knowing and keeping track of what medications you are taking and when is it that you will need a refill. The day before your appointment: write a list of all prescriptions that need to be refilled. The day of the appointment: give the list to the admitting nurse. Prescriptions will be written only during appointments. No prescriptions will be written on procedure days. If you forget a medication: it will not be "Called in", "Faxed", or "electronically sent". You will need to get another appointment to get these prescribed. No early refills. Do not call asking to have your prescription filled early. Partial  or short prescriptions: Occasionally your pharmacy may not have enough pills to fill your prescription.  NEVER ACCEPT a partial fill or a prescription that is short of the total amount of pills that you were prescribed.  With controlled substances the law allows 72 hours for  the pharmacy to complete the prescription.  If the prescription is not completed within 72 hours, the pharmacist will require a new prescription to be written. This means that you will be short on your medicine and we WILL NOT send another prescription to complete your original   prescription.  Instead, request the pharmacy to send a carrier to a nearby branch to get enough medication to provide you with your full prescription. Prescription Accuracy: You are responsible for carefully inspecting your prescriptions before leaving our office. Have the discharge nurse carefully go over each prescription with you, before taking them home. Make sure that your name is accurately spelled, that your address is correct. Check the name and dose of your medication to make sure it is accurate. Check the number of pills, and the written instructions to make sure they are clear and accurate. Make sure that you are given enough medication to last until your next medication refill appointment. Taking Medication: Take medication as prescribed. When it comes to controlled substances, taking less pills or less frequently than prescribed is permitted and encouraged. Never take more pills than instructed. Never take the medication more frequently than prescribed.  Inform other Doctors: Always inform, all of your healthcare providers, of all the medications you take. Pain Medication from other Providers: You are not allowed to accept any additional pain medication from any other Doctor or Healthcare provider. There are two exceptions to this rule. (see below) In the event that you require additional pain medication, you are responsible for notifying us, as stated below. Cough Medicine: Often these contain an opioid, such as codeine or hydrocodone. Never accept or take cough medicine containing these opioids if you are already taking an opioid* medication. The combination may cause respiratory failure and death. Medication Agreement:  You are responsible for carefully reading and following our Medication Agreement. This must be signed before receiving any prescriptions from our practice. Safely store a copy of your signed Agreement. Violations to the Agreement will result in no further prescriptions. (Additional copies of our Medication Agreement are available upon request.) Laws, Rules, & Regulations: All patients are expected to follow all Federal and State Laws, Statutes, Rules, & Regulations. Ignorance of the Laws does not constitute a valid excuse.  Illegal drugs and Controlled Substances: The use of illegal substances (including, but not limited to marijuana and its derivatives) and/or the illegal use of any controlled substances is strictly prohibited. Violation of this rule may result in the immediate and permanent discontinuation of any and all prescriptions being written by our practice. The use of any illegal substances is prohibited. Adopted CDC guidelines & recommendations: Target dosing levels will be at or below 60 MME/day. Use of benzodiazepines** is not recommended.  Exceptions: There are only two exceptions to the rule of not receiving pain medications from other Healthcare Providers. Exception #1 (Emergencies): In the event of an emergency (i.e.: accident requiring emergency care), you are allowed to receive additional pain medication. However, you are responsible for: As soon as you are able, call our office [phone], at any time of the day or night, and leave a message stating your name, the date and nature of the emergency, and the name and dose of the medication prescribed. In the event that your call is answered by a member of our staff, make sure to document and save the date, time, and the name of the person that took your information.  Exception #2 (Planned Surgery): In the event that you are scheduled by another doctor or dentist to have any type of surgery or procedure, you are allowed (for a period no  longer than 30 days), to receive additional pain medication, for the acute post-op pain. However, in this case, you are responsible for picking up a copy of   our "Post-op Pain Management for Surgeons" handout, and giving it to your surgeon or dentist. This document is available at our office, and does not require an appointment to obtain it. Simply go to our office during business hours (Monday-Thursday from 8:00 AM to 4:00 PM) (Friday 8:00 AM to 12:00 Noon) or if you have a scheduled appointment with us, prior to your surgery, and ask for it by name. In addition, you are responsible for: calling our office [phone], at any time of the day or night, and leaving a message stating your name, name of your surgeon, type of surgery, and date of procedure or surgery. Failure to comply with your responsibilities may result in termination of therapy involving the controlled substances. Medication Agreement Violation. Following the above rules, including your responsibilities will help you in avoiding a Medication Agreement Violation ("Breaking your Pain Medication Contract").  Consequences:  Not following the above rules may result in permanent discontinuation of medication prescription therapy.  *Opioid medications include: morphine, codeine, oxycodone, oxymorphone, hydrocodone, hydromorphone, meperidine, tramadol, tapentadol, buprenorphine, fentanyl, methadone. **Benzodiazepine medications include: diazepam (Valium), alprazolam (Xanax), clonazepam (Klonopine), lorazepam (Ativan), clorazepate (Tranxene), chlordiazepoxide (Librium), estazolam (Prosom), oxazepam (Serax), temazepam (Restoril), triazolam (Halcion) (Last updated: 01/18/2022) ______________________________________________________________________    ______________________________________________________________________  Medication Recommendations and Reminders  Applies to: All patients receiving prescriptions (written and/or  electronic).  Medication Rules & Regulations: You are responsible for reading, knowing, and following our "Medication Rules" document. These exist for your safety and that of others. They are not flexible and neither are we. Dismissing or ignoring them is an act of "non-compliance" that may result in complete and irreversible termination of such medication therapy. For safety reasons, "non-compliance" will not be tolerated. As with the [place] fundamental legal principle of "ignorance of the law is no defense", we will accept no excuses for not having read and knowing the content of documents provided to you by our practice.  Pharmacy of record:  Definition: This is the pharmacy where your electronic prescriptions will be sent.  We do not endorse any particular pharmacy. It is up to you and your insurance to decide what pharmacy to use.  We do not restrict you in your choice of pharmacy. However, once we write for your prescriptions, we will NOT be re-sending more prescriptions to fix restricted supply problems created by your pharmacy, or your insurance.  The pharmacy listed in the electronic medical record should be the one where you want electronic prescriptions to be sent. If you choose to change pharmacy, simply notify our nursing staff. Changes will be made only during your regular appointments and not over the phone.  Recommendations: Keep all of your pain medications in a safe place, under lock and key, even if you live alone. We will NOT replace lost, stolen, or damaged medication. We do not accept "Police Reports" as proof of medications having been stolen. After you fill your prescription, take 1 week's worth of pills and put them away in a safe place. You should keep a separate, properly labeled bottle for this purpose. The remainder should be kept in the original bottle. Use this as your primary supply, until it runs out. Once it's gone, then you know that you have 1 week's worth of medicine,  and it is time to come in for a prescription refill. If you do this correctly, it is unlikely that you will ever run out of medicine. To make sure that the above recommendation works, it is very important that you make   sure your medication refill appointments are scheduled at least 1 week before you run out of medicine. To do this in an effective manner, make sure that you do not leave the office without scheduling your next medication management appointment. Always ask the nursing staff to show you in your prescription , when your medication will be running out. Then arrange for the receptionist to get you a return appointment, at least 7 days before you run out of medicine. Do not wait until you have 1 or 2 pills left, to come in. This is very poor planning and does not take into consideration that we may need to cancel appointments due to bad weather, sickness, or emergencies affecting our staff. DO NOT ACCEPT A "Partial Fill": If for any reason your pharmacy does not have enough pills/tablets to completely fill or refill your prescription, do not allow for a "partial fill". The law allows the pharmacy to complete that prescription within 72 hours, without requiring a new prescription. If they do not fill the rest of your prescription within those 72 hours, you will need a separate prescription to fill the remaining amount, which we will NOT provide. If the reason for the partial fill is your insurance, you will need to talk to the pharmacist about payment alternatives for the remaining tablets, but again, DO NOT ACCEPT A PARTIAL FILL, unless you can trust your pharmacist to obtain the remainder of the pills within 72 hours.  Prescription refills and/or changes in medication(s):  Prescription refills, and/or changes in dose or medication, will be conducted only during scheduled medication management appointments. (Applies to both, written and electronic prescriptions.) No refills on procedure days. No  medication will be changed or started on procedure days. No changes, adjustments, and/or refills will be conducted on a procedure day. Doing so will interfere with the diagnostic portion of the procedure. No phone refills. No medications will be "called into the pharmacy". No Fax refills. No weekend refills. No Holliday refills. No after hours refills.  Remember:  Business hours are:  Monday to Thursday 8:00 AM to 4:00 PM Provider's Schedule: Amardeep Beckers, MD - Appointments are:  Medication management: Monday and Wednesday 8:00 AM to 4:00 PM Procedure day: Tuesday and Thursday 7:30 AM to 4:00 PM Bilal Lateef, MD - Appointments are:  Medication management: Tuesday and Thursday 8:00 AM to 4:00 PM Procedure day: Monday and Wednesday 7:30 AM to 4:00 PM (Last update: 01/18/2022) ______________________________________________________________________    ____________________________________________________________________________________________  Drug Holidays  What is a "Drug Holiday"? Drug Holiday: is the name given to the process of slowly tapering down and temporarily stopping the pain medication for the purpose of decreasing or eliminating tolerance to the drug.  Benefits Improved effectiveness Decreased required effective dose Improved pain control End dependence on high dose therapy Decrease cost of therapy Uncovering "opioid-induced hyperalgesia". (OIH)  What is "opioid hyperalgesia"? It is a paradoxical increase in pain caused by exposure to opioids. Stopping the opioid pain medication, contrary to the expected, it actually decreases or completely eliminates the pain. Ref.: "A comprehensive review of opioid-induced hyperalgesia". Marion Lee, et.al. Pain Physician. 2011 Mar-Apr;14(2):145-61.  What is tolerance? Tolerance: the progressive loss of effectiveness of a pain medicine due to repetitive use. A common problem of opioid pain medications.  How long should a "Drug  Holiday" last? Effectiveness depends on the patient staying off all opioid pain medicines for a minimum of 14 consecutive days. (2 weeks)  How about just taking less of the medicine? Does not   work. Will not accomplish goal of eliminating the excess receptors.  How about switching to a different pain medicine? (AKA. "Opioid rotation") Does not work. Creates the illusion of effectiveness by taking advantage of inaccurate equivalent dose calculations between different opioids. -This "technique" was promoted by studies funded by pharmaceutical companies, such as [employer], creators of "OxyContin".  Can I stop the medicine "cold turkey"? We do not recommend it. You should always coordinate with your prescribing physician to make the transition as smoothly as possible. Avoid stopping the medicine abruptly without consulting. We recommend a "slow taper".  What is a slow taper? Taper: refers to the gradual decrease in dose.   How do I stop/taper the dose? Slowly. Decrease the daily amount of pills that you take by one (1) pill every seven (7) days. This is called a "slow downward taper". Example: if you normally take four (4) pills per day, drop it to three (3) pills per day for seven (7) days, then to two (2) pills per day for seven (7) days, then to one (1) per day for seven (7) days, and then stop the medicine. The 14 day "Drug Holiday" starts on the first day without medicine.   Will I experience withdrawals? Unlikely with a slow taper.  What triggers withdrawals? Withdrawals are triggered by the sudden/abrupt stop of high dose opioids. Withdrawals can be avoided by slowly decreasing the dose over a prolonged period of time.  What are withdrawals? Symptoms associated with sudden/abrupt reduction/stopping of high-dose, long-term use of pain medication. Withdrawal are seldom seen on low dose therapy, or patients rarely taking opioid medication.  Early Withdrawal Symptoms may  include: Agitation Anxiety Muscle aches Increased tearing Insomnia Runny nose Sweating Yawning  Late symptoms may include: Abdominal cramping Diarrhea Dilated pupils Goose bumps Nausea Vomiting  When could I see withdrawals? Onset: 8-24 hours after last use for most opioids. 12-48 hours for long-acting opioids (i.e.: methadone)  How long could they last? Duration: 4-10 days for most opioids. 14-21 days for long-acting opioids (i.e.: methadone)  What will happen after I complete my "Drug Holiday"? The need and indications for the opioid analgesic will be reviewed before restarting the medication. Dose requirements will likely decrease and the dose will need to be adjusted accordingly.   (Last update: 06/15/2022) ____________________________________________________________________________________________    ____________________________________________________________________________________________  WARNING: CBD (cannabidiol) & Delta (Delta-8 tetrahydrocannabinol) products.   Applicable to:  All individuals currently taking or considering taking CBD (cannabidiol) and, more important, all patients taking opioid analgesic controlled substances (pain medication). (Example: oxycodone; oxymorphone; hydrocodone; hydromorphone; morphine; methadone; tramadol; tapentadol; fentanyl; buprenorphine; butorphanol; dextromethorphan; meperidine; codeine; etc.)  Introduction:  Recently there has been a drive towards the use of "natural" products for the treatment of different conditions, including pain anxiety and sleep disorders. Marijuana and hemp are two varieties of the cannabis genus plants. Marijuana and its derivatives are illegal, while hemp and its derivatives are not. Cannabidiol (CBD) and tetrahydrocannabinol (THC), are two natural compounds found in plants of the Cannabis genus. They can both be extracted from hemp or marijuana. Both compounds interact with your body's endocannabinoid  system in very different ways. CBD is associated with pain relief (analgesia) while THC is associated with the psychoactive effects ("the high") obtained from the use of marijuana products. There are two main types of THC: Delta-9, which comes from the marijuana plant and it is illegal, and Delta-8, which comes from the hemp plant, and it is legal. (Both, Delta-9-THC and Delta-8-THC are psychoactive and   give you "the high".)   Legality:  Marijuana and its derivatives: illegal Hemp and its derivatives: Legal (State dependent) UPDATE: (05/14/2021) The Drug Enforcement Agency (DEA) issued a letter stating that "delta" cannabinoids, including Delta-8-THCO and Delta-9-THCO, synthetically derived from hemp do not qualify as hemp and will be viewed as Schedule I drugs. (Schedule I drugs, substances, or chemicals are defined as drugs with no currently accepted medical use and a high potential for abuse. Some examples of Schedule I drugs are: heroin, lysergic acid diethylamide (LSD), marijuana (cannabis), 3,4-methylenedioxymethamphetamine (ecstasy), methaqualone, and peyote.) (https://www.dea.gov)  Legal status of CBD in [place]:  "Conditionally Legal"  Reference: "FDA Regulation of Cannabis and Cannabis-Derived Products, Including Cannabidiol (CBD)" - https://www.fda.gov/news-events/public-health-focus/fda-regulation-cannabis-and-cannabis-derived-products-including-cannabidiol-cbd  Warning:  CBD is not FDA approved and has not undergo the same manufacturing controls as prescription drugs.  This means that the purity and safety of available CBD may be questionable. Most of the time, despite manufacturer's claims, it is contaminated with THC (delta-9-tetrahydrocannabinol - the chemical in marijuana responsible for the "HIGH").  When this is the case, the THC contaminant will trigger a positive urine drug screen (UDS) test for Marijuana (carboxy-THC).   The FDA recently put out a warning about 5 things that everyone  should be aware of regarding Delta-8 THC: Delta-8 THC products have not been evaluated or approved by the FDA for safe use and may be marketed in ways that put the public health at risk. The FDA has received adverse event reports involving delta-8 THC-containing products. Delta-8 THC has psychoactive and intoxicating effects. Delta-8 THC manufacturing often involve use of potentially harmful chemicals to create the concentrations of delta-8 THC claimed in the marketplace. The final delta-8 THC product may have potentially harmful by-products (contaminants) due to the chemicals used in the process. Manufacturing of delta-8 THC products may occur in uncontrolled or unsanitary settings, which may lead to the presence of unsafe contaminants or other potentially harmful substances. Delta-8 THC products should be kept out of the reach of children and pets.  NOTE: Because a positive UDS for any illicit substance is a violation of our medication agreement, your opioid analgesics (pain medicine) may be permanently discontinued.  MORE ABOUT CBD  General Information: CBD was discovered in 1940 and it is a derivative of the cannabis sativa genus plants (Marijuana and Hemp). It is one of the 113 identified substances found in Marijuana. It accounts for up to 40% of the plant's extract. As of 2018, preliminary clinical studies on CBD included research for the treatment of anxiety, movement disorders, and pain. CBD is available and consumed in multiple forms, including inhalation of smoke or vapor, as an aerosol spray, and by mouth. It may be supplied as an oil containing CBD, capsules, dried cannabis, or as a liquid solution. CBD is thought not to be as psychoactive as THC (delta-9-tetrahydrocannabinol - the chemical in marijuana responsible for the "HIGH"). Studies suggest that CBD may interact with different biological target receptors in the body, including cannabinoid and other neurotransmitter receptors. As of  2018 the mechanism of action for its biological effects has not been determined.  Side-effects  Adverse reactions: Dry mouth, diarrhea, decreased appetite, fatigue, drowsiness, malaise, weakness, sleep disturbances, and others.  Drug interactions:  CBD may interact with medications such as blood-thinners. CBD causes drowsiness on its own and it will increase drowsiness caused by other medications, including antihistamines (such as Benadryl), benzodiazepines (Xanax, Ativan, Valium), antipsychotics, antidepressants, opioids, alcohol and supplements such as kava, melatonin and St. John's Wort.    Other drug interactions: Brivaracetam (Briviact); Caffeine; Carbamazepine (Tegretol); Citalopram (Celexa); Clobazam (Onfi); Eslicarbazepine (Aptiom); Everolimus (Zostress); Lithium; Methadone (Dolophine); Rufinamide (Banzel); Sedative medications (CNS depressants); Sirolimus (Rapamune); Stiripentol (Diacomit); Tacrolimus (Prograf); Tamoxifen ; Soltamox); Topiramate (Topamax); Valproate; Warfarin (Coumadin); Zonisamide. (Last update: 03/07/2022) ____________________________________________________________________________________________   ____________________________________________________________________________________________  Naloxone Nasal Spray  Why am I receiving this medication? [place] STOP ACT requires that all patients taking high dose opioids or at risk of opioids respiratory depression, be prescribed an opioid reversal agent, such as Naloxone (AKA: Narcan).  What is this medication? NALOXONE (nal OX one) treats opioid overdose, which causes slow or shallow breathing, severe drowsiness, or trouble staying awake. Call emergency services after using this medication. You may need additional treatment. Naloxone works by reversing the effects of opioids. It belongs to a group of medications called opioid blockers.  COMMON BRAND NAME(S): Kloxxado, Narcan  What should I tell my care team before  I take this medication? They need to know if you have any of these conditions: Heart disease Substance use disorder An unusual or allergic reaction to naloxone, other medications, foods, dyes, or preservatives Pregnant or trying to get pregnant Breast-feeding  When to use this medication? This medication is to be used for the treatment of respiratory depression (less than 8 breaths per minute) secondary to opioid overdose.   How to use this medication? This medication is for use in the nose. Lay the person on their back. Support their neck with your hand and allow the head to tilt back before giving the medication. The nasal spray should be given into 1 nostril. After giving the medication, move the person onto their side. Do not remove or test the nasal spray until ready to use. Get emergency medical help right away after giving the first dose of this medication, even if the person wakes up. You should be familiar with how to recognize the signs and symptoms of a narcotic overdose. If more doses are needed, give the additional dose in the other nostril. Talk to your care team about the use of this medication in children. While this medication may be prescribed for children as young as newborns for selected conditions, precautions do apply.  Naloxone Overdosage: If you think you have taken too much of this medicine contact a poison control center or emergency room at once.  NOTE: This medicine is only for you. Do not share this medicine with others.  What if I miss a dose? This does not apply.  What may interact with this medication? This is only used during an emergency. No interactions are expected during emergency use. This list may not describe all possible interactions. Give your health care provider a list of all the medicines, herbs, non-prescription drugs, or dietary supplements you use. Also tell them if you smoke, drink alcohol, or use illegal drugs. Some items may interact with  your medicine.  What should I watch for while using this medication? Keep this medication ready for use in the case of an opioid overdose. Make sure that you have the phone number of your care team and local hospital ready. You may need to have additional doses of this medication. Each nasal spray contains a single dose. Some emergencies may require additional doses. After use, bring the treated person to the nearest hospital or call 911. Make sure the treating care team knows that the person has received a dose of this medication. You will receive additional instructions on what to do during and after use of this   medication before an emergency occurs.  What side effects may I notice from receiving this medication? Side effects that you should report to your care team as soon as possible: Allergic reactions--skin rash, itching, hives, swelling of the face, lips, tongue, or throat Side effects that usually do not require medical attention (report these to your care team if they continue or are bothersome): Constipation Dryness or irritation inside the nose Headache Increase in blood pressure Muscle spasms Stuffy nose Toothache This list may not describe all possible side effects. Call your doctor for medical advice about side effects. You may report side effects to FDA at 1-800-FDA-1088.  Where should I keep my medication? Because this is an emergency medication, you should keep it with you at all times.  Keep out of the reach of children and pets. Store between 20 and 25 degrees C (68 and 77 degrees F). Do not freeze. Throw away any unused medication after the expiration date. Keep in original box until ready to use.  NOTE: This sheet is a summary. It may not cover all possible information. If you have questions about this medicine, talk to your doctor, pharmacist, or health care provider.   2023 Elsevier/Gold Standard (2020-11-20  00:00:00)  ____________________________________________________________________________________________   

## 2022-08-08 NOTE — Progress Notes (Signed)
Safety precautions to be maintained throughout the outpatient stay will include: orient to surroundings, keep bed in low position, maintain call bell within reach at all times, provide assistance with transfer out of bed and ambulation.   Nursing Pain Medication Assessment:  Safety precautions to be maintained throughout the outpatient stay will include: orient to surroundings, keep bed in low position, maintain call bell within reach at all times, provide assistance with transfer out of bed and ambulation.  Medication Inspection Compliance: Pill count conducted under aseptic conditions, in front of the patient. Neither the pills nor the bottle was removed from the patient's sight at any time. Once count was completed pills were immediately returned to the patient in their original bottle.  Medication #1: Oxycodone IR Pill/Patch Count:  88 of 120 pills remain Pill/Patch Appearance: Markings consistent with prescribed medication Bottle Appearance: Standard pharmacy container. Clearly labeled. Filled Date: 05 / 04 / 2024 Last Medication intake:  Today  Medication #2: Oxycodone ER (OxyContin) Pill/Patch Count:  43 of 60 pills remain Pill/Patch Appearance: Markings consistent with prescribed medication Bottle Appearance: Standard pharmacy container. Clearly labeled. Filled Date: 05 / 04 / 2024 Last Medication intake:  Today

## 2022-08-11 LAB — TOXASSURE SELECT 13 (MW), URINE

## 2022-08-24 ENCOUNTER — Encounter: Payer: BC Managed Care – PPO | Admitting: Pain Medicine

## 2022-09-26 DIAGNOSIS — H0102A Squamous blepharitis right eye, upper and lower eyelids: Secondary | ICD-10-CM | POA: Diagnosis not present

## 2022-09-26 DIAGNOSIS — H401131 Primary open-angle glaucoma, bilateral, mild stage: Secondary | ICD-10-CM | POA: Diagnosis not present

## 2022-09-26 DIAGNOSIS — H0102B Squamous blepharitis left eye, upper and lower eyelids: Secondary | ICD-10-CM | POA: Diagnosis not present

## 2022-09-26 DIAGNOSIS — H25813 Combined forms of age-related cataract, bilateral: Secondary | ICD-10-CM | POA: Diagnosis not present

## 2022-11-21 ENCOUNTER — Encounter: Payer: BC Managed Care – PPO | Admitting: Pain Medicine

## 2022-11-22 NOTE — Progress Notes (Unsigned)
PROVIDER NOTE: Information contained herein reflects review and annotations entered in association with encounter. Interpretation of such information and data should be left to medically-trained personnel. Information provided to patient can be located elsewhere in the medical record under "Patient Instructions". Document created using STT-dictation technology, any transcriptional errors that may result from process are unintentional.    Patient: Lance Roman  Service Category: E/M  Provider: Oswaldo Done, MD  DOB: 04/12/1960  DOS: 11/23/2022  Referring Provider: Myrlene Broker, *  MRN: [id number]  Specialty: Interventional Pain Management  PCP: Myrlene Broker, MD  Type: Established Patient  Setting: Ambulatory outpatient    Location: Office  Delivery: Face-to-face     HPI  [name], a 62 y.o. year old male, is here today because of his No primary diagnosis found.. Lance Roman primary complain today is No chief complaint on file.  Pertinent problems: [name] has Chronic low back pain (1ry area of Pain) (Bilateral) (L>R) w/o sciatica; Chronic pain syndrome; Chronic lower extremity pain (2ry area of Pain) (Left); Chronic lumbar radicular pain (L5) (Left); Retrolisthesis (4-5 mm) of L2/L3 and L3/L4; Failed back surgical syndrome (Fusion L4-S1); Lumbar facet syndrome (Bilateral) (L>R); Spinal cord stimulator dysfunction (fractured leads); Lumbar facet osteoarthritis (Bilateral); Spondylosis without myelopathy or radiculopathy, lumbosacral region; Other specified dorsopathies, sacral and sacrococcygeal region; Chronic hip pain (Left); Chronic knee pain (Left); DDD (degenerative disc disease), lumbosacral; Neurogenic pain; Chronic upper back pain; Chronic musculoskeletal pain; Epidural fibrosis; Abnormal CT/myelogram scan, lumbar spine (08/28/2017); Myofascial pain syndrome of thoracic spine; and Chronic midline thoracic back pain on their pertinent problem list. Pain  Assessment: Severity of   is reported as a  /10. Location:    / . Onset:  . Quality:  . Timing:  . Modifying factor(s):  Marland Kitchen Vitals:  vitals were not taken for this visit.  BMI: Estimated body mass index is 33.86 kg/m as calculated from the following:   Height as of 08/08/22: 5\' 10"  (1.778 m).   Weight as of 08/08/22: 236 lb (107 kg). Last encounter: 08/08/2022. Last procedure: 03/29/2022.  Reason for encounter:  *** . ***  Pharmacotherapy Assessment  Analgesic: Oxycodone IR 5 mg, 1 tab PO q 6 hrs (20 mg/day of oxycodone) + OxyContin 10 mg, 1 tab p.o. twice daily (20 mg/day of oxycodone) (total of 40 mg/day of oxycodone) MME/day: 60 mg/day.   Monitoring: [place] PMP: PDMP reviewed during this encounter.       Pharmacotherapy: No side-effects or adverse reactions reported. Compliance: No problems identified. Effectiveness: Clinically acceptable.  No notes on file  No results found for: "CBDTHCR" No results found for: "D8THCCBX" No results found for: "D9THCCBX"  UDS:  Summary  Date Value Ref Range Status  08/08/2022 Note  Final    Comment:    ==================================================================== [employer] [facility] (MW) ==================================================================== Test                             Result       Flag       Units  Drug Present and Declared for Prescription Verification   Oxycodone                      3626         EXPECTED   ng/mg creat   Oxymorphone                    1658  EXPECTED   ng/mg creat   Noroxycodone                   8105         EXPECTED   ng/mg creat   Noroxymorphone                 389          EXPECTED   ng/mg creat    Sources of oxycodone are scheduled prescription medications.    Oxymorphone, noroxycodone, and noroxymorphone are expected    metabolites of oxycodone. Oxymorphone is also available as a    scheduled prescription medication.  Drug Present not Declared for Prescription Verification   Hydrocodone                     418          UNEXPECTED ng/mg creat   Dihydrocodeine                 80           UNEXPECTED ng/mg creat   Norhydrocodone                 436          UNEXPECTED ng/mg creat    Sources of hydrocodone include scheduled prescription medications.    Dihydrocodeine and norhydrocodone are expected metabolites of    hydrocodone. Dihydrocodeine is also available as a scheduled    prescription medication.  ==================================================================== Test                      Result    Flag   Units      Ref Range   Creatinine              99               mg/dL      >=16 ==================================================================== Declared Medications:  The flagging and interpretation on this report are based on the  following declared medications.  Unexpected results may arise from  inaccuracies in the declared medications.   **Note: The testing scope of this panel includes these medications:   Oxycodone (Oxycontin)   **Note: The testing scope of this panel does not include the  following reported medications:   Baclofen (Lioresal)  Esomeprazole (Nexium)  Gabapentin (Neurontin)  Ibuprofen (Advil)  Latanoprost (Xalatan)  Naloxone (Narcan) ==================================================================== For clinical consultation, please call [phone]. ====================================================================       ROS  Constitutional: Denies any fever or chills Gastrointestinal: No reported hemesis, hematochezia, vomiting, or acute GI distress Musculoskeletal: Denies any acute onset joint swelling, redness, loss of ROM, or weakness Neurological: No reported episodes of acute onset apraxia, aphasia, dysarthria, agnosia, amnesia, paralysis, loss of coordination, or loss of consciousness  Medication Review  baclofen, esomeprazole, gabapentin, ibuprofen, latanoprost, naloxone, and oxyCODONE  History Review   Allergy: [name] has No Known Allergies. Drug: [name]  reports no history of drug use. Alcohol:  reports current alcohol use of about 3.0 standard drinks of alcohol per week. Tobacco:  reports that he has quit smoking. His smokeless tobacco use includes chew. Social: Mr. Kuehler  reports that he has quit smoking. His smokeless tobacco use includes chew. He reports current alcohol use of about 3.0 standard drinks of alcohol per week. He reports that he does not use drugs. Medical:  has a past medical history of Acute postoperative pain (11/21/2017), Allergic rhinitis, Chronic pain syndrome,  DDD (degenerative disc disease), lumbosacral, GERD (gastroesophageal reflux disease), Hyperlipidemia, Osteoarthritis, and S/P appendectomy. Surgical: Mr. Housden  has a past surgical history that includes Appendectomy; repairs of bilateral ankle fractures; Spinal fusion (N/A); and Spinal cord stimulator implant (N/A). Family: family history includes Diabetes in his maternal grandmother; Heart disease in his father.  Laboratory Chemistry Profile   Renal Lab Results  Component Value Date   BUN 24 (H) 04/29/2022   CREATININE 0.77 04/29/2022   BCR 27 (H) 12/12/2016   GFR 97.35 04/29/2022   GFRAA 120 12/12/2016   GFRNONAA 104 12/12/2016    Hepatic Lab Results  Component Value Date   AST 19 04/29/2022   ALT 19 04/29/2022   ALBUMIN 4.3 04/29/2022   ALKPHOS 90 04/29/2022   HCVAB NEGATIVE 12/30/2014    Electrolytes Lab Results  Component Value Date   NA 140 04/29/2022   K 3.8 04/29/2022   CL 104 04/29/2022   CALCIUM 9.3 04/29/2022   MG 1.9 12/12/2016    Bone Lab Results  Component Value Date   25OHVITD1 44 12/12/2016   25OHVITD2 <1.0 12/12/2016   25OHVITD3 43 12/12/2016    Inflammation (CRP: Acute Phase) (ESR: Chronic Phase) Lab Results  Component Value Date   CRP 2.5 12/12/2016   ESRSEDRATE 10 12/12/2016         Note: Above Lab results reviewed.  Recent Imaging Review  DG PAIN  CLINIC C-ARM 1-60 MIN NO REPORT Fluoro was used, but no Radiologist interpretation will be provided.  Please refer to "NOTES" tab for provider progress note. Note: Reviewed        Physical Exam  General appearance: Well nourished, well developed, and well hydrated. In no apparent acute distress Mental status: Alert, oriented x 3 (person, place, & time)       Respiratory: No evidence of acute respiratory distress Eyes: PERLA Vitals: There were no vitals taken for this visit. BMI: Estimated body mass index is 33.86 kg/m as calculated from the following:   Height as of 08/08/22: 5\' 10"  (1.778 m).   Weight as of 08/08/22: 236 lb (107 kg). Ideal: Patient weight not recorded  Assessment   Diagnosis Status  No diagnosis found. Controlled Controlled Controlled   Updated Problems: No problems updated.  Plan of Care  Problem-specific:  No problem-specific Assessment & Plan notes found for this encounter.  Mr. CHUKWUKA DWORAK has a current medication list which includes the following long-term medication(s): baclofen, esomeprazole, gabapentin, oxycodone, oxycodone, oxycodone, oxycodone, oxycodone, oxycodone, oxycodone, and oxycodone.  Pharmacotherapy (Medications Ordered): No orders of the defined types were placed in this encounter.  Orders:  No orders of the defined types were placed in this encounter.  Follow-up plan:   No follow-ups on file.      Interventional Therapies  Risk  Complexity Considerations:     NOTE: Poor candidate for RFA 2ry to Hardware   Planned  Pending:      Under consideration:   Diagnostic/therapeutic midline thoracic TPI/MNB #1  Therapeutic left caudal ESI #2  Diagnostic/therapeutic left L2-3 LESI #1  Diagnostic bilateral thoracolumbar facet MBB (L1-2 through L3-4) #1  Diagnostic/therapeutic bilateral L1-2 and L2-3 transforaminal ESI #1  Possible Racz procedure #1    Completed:   Diagnostic/therapeutic bilateral lumbar facet MBB x3  (04/27/2021) (100/100/50/70)  Palliative right lumbar facet RFA x2 (03/29/2022) (100/100/50/50)  Palliative left lumbar facet RFA x2 (03/03/2022) (100/100/75/75) w/ improved function and ROM Therapeutic left caudal ESI x1 (05/14/2019) (100/100/50/100) (100% relief of LEP as of 05/26/2021) (743  days) Therapeutic bilateral erector spinae & quadratus lumborum muscle MNB/TPI x2 (04/10/2018) (100/100/0/0)   Therapeutic  Palliative (PRN) options:   Therapeutic/palliative bilateral lumbar facet MBB    Pharmacotherapy  Nonopioids transferred 02/05/2020: Baclofen        Recent Visits No visits were found meeting these conditions. Showing recent visits within past 90 days and meeting all other requirements Future Appointments Date Type Provider Dept  11/23/22 Appointment Delano Metz, MD [facility]  Showing future appointments within next 90 days and meeting all other requirements  I discussed the assessment and treatment plan with the patient. The patient was provided an opportunity to ask questions and all were answered. The patient agreed with the plan and demonstrated an understanding of the instructions.  Patient advised to call back or seek an in-person evaluation if the symptoms or condition worsens.  Duration of encounter: *** minutes.  Total time on encounter, as per AMA guidelines included both the face-to-face and non-face-to-face time personally spent by the physician and/or other qualified health care professional(s) on the day of the encounter (includes time in activities that require the physician or other qualified health care professional and does not include time in activities normally performed by clinical staff). Physician's time may include the following activities when performed: Preparing to see the patient (e.g., pre-charting review of records, searching for previously ordered imaging, lab work, and nerve conduction tests) Review of prior analgesic  pharmacotherapies. Reviewing PMP Interpreting ordered tests (e.g., lab work, imaging, nerve conduction tests) Performing post-procedure evaluations, including interpretation of diagnostic procedures Obtaining and/or reviewing separately obtained history Performing a medically appropriate examination and/or evaluation Counseling and educating the patient/family/caregiver Ordering medications, tests, or procedures Referring and communicating with other health care professionals (when not separately reported) Documenting clinical information in the electronic or other health record Independently interpreting results (not separately reported) and communicating results to the patient/ family/caregiver Care coordination (not separately reported)  Note by: Oswaldo Done, MD Date: 11/23/2022; Time: 7:47 AM

## 2022-11-23 ENCOUNTER — Encounter: Payer: Self-pay | Admitting: Pain Medicine

## 2022-11-23 ENCOUNTER — Ambulatory Visit: Payer: BC Managed Care – PPO | Attending: Pain Medicine | Admitting: Pain Medicine

## 2022-11-23 VITALS — BP 136/73 | HR 62 | Temp 98.2°F | Resp 16 | Ht 70.0 in | Wt 235.0 lb

## 2022-11-23 DIAGNOSIS — Z79891 Long term (current) use of opiate analgesic: Secondary | ICD-10-CM | POA: Diagnosis not present

## 2022-11-23 DIAGNOSIS — M47816 Spondylosis without myelopathy or radiculopathy, lumbar region: Secondary | ICD-10-CM | POA: Diagnosis not present

## 2022-11-23 DIAGNOSIS — Z79899 Other long term (current) drug therapy: Secondary | ICD-10-CM | POA: Diagnosis not present

## 2022-11-23 DIAGNOSIS — M961 Postlaminectomy syndrome, not elsewhere classified: Secondary | ICD-10-CM | POA: Diagnosis not present

## 2022-11-23 DIAGNOSIS — G894 Chronic pain syndrome: Secondary | ICD-10-CM | POA: Insufficient documentation

## 2022-11-23 DIAGNOSIS — M549 Dorsalgia, unspecified: Secondary | ICD-10-CM | POA: Diagnosis not present

## 2022-11-23 DIAGNOSIS — M79605 Pain in left leg: Secondary | ICD-10-CM | POA: Insufficient documentation

## 2022-11-23 DIAGNOSIS — G8929 Other chronic pain: Secondary | ICD-10-CM | POA: Diagnosis not present

## 2022-11-23 DIAGNOSIS — M7918 Myalgia, other site: Secondary | ICD-10-CM | POA: Diagnosis not present

## 2022-11-23 DIAGNOSIS — M545 Low back pain, unspecified: Secondary | ICD-10-CM | POA: Insufficient documentation

## 2022-11-23 MED ORDER — OXYCODONE HCL ER 10 MG PO T12A: 10.0000 mg | EXTENDED_RELEASE_TABLET | Freq: Two times a day (BID) | ORAL | 0 refills | Status: DC

## 2022-11-23 MED ORDER — OXYCODONE HCL ER 10 MG PO T12A
10.0000 mg | EXTENDED_RELEASE_TABLET | Freq: Two times a day (BID) | ORAL | 0 refills | Status: DC
Start: 2022-11-28 — End: 2023-02-21

## 2022-11-23 MED ORDER — OXYCODONE HCL 5 MG PO TABS
5.0000 mg | ORAL_TABLET | Freq: Four times a day (QID) | ORAL | 0 refills | Status: DC | PRN
Start: 2022-12-28 — End: 2023-02-21

## 2022-11-23 MED ORDER — NALOXONE HCL 4 MG/0.1ML NA LIQD: 1.0000 | NASAL | 0 refills | Status: DC | PRN

## 2022-11-23 MED ORDER — OXYCODONE HCL ER 10 MG PO T12A
10.0000 mg | EXTENDED_RELEASE_TABLET | Freq: Two times a day (BID) | ORAL | 0 refills | Status: DC
Start: 2023-01-27 — End: 2023-03-01

## 2022-11-23 MED ORDER — OXYCODONE HCL 5 MG PO TABS: 5.0000 mg | ORAL_TABLET | Freq: Four times a day (QID) | ORAL | 0 refills | Status: DC | PRN

## 2022-11-23 MED ORDER — OXYCODONE HCL 5 MG PO TABS
5.0000 mg | ORAL_TABLET | Freq: Four times a day (QID) | ORAL | 0 refills | Status: DC | PRN
Start: 2022-11-28 — End: 2023-02-21

## 2022-11-23 NOTE — Progress Notes (Signed)
Nursing Pain Medication Assessment:  Safety precautions to be maintained throughout the outpatient stay will include: orient to surroundings, keep bed in low position, maintain call bell within reach at all times, provide assistance with transfer out of bed and ambulation.  Medication Inspection Compliance: Pill count conducted under aseptic conditions, in front of the patient. Neither the pills nor the bottle was removed from the patient's sight at any time. Once count was completed pills were immediately returned to the patient in their original bottle.  Medication #1: Oxycotin 10mg  Pill/Patch Count:  8 of 60 pills remain Pill/Patch Appearance: Markings consistent with prescribed medication Bottle Appearance: Standard pharmacy container. Clearly labeled. Filled Date: 08 / 03 / 2024 Last Medication intake:  Today  Medication #2: Oxycodone IR Pill/Patch Count:  24 of 120 pills remain Pill/Patch Appearance: Markings consistent with prescribed medication Bottle Appearance: Standard pharmacy container. Clearly labeled. Filled Date: 08 / 03 / 2024 Last Medication intake:  Today

## 2022-11-23 NOTE — Patient Instructions (Signed)
 ____________________________________________________________________________________________  Opioid Pain Medication Update  To: All patients taking opioid pain medications. (I.e.: hydrocodone, hydromorphone, oxycodone, oxymorphone, morphine, codeine, methadone, tapentadol, tramadol, buprenorphine, fentanyl, etc.)  Re: Updated review of side effects and adverse reactions of opioid analgesics, as well as new information about long term effects of this class of medications.  Direct risks of long-term opioid therapy are not limited to opioid addiction and overdose. Potential medical risks include serious fractures, breathing problems during sleep, hyperalgesia, immunosuppression, chronic constipation, bowel obstruction, myocardial infarction, and tooth decay secondary to xerostomia.  Unpredictable adverse effects that can occur even if you take your medication correctly: Cognitive impairment, respiratory depression, and death. Most people think that if they take their medication "correctly", and "as instructed", that they will be safe. Nothing could be farther from the truth. In reality, a significant amount of recorded deaths associated with the use of opioids has occurred in individuals that had taken the medication for a long time, and were taking their medication correctly. The following are examples of how this can happen: Patient taking his/her medication for a long time, as instructed, without any side effects, is given a certain antibiotic or another unrelated medication, which in turn triggers a "Drug-to-drug interaction" leading to disorientation, cognitive impairment, impaired reflexes, respiratory depression or an untoward event leading to serious bodily harm or injury, including death.  Patient taking his/her medication for a long time, as instructed, without any side effects, develops an acute impairment of liver and/or kidney function. This will lead to a rapid inability of the body to  breakdown and eliminate their pain medication, which will result in effects similar to an "overdose", but with the same medicine and dose that they had always taken. This again may lead to disorientation, cognitive impairment, impaired reflexes, respiratory depression or an untoward event leading to serious bodily harm or injury, including death.  A similar problem will occur with patients as they grow older and their liver and kidney function begins to decrease as part of the aging process.  Background information: Historically, the original case for using long-term opioid therapy to treat chronic noncancer pain was based on safety assumptions that subsequent experience has called into question. In 1996, the American Pain Society and the American Academy of Pain Medicine issued a consensus statement supporting long-term opioid therapy. This statement acknowledged the dangers of opioid prescribing but concluded that the risk for addiction was low; respiratory depression induced by opioids was short-lived, occurred mainly in opioid-naive patients, and was antagonized by pain; tolerance was not a common problem; and efforts to control diversion should not constrain opioid prescribing. This has now proven to be wrong. Experience regarding the risks for opioid addiction, misuse, and overdose in community practice has failed to support these assumptions.  According to the Centers for Disease Control and Prevention, fatal overdoses involving opioid analgesics have increased sharply over the past decade. Currently, more than 96,700 people die from drug overdoses every year. Opioids are a factor in 7 out of every 10 overdose deaths. Deaths from drug overdose have surpassed motor vehicle accidents as the leading cause of death for individuals between the ages of 80 and 61.  Clinical data suggest that neuroendocrine dysfunction may be very common in both men and women, potentially causing hypogonadism, erectile  dysfunction, infertility, decreased libido, osteoporosis, and depression. Recent studies linked higher opioid dose to increased opioid-related mortality. Controlled observational studies reported that long-term opioid therapy may be associated with increased risk for cardiovascular events. Subsequent meta-analysis concluded  that the safety of long-term opioid therapy in elderly patients has not been proven.   Side Effects and adverse reactions: Common side effects: Drowsiness (sedation). Dizziness. Nausea and vomiting. Constipation. Physical dependence -- Dependence often manifests with withdrawal symptoms when opioids are discontinued or decreased. Tolerance -- As you take repeated doses of opioids, you require increased medication to experience the same effect of pain relief. Respiratory depression -- This can occur in healthy people, especially with higher doses. However, people with COPD, asthma or other lung conditions may be even more susceptible to fatal respiratory impairment.  Uncommon side effects: An increased sensitivity to feeling pain and extreme response to pain (hyperalgesia). Chronic use of opioids can lead to this. Delayed gastric emptying (the process by which the contents of your stomach are moved into your small intestine). Muscle rigidity. Immune system and hormonal dysfunction. Quick, involuntary muscle jerks (myoclonus). Arrhythmia. Itchy skin (pruritus). Dry mouth (xerostomia).  Long-term side effects: Chronic constipation. Sleep-disordered breathing (SDB). Increased risk of bone fractures. Hypothalamic-pituitary-adrenal dysregulation. Increased risk of overdose.  RISKS: Respiratory depression and death: Opioids increase the risk of respiratory depression and death.  Drug-to-drug interactions: Opioids are relatively contraindicated in combination with benzodiazepines, sleep inducers, and other central nervous system depressants. Other classes of medications  (i.e.: certain antibiotics and even over-the-counter medications) may also trigger or induce respiratory depression in some patients.  Medical conditions: Patients with pre-existing respiratory problems are at higher risk of respiratory failure and/or depression when in combination with opioid analgesics. Opioids are relatively contraindicated in some medical conditions such as central sleep apnea.   Fractures and Falls:  Opioids increase the risk and incidence of falls. This is of particular importance in elderly patients.  Endocrine System:  Long-term administration is associated with endocrine abnormalities (endocrinopathies). (Also known as Opioid-induced Endocrinopathy) Influences on both the hypothalamic-pituitary-adrenal axis?and the hypothalamic-pituitary-gonadal axis have been demonstrated with consequent hypogonadism and adrenal insufficiency in both sexes. Hypogonadism and decreased levels of dehydroepiandrosterone sulfate have been reported in men and women. Endocrine effects include: Amenorrhoea in women (abnormal absence of menstruation) Reduced libido in both sexes Decreased sexual function Erectile dysfunction in men Hypogonadisms (decreased testicular function with shrinkage of testicles) Infertility Depression and fatigue Loss of muscle mass Anxiety Depression Immune suppression Hyperalgesia Weight gain Anemia Osteoporosis Patients (particularly women of childbearing age) should avoid opioids. There is insufficient evidence to recommend routine monitoring of asymptomatic patients taking opioids in the long-term for hormonal deficiencies.  Immune System: Human studies have demonstrated that opioids have an immunomodulating effect. These effects are mediated via opioid receptors both on immune effector cells and in the central nervous system. Opioids have been demonstrated to have adverse effects on antimicrobial response and anti-tumour surveillance. Buprenorphine has  been demonstrated to have no impact on immune function.  Opioid Induced Hyperalgesia: Human studies have demonstrated that prolonged use of opioids can lead to a state of abnormal pain sensitivity, sometimes called opioid induced hyperalgesia (OIH). Opioid induced hyperalgesia is not usually seen in the absence of tolerance to opioid analgesia. Clinically, hyperalgesia may be diagnosed if the patient on long-term opioid therapy presents with increased pain. This might be qualitatively and anatomically distinct from pain related to disease progression or to breakthrough pain resulting from development of opioid tolerance. Pain associated with hyperalgesia tends to be more diffuse than the pre-existing pain and less defined in quality. Management of opioid induced hyperalgesia requires opioid dose reduction.  Cancer: Chronic opioid therapy has been associated with an increased risk of cancer  among noncancer patients with chronic pain. This association was more evident in chronic strong opioid users. Chronic opioid consumption causes significant pathological changes in the small intestine and colon. Epidemiological studies have found that there is a link between opium dependence and initiation of gastrointestinal cancers. Cancer is the second leading cause of death after cardiovascular disease. Chronic use of opioids can cause multiple conditions such as GERD, immunosuppression and renal damage as well as carcinogenic effects, which are associated with the incidence of cancers.   Mortality: Long-term opioid use has been associated with increased mortality among patients with chronic non-cancer pain (CNCP).  Prescription of long-acting opioids for chronic noncancer pain was associated with a significantly increased risk of all-cause mortality, including deaths from causes other than overdose.  Reference: Von Korff M, Kolodny A, Deyo RA, Chou R. Long-term opioid therapy reconsidered. Ann Intern Med. 2011  Sep 6;155(5):325-8. doi: 10.7326/[id number]. PMID: 64403474; PMCID: [id number]. Randon Goldsmith, Hayward RA, Dunn KM, [place] KP. Risk of adverse events in patients prescribed long-term opioids: A cohort study in the [place] [facility] Research Datalink. Eur J Pain. 2019 May;23(5):908-922. doi: 10.1002/ejp.1357. Epub 2019 Jan 31. PMID: 43329518. Colameco S, Coren JS, Ciervo CA. Continuous opioid treatment for chronic noncancer pain: a time for moderation in prescribing. Postgrad Med. 2009 Jul;121(4):61-6. doi: 10.3810/pgm.2009.07.2032. PMID: 84166063. William Hamburger RN, [place] SD, Blazina I, Cristopher Peru, Bougatsos C, Deyo RA. The effectiveness and risks of long-term opioid therapy for chronic pain: a systematic review for a [employer] of [employer] Pathways to [employer]. Ann Intern Med. 2015 Feb 17;162(4):276-86. doi: 10.7326/M14-2559. PMID: 01601093. Caryl Bis [facility], Makuc DM. NCHS Data Brief No. 22. [place]: Centers for Disease Control and Prevention; 2009. Sep, Increase in Fatal Poisonings Involving Opioid Analgesics in the [place], 1999-2006. Song IA, Choi HR, Oh TK. Long-term opioid use and mortality in patients with chronic non-cancer pain: Ten-year follow-up study in [place] from 2010 through 2019. EClinicalMedicine. 2022 Jul 18;51:101558. doi: 10.1016/j.eclinm.2022.235573. PMID: 22025427; PMCID: [id number]. Huser, W., Schubert, T., Vogelmann, T. et al. All-cause mortality in patients with long-term opioid therapy compared with non-opioid analgesics for chronic non-cancer pain: a database study. BMC Med 18, 162 (2020). http://lester.info/ Rashidian H, Karie Kirks, Malekzadeh R, Haghdoost AA. An Ecological Study of the Association between Opiate Use and Incidence of Cancers. Addict Health. 2016 Fall;8(4):252-260. PMID: 51761607; PMCID: [id number].  Our Goal: Our goal is to control your  pain with means other than the use of opioid pain medications.  Our Recommendation: Talk to your physician about coming off of these medications. We can assist you with the tapering down and stopping these medicines. Based on the new information, even if you cannot completely stop the medication, a decrease in the dose may be associated with a lesser risk. Ask for other means of controlling the pain. Decrease or eliminate those factors that significantly contribute to your pain such as smoking, obesity, and a diet heavily tilted towards "inflammatory" nutrients.  Last Updated: 10/03/2022   ____________________________________________________________________________________________     ____________________________________________________________________________________________  National Pain Medication Shortage  The [place] is experiencing worsening drug shortages. These have had a negative widespread effect on patient care and treatment. Not expected to improve any time soon. Predicted to last past 2029.   Drug shortage list (generic names) Oxycodone IR Oxycodone/APAP Oxymorphone IR Hydromorphone Hydrocodone/APAP Morphine  Where is the problem?  Manufacturing and supply level.  Will this shortage affect you?  Only if you  take any of the above pain medications.  How? You may be unable to fill your prescription.  Your pharmacist may offer a "partial fill" of your prescription. (Warning: Do not accept partial fills.) Prescriptions partially filled cannot be transferred to another pharmacy. Read our Medication Rules and Regulation. Depending on how much medicine you are dependent on, you may experience withdrawals when unable to get the medication.  Recommendations: Consider ending your dependence on opioid pain medications. Ask your pain specialist to assist you with the process. Consider switching to a medication currently not in shortage, such as Buprenorphine. Talk to your pain  specialist about this option. Consider decreasing your pain medication requirements by managing tolerance thru "Drug Holidays". This may help minimize withdrawals, should you run out of medicine. Control your pain thru the use of non-pharmacological interventional therapies.   Your prescriber: Prescribers cannot be blamed for shortages. Medication manufacturing and supply issues cannot be fixed by the prescriber.   NOTE: The prescriber is not responsible for supplying the medication, or solving supply issues. Work with your pharmacist to solve it. The patient is responsible for the decision to take or continue taking the medication and for identifying and securing a legal supply source. By law, supplying the medication is the job and responsibility of the pharmacy. The prescriber is responsible for the evaluation, monitoring, and prescribing of these medications.   Prescribers will NOT: Re-issue prescriptions that have been partially filled. Re-issue prescriptions already sent to a pharmacy.  Re-send prescriptions to a different pharmacy because yours did not have your medication. Ask pharmacist to order more medicine or transfer the prescription to another pharmacy. (Read below.)  New 2023 regulation: "November 26, 2021 Revised Regulation Allows DEA-Registered Pharmacies to Transfer Electronic Prescriptions at a Patient's Request DEA Headquarters Division - Public Information Office Patients now have the ability to request their electronic prescription be transferred to another pharmacy without having to go back to their practitioner to initiate the request. This revised regulation went into effect on Monday, November 22, 2021.     At a patient's request, a DEA-registered retail pharmacy can now transfer an electronic prescription for a controlled substance (schedules II-V) to another DEA-registered retail pharmacy. Prior to this change, patients would have to go through their practitioner to  cancel their prescription and have it re-issued to a different pharmacy. The process was taxing and time consuming for both patients and practitioners.    The Drug Enforcement Administration [facility]) published its intent to revise the process for transferring electronic prescriptions on February 14, 2020.  The final rule was published in the federal register on October 21, 2021 and went into effect 30 days later.  Under the final rule, a prescription can only be transferred once between pharmacies, and only if allowed under existing state or other applicable law. The prescription must remain in its electronic form; may not be altered in any way; and the transfer must be communicated directly between two licensed pharmacists. It's important to note, any authorized refills transfer with the original prescription, which means the entire prescription will be filled at the same pharmacy".  Reference: HugeHand.is [facility] website announcement)  [email or web address] [employer] of Justice)   [employer] / Vol. 88, No. 143 / Thursday, October 21, 2021 / Rules and Regulations DEPARTMENT OF JUSTICE  Drug Enforcement Administration  21 CFR Part 1306  [Docket No. [id number]]  RIN [id number] Transfer of Electronic Prescriptions for Schedules II-V Controlled Substances Between Pharmacies for Initial Filling  ____________________________________________________________________________________________  ____________________________________________________________________________________________  Transfer of Pain Medication between Pharmacies  Re: 2023 DEA Clarification on existing regulation  Published on DEA Website: November 26, 2021  Title: Revised Regulation Allows DEA-Registered Pharmacies to Electrical engineer Prescriptions at a Patient's  Request DEA Headquarters Division - [employer]  "Patients now have the ability to request their electronic prescription be transferred to another pharmacy without having to go back to their practitioner to initiate the request. This revised regulation went into effect on Monday, November 22, 2021.     At a patient's request, a DEA-registered retail pharmacy can now transfer an electronic prescription for a controlled substance (schedules II-V) to another DEA-registered retail pharmacy. Prior to this change, patients would have to go through their practitioner to cancel their prescription and have it re-issued to a different pharmacy. The process was taxing and time consuming for both patients and practitioners.    The Drug Enforcement Administration [facility]) published its intent to revise the process for transferring electronic prescriptions on February 14, 2020.  The final rule was published in the federal register on October 21, 2021 and went into effect 30 days later.  Under the final rule, a prescription can only be transferred once between pharmacies, and only if allowed under existing state or other applicable law. The prescription must remain in its electronic form; may not be altered in any way; and the transfer must be communicated directly between two licensed pharmacists. It's important to note, any authorized refills transfer with the original prescription, which means the entire prescription will be filled at the same pharmacy."    REFERENCES: 1. DEA website announcement [email or web address]  2. Department of Justice website  [email or web address].pdf  3. DEPARTMENT OF JUSTICE Drug Enforcement Administration 21 CFR Part 1306 [Docket No. [id number]] RIN [id number] "Transfer of Electronic Prescriptions for Schedules II-V Controlled Substances  Between Pharmacies for Initial Filling"  ____________________________________________________________________________________________     _______________________________________________________________________  Medication Rules  Purpose: To inform patients, and their family members, of our medication rules and regulations.  Applies to: All patients receiving prescriptions from our practice (written or electronic).  Pharmacy of record: This is the pharmacy where your electronic prescriptions will be sent. Make sure we have the correct one.  Electronic prescriptions: In compliance with the [facility] Strengthen Opioid Misuse Prevention (STOP) Act of 2017 (Session Conni Elliot [phone]), effective March 28, 2018, all controlled substances must be electronically prescribed. Written prescriptions, faxing, or calling prescriptions to a pharmacy will no longer be done.  Prescription refills: These will be provided only during in-person appointments. No medications will be renewed without a "face-to-face" evaluation with your provider. Applies to all prescriptions.  NOTE: The following applies primarily to controlled substances (Opioid* Pain Medications).   Type of encounter (visit): For patients receiving controlled substances, face-to-face visits are required. (Not an option and not up to the patient.)  Patient's responsibilities: Pain Pills: Bring all pain pills to every appointment (except for procedure appointments). Pill Bottles: Bring pills in original pharmacy bottle. Bring bottle, even if empty. Always bring the bottle of the most recent fill.  Medication refills: You are responsible for knowing and keeping track of what medications you are taking and when is it that you will need a refill. The day before your appointment: write a list of all prescriptions that need to be refilled. The day of the appointment: give the list to the admitting nurse. Prescriptions will be written only  during appointments. No prescriptions will be written on procedure days. If you forget a  medication: it will not be "Called in", "Faxed", or "electronically sent". You will need to get another appointment to get these prescribed. No early refills. Do not call asking to have your prescription filled early. Partial  or short prescriptions: Occasionally your pharmacy may not have enough pills to fill your prescription.  NEVER ACCEPT a partial fill or a prescription that is short of the total amount of pills that you were prescribed.  With controlled substances the law allows 72 hours for the pharmacy to complete the prescription.  If the prescription is not completed within 72 hours, the pharmacist will require a new prescription to be written. This means that you will be short on your medicine and we WILL NOT send another prescription to complete your original prescription.  Instead, request the pharmacy to send a carrier to a nearby branch to get enough medication to provide you with your full prescription. Prescription Accuracy: You are responsible for carefully inspecting your prescriptions before leaving our office. Have the discharge nurse carefully go over each prescription with you, before taking them home. Make sure that your name is accurately spelled, that your address is correct. Check the name and dose of your medication to make sure it is accurate. Check the number of pills, and the written instructions to make sure they are clear and accurate. Make sure that you are given enough medication to last until your next medication refill appointment. Taking Medication: Take medication as prescribed. When it comes to controlled substances, taking less pills or less frequently than prescribed is permitted and encouraged. Never take more pills than instructed. Never take the medication more frequently than prescribed.  Inform other Doctors: Always inform, all of your healthcare providers, of all the  medications you take. Pain Medication from other Providers: You are not allowed to accept any additional pain medication from any other Doctor or Healthcare provider. There are two exceptions to this rule. (see below) In the event that you require additional pain medication, you are responsible for notifying us, as stated below. Cough Medicine: Often these contain an opioid, such as codeine or hydrocodone. Never accept or take cough medicine containing these opioids if you are already taking an opioid* medication. The combination may cause respiratory failure and death. Medication Agreement: You are responsible for carefully reading and following our Medication Agreement. This must be signed before receiving any prescriptions from our practice. Safely store a copy of your signed Agreement. Violations to the Agreement will result in no further prescriptions. (Additional copies of our Medication Agreement are available upon request.) Laws, Rules, & Regulations: All patients are expected to follow all [address] and [employer], [employer], Rules, [place] [place]. Ignorance of the Laws does not constitute a valid excuse.  Illegal drugs and Controlled Substances: The use of illegal substances (including, but not limited to marijuana and its derivatives) and/or the illegal use of any controlled substances is strictly prohibited. Violation of this rule may result in the immediate and permanent discontinuation of any and all prescriptions being written by our practice. The use of any illegal substances is prohibited. Adopted CDC guidelines & recommendations: Target dosing levels will be at or below 60 MME/day. Use of benzodiazepines** is not recommended.  Exceptions: There are only two exceptions to the rule of not receiving pain medications from other Healthcare Providers. Exception #1 (Emergencies): In the event of an emergency (i.e.: accident requiring emergency care), you are allowed to receive additional pain  medication. However, you are responsible for: As soon as  you are able, call our office [phone], at any time of the day or night, and leave a message stating your name, the date and nature of the emergency, and the name and dose of the medication prescribed. In the event that your call is answered by a member of our staff, make sure to document and save the date, time, and the name of the person that took your information.  Exception #2 (Planned Surgery): In the event that you are scheduled by another doctor or dentist to have any type of surgery or procedure, you are allowed (for a period no longer than 30 days), to receive additional pain medication, for the acute post-op pain. However, in this case, you are responsible for picking up a copy of our "Post-op Pain Management for Surgeons" handout, and giving it to your surgeon or dentist. This document is available at our office, and does not require an appointment to obtain it. Simply go to our office during business hours (Monday-Thursday from 8:00 AM to 4:00 PM) (Friday 8:00 AM to 12:00 Noon) or if you have a scheduled appointment with [place], prior to your surgery, and ask for it by name. In addition, you are responsible for: calling our office [phone], at any time of the day or night, and leaving a message stating your name, name of your surgeon, type of surgery, and date of procedure or surgery. Failure to comply with your responsibilities may result in termination of therapy involving the controlled substances. Medication Agreement Violation. Following the above rules, including your responsibilities will help you in avoiding a Medication Agreement Violation ("Breaking your Pain Medication Contract").  Consequences:  Not following the above rules may result in permanent discontinuation of medication prescription therapy.  *Opioid medications include: morphine, codeine, oxycodone, oxymorphone, hydrocodone, hydromorphone, meperidine, tramadol,  tapentadol, buprenorphine, fentanyl, methadone. **Benzodiazepine medications include: diazepam (Valium), alprazolam (Xanax), clonazepam (Klonopine), lorazepam (Ativan), clorazepate (Tranxene), chlordiazepoxide (Librium), estazolam (Prosom), oxazepam (Serax), temazepam (Restoril), triazolam (Halcion) (Last updated: 01/18/2022) ______________________________________________________________________    ______________________________________________________________________  Medication Recommendations and Reminders  Applies to: All patients receiving prescriptions (written and/or electronic).  Medication Rules & Regulations: You are responsible for reading, knowing, and following our "Medication Rules" document. These exist for your safety and that of others. They are not flexible and neither are we. Dismissing or ignoring them is an act of "non-compliance" that may result in complete and irreversible termination of such medication therapy. For safety reasons, "non-compliance" will not be tolerated. As with the [place] fundamental legal principle of "ignorance of the law is no defense", we will accept no excuses for not having read and knowing the content of documents provided to you by our practice.  Pharmacy of record:  Definition: This is the pharmacy where your electronic prescriptions will be sent.  We do not endorse any particular pharmacy. It is up to you and your insurance to decide what pharmacy to use.  We do not restrict you in your choice of pharmacy. However, once we write for your prescriptions, we will NOT be re-sending more prescriptions to fix restricted supply problems created by your pharmacy, or your insurance.  The pharmacy listed in the electronic medical record should be the one where you want electronic prescriptions to be sent. If you choose to change pharmacy, simply notify our nursing staff. Changes will be made only during your regular appointments and not over the  phone.  Recommendations: Keep all of your pain medications in a safe place, under lock and key, even  if you live alone. We will NOT replace lost, stolen, or damaged medication. We do not accept "Police Reports" as proof of medications having been stolen. After you fill your prescription, take 1 week's worth of pills and put them away in a safe place. You should keep a separate, properly labeled bottle for this purpose. The remainder should be kept in the original bottle. Use this as your primary supply, until it runs out. Once it's gone, then you know that you have 1 week's worth of medicine, and it is time to come in for a prescription refill. If you do this correctly, it is unlikely that you will ever run out of medicine. To make sure that the above recommendation works, it is very important that you make sure your medication refill appointments are scheduled at least 1 week before you run out of medicine. To do this in an effective manner, make sure that you do not leave the office without scheduling your next medication management appointment. Always ask the nursing staff to show you in your prescription , when your medication will be running out. Then arrange for the receptionist to get you a return appointment, at least 7 days before you run out of medicine. Do not wait until you have 1 or 2 pills left, to come in. This is very poor planning and does not take into consideration that we may need to cancel appointments due to bad weather, sickness, or emergencies affecting our staff. DO NOT ACCEPT A "Partial Fill": If for any reason your pharmacy does not have enough pills/tablets to completely fill or refill your prescription, do not allow for a "partial fill". The law allows the pharmacy to complete that prescription within 72 hours, without requiring a new prescription. If they do not fill the rest of your prescription within those 72 hours, you will need a separate prescription to fill the remaining  amount, which we will NOT provide. If the reason for the partial fill is your insurance, you will need to talk to the pharmacist about payment alternatives for the remaining tablets, but again, DO NOT ACCEPT A PARTIAL FILL, unless you can trust your pharmacist to obtain the remainder of the pills within 72 hours.  Prescription refills and/or changes in medication(s):  Prescription refills, and/or changes in dose or medication, will be conducted only during scheduled medication management appointments. (Applies to both, written and electronic prescriptions.) No refills on procedure days. No medication will be changed or started on procedure days. No changes, adjustments, and/or refills will be conducted on a procedure day. Doing so will interfere with the diagnostic portion of the procedure. No phone refills. No medications will be "called into the pharmacy". No Fax refills. No weekend refills. No Holliday refills. No after hours refills.  Remember:  Business hours are:  Monday to Thursday 8:00 AM to 4:00 PM Provider's Schedule: Delano Metz, MD - Appointments are:  Medication management: Monday and Wednesday 8:00 AM to 4:00 PM Procedure day: Tuesday and Thursday 7:30 AM to 4:00 PM Edward Jolly, MD - Appointments are:  Medication management: Tuesday and Thursday 8:00 AM to 4:00 PM Procedure day: Monday and Wednesday 7:30 AM to 4:00 PM (Last update: 01/18/2022) ______________________________________________________________________   ____________________________________________________________________________________________  Naloxone Nasal Spray  Why am I receiving this medication? [place] STOP ACT requires that all patients taking high dose opioids or at risk of opioids respiratory depression, be prescribed an opioid reversal agent, such as Naloxone (AKA: Narcan).  What is this medication? NALOXONE (  nal OX one) treats opioid overdose, which causes slow or shallow breathing,  severe drowsiness, or trouble staying awake. Call emergency services after using this medication. You may need additional treatment. Naloxone works by reversing the effects of opioids. It belongs to a group of medications called opioid blockers.  COMMON BRAND NAME(S): Kloxxado, Narcan  What should I tell my care team before I take this medication? They need to know if you have any of these conditions: Heart disease Substance use disorder An unusual or allergic reaction to naloxone, other medications, foods, dyes, or preservatives Pregnant or trying to get pregnant Breast-feeding  When to use this medication? This medication is to be used for the treatment of respiratory depression (less than 8 breaths per minute) secondary to opioid overdose.   How to use this medication? This medication is for use in the nose. Lay the person on their back. Support their neck with your hand and allow the head to tilt back before giving the medication. The nasal spray should be given into 1 nostril. After giving the medication, move the person onto their side. Do not remove or test the nasal spray until ready to use. Get emergency medical help right away after giving the first dose of this medication, even if the person wakes up. You should be familiar with how to recognize the signs and symptoms of a narcotic overdose. If more doses are needed, give the additional dose in the other nostril. Talk to your care team about the use of this medication in children. While this medication may be prescribed for children as young as newborns for selected conditions, precautions do apply.  Naloxone Overdosage: If you think you have taken too much of this medicine contact a poison control center or emergency room at once.  NOTE: This medicine is only for you. Do not share this medicine with others.  What if I miss a dose? This does not apply.  What may interact with this medication? This is only used during an  emergency. No interactions are expected during emergency use. This list may not describe all possible interactions. Give your health care provider a list of all the medicines, herbs, non-prescription drugs, or dietary supplements you use. Also tell them if you smoke, drink alcohol, or use illegal drugs. Some items may interact with your medicine.  What should I watch for while using this medication? Keep this medication ready for use in the case of an opioid overdose. Make sure that you have the phone number of your care team and local hospital ready. You may need to have additional doses of this medication. Each nasal spray contains a single dose. Some emergencies may require additional doses. After use, bring the treated person to the nearest hospital or call 911. Make sure the treating care team knows that the person has received a dose of this medication. You will receive additional instructions on what to do during and after use of this medication before an emergency occurs.  What side effects may I notice from receiving this medication? Side effects that you should report to your care team as soon as possible: Allergic reactions--skin rash, itching, hives, swelling of the face, lips, tongue, or throat Side effects that usually do not require medical attention (report these to your care team if they continue or are bothersome): Constipation Dryness or irritation inside the nose Headache Increase in blood pressure Muscle spasms Stuffy nose Toothache This list may not describe all possible side effects. Call your doctor for  medical advice about side effects. You may report side effects to FDA at 1-800-FDA-1088.  Where should I keep my medication? Because this is an emergency medication, you should keep it with you at all times.  Keep out of the reach of children and pets. Store between 20 and 25 degrees C (68 and 77 degrees F). Do not freeze. Throw away any unused medication after the  expiration date. Keep in original box until ready to use.  NOTE: This sheet is a summary. It may not cover all possible information. If you have questions about this medicine, talk to your doctor, pharmacist, or health care provider.   2023 [employer]/Gold Standard (2020-11-20 00:00:00)  ____________________________________________________________________________________________

## 2023-02-21 NOTE — Progress Notes (Unsigned)
PROVIDER NOTE: Information contained herein reflects review and annotations entered in association with encounter. Interpretation of such information and data should be left to medically-trained personnel. Information provided to patient can be located elsewhere in the medical record under "Patient Instructions". Document created using STT-dictation technology, any transcriptional errors that may result from process are unintentional.    Patient: Lance Roman  Service Category: E/M  Provider: Oswaldo Done, MD  DOB: 1960/07/29  DOS: 02/22/2023  Referring Provider: Myrlene Roman, *  MRN: [id number]  Specialty: Interventional Pain Management  PCP: Lance Broker, MD  Type: Established Patient  Setting: Ambulatory outpatient    Location: Office  Delivery: Face-to-face     HPI  [name], a 62 y.o. year old male, is here today because of his Chronic bilateral low back pain without sciatica [M54.50, G89.29]. [name] primary complain today is No chief complaint on file.  Pertinent problems: Mr. Kimble has Chronic low back pain (1ry area of Pain) (Bilateral) (L>R) w/o sciatica; Chronic pain syndrome; Chronic lower extremity pain (2ry area of Pain) (Left); Chronic lumbar radicular pain (L5) (Left); Retrolisthesis (4-5 mm) of L2/L3 and L3/L4; Failed back surgical syndrome (Fusion L4-S1); Lumbar facet syndrome (Bilateral) (L>R); Spinal cord stimulator dysfunction (fractured leads); Lumbar facet osteoarthritis (Bilateral); Spondylosis without myelopathy or radiculopathy, lumbosacral region; Other specified dorsopathies, sacral and sacrococcygeal region; Chronic hip pain (Left); Chronic knee pain (Left); DDD (degenerative disc disease), lumbosacral; Neurogenic pain; Chronic upper back pain; Chronic musculoskeletal pain; Epidural fibrosis; Abnormal CT/myelogram scan, lumbar spine (08/28/2017); Myofascial pain syndrome of thoracic spine; and Chronic midline thoracic back pain on  their pertinent problem list. Pain Assessment: Severity of   is reported as a  /10. Location:    / . Onset:  . Quality:  . Timing:  . Modifying factor(s):  Marland Kitchen Vitals:  vitals were not taken for this visit.  BMI: Estimated body mass index is 33.72 kg/m as calculated from the following:   Height as of 11/23/22: 5\' 10"  (1.778 m).   Weight as of 11/23/22: 235 lb (106.6 kg). Last encounter: 11/23/2022. Last procedure: 03/29/2022.  Reason for encounter: medication management. ***  Routine UDS ordered today.   Discussed the use of AI scribe software for clinical note transcription with the patient, who gave verbal consent to proceed.  History of Present Illness         RTCB: 05/27/2023   Pharmacotherapy Assessment  Analgesic: Oxycodone IR 5 mg, 1 tab PO q 6 hrs (20 mg/day of oxycodone) + OxyContin 10 mg, 1 tab p.o. twice daily (20 mg/day of oxycodone) (total of 40 mg/day of oxycodone) MME/day: 60 mg/day.   Monitoring: [place] PMP: PDMP reviewed during this encounter.       Pharmacotherapy: No side-effects or adverse reactions reported. Compliance: No problems identified. Effectiveness: Clinically acceptable.  No notes on file  No results found for: "CBDTHCR" No results found for: "D8THCCBX" No results found for: "D9THCCBX"  UDS:  Summary  Date Value Ref Range Status  08/08/2022 Note  Final    Comment:    ==================================================================== [employer] [facility] (MW) ==================================================================== Test                             Result       Flag       Units  Drug Present and Declared for Prescription Verification   Oxycodone  3626         EXPECTED   ng/mg creat   Oxymorphone                    1658         EXPECTED   ng/mg creat   Noroxycodone                   8105         EXPECTED   ng/mg creat   Noroxymorphone                 389          EXPECTED   ng/mg creat    Sources of oxycodone are  scheduled prescription medications.    Oxymorphone, noroxycodone, and noroxymorphone are expected    metabolites of oxycodone. Oxymorphone is also available as a    scheduled prescription medication.  Drug Present not Declared for Prescription Verification   Hydrocodone                    418          UNEXPECTED ng/mg creat   Dihydrocodeine                 80           UNEXPECTED ng/mg creat   Norhydrocodone                 436          UNEXPECTED ng/mg creat    Sources of hydrocodone include scheduled prescription medications.    Dihydrocodeine and norhydrocodone are expected metabolites of    hydrocodone. Dihydrocodeine is also available as a scheduled    prescription medication.  ==================================================================== Test                      Result    Flag   Units      Ref Range   Creatinine              99               mg/dL      >=64 ==================================================================== Declared Medications:  The flagging and interpretation on this report are based on the  following declared medications.  Unexpected results may arise from  inaccuracies in the declared medications.   **Note: The testing scope of this panel includes these medications:   Oxycodone (Oxycontin)   **Note: The testing scope of this panel does not include the  following reported medications:   Baclofen (Lioresal)  Esomeprazole (Nexium)  Gabapentin (Neurontin)  Ibuprofen (Advil)  Latanoprost (Xalatan)  Naloxone (Narcan) ==================================================================== For clinical consultation, please call [phone]. ====================================================================       ROS  Constitutional: Denies any fever or chills Gastrointestinal: No reported hemesis, hematochezia, vomiting, or acute GI distress Musculoskeletal: Denies any acute onset joint swelling, redness, loss of ROM, or  weakness Neurological: No reported episodes of acute onset apraxia, aphasia, dysarthria, agnosia, amnesia, paralysis, loss of coordination, or loss of consciousness  Medication Review  baclofen, esomeprazole, ibuprofen, latanoprost, naloxone, and oxyCODONE  History Review  Allergy: [name] has No Known Allergies. Drug: [name]  reports no history of drug use. Alcohol:  reports current alcohol use of about 3.0 standard drinks of alcohol per week. Tobacco:  reports that he has quit smoking. His smokeless tobacco use includes chew. Social: [name]  reports that he has quit smoking. His  smokeless tobacco use includes chew. He reports current alcohol use of about 3.0 standard drinks of alcohol per week. He reports that he does not use drugs. Medical:  has a past medical history of Acute postoperative pain (11/21/2017), Allergic rhinitis, Chronic pain syndrome, DDD (degenerative disc disease), lumbosacral, GERD (gastroesophageal reflux disease), Hyperlipidemia, Osteoarthritis, and S/P appendectomy. Surgical: Mr. Blythe  has a past surgical history that includes Appendectomy; repairs of bilateral ankle fractures; Spinal fusion (N/A); and Spinal cord stimulator implant (N/A). Family: family history includes Diabetes in his maternal grandmother; Heart disease in his father.  Laboratory Chemistry Profile   Renal Lab Results  Component Value Date   BUN 24 (H) 04/29/2022   CREATININE 0.77 04/29/2022   BCR 27 (H) 12/12/2016   GFR 97.35 04/29/2022   GFRAA 120 12/12/2016   GFRNONAA 104 12/12/2016    Hepatic Lab Results  Component Value Date   AST 19 04/29/2022   ALT 19 04/29/2022   ALBUMIN 4.3 04/29/2022   ALKPHOS 90 04/29/2022   HCVAB NEGATIVE 12/30/2014    Electrolytes Lab Results  Component Value Date   NA 140 04/29/2022   K 3.8 04/29/2022   CL 104 04/29/2022   CALCIUM 9.3 04/29/2022   MG 1.9 12/12/2016    Bone Lab Results  Component Value Date   25OHVITD1 44 12/12/2016    25OHVITD2 <1.0 12/12/2016   25OHVITD3 43 12/12/2016    Inflammation (CRP: Acute Phase) (ESR: Chronic Phase) Lab Results  Component Value Date   CRP 2.5 12/12/2016   ESRSEDRATE 10 12/12/2016         Note: Above Lab results reviewed.  Recent Imaging Review  DG PAIN CLINIC C-ARM 1-60 MIN NO REPORT Fluoro was used, but no Radiologist interpretation will be provided.  Please refer to "NOTES" tab for provider progress note. Note: Reviewed        Physical Exam  General appearance: Well nourished, well developed, and well hydrated. In no apparent acute distress Mental status: Alert, oriented x 3 (person, place, & time)       Respiratory: No evidence of acute respiratory distress Eyes: PERLA Vitals: There were no vitals taken for this visit. BMI: Estimated body mass index is 33.72 kg/m as calculated from the following:   Height as of 11/23/22: 5\' 10"  (1.778 m).   Weight as of 11/23/22: 235 lb (106.6 kg). Ideal: Patient weight not recorded  Assessment   Diagnosis Status  1. Chronic low back pain (1ry area of Pain) (Bilateral) (L>R) w/o sciatica   2. Chronic lower extremity pain (2ry area of Pain) (Left)   3. Failed back surgical syndrome (Fusion L4-S1)   4. Chronic upper back pain   5. Lumbar facet syndrome (Bilateral) (L>R)   6. Chronic musculoskeletal pain   7. Chronic pain syndrome   8. Pharmacologic therapy   9. Chronic use of opiate for therapeutic purpose   10. Encounter for medication management   11. Encounter for chronic pain management    Controlled Controlled Controlled   Updated Problems: No problems updated.  Plan of Care  Problem-specific:  Assessment and Plan            [name] has a current medication list which includes the following long-term medication(s): baclofen, esomeprazole, oxycodone, and oxycodone.  Pharmacotherapy (Medications Ordered): No orders of the defined types were placed in this encounter.  Orders:  No orders of the  defined types were placed in this encounter.  Follow-up plan:   No follow-ups on file.  Interventional Therapies  Risk  Complexity Considerations:     NOTE: Poor candidate for RFA 2ry to Hardware   Planned  Pending:      Under consideration:   Diagnostic/therapeutic midline thoracic TPI/MNB #1  Therapeutic left caudal ESI #2  Diagnostic/therapeutic left L2-3 LESI #1  Diagnostic bilateral thoracolumbar facet MBB (L1-2 through L3-4) #1  Diagnostic/therapeutic bilateral L1-2 and L2-3 transforaminal ESI #1  Possible Racz procedure #1    Completed:   Diagnostic/therapeutic bilateral lumbar facet MBB x3 (04/27/2021) (100/100/50/70)  Palliative right lumbar facet RFA x2 (03/29/2022) (100/100/50/50)  Palliative left lumbar facet RFA x2 (03/03/2022) (100/100/75/75) w/ improved function and ROM Therapeutic left caudal ESI x1 (05/14/2019) (100/100/50/100) (100% relief of LEP as of 05/26/2021) (743 days) Therapeutic bilateral erector spinae & quadratus lumborum muscle MNB/TPI x2 (04/10/2018) (100/100/0/0)   Therapeutic  Palliative (PRN) options:   Therapeutic/palliative bilateral lumbar facet MBB    Pharmacotherapy  Nonopioids transferred 02/05/2020: Baclofen       Recent Visits Date Type Provider Dept  11/23/22 Office Visit Lance Metz, MD [facility]  Showing recent visits within past 90 days and meeting all other requirements Future Appointments Date Type Provider Dept  02/22/23 Appointment Lance Metz, MD [facility]  Showing future appointments within next 90 days and meeting all other requirements  I discussed the assessment and treatment plan with the patient. The patient was provided an opportunity to ask questions and all were answered. The patient agreed with the plan and demonstrated an understanding of the instructions.  Patient advised to call back or seek an in-person evaluation if the symptoms or condition worsens.  Duration  of encounter: *** minutes.  Total time on encounter, as per AMA guidelines included both the face-to-face and non-face-to-face time personally spent by the physician and/or other qualified health care professional(s) on the day of the encounter (includes time in activities that require the physician or other qualified health care professional and does not include time in activities normally performed by clinical staff). Physician's time may include the following activities when performed: Preparing to see the patient (e.g., pre-charting review of records, searching for previously ordered imaging, lab work, and nerve conduction tests) Review of prior analgesic pharmacotherapies. Reviewing PMP Interpreting ordered tests (e.g., lab work, imaging, nerve conduction tests) Performing post-procedure evaluations, including interpretation of diagnostic procedures Obtaining and/or reviewing separately obtained history Performing a medically appropriate examination and/or evaluation Counseling and educating the patient/family/caregiver Ordering medications, tests, or procedures Referring and communicating with other health care professionals (when not separately reported) Documenting clinical information in the electronic or other health record Independently interpreting results (not separately reported) and communicating results to the patient/ family/caregiver Care coordination (not separately reported)  Note by: Lance Done, MD Date: 02/22/2023; Time: 8:23 PM

## 2023-02-21 NOTE — Patient Instructions (Signed)

## 2023-02-22 ENCOUNTER — Ambulatory Visit (HOSPITAL_BASED_OUTPATIENT_CLINIC_OR_DEPARTMENT_OTHER): Payer: BC Managed Care – PPO | Admitting: Pain Medicine

## 2023-02-22 DIAGNOSIS — G894 Chronic pain syndrome: Secondary | ICD-10-CM

## 2023-02-22 DIAGNOSIS — M961 Postlaminectomy syndrome, not elsewhere classified: Secondary | ICD-10-CM

## 2023-02-22 DIAGNOSIS — Z79891 Long term (current) use of opiate analgesic: Secondary | ICD-10-CM

## 2023-02-22 DIAGNOSIS — Z91199 Patient's noncompliance with other medical treatment and regimen due to unspecified reason: Secondary | ICD-10-CM

## 2023-02-22 DIAGNOSIS — Z79899 Other long term (current) drug therapy: Secondary | ICD-10-CM

## 2023-02-22 DIAGNOSIS — G8929 Other chronic pain: Secondary | ICD-10-CM

## 2023-02-22 DIAGNOSIS — M47816 Spondylosis without myelopathy or radiculopathy, lumbar region: Secondary | ICD-10-CM

## 2023-02-22 DIAGNOSIS — M545 Low back pain, unspecified: Secondary | ICD-10-CM

## 2023-02-28 ENCOUNTER — Encounter: Payer: Self-pay | Admitting: Internal Medicine

## 2023-02-28 ENCOUNTER — Ambulatory Visit: Payer: BC Managed Care – PPO | Admitting: Internal Medicine

## 2023-02-28 VITALS — BP 144/82 | HR 75 | Temp 98.1°F | Ht 70.0 in | Wt 231.0 lb

## 2023-02-28 DIAGNOSIS — M545 Low back pain, unspecified: Secondary | ICD-10-CM | POA: Diagnosis not present

## 2023-02-28 DIAGNOSIS — G8929 Other chronic pain: Secondary | ICD-10-CM | POA: Diagnosis not present

## 2023-02-28 MED ORDER — KETOROLAC TROMETHAMINE 30 MG/ML IJ SOLN
30.0000 mg | Freq: Once | INTRAMUSCULAR | Status: AC
Start: 2023-02-28 — End: 2023-02-28
  Administered 2023-02-28: 30 mg via INTRAMUSCULAR

## 2023-02-28 NOTE — Progress Notes (Unsigned)
   Subjective:   Patient ID: Lance Roman, male    DOB: May 22, 1960, 62 y.o.   MRN: [id number]  HPI The patient is a 62 YO man coming in for pain medication issues. Missed visit with pain management and did run out of medications. He is having moderate to severe pain. He is having some minor withdrawal symptoms.   Review of Systems  Constitutional:  Positive for activity change. Negative for appetite change, fatigue, fever and unexpected weight change.  Respiratory: Negative.    Cardiovascular: Negative.   Musculoskeletal:  Positive for arthralgias, back pain, myalgias and neck pain.  Skin: Negative.   Neurological:  Negative for syncope, weakness and numbness.    Objective:  Physical Exam Constitutional:      Appearance: He is well-developed.  HENT:     Head: Normocephalic and atraumatic.  Cardiovascular:     Rate and Rhythm: Normal rate and regular rhythm.  Pulmonary:     Effort: Pulmonary effort is normal. No respiratory distress.     Breath sounds: Normal breath sounds. No wheezing or rales.  Abdominal:     General: Bowel sounds are normal. There is no distension.     Palpations: Abdomen is soft.     Tenderness: There is no abdominal tenderness. There is no rebound.  Musculoskeletal:        General: Tenderness present.     Cervical back: Normal range of motion.  Skin:    General: Skin is warm and dry.  Neurological:     Mental Status: He is alert and oriented to person, place, and time.     Coordination: Coordination normal.     Vitals:   02/28/23 1010 02/28/23 1018  BP: (!) 144/82 (!) 144/82  Pulse: 75   Temp: 98.1 F (36.7 C)   TempSrc: Oral   SpO2: 98%   Weight: 231 lb (104.8 kg)   Height: 5\' 10"  (1.778 m)     Assessment & Plan:  Toradol 30 mg IM given at visit

## 2023-03-01 ENCOUNTER — Ambulatory Visit: Payer: No Typology Code available for payment source | Attending: Pain Medicine | Admitting: Pain Medicine

## 2023-03-01 ENCOUNTER — Encounter: Payer: Self-pay | Admitting: Pain Medicine

## 2023-03-01 VITALS — BP 170/78 | HR 68 | Temp 98.4°F | Resp 18 | Ht 70.0 in | Wt 230.0 lb

## 2023-03-01 DIAGNOSIS — G8929 Other chronic pain: Secondary | ICD-10-CM | POA: Diagnosis present

## 2023-03-01 DIAGNOSIS — G894 Chronic pain syndrome: Secondary | ICD-10-CM | POA: Insufficient documentation

## 2023-03-01 DIAGNOSIS — M549 Dorsalgia, unspecified: Secondary | ICD-10-CM | POA: Diagnosis present

## 2023-03-01 DIAGNOSIS — M545 Low back pain, unspecified: Secondary | ICD-10-CM | POA: Diagnosis present

## 2023-03-01 DIAGNOSIS — Z79899 Other long term (current) drug therapy: Secondary | ICD-10-CM | POA: Diagnosis present

## 2023-03-01 DIAGNOSIS — M47816 Spondylosis without myelopathy or radiculopathy, lumbar region: Secondary | ICD-10-CM | POA: Diagnosis present

## 2023-03-01 DIAGNOSIS — M961 Postlaminectomy syndrome, not elsewhere classified: Secondary | ICD-10-CM | POA: Diagnosis present

## 2023-03-01 DIAGNOSIS — Z79891 Long term (current) use of opiate analgesic: Secondary | ICD-10-CM | POA: Insufficient documentation

## 2023-03-01 DIAGNOSIS — M7918 Myalgia, other site: Secondary | ICD-10-CM | POA: Diagnosis present

## 2023-03-01 DIAGNOSIS — M79605 Pain in left leg: Secondary | ICD-10-CM | POA: Insufficient documentation

## 2023-03-01 MED ORDER — OXYCODONE HCL 5 MG PO TABS: 5.0000 mg | ORAL_TABLET | Freq: Four times a day (QID) | ORAL | 0 refills | Status: DC | PRN

## 2023-03-01 MED ORDER — OXYCODONE HCL ER 10 MG PO T12A
10.0000 mg | EXTENDED_RELEASE_TABLET | Freq: Two times a day (BID) | ORAL | 0 refills | Status: DC
Start: 2023-03-01 — End: 2023-05-03

## 2023-03-01 MED ORDER — OXYCODONE HCL 5 MG PO TABS
5.0000 mg | ORAL_TABLET | Freq: Four times a day (QID) | ORAL | 0 refills | Status: DC | PRN
Start: 2023-03-31 — End: 2023-05-03

## 2023-03-01 MED ORDER — OXYCODONE HCL ER 10 MG PO T12A: 10.0000 mg | EXTENDED_RELEASE_TABLET | Freq: Two times a day (BID) | ORAL | 0 refills | Status: DC

## 2023-03-01 MED ORDER — OXYCODONE HCL 5 MG PO TABS
5.0000 mg | ORAL_TABLET | Freq: Four times a day (QID) | ORAL | 0 refills | Status: DC | PRN
Start: 2023-04-30 — End: 2023-05-03

## 2023-03-01 MED ORDER — OXYCODONE HCL ER 10 MG PO T12A
10.0000 mg | EXTENDED_RELEASE_TABLET | Freq: Two times a day (BID) | ORAL | 0 refills | Status: DC
Start: 2023-04-30 — End: 2023-05-03

## 2023-03-01 NOTE — Progress Notes (Signed)
PROVIDER NOTE: Information contained herein reflects review and annotations entered in association with encounter. Interpretation of such information and data should be left to medically-trained personnel. Information provided to patient can be located elsewhere in the medical record under "Patient Instructions". Document created using STT-dictation technology, any transcriptional errors that may result from process are unintentional.    Patient: Lance Roman  Service Category: E/M  Provider: Oswaldo Done, MD  DOB: Aug 18, 1960  DOS: 03/01/2023  Referring Provider: Myrlene Broker, *  MRN: [id number]  Specialty: Interventional Pain Management  PCP: Myrlene Broker, MD  Type: Established Patient  Setting: Ambulatory outpatient    Location: Office  Delivery: Face-to-face     HPI  Mr. BLEASE ZEMAITIS, a 62 y.o. year old male, is here today because of his Chronic bilateral low back pain without sciatica [M54.50, G89.29]. Mr. Zelenka primary complain today is Back Pain  Pertinent problems: Mr. Warne has Chronic low back pain (1ry area of Pain) (Bilateral) (L>R) w/o sciatica; Chronic pain syndrome; Chronic lower extremity pain (2ry area of Pain) (Left); Chronic lumbar radicular pain (L5) (Left); Retrolisthesis (4-5 mm) of L2/L3 and L3/L4; Failed back surgical syndrome (Fusion L4-S1); Lumbar facet syndrome (Bilateral) (L>R); Spinal cord stimulator dysfunction (fractured leads); Lumbar facet osteoarthritis (Bilateral); Spondylosis without myelopathy or radiculopathy, lumbosacral region; Other specified dorsopathies, sacral and sacrococcygeal region; Chronic hip pain (Left); Chronic knee pain (Left); DDD (degenerative disc disease), lumbosacral; Neurogenic pain; Chronic upper back pain; Chronic musculoskeletal pain; Epidural fibrosis; Abnormal CT/myelogram scan, lumbar spine (08/28/2017); Myofascial pain syndrome of thoracic spine; and Chronic midline thoracic back pain on their pertinent  problem list. Pain Assessment: Severity of Chronic pain is reported as a 8 /10. Location: Back Lower, Right, Left, Mid, Upper/Pain in upper back to mid back. Seperate pain in lower back but does not radiated.. Onset: More than a month ago. Quality: Constant, Shooting, Tender, Aching, Pins and needles. Timing: Constant. Modifying factor(s): Pain medication and rest. Vitals:  height is 5\' 10"  (1.778 m) and weight is 230 lb (104.3 kg). His temporal temperature is 98.4 F (36.9 C). His blood pressure is 170/78 (abnormal) and his pulse is 68. His respiration is 18 and oxygen saturation is 99%.  BMI: Estimated body mass index is 33 kg/m as calculated from the following:   Height as of this encounter: 5\' 10"  (1.778 m).   Weight as of this encounter: 230 lb (104.3 kg). Last encounter: 02/22/2023. Last procedure: 03/29/2022.  Reason for encounter: medication management.  The patient was scheduled for his usual medication management appointment on 02/22/2023 but he did not show up or called to cancel his appointment.  In addition, during the precharting review of records I noticed that the patient's last UDS done on 08/08/2022 showed up and expected hydrocodone, which was not prescribed by our practice.  Review of the PMP showed that this last time this patient had any hydrocodone prescribed to him was on 03/29/2022 when he had a radiofrequency ablation done and that particular prescription for 21 pills was filled on 03/29/2022.  On Monday, 02/27/2023 he showed up to the clinics without an appointment to see if we could see him since he had not kept his 02/22/2023 appointment.  At the time the schedule did not permit add-ons and therefore he was scheduled to return today for his medication management and refill.  According to the electronic medical record and the prescriptions written for him he should have ran out of medication on 02/26/2023.  Routine UDS  ordered today.   Discussed the use of AI scribe software  for clinical note transcription with the patient, who gave verbal consent to proceed.  History of Present Illness   The patient, with a history of chronic pain, missed his previous appointment due to work commitments. He reports no adverse reactions or side effects to his current pain medication, oxycodone, which was last taken on Sunday. However, he admits to taking his spouse\'s oxycodone over the weekend due to running out of his own medication.  The patient\'s pain is primarily located in the upper back, radiating out from the shoulder blades and down both arms, particularly when lying down. He also reports tenderness and a \'touchy and quicky\' sensation across the lower back.  In a previous urine drug screen, hydrocodone was detected, which was last prescribed to the patient in January.  The patient also has a history of workers\' compensation and expressed concern about potential restrictions on seeing other providers within the practice.     05/30/2023   Pharmacotherapy Assessment  Analgesic: Oxycodone IR 5 mg, 1 tab PO q 6 hrs (20 mg/day of oxycodone) + OxyContin 10 mg, 1 tab p.o. twice daily (20 mg/day of oxycodone) (total of 40 mg/day of oxycodone) MME/day: 60 mg/day.   Monitoring: [place] PMP: PDMP reviewed during this encounter.       Pharmacotherapy: No side-effects or adverse reactions reported. Compliance: No problems identified. Effectiveness: Clinically acceptable.  Ramos, Evelyn, RN  03/01/2023  8:21 AM  Sign when Signing Visit Nursing Pain Medication Assessment:  Safety precautions to be maintained throughout the outpatient stay will include: orient to surroundings, keep bed in low position, maintain call bell within reach at all times, provide assistance with transfer out of bed and ambulation.  Medication Inspection Compliance: Pill count conducted under aseptic conditions, in front of the patient. Neither the pills nor the bottle was removed from the patient\'s sight at any time.  Once count was completed pills were immediately returned to the patient in their original bottle.  Medication #1: Oxycontin 10mg Pill/Patch Count:  0 of 60 pills remain Pill/Patch Appearance: Markings consistent with prescribed medication Bottle Appearance: Standard pharmacy container. Clearly labeled. Filled Date: 11 / 01 / 2024 Last Medication intake:  Ran out of medicine more than 48 hours ago  Medication #2: Oxycodone IR Pill/Patch Count:  0 of 120 pills remain Pill/Patch Appearance: Markings consistent with prescribed medication Bottle Appearance: Standard pharmacy container. Clearly labeled. Filled Date: 11 / 01 / 2024 Last Medication intake:  Ran out of medicine more than 48 hours ago    No results found for: "CBDTHCR" No results found for: "D8THCCBX" No results found for: "D9THCCBX"  UDS:  Summary  Date Value Ref Range Status  08/08/2022 Note  Final    Comment:    ==================================================================== [employer]ox[employer] Select 13 (MW) ==================================================================== Test                             Result       Flag       Units  Drug Present and Declared for Prescription Verification   Oxycodone                      3626         EXPECTED   ng/mg creat   Oxymorphone                    16 58  EXPECTED   ng/mg creat   Noroxycodone                   8105         EXPECTED   ng/mg creat   Noroxymorphone                 389          EXPECTED   ng/mg creat    Sources of oxycodone are scheduled prescription medications.    Oxymorphone, noroxycodone, and noroxymorphone are expected    metabolites of oxycodone. Oxymorphone is also available as a    scheduled prescription medication.  Drug Present not Declared for Prescription Verification   Hydrocodone                    418          UNEXPECTED ng/mg creat   Dihydrocodeine                 80           UNEXPECTED ng/mg creat   Norhydrocodone                 436           UNEXPECTED ng/mg creat    Sources of hydrocodone include scheduled prescription medications.    Dihydrocodeine and norhydrocodone are expected metabolites of    hydrocodone. Dihydrocodeine is also available as a scheduled    prescription medication.  ==================================================================== Test                      Result    Flag   Units      Ref Range   Creatinine              99               mg/dL      >=47 ==================================================================== Declared Medications:  The flagging and interpretation on this report are based on the  following declared medications.  Unexpected results may arise from  inaccuracies in the declared medications.   **Note: The testing scope of this panel includes these medications:   Oxycodone (Oxycontin)   **Note: The testing scope of this panel does not include the  following reported medications:   Baclofen (Lioresal)  Esomeprazole (Nexium)  Gabapentin (Neurontin)  Ibuprofen (Advil)  Latanoprost (Xalatan)  Naloxone (Narcan) ==================================================================== For clinical consultation, please call [phone]. ====================================================================       ROS  Constitutional: Denies any fever or chills Gastrointestinal: No reported hemesis, hematochezia, vomiting, or acute GI distress Musculoskeletal: Denies any acute onset joint swelling, redness, loss of ROM, or weakness Neurological: No reported episodes of acute onset apraxia, aphasia, dysarthria, agnosia, amnesia, paralysis, loss of coordination, or loss of consciousness  Medication Review  baclofen, esomeprazole, ibuprofen, latanoprost, naloxone, and oxyCODONE  History Review  Allergy: Mr. Bean has No Known Allergies. Drug: Mr. Meinholz  reports no history of drug use. Alcohol:  reports current alcohol use of about 3.0 standard drinks of alcohol per  week. Tobacco:  reports that he has quit smoking. His smokeless tobacco use includes chew. Social: Mr. Jungers  reports that he has quit smoking. His smokeless tobacco use includes chew. He reports current alcohol use of about 3.0 standard drinks of alcohol per week. He reports that he does not use drugs. Medical:  has a past medical history of Acute postoperative pain (11/21/2017), Allergic rhinitis, Chronic pain syndrome, DDD (  degenerative disc disease), lumbosacral, GERD (gastroesophageal reflux disease), Hyperlipidemia, Osteoarthritis, and S/P appendectomy. Surgical: Mr. Boxberger  has a past surgical history that includes Appendectomy; repairs of bilateral ankle fractures; Spinal fusion (N/A); and Spinal cord stimulator implant (N/A). Family: family history includes Diabetes in his maternal grandmother; Heart disease in his father.  Laboratory Chemistry Profile   Renal Lab Results  Component Value Date   BUN 24 (H) 04/29/2022   CREATININE 0.77 04/29/2022   BCR 27 (H) 12/12/2016   GFR 97.35 04/29/2022   GFRAA 120 12/12/2016   GFRNONAA 104 12/12/2016    Hepatic Lab Results  Component Value Date   AST 19 04/29/2022   ALT 19 04/29/2022   ALBUMIN 4.3 04/29/2022   ALKPHOS 90 04/29/2022   HCVAB NEGATIVE 12/30/2014    Electrolytes Lab Results  Component Value Date   NA 140 04/29/2022   K 3.8 04/29/2022   CL 104 04/29/2022   CALCIUM 9.3 04/29/2022   MG 1.9 12/12/2016    Bone Lab Results  Component Value Date   25OHVITD1 44 12/12/2016   25OHVITD2 <1.0 12/12/2016   25OHVITD3 43 12/12/2016    Inflammation (CRP: Acute Phase) (ESR: Chronic Phase) Lab Results  Component Value Date   CRP 2.5 12/12/2016   ESRSEDRATE 10 12/12/2016         Note: Above Lab results reviewed.  Recent Imaging Review  DG PAIN CLINIC C-ARM 1-60 MIN NO REPORT Fluoro was used, but no Radiologist interpretation will be provided.  Please refer to "NOTES" tab for provider progress note. Note: Reviewed         Physical Exam  General appearance: Well nourished, well developed, and well hydrated. In no apparent acute distress Mental status: Alert, oriented x 3 (person, place, & time)       Respiratory: No evidence of acute respiratory distress Eyes: PERLA Vitals: BP (!) 170/78   Pulse 68   Temp 98.4 F (36.9 C) (Temporal)   Resp 18   Ht 5\' 10"  (1.778 m)   Wt 230 lb (104.3 kg)   SpO2 99%   BMI 33.00 kg/m  BMI: Estimated body mass index is 33 kg/m as calculated from the following:   Height as of this encounter: 5\' 10"  (1.778 m).   Weight as of this encounter: 230 lb (104.3 kg). Ideal: Ideal body weight: 73 kg (160 lb 15 oz) Adjusted ideal body weight: 85.5 kg (188 lb 9 oz)  Assessment   Diagnosis Status  1. Chronic low back pain (1ry area of Pain) (Bilateral) (L>R) w/o sciatica   2. Chronic lower extremity pain (2ry area of Pain) (Left)   3. Failed back surgical syndrome (Fusion L4-S1)   4. Chronic upper back pain   5. Lumbar facet syndrome (Bilateral) (L>R)   6. Chronic musculoskeletal pain   7. Chronic pain syndrome   8. Pharmacologic therapy   9. Chronic use of opiate for therapeutic purpose   10. Encounter for medication management   11. Encounter for chronic pain management    Controlled Controlled Controlled   Updated Problems: No problems updated.  Plan of Care  Problem-specific:  Assessment and Plan    Chronic Pain   He experiences chronic pain in the upper back, shoulder blades, lower back, and radiating down both arms, severe and persistent, necessitating ongoing management with pain medication. A recent urine drug screen (UDS) detected hydrocodone, which was not recently prescribed. He admitted to using his spouse's oxycodone after running out of his own medication. We discussed the  risks associated with taking non-prescribed medications, including legal and health implications, and stressed the importance of adhering to prescribed treatments. We will order a UDS  today, send prescriptions to [facility] in [place], and schedule a follow-up in three months. We advised him to discard old or unused medications to prevent accidental ingestion and reiterated the warning against taking non-prescribed medications, emphasizing the legal and health risks.  Medication Management   His medication management requires close monitoring due to the recent UDS findings and his admission of taking non-prescribed medication. He is currently on oxycodone for pain management. We discussed the addition of a nurse practitioner to handle medication refills and the need for flexibility with the worker's compensation caseworker to allow seeing other providers if necessary. We will add a nurse practitioner to manage medication refills and ensure he has appointments with his primary doctor for specific issues. We will also discuss with the worker's compensation caseworker the need for flexibility in seeing other providers.  Follow-up   We will schedule a follow-up in three months and ensure a urine sample is provided today.       Mr. CHENEY FUGITT has a current medication list which includes the following long-term medication(s): baclofen, esomeprazole, oxycodone, [START ON 03/31/2023] oxycodone, [START ON 04/30/2023] oxycodone, oxycodone, [START ON 03/31/2023] oxycodone, and [START ON 04/30/2023] oxycodone.  Pharmacotherapy (Medications Ordered): Meds ordered this encounter  Medications   oxyCODONE (OXYCONTIN) 10 mg 12 hr tablet    Sig: Take 1 tablet (10 mg total) by mouth every 12 (twelve) hours. Must last 30 days.    Dispense:  60 tablet    Refill:  0    DO NOT: delete (not duplicate); no partial-fill (will deny script to complete), no refill request (F/U required). DISPENSE: 1 day early if closed on fill date. WARN: No CNS-depressants within 8 hrs of med.   oxyCODONE (OXY IR/ROXICODONE) 5 MG immediate release tablet    Sig: Take 1 tablet (5 mg total) by mouth every 6 (six) hours  as needed for severe pain (pain score 7-10). Must last 30 days.    Dispense:  120 tablet    Refill:  0    DO NOT: delete (not duplicate); no partial-fill (will deny script to complete), no refill request (F/U required). DISPENSE: 1 day early if closed on fill date. WARN: No CNS-depressants within 8 hrs of med.   oxyCODONE (OXYCONTIN) 10 mg 12 hr tablet    Sig: Take 1 tablet (10 mg total) by mouth every 12 (twelve) hours. Must last 30 days.    Dispense:  60 tablet    Refill:  0    DO NOT: delete (not duplicate); no partial-fill (will deny script to complete), no refill request (F/U required). DISPENSE: 1 day early if closed on fill date. WARN: No CNS-depressants within 8 hrs of med.   oxyCODONE (OXYCONTIN) 10 mg 12 hr tablet    Sig: Take 1 tablet (10 mg total) by mouth every 12 (twelve) hours. Must last 30 days.    Dispense:  60 tablet    Refill:  0    DO NOT: delete (not duplicate); no partial-fill (will deny script to complete), no refill request (F/U required). DISPENSE: 1 day early if closed on fill date. WARN: No CNS-depressants within 8 hrs of med.   oxyCODONE (OXY IR/ROXICODONE) 5 MG immediate release tablet    Sig: Take 1 tablet (5 mg total) by mouth every 6 (six) hours as needed for severe pain (pain score 7-10).  Must last 30 days.    Dispense:  120 tablet    Refill:  0    DO NOT: delete (not duplicate); no partial-fill (will deny script to complete), no refill request (F/U required). DISPENSE: 1 day early if closed on fill date. WARN: No CNS-depressants within 8 hrs of med.   oxyCODONE (OXY IR/ROXICODONE) 5 MG immediate release tablet    Sig: Take 1 tablet (5 mg total) by mouth every 6 (six) hours as needed for severe pain (pain score 7-10). Must last 30 days.    Dispense:  120 tablet    Refill:  0    DO NOT: delete (not duplicate); no partial-fill (will deny script to complete), no refill request (F/U required). DISPENSE: 1 day early if closed on fill date. WARN: No CNS-depressants  within 8 hrs of med.   Orders:  Orders Placed This Encounter  Procedures   ToxASSURE Select 13 (MW), Urine    Volume: 30 ml(s). Minimum 3 ml of urine is needed. Document temperature of fresh sample. Indications: Long term (current) use of opiate analgesic (Z61.096)    Order Specific Question:   Release to patient    Answer:   Immediate   Nursing Instructions:    1). STAT: UDS required today. 2). Make sure to document all opioids and benzodiazepines taken, including time of last intake. 3). If order is entered on a procedure day, make sure sample is obtained before any medications are administered.   Follow-up plan:   Return in about 3 months (around 05/30/2023) for Eval-day (M,W), (MM).      Interventional Therapies  Risk  Complexity Considerations:     NOTE: Poor candidate for RFA 2ry to Hardware   Planned  Pending:      Under consideration:   Diagnostic/therapeutic midline thoracic TPI/MNB #1  Therapeutic left caudal ESI #2  Diagnostic/therapeutic left L2-3 LESI #1  Diagnostic bilateral thoracolumbar facet MBB (L1-2 through L3-4) #1  Diagnostic/therapeutic bilateral L1-2 and L2-3 transforaminal ESI #1  Possible Racz procedure #1    Completed:   Diagnostic/therapeutic bilateral lumbar facet MBB x3 (04/27/2021) (100/100/50/70)  Palliative right lumbar facet RFA x2 (03/29/2022) (100/100/50/50)  Palliative left lumbar facet RFA x2 (03/03/2022) (100/100/75/75) w/ improved function and ROM Therapeutic left caudal ESI x1 (05/14/2019) (100/100/50/100) (100% relief of LEP as of 05/26/2021) (743 days) Therapeutic bilateral erector spinae & quadratus lumborum muscle MNB/TPI x2 (04/10/2018) (100/100/0/0)   Therapeutic  Palliative (PRN) options:   Therapeutic/palliative bilateral lumbar facet MBB    Pharmacotherapy  Nonopioids transferred 02/05/2020: Baclofen       Recent Visits No visits were found meeting these conditions. Showing recent visits within past 90 days and  meeting all other requirements Today's Visits Date Type Provider Dept  03/01/23 Office Visit Delano Metz, MD [facility]  Showing today's visits and meeting all other requirements Future Appointments Date Type Provider Dept  05/24/23 Appointment Delano Metz, MD [facility]  Showing future appointments within next 90 days and meeting all other requirements  I discussed the assessment and treatment plan with the patient. The patient was provided an opportunity to ask questions and all were answered. The patient agreed with the plan and demonstrated an understanding of the instructions.  Patient advised to call back or seek an in-person evaluation if the symptoms or condition worsens.  Duration of encounter: 30 minutes.  Total time on encounter, as per AMA guidelines included both the face-to-face and non-face-to-face time personally spent by the physician and/or other qualified  health care professional(s) on the day of the encounter (includes time in activities that require the physician or other qualified health care professional and does not include time in activities normally performed by clinical staff). Physician's time may include the following activities when performed: Preparing to see the patient (e.g., pre-charting review of records, searching for previously ordered imaging, lab work, and nerve conduction tests) Review of prior analgesic pharmacotherapies. Reviewing PMP Interpreting ordered tests (e.g., lab work, imaging, nerve conduction tests) Performing post-procedure evaluations, including interpretation of diagnostic procedures Obtaining and/or reviewing separately obtained history Performing a medically appropriate examination and/or evaluation Counseling and educating the patient/family/caregiver Ordering medications, tests, or procedures Referring and communicating with other health care professionals (when not separately  reported) Documenting clinical information in the electronic or other health record Independently interpreting results (not separately reported) and communicating results to the patient/ family/caregiver Care coordination (not separately reported)  Note by: Oswaldo Done, MD Date: 03/01/2023; Time: 12:06 PM

## 2023-03-01 NOTE — Assessment & Plan Note (Signed)
Informed and educated since he has a pain contract and provider we would not prescribe pain medication even if he is out and missed apt. He is given toradol 30 mg IM today to help with pain short term until upcoming appointment.

## 2023-03-01 NOTE — Progress Notes (Signed)
Nursing Pain Medication Assessment:  Safety precautions to be maintained throughout the outpatient stay will include: orient to surroundings, keep bed in low position, maintain call bell within reach at all times, provide assistance with transfer out of bed and ambulation.  Medication Inspection Compliance: Pill count conducted under aseptic conditions, in front of the patient. Neither the pills nor the bottle was removed from the patient's sight at any time. Once count was completed pills were immediately returned to the patient in their original bottle.  Medication #1: Oxycontin 10mg  Pill/Patch Count:  0 of 60 pills remain Pill/Patch Appearance: Markings consistent with prescribed medication Bottle Appearance: Standard pharmacy container. Clearly labeled. Filled Date: 53 / 01 / 2024 Last Medication intake:  Ran out of medicine more than 48 hours ago  Medication #2: Oxycodone IR Pill/Patch Count:  0 of 120 pills remain Pill/Patch Appearance: Markings consistent with prescribed medication Bottle Appearance: Standard pharmacy container. Clearly labeled. Filled Date: 50 / 01 / 2024 Last Medication intake:  Ran out of medicine more than 48 hours ago

## 2023-03-01 NOTE — Patient Instructions (Signed)
______________________________________________________________________    Medication Rules  Purpose: To inform patients, and their family members, of our medication rules and regulations.  Applies to: All patients receiving prescriptions from our practice (written or electronic).  Pharmacy of record: This is the pharmacy where your electronic prescriptions will be sent. Make sure we have the correct one.  Electronic prescriptions: In compliance with the [facility] Strengthen Opioid Misuse Prevention (STOP) Act of 2017 (Session Conni Elliot [phone]), effective March 28, 2018, all controlled substances must be electronically prescribed. Written prescriptions, faxing, or calling prescriptions to a pharmacy will no longer be done.  Prescription refills: These will be provided only during in-person appointments. No medications will be renewed without a "face-to-face" evaluation with your provider. Applies to all prescriptions.  NOTE: The following applies primarily to controlled substances (Opioid* Pain Medications).   Type of encounter (visit): For patients receiving controlled substances, face-to-face visits are required. (Not an option and not up to the patient.)  Patient's Responsibilities: Pain Pills: Bring all pain pills to every appointment (except for procedure appointments). Pill counts are required.  Pill Bottles: Bring pills in original pharmacy bottle. Bring bottle, even if empty. Always bring the bottle of the most recent fill.  Medication refills: You are responsible for knowing and keeping track of what medications you are taking and when is it that you will need a refill. The day before your appointment: write a list of all prescriptions that need to be refilled. The day of the appointment: give the list to the admitting nurse. Prescriptions will be written only during appointments. No prescriptions will be written on procedure days. If you forget a medication: it will not be  "Called in", "Faxed", or "electronically sent". You will need to get another appointment to get these prescribed. No early refills. Do not call asking to have your prescription filled early. Partial  or short prescriptions: Occasionally your pharmacy may not have enough pills to fill your prescription.  NEVER ACCEPT a partial fill or a prescription that is short of the total amount of pills that you were prescribed.  With controlled substances the law allows 72 hours for the pharmacy to complete the prescription.  If the prescription is not completed within 72 hours, the pharmacist will require a new prescription to be written. This means that you will be short on your medicine and we WILL NOT send another prescription to complete your original prescription.  Instead, request the pharmacy to send a carrier to a nearby branch to get enough medication to provide you with your full prescription. Prescription Accuracy: You are responsible for carefully inspecting your prescriptions before leaving our office. Have the discharge nurse carefully go over each prescription with you, before taking them home. Make sure that your name is accurately spelled, that your address is correct. Check the name and dose of your medication to make sure it is accurate. Check the number of pills, and the written instructions to make sure they are clear and accurate. Make sure that you are given enough medication to last until your next medication refill appointment. Taking Medication: Take medication as prescribed. When it comes to controlled substances, taking less pills or less frequently than prescribed is permitted and encouraged. Never take more pills than instructed. Never take the medication more frequently than prescribed.  Inform other Doctors: Always inform, all of your healthcare providers, of all the medications you take. Pain Medication from other Providers: You are not allowed to accept any additional pain medication  from any  other Doctor or Healthcare provider. There are two exceptions to this rule. (see below) In the event that you require additional pain medication, you are responsible for notifying us, as stated below. Cough Medicine: Often these contain an opioid, such as codeine or hydrocodone. Never accept or take cough medicine containing these opioids if you are already taking an opioid* medication. The combination may cause respiratory failure and death. Medication Agreement: You are responsible for carefully reading and following our Medication Agreement. This must be signed before receiving any prescriptions from our practice. Safely store a copy of your signed Agreement. Violations to the Agreement will result in no further prescriptions. (Additional copies of our Medication Agreement are available upon request.) Laws, Rules, & Regulations: All patients are expected to follow all [address] and [employer], Rules, [place] [employer]. Ignorance of the Laws does not constitute a valid excuse.  Illegal drugs and Controlled Substances: The use of illegal substances (including, but not limited to marijuana and its derivatives) and/or the illegal use of any controlled substances is strictly prohibited. Violation of this rule may result in the immediate and permanent discontinuation of any and all prescriptions being written by our practice. The use of any illegal substances is prohibited. Adopted CDC guidelines & recommendations: Target dosing levels will be at or below 60 MME/day. Use of benzodiazepines** is not recommended. Urine Drug testing: Patients taking controlled substances will be required to provide a urine sample upon request. Do not void before coming to your medication management appointments. Hold emptying your bladder until you are admitted. The admitting nurse will inform you if a sample is required. Our practice reserves the right to call you at any time to provide a sample. Once receiving the  call, you have 24 hours to comply with request. Not providing a sample upon request may result in termination of medication therapy.  Exceptions: There are only two exceptions to the rule of not receiving pain medications from other Healthcare Providers. Exception #1 (Emergencies): In the event of an emergency (i.e.: accident requiring emergency care), you are allowed to receive additional pain medication. However, you are responsible for: As soon as you are able, call our office [phone], at any time of the day or night, and leave a message stating your name, the date and nature of the emergency, and the name and dose of the medication prescribed. In the event that your call is answered by a member of our staff, make sure to document and save the date, time, and the name of the person that took your information.  Exception #2 (Planned Surgery): In the event that you are scheduled by another doctor or dentist to have any type of surgery or procedure, you are allowed (for a period no longer than 30 days), to receive additional pain medication, for the acute post-op pain. However, in this case, you are responsible for picking up a copy of our "Post-op Pain Management for Surgeons" handout, and giving it to your surgeon or dentist. This document is available at our office, and does not require an appointment to obtain it. Simply go to our office during business hours (Monday-Thursday from 8:00 AM to 4:00 PM) (Friday 8:00 AM to 12:00 Noon) or if you have a scheduled appointment with [place], prior to your surgery, and ask for it by name. In addition, you are responsible for: calling our office [phone], at any time of the day or night, and leaving a message stating your name, name of your surgeon,  type of surgery, and date of procedure or surgery. Failure to comply with your responsibilities may result in termination of therapy involving the controlled substances.  Consequences:  Non-compliance with the  above rules may result in permanent discontinuation of medication prescription therapy. All patients receiving any type of controlled substance is expected to comply with the above patient responsibilities. Not doing so may result in permanent discontinuation of medication prescription therapy. Medication Agreement Violation. Following the above rules, including your responsibilities will help you in avoiding a Medication Agreement Violation ("Breaking your Pain Medication Contract").  *Opioid medications include: morphine, codeine, oxycodone, oxymorphone, hydrocodone, hydromorphone, meperidine, tramadol, tapentadol, buprenorphine, fentanyl, methadone. **Benzodiazepine medications include: diazepam (Valium), alprazolam (Xanax), clonazepam (Klonopine), lorazepam (Ativan), clorazepate (Tranxene), chlordiazepoxide (Librium), estazolam (Prosom), oxazepam (Serax), temazepam (Restoril), triazolam (Halcion) (Last updated: 01/18/2023) ______________________________________________________________________      ______________________________________________________________________    Medication Recommendations and Reminders  Applies to: All patients receiving prescriptions (written and/or electronic).  Medication Rules & Regulations: You are responsible for reading, knowing, and following our "Medication Rules" document. These exist for your safety and that of others. They are not flexible and neither are we. Dismissing or ignoring them is an act of "non-compliance" that may result in complete and irreversible termination of such medication therapy. For safety reasons, "non-compliance" will not be tolerated. As with the [place] fundamental legal principle of "ignorance of the law is no defense", we will accept no excuses for not having read and knowing the content of documents provided to you by our practice.  Pharmacy of record:  Definition: This is the pharmacy where your electronic prescriptions will be  sent.  We do not endorse any particular pharmacy. It is up to you and your insurance to decide what pharmacy to use.  We do not restrict you in your choice of pharmacy. However, once we write for your prescriptions, we will NOT be re-sending more prescriptions to fix restricted supply problems created by your pharmacy, or your insurance.  The pharmacy listed in the electronic medical record should be the one where you want electronic prescriptions to be sent. If you choose to change pharmacy, simply notify our nursing staff. Changes will be made only during your regular appointments and not over the phone.  Recommendations: Keep all of your pain medications in a safe place, under lock and key, even if you live alone. We will NOT replace lost, stolen, or damaged medication. We do not accept "Police Reports" as proof of medications having been stolen. After you fill your prescription, take 1 week's worth of pills and put them away in a safe place. You should keep a separate, properly labeled bottle for this purpose. The remainder should be kept in the original bottle. Use this as your primary supply, until it runs out. Once it's gone, then you know that you have 1 week's worth of medicine, and it is time to come in for a prescription refill. If you do this correctly, it is unlikely that you will ever run out of medicine. To make sure that the above recommendation works, it is very important that you make sure your medication refill appointments are scheduled at least 1 week before you run out of medicine. To do this in an effective manner, make sure that you do not leave the office without scheduling your next medication management appointment. Always ask the nursing staff to show you in your prescription , when your medication will be running out. Then arrange for the receptionist to get you a  return appointment, at least 7 days before you run out of medicine. Do not wait until you have 1 or 2 pills left, to  come in. This is very poor planning and does not take into consideration that we may need to cancel appointments due to bad weather, sickness, or emergencies affecting our staff. DO NOT ACCEPT A "Partial Fill": If for any reason your pharmacy does not have enough pills/tablets to completely fill or refill your prescription, do not allow for a "partial fill". The law allows the pharmacy to complete that prescription within 72 hours, without requiring a new prescription. If they do not fill the rest of your prescription within those 72 hours, you will need a separate prescription to fill the remaining amount, which we will NOT provide. If the reason for the partial fill is your insurance, you will need to talk to the pharmacist about payment alternatives for the remaining tablets, but again, DO NOT ACCEPT A PARTIAL FILL, unless you can trust your pharmacist to obtain the remainder of the pills within 72 hours.  Prescription refills and/or changes in medication(s):  Prescription refills, and/or changes in dose or medication, will be conducted only during scheduled medication management appointments. (Applies to both, written and electronic prescriptions.) No refills on procedure days. No medication will be changed or started on procedure days. No changes, adjustments, and/or refills will be conducted on a procedure day. Doing so will interfere with the diagnostic portion of the procedure. No phone refills. No medications will be "called into the pharmacy". No Fax refills. No weekend refills. No Holliday refills. No after hours refills.  Remember:  Business hours are:  Monday to Thursday 8:00 AM to 4:00 PM Provider's Schedule: Delano Metz, MD - Appointments are:  Medication management: Monday and Wednesday 8:00 AM to 4:00 PM Procedure day: Tuesday and Thursday 7:30 AM to 4:00 PM Edward Jolly, MD - Appointments are:  Medication management: Tuesday and Thursday 8:00 AM to 4:00 PM Procedure day:  Monday and Wednesday 7:30 AM to 4:00 PM (Last update: 01/18/2022) ______________________________________________________________________      ______________________________________________________________________     Naloxone Nasal Spray  Why am I receiving this medication? [place] STOP ACT requires that all patients taking high dose opioids or at risk of opioids respiratory depression, be prescribed an opioid reversal agent, such as Naloxone (AKA: Narcan).  What is this medication? NALOXONE (nal OX one) treats opioid overdose, which causes slow or shallow breathing, severe drowsiness, or trouble staying awake. Call emergency services after using this medication. You may need additional treatment. Naloxone works by reversing the effects of opioids. It belongs to a group of medications called opioid blockers.  COMMON BRAND NAME(S): Kloxxado, Narcan  What should I tell my care team before I take this medication? They need to know if you have any of these conditions: Heart disease Substance use disorder An unusual or allergic reaction to naloxone, other medications, foods, dyes, or preservatives Pregnant or trying to get pregnant Breast-feeding  When to use this medication? This medication is to be used for the treatment of respiratory depression (less than 8 breaths per minute) secondary to opioid overdose.   How to use this medication? This medication is for use in the nose. Lay the person on their back. Support their neck with your hand and allow the head to tilt back before giving the medication. The nasal spray should be given into 1 nostril. After giving the medication, move the person onto their side. Do not remove or  test the nasal spray until ready to use. Get emergency medical help right away after giving the first dose of this medication, even if the person wakes up. You should be familiar with how to recognize the signs and symptoms of a narcotic overdose. If more doses  are needed, give the additional dose in the other nostril. Talk to your care team about the use of this medication in children. While this medication may be prescribed for children as young as newborns for selected conditions, precautions do apply.  Naloxone Overdosage: If you think you have taken too much of this medicine contact a poison control center or emergency room at once.  NOTE: This medicine is only for you. Do not share this medicine with others.  What if I miss a dose? This does not apply.  What may interact with this medication? This is only used during an emergency. No interactions are expected during emergency use. This list may not describe all possible interactions. Give your health care provider a list of all the medicines, herbs, non-prescription drugs, or dietary supplements you use. Also tell them if you smoke, drink alcohol, or use illegal drugs. Some items may interact with your medicine.  What should I watch for while using this medication? Keep this medication ready for use in the case of an opioid overdose. Make sure that you have the phone number of your care team and local hospital ready. You may need to have additional doses of this medication. Each nasal spray contains a single dose. Some emergencies may require additional doses. After use, bring the treated person to the nearest hospital or call 911. Make sure the treating care team knows that the person has received a dose of this medication. You will receive additional instructions on what to do during and after use of this medication before an emergency occurs.  What side effects may I notice from receiving this medication? Side effects that you should report to your care team as soon as possible: Allergic reactions--skin rash, itching, hives, swelling of the face, lips, tongue, or throat Side effects that usually do not require medical attention (report these to your care team if they continue or are  bothersome): Constipation Dryness or irritation inside the nose Headache Increase in blood pressure Muscle spasms Stuffy nose Toothache This list may not describe all possible side effects. Call your doctor for medical advice about side effects. You may report side effects to FDA at 1-800-FDA-1088.  Where should I keep my medication? Because this is an emergency medication, you should keep it with you at all times.  Keep out of the reach of children and pets. Store between 20 and 25 degrees C (68 and 77 degrees F). Do not freeze. Throw away any unused medication after the expiration date. Keep in original box until ready to use.  NOTE: This sheet is a summary. It may not cover all possible information. If you have questions about this medicine, talk to your doctor, pharmacist, or health care provider.   2023 Elsevier/Gold Standard (2020-11-20 00:00:00)  ______________________________________________________________________      ______________________________________________________________________    Opioid Pain Medication Update  To: All patients taking opioid pain medications. (I.e.: hydrocodone, hydromorphone, oxycodone, oxymorphone, morphine, codeine, methadone, tapentadol, tramadol, buprenorphine, fentanyl, etc.)  Re: Updated review of side effects and adverse reactions of opioid analgesics, as well as new information about long term effects of this class of medications.  Direct risks of long-term opioid therapy are not limited to opioid addiction and  overdose. Potential medical risks include serious fractures, breathing problems during sleep, hyperalgesia, immunosuppression, chronic constipation, bowel obstruction, myocardial infarction, and tooth decay secondary to xerostomia.  Unpredictable adverse effects that can occur even if you take your medication correctly: Cognitive impairment, respiratory depression, and death. Most people think that if they take their medication  "correctly", and "as instructed", that they will be safe. Nothing could be farther from the truth. In reality, a significant amount of recorded deaths associated with the use of opioids has occurred in individuals that had taken the medication for a long time, and were taking their medication correctly. The following are examples of how this can happen: Patient taking his/her medication for a long time, as instructed, without any side effects, is given a certain antibiotic or another unrelated medication, which in turn triggers a "Drug-to-drug interaction" leading to disorientation, cognitive impairment, impaired reflexes, respiratory depression or an untoward event leading to serious bodily harm or injury, including death.  Patient taking his/her medication for a long time, as instructed, without any side effects, develops an acute impairment of liver and/or kidney function. This will lead to a rapid inability of the body to breakdown and eliminate their pain medication, which will result in effects similar to an "overdose", but with the same medicine and dose that they had always taken. This again may lead to disorientation, cognitive impairment, impaired reflexes, respiratory depression or an untoward event leading to serious bodily harm or injury, including death.  A similar problem will occur with patients as they grow older and their liver and kidney function begins to decrease as part of the aging process.  Background information: Historically, the original case for using long-term opioid therapy to treat chronic noncancer pain was based on safety assumptions that subsequent experience has called into question. In 1996, the American Pain Society and the American Academy of Pain Medicine issued a consensus statement supporting long-term opioid therapy. This statement acknowledged the dangers of opioid prescribing but concluded that the risk for addiction was low; respiratory depression induced by opioids  was short-lived, occurred mainly in opioid-naive patients, and was antagonized by pain; tolerance was not a common problem; and efforts to control diversion should not constrain opioid prescribing. This has now proven to be wrong. Experience regarding the risks for opioid addiction, misuse, and overdose in community practice has failed to support these assumptions.  According to the Centers for Disease Control and Prevention, fatal overdoses involving opioid analgesics have increased sharply over the past decade. Currently, more than 96,700 people die from drug overdoses every year. Opioids are a factor in 7 out of every 10 overdose deaths. Deaths from drug overdose have surpassed motor vehicle accidents as the leading cause of death for individuals between the ages of 96 and 79.  Clinical data suggest that neuroendocrine dysfunction may be very common in both men and women, potentially causing hypogonadism, erectile dysfunction, infertility, decreased libido, osteoporosis, and depression. Recent studies linked higher opioid dose to increased opioid-related mortality. Controlled observational studies reported that long-term opioid therapy may be associated with increased risk for cardiovascular events. Subsequent meta-analysis concluded that the safety of long-term opioid therapy in elderly patients has not been proven.   Side Effects and adverse reactions: Common side effects: Drowsiness (sedation). Dizziness. Nausea and vomiting. Constipation. Physical dependence -- Dependence often manifests with withdrawal symptoms when opioids are discontinued or decreased. Tolerance -- As you take repeated doses of opioids, you require increased medication to experience the same effect of pain relief. Respiratory  depression -- This can occur in healthy people, especially with higher doses. However, people with COPD, asthma or other lung conditions may be even more susceptible to fatal respiratory  impairment.  Uncommon side effects: An increased sensitivity to feeling pain and extreme response to pain (hyperalgesia). Chronic use of opioids can lead to this. Delayed gastric emptying (the process by which the contents of your stomach are moved into your small intestine). Muscle rigidity. Immune system and hormonal dysfunction. Quick, involuntary muscle jerks (myoclonus). Arrhythmia. Itchy skin (pruritus). Dry mouth (xerostomia).  Long-term side effects: Chronic constipation. Sleep-disordered breathing (SDB). Increased risk of bone fractures. Hypothalamic-pituitary-adrenal dysregulation. Increased risk of overdose.  RISKS: Respiratory depression and death: Opioids increase the risk of respiratory depression and death.  Drug-to-drug interactions: Opioids are relatively contraindicated in combination with benzodiazepines, sleep inducers, and other central nervous system depressants. Other classes of medications (i.e.: certain antibiotics and even over-the-counter medications) may also trigger or induce respiratory depression in some patients.  Medical conditions: Patients with pre-existing respiratory problems are at higher risk of respiratory failure and/or depression when in combination with opioid analgesics. Opioids are relatively contraindicated in some medical conditions such as central sleep apnea.   Fractures and Falls:  Opioids increase the risk and incidence of falls. This is of particular importance in elderly patients.  Endocrine System:  Long-term administration is associated with endocrine abnormalities (endocrinopathies). (Also known as Opioid-induced Endocrinopathy) Influences on both the hypothalamic-pituitary-adrenal axis?and the hypothalamic-pituitary-gonadal axis have been demonstrated with consequent hypogonadism and adrenal insufficiency in both sexes. Hypogonadism and decreased levels of dehydroepiandrosterone sulfate have been reported in men and  women. Endocrine effects include: Amenorrhoea in women (abnormal absence of menstruation) Reduced libido in both sexes Decreased sexual function Erectile dysfunction in men Hypogonadisms (decreased testicular function with shrinkage of testicles) Infertility Depression and fatigue Loss of muscle mass Anxiety Depression Immune suppression Hyperalgesia Weight gain Anemia Osteoporosis Patients (particularly women of childbearing age) should avoid opioids. There is insufficient evidence to recommend routine monitoring of asymptomatic patients taking opioids in the long-term for hormonal deficiencies.  Immune System: Human studies have demonstrated that opioids have an immunomodulating effect. These effects are mediated via opioid receptors both on immune effector cells and in the central nervous system. Opioids have been demonstrated to have adverse effects on antimicrobial response and anti-tumour surveillance. Buprenorphine has been demonstrated to have no impact on immune function.  Opioid Induced Hyperalgesia: Human studies have demonstrated that prolonged use of opioids can lead to a state of abnormal pain sensitivity, sometimes called opioid induced hyperalgesia (OIH). Opioid induced hyperalgesia is not usually seen in the absence of tolerance to opioid analgesia. Clinically, hyperalgesia may be diagnosed if the patient on long-term opioid therapy presents with increased pain. This might be qualitatively and anatomically distinct from pain related to disease progression or to breakthrough pain resulting from development of opioid tolerance. Pain associated with hyperalgesia tends to be more diffuse than the pre-existing pain and less defined in quality. Management of opioid induced hyperalgesia requires opioid dose reduction.  Cancer: Chronic opioid therapy has been associated with an increased risk of cancer among noncancer patients with chronic pain. This association was more  evident in chronic strong opioid users. Chronic opioid consumption causes significant pathological changes in the small intestine and colon. Epidemiological studies have found that there is a link between opium dependence and initiation of gastrointestinal cancers. Cancer is the second leading cause of death after cardiovascular disease. Chronic use of opioids can cause multiple conditions such as  GERD, immunosuppression and renal damage as well as carcinogenic effects, which are associated with the incidence of cancers.   Mortality: Long-term opioid use has been associated with increased mortality among patients with chronic non-cancer pain (CNCP).  Prescription of long-acting opioids for chronic noncancer pain was associated with a significantly increased risk of all-cause mortality, including deaths from causes other than overdose.  Reference: Von Korff M, Kolodny A, Deyo RA, Chou R. Long-term opioid therapy reconsidered. Ann Intern Med. 2011 Sep 6;155(5):325-8. doi: 10.7326/[id number]. PMID: 16109604; PMCID: [id number]. Randon Goldsmith, Hayward RA, Dunn KM, [place] KP. Risk of adverse events in patients prescribed long-term opioids: A cohort study in the [place] [facility] [employer] Datalink. Eur J Pain. 2019 May;23(5):908-922. doi: 10.1002/ejp.1357. Epub 2019 Jan 31. PMID: 78295621. Colameco S, Coren JS, Ciervo CA. Continuous opioid treatment for chronic noncancer pain: a time for moderation in prescribing. Postgrad Med. 2009 Jul;121(4):61-6. doi: 10.3810/pgm.2009.07.2032. PMID: 30865784. William Hamburger RN, Wamsutter SD, Blazina I, Cristopher Peru, Bougatsos C, Deyo RA. The effectiveness and risks of long-term opioid therapy for chronic pain: a systematic review for a [employer] to [employer]. Ann Intern Med. 2015 Feb 17;162(4):276-86. doi: 10.7326/M14-2559. PMID: 69629528. Caryl Bis [facility], Makuc DM. NCHS Data Brief  No. 22. [place]: Centers for Disease Control and Prevention; 2009. Sep, Increase in Fatal Poisonings Involving Opioid Analgesics in the [place], 1999-2006. Song IA, Choi HR, Oh TK. Long-term opioid use and mortality in patients with chronic non-cancer pain: Ten-year follow-up study in [place] from 2010 through 2019. EClinicalMedicine. 2022 Jul 18;51:101558. doi: 10.1016/j.eclinm.2022.413244. PMID: 01027253; PMCID: [id number]. Huser, W., Schubert, T., Vogelmann, T. et al. All-cause mortality in patients with long-term opioid therapy compared with non-opioid analgesics for chronic non-cancer pain: a database study. BMC Med 18, 162 (2020). http://lester.info/ Rashidian H, Karie Kirks, Malekzadeh R, Haghdoost AA. An Ecological Study of the Association between Opiate Use and Incidence of Cancers. Addict Health. 2016 Fall;8(4):252-260. PMID: 59563875; PMCID: IEP3295188.  Our Goal: Our goal is to control your pain with means other than the use of opioid pain medications.  Our Recommendation: Talk to your physician about coming off of these medications. We can assist you with the tapering down and stopping these medicines. Based on the new information, even if you cannot completely stop the medication, a decrease in the dose may be associated with a lesser risk. Ask for other means of controlling the pain. Decrease or eliminate those factors that significantly contribute to your pain such as smoking, obesity, and a diet heavily tilted towards "inflammatory" nutrients.  Last Updated: 10/03/2022   ______________________________________________________________________       ______________________________________________________________________    National Pain Medication Shortage  The [place] is experiencing worsening drug shortages. These have had a negative widespread effect on patient care and treatment. Not expected to improve any time soon. Predicted to last  past 2029.   Drug shortage list (generic names) Oxycodone IR Oxycodone/APAP Oxymorphone IR Hydromorphone Hydrocodone/APAP Morphine  Where is the problem?  Manufacturing and supply level.  Will this shortage affect you?  Only if you take any of the above pain medications.  How? You may be unable to fill your prescription.  Your pharmacist may offer a "partial fill" of your prescription. (Warning: Do not accept partial fills.) Prescriptions partially filled cannot be transferred to another pharmacy. Read our Medication Rules and Regulation. Depending on how much medicine you are dependent on, you may experience withdrawals when  unable to get the medication.  Recommendations: Consider ending your dependence on opioid pain medications. Ask your pain specialist to assist you with the process. Consider switching to a medication currently not in shortage, such as Buprenorphine. Talk to your pain specialist about this option. Consider decreasing your pain medication requirements by managing tolerance thru "Drug Holidays". This may help minimize withdrawals, should you run out of medicine. Control your pain thru the use of non-pharmacological interventional therapies.   Your prescriber: Prescribers cannot be blamed for shortages. Medication manufacturing and supply issues cannot be fixed by the prescriber.   NOTE: The prescriber is not responsible for supplying the medication, or solving supply issues. Work with your pharmacist to solve it. The patient is responsible for the decision to take or continue taking the medication and for identifying and securing a legal supply source. By law, supplying the medication is the job and responsibility of the pharmacy. The prescriber is responsible for the evaluation, monitoring, and prescribing of these medications.   Prescribers will NOT: Re-issue prescriptions that have been partially filled. Re-issue prescriptions already sent to a pharmacy.   Re-send prescriptions to a different pharmacy because yours did not have your medication. Ask pharmacist to order more medicine or transfer the prescription to another pharmacy. (Read below.)  New 2023 regulation: "November 26, 2021 Revised Regulation Allows DEA-Registered Pharmacies to Transfer Electronic Prescriptions at a Patient's Request DEA Headquarters Division - Public Information Office Patients now have the ability to request their electronic prescription be transferred to another pharmacy without having to go back to their practitioner to initiate the request. This revised regulation went into effect on Monday, November 22, 2021.     At a patient's request, a DEA-registered retail pharmacy can now transfer an electronic prescription for a controlled substance (schedules II-V) to another DEA-registered retail pharmacy. Prior to this change, patients would have to go through their practitioner to cancel their prescription and have it re-issued to a different pharmacy. The process was taxing and time consuming for both patients and practitioners.    The Drug Enforcement Administration [facility]) published its intent to revise the process for transferring electronic prescriptions on February 14, 2020.  The final rule was published in the federal register on October 21, 2021 and went into effect 30 days later.  Under the final rule, a prescription can only be transferred once between pharmacies, and only if allowed under existing state or other applicable law. The prescription must remain in its electronic form; may not be altered in any way; and the transfer must be communicated directly between two licensed pharmacists. It's important to note, any authorized refills transfer with the original prescription, which means the entire prescription will be filled at the same pharmacy".  Reference: HugeHand.is  [facility] website announcement)  CheapWipes.at.pdf Financial planner of Justice)   [employer] / Vol. 88, No. 143 / Thursday, October 21, 2021 / Rules and Regulations DEPARTMENT OF JUSTICE  Drug Enforcement Administration  21 CFR Part 1306  [Docket No. [id number]]  RIN [id number] Transfer of Electronic Prescriptions for Schedules II-V Controlled Substances Between Pharmacies for Initial Filling  ______________________________________________________________________       ______________________________________________________________________    Transfer of Pain Medication between Pharmacies  Re: 2023 DEA Clarification on existing regulation  Published on DEA Website: November 26, 2021  Title: Revised Regulation Allows DEA-Registered Pharmacies to Electrical engineer Prescriptions at a Patient's Request DEA Headquarters Division - [employer]  "Patients now have the ability to request their  electronic prescription be transferred to another pharmacy without having to go back to their practitioner to initiate the request. This revised regulation went into effect on Monday, November 22, 2021.     At a patient's request, a DEA-registered retail pharmacy can now transfer an electronic prescription for a controlled substance (schedules II-V) to another DEA-registered retail pharmacy. Prior to this change, patients would have to go through their practitioner to cancel their prescription and have it re-issued to a different pharmacy. The process was taxing and time consuming for both patients and practitioners.    The Drug Enforcement Administration [facility]) published its intent to revise the process for transferring electronic prescriptions on February 14, 2020.  The final rule was published in the federal register on October 21, 2021 and went into effect 30 days later.  Under the final rule, a prescription can only be transferred  once between pharmacies, and only if allowed under existing state or other applicable law. The prescription must remain in its electronic form; may not be altered in any way; and the transfer must be communicated directly between two licensed pharmacists. It's important to note, any authorized refills transfer with the original prescription, which means the entire prescription will be filled at the same pharmacy."    REFERENCES: 1. DEA website announcement HugeHand.is  2. Department of Justice website  CheapWipes.at.pdf  3. DEPARTMENT OF JUSTICE Drug Enforcement Administration 21 CFR Part 1306 [Docket No. [id number]] RIN [id number] "Transfer of Electronic Prescriptions for Schedules II-V Controlled Substances Between Pharmacies for Initial Filling"  ______________________________________________________________________

## 2023-03-03 LAB — TOXASSURE SELECT 13 (MW), URINE

## 2023-03-28 ENCOUNTER — Other Ambulatory Visit: Payer: Self-pay | Admitting: Internal Medicine

## 2023-03-28 DIAGNOSIS — H401131 Primary open-angle glaucoma, bilateral, mild stage: Secondary | ICD-10-CM | POA: Diagnosis not present

## 2023-03-28 DIAGNOSIS — R252 Cramp and spasm: Secondary | ICD-10-CM

## 2023-03-28 DIAGNOSIS — H25813 Combined forms of age-related cataract, bilateral: Secondary | ICD-10-CM | POA: Diagnosis not present

## 2023-03-28 DIAGNOSIS — H0102B Squamous blepharitis left eye, upper and lower eyelids: Secondary | ICD-10-CM | POA: Diagnosis not present

## 2023-03-28 DIAGNOSIS — M7918 Myalgia, other site: Secondary | ICD-10-CM

## 2023-03-28 DIAGNOSIS — H0102A Squamous blepharitis right eye, upper and lower eyelids: Secondary | ICD-10-CM | POA: Diagnosis not present

## 2023-05-03 ENCOUNTER — Ambulatory Visit: Payer: BC Managed Care – PPO | Admitting: Internal Medicine

## 2023-05-03 ENCOUNTER — Encounter: Payer: Self-pay | Admitting: Internal Medicine

## 2023-05-03 VITALS — BP 126/80 | HR 63 | Temp 98.1°F | Ht 70.0 in | Wt 234.0 lb

## 2023-05-03 DIAGNOSIS — R030 Elevated blood-pressure reading, without diagnosis of hypertension: Secondary | ICD-10-CM

## 2023-05-03 DIAGNOSIS — M7918 Myalgia, other site: Secondary | ICD-10-CM

## 2023-05-03 DIAGNOSIS — Z0001 Encounter for general adult medical examination with abnormal findings: Secondary | ICD-10-CM | POA: Diagnosis not present

## 2023-05-03 DIAGNOSIS — G8929 Other chronic pain: Secondary | ICD-10-CM

## 2023-05-03 DIAGNOSIS — K219 Gastro-esophageal reflux disease without esophagitis: Secondary | ICD-10-CM

## 2023-05-03 DIAGNOSIS — R7301 Impaired fasting glucose: Secondary | ICD-10-CM

## 2023-05-03 DIAGNOSIS — M5410 Radiculopathy, site unspecified: Secondary | ICD-10-CM

## 2023-05-03 LAB — COMPREHENSIVE METABOLIC PANEL
ALT: 24 U/L (ref 0–53)
AST: 21 U/L (ref 0–37)
Albumin: 4.5 g/dL (ref 3.5–5.2)
Alkaline Phosphatase: 73 U/L (ref 39–117)
BUN: 22 mg/dL (ref 6–23)
CO2: 26 meq/L (ref 19–32)
Calcium: 9.2 mg/dL (ref 8.4–10.5)
Chloride: 105 meq/L (ref 96–112)
Creatinine, Ser: 0.83 mg/dL (ref 0.40–1.50)
GFR: 94.5 mL/min (ref >60.00)
Glucose, Bld: 99 mg/dL (ref 70–99)
Potassium: 4.5 meq/L (ref 3.5–5.1)
Sodium: 140 meq/L (ref 135–145)
Total Bilirubin: 0.4 mg/dL (ref 0.2–1.2)
Total Protein: 6.9 g/dL (ref 6.0–8.3)

## 2023-05-03 LAB — CBC
HCT: 43.2 % (ref 39.0–52.0)
Hemoglobin: 14.3 g/dL (ref 13.0–17.0)
MCHC: 33.1 g/dL (ref 30.0–36.0)
MCV: 91.5 fL (ref 78.0–100.0)
Platelets: 224 10*3/uL (ref 150.0–400.0)
RBC: 4.72 Mil/uL (ref 4.22–5.81)
RDW: 13.5 % (ref 11.5–15.5)
WBC: 5.3 10*3/uL (ref 4.0–10.5)

## 2023-05-03 LAB — HEMOGLOBIN A1C: Hgb A1c MFr Bld: 5.9 % (ref 4.6–6.5)

## 2023-05-03 LAB — LIPID PANEL
Cholesterol: 162 mg/dL (ref 0–200)
HDL: 46.3 mg/dL (ref >39.00)
LDL Cholesterol: 102 mg/dL — ABNORMAL HIGH (ref 0–99)
NonHDL: 115.51
Total CHOL/HDL Ratio: 3
Triglycerides: 67 mg/dL (ref 0.0–149.0)
VLDL: 13.4 mg/dL (ref 0.0–40.0)

## 2023-05-03 MED ORDER — BACLOFEN 10 MG PO TABS: 10.0000 mg | ORAL_TABLET | Freq: Every day | ORAL | 3 refills | Status: DC

## 2023-05-03 MED ORDER — BACLOFEN 10 MG PO TABS: 10.0000 mg | ORAL_TABLET | Freq: Every day | ORAL | 3 refills | Status: AC

## 2023-05-03 NOTE — Assessment & Plan Note (Signed)
 Checking HGA1c and adjust as needed.

## 2023-05-03 NOTE — Assessment & Plan Note (Signed)
 BP is at goal will continue close monitoring. Checking CMP and lipid panel and CBC.

## 2023-05-03 NOTE — Assessment & Plan Note (Signed)
Taking nexium daily otc and controlled.

## 2023-05-03 NOTE — Progress Notes (Signed)
   Subjective:   Patient ID: Lance Roman, male    DOB: 05/23/1960, 63 y.o.   MRN: [id number]  HPI The patient is here for physical. He has stopped opioids since our last visit and is feeling about the same pain wise and less side effects.   PMH, [facility], social history reviewed and updated  Review of Systems  Constitutional: Negative.   HENT: Negative.    Eyes: Negative.   Respiratory:  Negative for cough, chest tightness and shortness of breath.   Cardiovascular:  Negative for chest pain, palpitations and leg swelling.  Gastrointestinal:  Negative for abdominal distention, abdominal pain, constipation, diarrhea, nausea and vomiting.  Musculoskeletal:  Positive for back pain.  Skin: Negative.   Neurological: Negative.   Psychiatric/Behavioral: Negative.      Objective:  Physical Exam Constitutional:      Appearance: He is well-developed.  HENT:     Head: Normocephalic and atraumatic.  Cardiovascular:     Rate and Rhythm: Normal rate and regular rhythm.  Pulmonary:     Effort: Pulmonary effort is normal. No respiratory distress.     Breath sounds: Normal breath sounds. No wheezing or rales.  Abdominal:     General: Bowel sounds are normal. There is no distension.     Palpations: Abdomen is soft.     Tenderness: There is no abdominal tenderness. There is no rebound.  Musculoskeletal:     Cervical back: Normal range of motion.  Skin:    General: Skin is warm and dry.  Neurological:     Mental Status: He is alert and oriented to person, place, and time.     Coordination: Coordination normal.     Vitals:   05/03/23 0847  BP: 126/80  Pulse: 63  Temp: 98.1 F (36.7 C)  TempSrc: Oral  SpO2: 98%  Weight: 234 lb (106.1 kg)  Height: 5' 10 (1.778 m)    Assessment & Plan:

## 2023-05-03 NOTE — Assessment & Plan Note (Signed)
 Flu shot declines. Shingrix declines. Tetanus up to date. Cologuard declines. Counseled about sun safety and mole surveillance. Counseled about the dangers of distracted driving. Given 10 year screening recommendations.

## 2023-05-03 NOTE — Assessment & Plan Note (Signed)
Taking ibuprofen and tylenol and baclofen only for pain now. Has weaned himself off oxycodone. Pain level is about the same but less side effects.

## 2023-05-05 ENCOUNTER — Encounter: Payer: Self-pay | Admitting: Internal Medicine

## 2023-05-23 NOTE — Progress Notes (Deleted)
 PROVIDER NOTE: Information contained herein reflects review and annotations entered in association with encounter. Interpretation of such information and data should be left to medically-trained personnel. Information provided to patient can be located elsewhere in the medical record under "Patient Instructions". Document created using STT-dictation technology, any transcriptional errors that may result from process are unintentional.    Patient: Lance Roman  Service Category: E/M  Provider: Oswaldo Done, MD  DOB: [birth year]  DOS: 05/24/2023  Referring Provider: Myrlene Broker, *  MRN: [id number]  Specialty: Interventional Pain Management  PCP: Myrlene Broker, MD  Type: Established Patient  Setting: Ambulatory outpatient    Location: Office  Delivery: Face-to-face     HPI  Mr. NAIF ALABI, a 63 y.o. year old male, is here today because of his No primary diagnosis found.. Mr. Laskaris primary complain today is No chief complaint on file.  Pertinent problems: Mr. Sahagun has Chronic lumbar radicular pain (L5) (Left); Spinal cord stimulator dysfunction (fractured leads); Chronic knee pain (Left); Epidural fibrosis; and Chronic midline thoracic back pain on their pertinent problem list. Pain Assessment: Severity of   is reported as a  /10. Location:    / . Onset:  . Quality:  . Timing:  . Modifying factor(s):  Marland Kitchen Vitals:  vitals were not taken for this visit.  BMI: Estimated body mass index is 33.58 kg/m as calculated from the following:   Height as of 05/03/23: 5\' 10"  (1.778 m).   Weight as of 05/03/23: 234 lb (106.1 kg). Last encounter: 03/01/2023. Last procedure: Visit date not found.  Reason for encounter: medication management.  Into the electronic medical record, he is no longer taking the opioid analgesics.  They have been removed from his medication list.  In reviewing his PMP, his last prescription was filled on 03/01/2023.  If he truly has been off of the medications, we  need to take this opportunity to evaluate to see if it really needs to be restarted.  If so, we need to consider the possibility of switching him to the buprenorphine patches. ***  Discussed the use of AI scribe software for clinical note transcription with the patient, who gave verbal consent to proceed.  History of Present Illness         RTCB:   Pharmacotherapy Assessment  Analgesic: Oxycodone IR 5 mg, 1 tab PO q 6 hrs (20 mg/day of oxycodone) + OxyContin 10 mg, 1 tab p.o. twice daily (20 mg/day of oxycodone) (total of 40 mg/day of oxycodone) MME/day: 60 mg/day.   Monitoring: [place] PMP: PDMP reviewed during this encounter.       Pharmacotherapy: No side-effects or adverse reactions reported. Compliance: No problems identified. Effectiveness: Clinically acceptable.  No notes on file  No results found for: "CBDTHCR" No results found for: "D8THCCBX" No results found for: "D9THCCBX"  UDS:  Summary  Date Value Ref Range Status  03/01/2023 FINAL  Final    Comment:    ==================================================================== [employer] [facility] [employer] (MW) ==================================================================== Test                             Result       Flag       Units  Drug Present and Declared for Prescription Verification   Noroxycodone                   163          EXPECTED   ng/mg  creat    Noroxycodone is an expected metabolite of oxycodone. Sources of    oxycodone include scheduled prescription medications.  Drug Absent but Declared for Prescription Verification   Oxycodone                      Not Detected UNEXPECTED ng/mg creat    Oxycodone is almost always present in patients taking this drug    consistently.  Absence of oxycodone could be due to lapse of time    since the last dose or unusual pharmacokinetics (rapid metabolism).  ==================================================================== Test                      Result    Flag   Units       Ref Range   Creatinine              89               mg/dL      >=95 ==================================================================== Declared Medications:  The flagging and interpretation on this report are based on the  following declared medications.  Unexpected results may arise from  inaccuracies in the declared medications.   **Note: The testing scope of this panel includes these medications:   Oxycodone (Roxicodone)   **Note: The testing scope of this panel does not include the  following reported medications:   Baclofen (Lioresal)  Esomeprazole (Nexium)  Eye Drops  Ibuprofen (Advil)  Naloxone (Narcan) ==================================================================== For clinical consultation, please call [phone]. ====================================================================       ROS  Constitutional: Denies any fever or chills Gastrointestinal: No reported hemesis, hematochezia, vomiting, or acute GI distress Musculoskeletal: Denies any acute onset joint swelling, redness, loss of ROM, or weakness Neurological: No reported episodes of acute onset apraxia, aphasia, dysarthria, agnosia, amnesia, paralysis, loss of coordination, or loss of consciousness  Medication Review  baclofen, esomeprazole, ibuprofen, and latanoprost  History Review  Allergy: Mr. Shropshire has no known allergies. Drug: Mr. Tuller  reports no history of drug use. Alcohol:  reports current alcohol use of about 3.0 standard drinks of alcohol per week. Tobacco:  reports that he has quit smoking. His smokeless tobacco use includes chew. Social: Mr. Haque  reports that he has quit smoking. His smokeless tobacco use includes chew. He reports current alcohol use of about 3.0 standard drinks of alcohol per week. He reports that he does not use drugs. Medical:  has a past medical history of Acute postoperative pain (11/21/2017), Allergic rhinitis, Chronic pain syndrome, DDD  (degenerative disc disease), lumbosacral, GERD (gastroesophageal reflux disease), Hyperlipidemia, Osteoarthritis, and S/P appendectomy. Surgical: Mr. Gillooly  has a past surgical history that includes Appendectomy; repairs of bilateral ankle fractures; Spinal fusion (N/A); and Spinal cord stimulator implant (N/A). Family: family history includes Diabetes in his maternal grandmother; Heart disease in his father.  Laboratory Chemistry Profile   Renal Lab Results  Component Value Date   BUN 22 05/03/2023   CREATININE 0.83 05/03/2023   BCR 27 (H) 12/12/2016   GFR 94.50 05/03/2023   GFRAA 120 12/12/2016   GFRNONAA 104 12/12/2016    Hepatic Lab Results  Component Value Date   AST 21 05/03/2023   ALT 24 05/03/2023   ALBUMIN 4.5 05/03/2023   ALKPHOS 73 05/03/2023   HCVAB NEGATIVE 12/30/2014    Electrolytes Lab Results  Component Value Date   NA 140 05/03/2023   K 4.5 05/03/2023   CL 105 05/03/2023   CALCIUM 9.2 05/03/2023  MG 1.9 12/12/2016    Bone Lab Results  Component Value Date   25OHVITD1 44 12/12/2016   25OHVITD2 <1.0 12/12/2016   25OHVITD3 43 12/12/2016    Inflammation (CRP: Acute Phase) (ESR: Chronic Phase) Lab Results  Component Value Date   CRP 2.5 12/12/2016   ESRSEDRATE 10 12/12/2016         Note: Above Lab results reviewed.  Recent Imaging Review  DG PAIN CLINIC C-ARM 1-60 MIN NO REPORT Fluoro was used, but no Radiologist interpretation will be provided.  Please refer to "NOTES" tab for provider progress note. Note: Reviewed        Physical Exam  General appearance: Well nourished, well developed, and well hydrated. In no apparent acute distress Mental status: Alert, oriented x 3 (person, place, & time)       Respiratory: No evidence of acute respiratory distress Eyes: PERLA Vitals: There were no vitals taken for this visit. BMI: Estimated body mass index is 33.58 kg/m as calculated from the following:   Height as of 05/03/23: 5\' 10"  (1.778 m).    Weight as of 05/03/23: 234 lb (106.1 kg). Ideal: Patient weight not recorded  Assessment   Diagnosis Status  No diagnosis found. Controlled Controlled Controlled   Updated Problems: No problems updated.  Plan of Care  Problem-specific:  Assessment and Plan            Mr. Saunders [place] Flood has a current medication list which includes the following long-term medication(s): baclofen and esomeprazole.  Pharmacotherapy (Medications Ordered): No orders of the defined types were placed in this encounter.  Orders:  No orders of the defined types were placed in this encounter.  Follow-up plan:   No follow-ups on file.      Interventional Therapies  Risk  Complexity Considerations:     NOTE: Poor candidate for RFA 2ry to Hardware   Planned  Pending:      Under consideration:   Diagnostic/therapeutic midline thoracic TPI/MNB #1  Therapeutic left caudal ESI #2  Diagnostic/therapeutic left L2-3 LESI #1  Diagnostic bilateral thoracolumbar facet MBB (L1-2 through L3-4) #1  Diagnostic/therapeutic bilateral L1-2 and L2-3 transforaminal ESI #1  Possible Racz procedure #1    Completed:   Diagnostic/therapeutic bilateral lumbar facet MBB x3 (04/27/2021) (100/100/50/70)  Palliative right lumbar facet RFA x2 (03/29/2022) (100/100/50/50)  Palliative left lumbar facet RFA x2 (03/03/2022) (100/100/75/75) w/ improved function and ROM Therapeutic left caudal ESI x1 (05/14/2019) (100/100/50/100) (100% relief of LEP as of 05/26/2021) (743 days) Therapeutic bilateral erector spinae & quadratus lumborum muscle MNB/TPI x2 (04/10/2018) (100/100/0/0)   Therapeutic  Palliative (PRN) options:   Therapeutic/palliative bilateral lumbar facet MBB    Pharmacotherapy  Nonopioids transferred 02/05/2020: Baclofen       Recent Visits Date Type Provider Dept  03/01/23 Office Visit Delano Metz, MD [facility]  Showing recent visits within past 90 days and meeting all other  requirements Future Appointments Date Type Provider Dept  05/24/23 Appointment Delano Metz, MD [facility]  Showing future appointments within next 90 days and meeting all other requirements  I discussed the assessment and treatment plan with the patient. The patient was provided an opportunity to ask questions and all were answered. The patient agreed with the plan and demonstrated an understanding of the instructions.  Patient advised to call back or seek an in-person evaluation if the symptoms or condition worsens.  Duration of encounter: *** minutes.  Total time on encounter, as per AMA guidelines included both the face-to-face  and non-face-to-face time personally spent by the physician and/or other qualified health care professional(s) on the day of the encounter (includes time in activities that require the physician or other qualified health care professional and does not include time in activities normally performed by clinical staff). Physician's time may include the following activities when performed: Preparing to see the patient (e.g., pre-charting review of records, searching for previously ordered imaging, lab work, and nerve conduction tests) Review of prior analgesic pharmacotherapies. Reviewing PMP Interpreting ordered tests (e.g., lab work, imaging, nerve conduction tests) Performing post-procedure evaluations, including interpretation of diagnostic procedures Obtaining and/or reviewing separately obtained history Performing a medically appropriate examination and/or evaluation Counseling and educating the patient/family/caregiver Ordering medications, tests, or procedures Referring and communicating with other health care professionals (when not separately reported) Documenting clinical information in the electronic or other health record Independently interpreting results (not separately reported) and communicating results to the patient/  family/caregiver Care coordination (not separately reported)  Note by: Oswaldo Done, MD Date: 05/24/2023; Time: 12:18 PM

## 2023-05-24 ENCOUNTER — Encounter: Payer: BC Managed Care – PPO | Admitting: Pain Medicine

## 2023-09-25 DIAGNOSIS — H0102A Squamous blepharitis right eye, upper and lower eyelids: Secondary | ICD-10-CM | POA: Diagnosis not present

## 2023-09-25 DIAGNOSIS — H0102B Squamous blepharitis left eye, upper and lower eyelids: Secondary | ICD-10-CM | POA: Diagnosis not present

## 2023-09-25 DIAGNOSIS — H25813 Combined forms of age-related cataract, bilateral: Secondary | ICD-10-CM | POA: Diagnosis not present

## 2023-09-25 DIAGNOSIS — H401131 Primary open-angle glaucoma, bilateral, mild stage: Secondary | ICD-10-CM | POA: Diagnosis not present

## 2024-01-29 DIAGNOSIS — M51361 Other intervertebral disc degeneration, lumbar region with lower extremity pain only: Secondary | ICD-10-CM | POA: Diagnosis not present

## 2024-01-29 DIAGNOSIS — M9905 Segmental and somatic dysfunction of pelvic region: Secondary | ICD-10-CM | POA: Diagnosis not present

## 2024-01-29 DIAGNOSIS — M9903 Segmental and somatic dysfunction of lumbar region: Secondary | ICD-10-CM | POA: Diagnosis not present

## 2024-01-29 DIAGNOSIS — M9904 Segmental and somatic dysfunction of sacral region: Secondary | ICD-10-CM | POA: Diagnosis not present

## 2024-02-01 DIAGNOSIS — M51361 Other intervertebral disc degeneration, lumbar region with lower extremity pain only: Secondary | ICD-10-CM | POA: Diagnosis not present

## 2024-02-01 DIAGNOSIS — M9905 Segmental and somatic dysfunction of pelvic region: Secondary | ICD-10-CM | POA: Diagnosis not present

## 2024-02-01 DIAGNOSIS — M9903 Segmental and somatic dysfunction of lumbar region: Secondary | ICD-10-CM | POA: Diagnosis not present

## 2024-02-01 DIAGNOSIS — M9904 Segmental and somatic dysfunction of sacral region: Secondary | ICD-10-CM | POA: Diagnosis not present

## 2024-02-05 DIAGNOSIS — M51361 Other intervertebral disc degeneration, lumbar region with lower extremity pain only: Secondary | ICD-10-CM | POA: Diagnosis not present

## 2024-02-05 DIAGNOSIS — M9903 Segmental and somatic dysfunction of lumbar region: Secondary | ICD-10-CM | POA: Diagnosis not present

## 2024-02-05 DIAGNOSIS — M9904 Segmental and somatic dysfunction of sacral region: Secondary | ICD-10-CM | POA: Diagnosis not present

## 2024-02-05 DIAGNOSIS — M9905 Segmental and somatic dysfunction of pelvic region: Secondary | ICD-10-CM | POA: Diagnosis not present

## 2024-02-12 DIAGNOSIS — M9904 Segmental and somatic dysfunction of sacral region: Secondary | ICD-10-CM | POA: Diagnosis not present

## 2024-02-12 DIAGNOSIS — M9905 Segmental and somatic dysfunction of pelvic region: Secondary | ICD-10-CM | POA: Diagnosis not present

## 2024-02-12 DIAGNOSIS — M9903 Segmental and somatic dysfunction of lumbar region: Secondary | ICD-10-CM | POA: Diagnosis not present

## 2024-02-12 DIAGNOSIS — M51361 Other intervertebral disc degeneration, lumbar region with lower extremity pain only: Secondary | ICD-10-CM | POA: Diagnosis not present

## 2024-02-15 DIAGNOSIS — M9905 Segmental and somatic dysfunction of pelvic region: Secondary | ICD-10-CM | POA: Diagnosis not present

## 2024-02-15 DIAGNOSIS — M9903 Segmental and somatic dysfunction of lumbar region: Secondary | ICD-10-CM | POA: Diagnosis not present

## 2024-02-15 DIAGNOSIS — M9904 Segmental and somatic dysfunction of sacral region: Secondary | ICD-10-CM | POA: Diagnosis not present

## 2024-02-19 DIAGNOSIS — M9904 Segmental and somatic dysfunction of sacral region: Secondary | ICD-10-CM | POA: Diagnosis not present

## 2024-02-19 DIAGNOSIS — M9903 Segmental and somatic dysfunction of lumbar region: Secondary | ICD-10-CM | POA: Diagnosis not present

## 2024-02-19 DIAGNOSIS — M9905 Segmental and somatic dysfunction of pelvic region: Secondary | ICD-10-CM | POA: Diagnosis not present

## 2024-02-21 DIAGNOSIS — M9903 Segmental and somatic dysfunction of lumbar region: Secondary | ICD-10-CM | POA: Diagnosis not present

## 2024-02-21 DIAGNOSIS — M9904 Segmental and somatic dysfunction of sacral region: Secondary | ICD-10-CM | POA: Diagnosis not present

## 2024-02-21 DIAGNOSIS — M9905 Segmental and somatic dysfunction of pelvic region: Secondary | ICD-10-CM | POA: Diagnosis not present

## 2024-02-26 DIAGNOSIS — M9905 Segmental and somatic dysfunction of pelvic region: Secondary | ICD-10-CM | POA: Diagnosis not present

## 2024-02-26 DIAGNOSIS — M9903 Segmental and somatic dysfunction of lumbar region: Secondary | ICD-10-CM | POA: Diagnosis not present

## 2024-02-26 DIAGNOSIS — M51361 Other intervertebral disc degeneration, lumbar region with lower extremity pain only: Secondary | ICD-10-CM | POA: Diagnosis not present

## 2024-02-26 DIAGNOSIS — M9904 Segmental and somatic dysfunction of sacral region: Secondary | ICD-10-CM | POA: Diagnosis not present

## 2024-03-04 DIAGNOSIS — M9904 Segmental and somatic dysfunction of sacral region: Secondary | ICD-10-CM | POA: Diagnosis not present

## 2024-03-04 DIAGNOSIS — M51361 Other intervertebral disc degeneration, lumbar region with lower extremity pain only: Secondary | ICD-10-CM | POA: Diagnosis not present

## 2024-03-04 DIAGNOSIS — M9905 Segmental and somatic dysfunction of pelvic region: Secondary | ICD-10-CM | POA: Diagnosis not present

## 2024-03-04 DIAGNOSIS — M9903 Segmental and somatic dysfunction of lumbar region: Secondary | ICD-10-CM | POA: Diagnosis not present
